# Patient Record
Sex: Female | Born: 1935
Health system: Southern US, Community
[De-identification: ages and names within clinical notes are randomized; demographics above are authoritative.]

## PROBLEM LIST (undated history)

## (undated) DIAGNOSIS — J449 Chronic obstructive pulmonary disease, unspecified: Secondary | ICD-10-CM

## (undated) DIAGNOSIS — I10 Essential (primary) hypertension: Secondary | ICD-10-CM

## (undated) DIAGNOSIS — F32A Depression, unspecified: Secondary | ICD-10-CM

## (undated) DIAGNOSIS — F329 Major depressive disorder, single episode, unspecified: Secondary | ICD-10-CM

## (undated) HISTORY — PX: VAGINAL HYSTERECTOMY: SUR661

---
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
## 1998-12-30 ENCOUNTER — Other Ambulatory Visit: Admission: RE | Admit: 1998-12-30 | Discharge: 1998-12-30 | Payer: Self-pay | Admitting: *Deleted

## 2000-01-08 ENCOUNTER — Encounter: Payer: Self-pay | Admitting: Family Medicine

## 2000-01-08 ENCOUNTER — Ambulatory Visit (HOSPITAL_COMMUNITY): Admission: RE | Admit: 2000-01-08 | Discharge: 2000-01-08 | Payer: Self-pay | Admitting: Family Medicine

## 2002-07-25 ENCOUNTER — Other Ambulatory Visit: Admission: RE | Admit: 2002-07-25 | Discharge: 2002-07-25 | Payer: Self-pay | Admitting: Family Medicine

## 2002-08-07 ENCOUNTER — Ambulatory Visit (HOSPITAL_COMMUNITY): Admission: RE | Admit: 2002-08-07 | Discharge: 2002-08-07 | Payer: Self-pay | Admitting: Family Medicine

## 2002-08-07 ENCOUNTER — Encounter: Payer: Self-pay | Admitting: Family Medicine

## 2004-08-10 ENCOUNTER — Ambulatory Visit (HOSPITAL_COMMUNITY): Admission: RE | Admit: 2004-08-10 | Discharge: 2004-08-10 | Payer: Self-pay | Admitting: Family Medicine

## 2005-01-04 ENCOUNTER — Ambulatory Visit (HOSPITAL_COMMUNITY): Admission: RE | Admit: 2005-01-04 | Discharge: 2005-01-04 | Payer: Self-pay | Admitting: Family Medicine

## 2005-05-30 ENCOUNTER — Other Ambulatory Visit: Admission: RE | Admit: 2005-05-30 | Discharge: 2005-05-30 | Payer: Self-pay | Admitting: Family Medicine

## 2008-03-27 ENCOUNTER — Emergency Department (HOSPITAL_COMMUNITY): Admission: EM | Admit: 2008-03-27 | Discharge: 2008-03-28 | Payer: Self-pay | Admitting: Emergency Medicine

## 2009-10-20 ENCOUNTER — Ambulatory Visit (HOSPITAL_COMMUNITY): Admission: RE | Admit: 2009-10-20 | Discharge: 2009-10-20 | Payer: Self-pay | Admitting: Orthopedic Surgery

## 2014-05-28 ENCOUNTER — Other Ambulatory Visit: Payer: Self-pay

## 2017-01-07 ENCOUNTER — Other Ambulatory Visit: Payer: Self-pay

## 2017-01-07 ENCOUNTER — Emergency Department (HOSPITAL_COMMUNITY)
Admission: EM | Admit: 2017-01-07 | Discharge: 2017-01-07 | Disposition: A | Discharge status: Home / Self Care | Payer: Medicare Other | Attending: Emergency Medicine | Admitting: Emergency Medicine

## 2017-01-07 ENCOUNTER — Emergency Department (HOSPITAL_COMMUNITY): Payer: Medicare Other

## 2017-01-07 ENCOUNTER — Encounter (HOSPITAL_COMMUNITY): Payer: Self-pay

## 2017-01-07 DIAGNOSIS — R079 Chest pain, unspecified: Secondary | ICD-10-CM

## 2017-01-07 DIAGNOSIS — Z87891 Personal history of nicotine dependence: Secondary | ICD-10-CM | POA: Insufficient documentation

## 2017-01-07 DIAGNOSIS — R059 Cough, unspecified: Secondary | ICD-10-CM

## 2017-01-07 DIAGNOSIS — R072 Precordial pain: Secondary | ICD-10-CM | POA: Insufficient documentation

## 2017-01-07 DIAGNOSIS — R0602 Shortness of breath: Secondary | ICD-10-CM | POA: Insufficient documentation

## 2017-01-07 DIAGNOSIS — R05 Cough: Secondary | ICD-10-CM | POA: Diagnosis not present

## 2017-01-07 DIAGNOSIS — Z79899 Other long term (current) drug therapy: Secondary | ICD-10-CM | POA: Diagnosis not present

## 2017-01-07 LAB — BASIC METABOLIC PANEL
ANION GAP: 6 (ref 5–15)
BUN: 15 mg/dL (ref 6–20)
CALCIUM: 10.2 mg/dL (ref 8.9–10.3)
CO2: 31 mmol/L (ref 22–32)
CREATININE: 0.59 mg/dL (ref 0.44–1.00)
Chloride: 104 mmol/L (ref 101–111)
GFR calc Af Amer: >60 mL/min (ref >60)
GLUCOSE: 102 mg/dL — AB (ref 65–99)
Potassium: 3.5 mmol/L (ref 3.5–5.1)
Sodium: 141 mmol/L (ref 135–145)

## 2017-01-07 LAB — CBC
HCT: 38.3 % (ref 36.0–46.0)
HEMOGLOBIN: 11.8 g/dL — AB (ref 12.0–15.0)
MCH: 27.3 pg (ref 26.0–34.0)
MCHC: 30.8 g/dL (ref 30.0–36.0)
MCV: 88.7 fL (ref 78.0–100.0)
Platelets: 293 10*3/uL (ref 150–400)
RBC: 4.32 MIL/uL (ref 3.87–5.11)
RDW: 13.8 % (ref 11.5–15.5)
WBC: 5.6 10*3/uL (ref 4.0–10.5)

## 2017-01-07 LAB — I-STAT TROPONIN, ED: TROPONIN I, POC: 0 ng/mL (ref 0.00–0.08)

## 2017-01-07 LAB — BRAIN NATRIURETIC PEPTIDE: B Natriuretic Peptide: 30.3 pg/mL (ref 0.0–100.0)

## 2017-01-07 MED ORDER — BENZONATATE 100 MG PO CAPS: 100.0000 mg | ORAL_CAPSULE | Freq: Three times a day (TID) | ORAL | 0 refills | Status: DC

## 2017-01-07 NOTE — ED Notes (Signed)
Patient transported to X-ray 

## 2017-01-07 NOTE — ED Triage Notes (Signed)
Pt. Has had  Non-productive cough and nasal congestion for over 1 week.   The last 2 days she has developed chest soreness and sob with exertion.  She denies any n/v/d.  Skin is warm and dry.  Pt. Denies any chills or fever   Lungs are diminished.   Pt. Is alert and oriented X4.  Pt. Is taking OTC medications not helping

## 2017-01-07 NOTE — Discharge Instructions (Signed)
May use tessalon for cough if needed. Follow-up with your primary care doctor. Return here for new or worsening symptoms including worsening pain, shortness of breath, high fever, weakness, etc.

## 2017-01-07 NOTE — ED Notes (Signed)
PA at bedside.

## 2017-01-07 NOTE — ED Provider Notes (Signed)
[facility] DEPT Provider Note   CSN: [id number] Arrival date & time: 01/07/17  1023     History   Chief Complaint Chief Complaint  Patient presents with  . Cough  . Chest Pain  . Shortness of Breath    HPI Chloe Wilcox is a 82 y.o. female.  The history is provided by the patient and medical records.  Cough  Associated symptoms include chest pain and shortness of breath.  Chest Pain   Associated symptoms include cough and shortness of breath.  Shortness of Breath  Associated symptoms include cough and chest pain.    82 year old female with history of hypertension, depression, presenting to the ED for cough and nasal congestion.  Patient reports this began about 1 week ago. States cough is productive first thing in the morning, but remains dry throughout the remainder of the day. She denies any nausea, vomiting, or posttussive emesis. No diarrhea. States over the past 2 days her chest has become "sore". States this is worse in the midsternal region without noted radiation. She denies any palpitations, dizziness, or weakness. Does report some mild shortness of breath with exertion. She's not had any falls or syncopal episodes. She remains ambulatory with her cane which is baseline. She's not had any sick contacts. She did receive a flu vaccination this year. She has been taking Coricidin cold and flu as well as Coricidin cough medication as to not increase her blood pressure.  Patient denies any weakness. States she has been able to eat and drink normally. States otherwise she feels fairly well.  She is a former smoker. No known cardiac disease.  Denies fever, chills, sweats.  VSS.  History reviewed. No pertinent past medical history.  There are no active problems to display for this patient.   History reviewed. No pertinent surgical history.  OB History    No data available       Home Medications    Prior to Admission medications   Medication Sig Start Date End Date  Taking? Authorizing Provider  cetirizine (ZYRTEC) 10 MG tablet Take 10 mg by mouth daily.   Yes Historical Provider, MD  citalopram (CELEXA) 20 MG tablet Take 20 mg by mouth daily.   Yes Historical Provider, MD  diclofenac (VOLTAREN) 75 MG EC tablet Take 75 mg by mouth 2 (two) times daily.   Yes Historical Provider, MD  lisinopril-hydrochlorothiazide (PRINZIDE,ZESTORETIC) 20-25 MG tablet Take 1 tablet by mouth daily.   Yes Historical Provider, MD  Multiple Vitamin (MULTIVITAMIN) tablet Take 1 tablet by mouth daily.   Yes Historical Provider, MD  oxyCODONE-acetaminophen (PERCOCET/ROXICET) 5-325 MG tablet Take 1 tablet by mouth 2 (two) times daily as needed for moderate pain.  01/02/17  Yes Historical Provider, MD  potassium chloride (MICRO-K) 10 MEQ CR capsule Take 10 mEq by mouth daily.   Yes Historical Provider, MD  tiZANidine (ZANAFLEX) 2 MG tablet Take 2 mg by mouth every 6 (six) hours as needed for muscle spasms.   Yes Historical Provider, MD  traMADol (ULTRAM) 50 MG tablet Take 50 mg by mouth daily as needed for moderate pain.   Yes Historical Provider, MD    Family History No family history on file.  Social History Social History  Substance Use Topics  . Smoking status: Former Research scientist (life sciences)  . Smokeless tobacco: Never Used  . Alcohol use No     Allergies   Patient has no known allergies.   Review of Systems Review of Systems  HENT: Positive for congestion.  Respiratory: Positive for cough and shortness of breath.   Cardiovascular: Positive for chest pain.  All other systems reviewed and are negative.    Physical Exam Updated Vital Signs BP 174/73 (BP Location: Right Arm)   Pulse 82   Temp 99.1 F (37.3 C) (Oral)   Resp 19   Ht 5\' 5"  (1.651 m)   Wt 73.9 kg   SpO2 95%   BMI 27.12 kg/m   Physical Exam  Constitutional: She is oriented to person, place, and time. She appears well-developed and well-nourished.  Appears well, interactive during exam, no distress  HENT:    Head: Normocephalic and atraumatic.  Right Ear: Tympanic membrane and ear canal normal.  Left Ear: Tympanic membrane and ear canal normal.  Nose: Mucosal edema present.  Mouth/Throat: Uvula is midline, oropharynx is clear and moist and mucous membranes are normal.  + nasal congestion with PND  Eyes: Conjunctivae and EOM are normal. Pupils are equal, round, and reactive to light.  Right upper eyelid ptosis, baseline  Neck: Normal range of motion.  Cardiovascular: Normal rate, regular rhythm and normal heart sounds.   Pulmonary/Chest: Effort normal and breath sounds normal. She has no wheezes. She has no rhonchi. She has no rales.  Lung sounds overall clear, no distress, speaking in full sentences without difficulty  Abdominal: Soft. Bowel sounds are normal.  Musculoskeletal: Normal range of motion.  Neurological: She is alert and oriented to person, place, and time.  Skin: Skin is warm and dry.  Psychiatric: She has a normal mood and affect.  Nursing note and vitals reviewed.    ED Treatments / Results  Labs (all labs ordered are listed, but only abnormal results are displayed) Labs Reviewed  BASIC METABOLIC PANEL - Abnormal; Notable for the following:       Result Value   Glucose, Bld 102 (*)    All other components within normal limits  CBC - Abnormal; Notable for the following:    Hemoglobin 11.8 (*)    All other components within normal limits  BRAIN NATRIURETIC PEPTIDE  I-STAT TROPOININ, ED    EKG  EKG Interpretation  Date/Time:  Saturday January 07 2017 11:32:16 EST Ventricular Rate:  74 PR Interval:  166 QRS Duration: 82 QT Interval:  374 QTC Calculation: 415 R Axis:   44 Text Interpretation:  Normal sinus rhythm Nonspecific T wave abnormality Abnormal ECG No old tracing to compare Confirmed by [facility]  MD, MARTHA [phone]) on 01/07/2017 11:53:02 AM       Radiology Dg Chest 2 View  Result Date: 01/07/2017 CLINICAL DATA:  Center chest pain/discomfort x3 days.  Dry cough x1 week. Hx of HTN. Ex-smoker, quit x30 years ago. EXAM: CHEST - 2 VIEW COMPARISON:  none FINDINGS: Lungs are clear.  No pneumothorax. Heart size and mediastinal contours are within normal limits. Atheromatous aorta. No effusion.  Focal diaphragmatic eventrations. Visualized bones unremarkable. IMPRESSION: No acute cardiopulmonary disease. Electronically Signed   By: Lucrezia Europe M.D.   On: 01/07/2017 12:25    Procedures Procedures (including critical care time)  Medications Ordered in ED Medications - No data to display   Initial Impression / Assessment and Plan / ED Course  I have reviewed the triage vital signs and the nursing notes.  Pertinent labs & imaging results that were available during my care of the patient were reviewed by me and considered in my medical decision making (see chart for details).  82 y.o. F here with Cough, congestion x 1 weeks with  new, constant chest "soreness" x 2 days.  She is afebrile and nontoxic. She is in no acute distress on exam. She does have some tenderness of the chest wall along the midsternal region. There is no gross deformity. Lungs are clear without wheezes or rhonchi.  EKG without acute ischemic changes.  Lab work reassuring.  CXR clear.  Patient has remained stable here.  Symptoms sound more related to URI.  Given 2 days of ongoing chest "soreness" with negative enzymes, low suspicion for ACS, PE, dissection, or other acute cardiac event.  Will plan to discharge home with symptomatic care.  Close PCP follow-up encouraged.  Discussed plan with patient and daughter, they both acknowledged understanding and agreed with plan of care.  Return precautions given for new or worsening symptoms.  Case discussed with attending physician, Dr. Canary Brim, who evaluated patient and agrees with assessment and plan of care.  Final Clinical Impressions(s) / ED Diagnoses   Final diagnoses:  Cough  Chest pain, unspecified type    New Prescriptions New  Prescriptions   BENZONATATE (TESSALON) 100 MG CAPSULE    Take 1 capsule (100 mg total) by mouth every 8 (eight) hours.     Larene Pickett, PA-C 01/07/17 [place], MD 01/07/17 [phone]

## 2017-07-25 ENCOUNTER — Other Ambulatory Visit: Payer: Self-pay | Admitting: Family Medicine

## 2017-07-25 DIAGNOSIS — R198 Other specified symptoms and signs involving the digestive system and abdomen: Secondary | ICD-10-CM

## 2017-08-02 ENCOUNTER — Ambulatory Visit
Admission: RE | Admit: 2017-08-02 | Discharge: 2017-08-02 | Disposition: A | Discharge status: Home / Self Care | Payer: Medicare Other | Source: Ambulatory Visit | Attending: Family Medicine | Admitting: Family Medicine

## 2017-08-02 DIAGNOSIS — R198 Other specified symptoms and signs involving the digestive system and abdomen: Secondary | ICD-10-CM

## 2017-08-02 MED ORDER — IOPAMIDOL (ISOVUE-300) INJECTION 61%
100.0000 mL | Freq: Once | INTRAVENOUS | Status: AC | PRN
  Administered 2017-08-02: 100 mL via INTRAVENOUS

## 2018-06-06 ENCOUNTER — Other Ambulatory Visit: Payer: Self-pay | Admitting: Family Medicine

## 2018-06-06 ENCOUNTER — Ambulatory Visit
Admission: RE | Admit: 2018-06-06 | Discharge: 2018-06-06 | Disposition: A | Discharge status: Home / Self Care | Payer: Medicare Other | Source: Ambulatory Visit | Attending: Family Medicine | Admitting: Family Medicine

## 2018-06-06 DIAGNOSIS — R0602 Shortness of breath: Secondary | ICD-10-CM

## 2018-08-09 ENCOUNTER — Ambulatory Visit: Payer: Medicare Other | Admitting: Physical Therapy

## 2018-08-09 ENCOUNTER — Encounter: Payer: Self-pay | Admitting: Physical Therapy

## 2018-08-09 ENCOUNTER — Other Ambulatory Visit: Payer: Self-pay

## 2018-08-09 ENCOUNTER — Ambulatory Visit: Payer: Medicare Other | Attending: Family Medicine | Admitting: Speech Pathology

## 2018-08-09 DIAGNOSIS — R2681 Unsteadiness on feet: Secondary | ICD-10-CM | POA: Insufficient documentation

## 2018-08-09 DIAGNOSIS — R41841 Cognitive communication deficit: Secondary | ICD-10-CM | POA: Diagnosis present

## 2018-08-09 DIAGNOSIS — R2689 Other abnormalities of gait and mobility: Secondary | ICD-10-CM

## 2018-08-09 DIAGNOSIS — M6281 Muscle weakness (generalized): Secondary | ICD-10-CM

## 2018-08-09 DIAGNOSIS — R4701 Aphasia: Secondary | ICD-10-CM | POA: Diagnosis present

## 2018-08-09 DIAGNOSIS — R471 Dysarthria and anarthria: Secondary | ICD-10-CM | POA: Diagnosis present

## 2018-08-09 NOTE — Patient Instructions (Signed)
Abdominal Breathing   . Lie down flat on your bed (you can use a pillow if you need to. The goal is for you to be relaxed) . Put something lightweight on your belly (a paperback book, a paper cup, a box of tissues, etc) - this helps you focus on easy abdominal breath support - the best and most relaxed way to breathe . Breathe naturally and pay attention to how your belly rises as you breathe in and your lungs fill with air Breathe out through your mouth and watch your belly move in/down as you let the air go.  Think of your belly as a balloon, when you fill with air (inhale), the balloon gets bigger. As the air goes out (exhale), the balloon deflates.  Spend 10-15 minutes each day watching your breathing while you are in this position.  Later, we will work on using your breathing to make your voice stronger.    Please bring the exercises from your speech therapist at the hospital with you the next time you come.

## 2018-08-09 NOTE — Therapy (Signed)
[facility] [address] [place], [place], [address] Phone: [phone]   Fax:  [phone]  Speech Language Pathology Evaluation  Patient Details  Name: Chloe Wilcox MRN: [id number] Date of Birth: Jan 22, 1935 Referring Provider: [name]   Encounter Date: 08/09/2018  End of Session - 08/09/18 1311    Visit Number  1    Number of Visits  9    Date for SLP Re-Evaluation  09/27/18    SLP Start Time  25    SLP Stop Time   1100    SLP Time Calculation (min)  41 min    Activity Tolerance  Patient tolerated treatment well       No past medical history on file.  No past surgical history on file.  There were no vitals filed for this visit.  Subjective Assessment - 08/09/18 1022    Subjective  "I notice I talk a little different... It's a little sl-ll-slo- sloler, I'm trying to say slower."    Patient is accompained by:  Family member   daughter [name]   Currently in Pain?  No/denies         SLP Evaluation [facility] - 08/09/18 1022      SLP Visit Information   SLP Received On  08/09/18    Referring Provider  Shirline Frees K    Onset Date  07/22/18    Medical Diagnosis  CVA      Subjective   Subjective  alert, cooperative    Patient/Family Stated Goal  for people to understand what i'm trying to say      General Information   HPI  83 y.o. female, hx of right eye ptosis, admitted to hospital in [place], [place] for CVA 07/22/18-07/25/18. MRI results not available. Pt with resultant right sided weakness and reportedly drooling, speech difficulties.     Behavioral/Cognition  alert, cooperative      Balance Screen   Has the patient fallen in the past 6 months  --   See today's PT eval     Prior Functional Status   Cognitive/Linguistic Baseline  Baseline deficits   mild cognitive deficits, unchanged per daughter   Type of [place] With  Daughter    Available Support  Family    Vocation   Retired      Associate Professor   Overall Cognitive Status  History of cognitive impairments - at baseline      Auditory Comprehension   Overall Auditory Comprehension  Appears within functional limits for tasks assessed    Yes/No Questions  Within Functional Limits    Commands  Within Functional Limits    Conversation  Moderately complex    Interfering Components  Hearing      Visual Recognition/Discrimination   Discrimination  Not tested      Reading Comprehension   Reading Status  Within funtional limits   sentences, simple paragraph (9th grade education)     Expression   Primary Mode of Expression  Verbal      Verbal Expression   Overall Verbal Expression  Impaired at baseline   circumlocution with naming tasks, none in conversation     Written Expression   Dominant Hand  Right    Written Expression  Not tested      Oral Motor/Sensory Function   Overall Oral Motor/Sensory Function  Impaired    Labial ROM  Reduced right    Labial Symmetry  Abnormal symmetry right  Labial Strength  Within Functional Limits    Labial Sensation  Within Functional Limits    Labial Coordination  WFL    Lingual ROM  Within Functional Limits    Lingual Symmetry  Within Functional Limits    Lingual Strength  Within Functional Limits    Lingual Coordination  WFL    Facial ROM  Reduced right    Facial Symmetry  Right droop    Facial Strength  Within Functional Limits    Facial Sensation  Within Functional Limits    Velum  Within Functional Limits    Mandible  Within Functional Limits      Motor Speech   Overall Motor Speech  Impaired    Respiration  Impaired    Level of Impairment  Word    Phonation  Hoarse;Low vocal intensity;Other (comment)   gravelly, rough, diplophonia   Resonance  Within functional limits    Articulation  Impaired    Level of Impairment  Conversation    Intelligibility  Intelligible    Motor Planning  Impaired    Level of Impairment  Sentence    Motor Speech Errors   Groping for words;Inconsistent    Effective Techniques  Slow rate    Phonation  Impaired    Volume  Soft    Pitch  Low      Standardized Assessments   Standardized Assessments   Other Assessment    Other Assessment  Sustained /a/ reduced at average 4 seconds duration, with pitch breaks/diplophonia noted.                       SLP Education - 08/09/18 1311    Education Details  abdominal breathing, proposed therapy goals    Person(s) Educated  Patient;Child(ren)    Methods  Explanation;Demonstration;Verbal cues;Handout    Comprehension  Verbalized understanding;Returned demonstration;Verbal cues required;Tactile cues required;Need further instruction         SLP Long Term Goals - 08/09/18 1328      SLP LONG TERM GOAL #1   Title  Pt will demo abdominal breathing at rest 80% accuracy, rare min A.     Baseline  Max A in tandem breathing     Time  4    Period  Weeks    Status  New      SLP LONG TERM GOAL #2   Title  Pt will use abdominal breathing 80% accuracy when responding with sentences 18/20 over 3 sessions.    Time  4    Period  Weeks    Status  New      SLP LONG TERM GOAL #3   Title  Pt will use abdominal breathing in 7 min simple conversation to reduce hoarse vocal quality, 80% accuracy with occasional min A.     Time  4    Period  Weeks      SLP LONG TERM GOAL #4   Title  Pt/caregiver will report fewer requests for pt to repeat herself.     Time  4    Period  Weeks    Status  New       Plan - 08/09/18 1313    Clinical Impression Statement  Patient presents with moderate dysarthria characterized by hoarse, gravelly vocal quality with intermittent pitch breaks, diplophonia and mild slurring. Pt's intelligibility is impacted primarily by her vocal quality and decreased vocal intensity, which was difficult to measure accurately given louder, high-pitched voice breaks. She has mild right facial  weakness, and reports some drooling but feels this has  mostly resolved. Intelligibility was not affected in this quiet environment, however pt reports decreased quality of life, stating others cannot understand her when she speaks. Her breathing pattern is shallow, with chest and clavicular-centered respiration. SLP attempted intensity-based cuing to determine if this would improve pt's vocal quality, however this resulted in strained-harsh vocal quality. Question mild verbal apraxia as pt was noted with articulatory breakdown/groping attempts x3 in utterances of longer complexity (see "S"). Testing for non-verbal oral apraxia revealed normal movements. Pt's daughter present and denies changes in pt's language or cognitive abilities; they are most concerned about her vocal quality and would like to target this in therapy. I suspect mild baseline cognitive deficits; confrontation naming is mildly impaired (circumlocution), though there are no wordfinding difficulties in conversation. I recommend skilled ST to address dysarthria in order to improve pt's vocal quality, ability to communicate at home and in the community.     Speech Therapy Frequency  2x / week    Duration  4 weeks    Treatment/Interventions  Compensatory strategies;Oral motor exercises;SLP instruction and feedback;Patient/family education;Functional tasks;Cueing hierarchy;Compensatory techniques    Potential to Achieve Goals  Fair    Potential Considerations  Severity of impairments;Ability to learn/carryover information;Financial resources   pt wishes to schedule only 4 weeks at this time d/t financial concerns   SLP Home Exercise Plan  abdominal breathing provided. pt to bring exercises given by hospital ST to next session    Consulted and Agree with Plan of Care  Patient;Family member/caregiver       Patient will benefit from skilled therapeutic intervention in order to improve the following deficits and impairments:   Dysarthria and anarthria  Aphasia  Cognitive communication  deficit    Problem List There are no active problems to display for this patient.  Deneise Lever, [place], CCC-SLP Speech-Language Pathologist  Aliene Altes 08/09/2018, 1:29 PM  [facility] [address] [place], [place], [address] Phone: [phone]   Fax:  [phone]  Name: Chloe Wilcox MRN: [id number] Date of Birth: [birth year]

## 2018-08-10 ENCOUNTER — Ambulatory Visit: Payer: Medicare Other | Admitting: *Deleted

## 2018-08-10 ENCOUNTER — Encounter: Payer: Self-pay | Admitting: Physical Therapy

## 2018-08-10 NOTE — Therapy (Signed)
[facility] [address] [place], [place], [address] Phone: [phone]   Fax:  [phone]  Physical Therapy Evaluation  Patient Details  Name: RONEE RANGANATHAN MRN: [id number] Date of Birth: Apr 20, 1978 Referring Provider: Shirline Frees, MD   Encounter Date: 08/09/2018  PT End of Session - 08/10/18 0812    Visit Number  1    Number of Visits  9    Date for PT Re-Evaluation  10/08/18    Authorization Type  [employer] Medicare-will need 10th visit progress note    PT Start Time  0936    PT Stop Time  1017    PT Time Calculation (min)  41 min    Activity Tolerance  Patient tolerated treatment well    Behavior During Therapy  [facility] for tasks assessed/performed       History reviewed. No pertinent past medical history.  History reviewed. No pertinent surgical history.  There were no vitals filed for this visit.   Subjective Assessment - 08/09/18 0941    Subjective  Pt was visiting in [place] with sister, and two weeks ago-realized she wasn't walking right and talking funny.  She went to hospital, and had CVA with R sided weakness.  She feels like she has made improvement.  She has rollator, which she uses primarily for long distance community walking; otherwise, she was using cane.  Daughter feels she's about 65% to normal..    Patient is accompained by:  Family member   Daughter   Pertinent History  PMH on HTN, OA, CVA 07/22/18    Patient Stated Goals  Pt's goals for therapy are to get less stiffness, get stiff back and build strength in R side.  Daughter wants her to continue to be as mobile as possible.    Currently in Pain?  No/denies         [facility] PT Assessment - 08/09/18 0948      Assessment   Medical Diagnosis  CVA    Referring Provider  Shirline Frees, MD    Onset Date/Surgical Date  07/22/18    Hand Dominance  Left      Precautions   Precautions  Fall      Balance Screen   Has the patient fallen in  the past 6 months  No    Has the patient had a decrease in activity level because of a fear of falling?   Yes    Is the patient reluctant to leave their home because of a fear of falling?   Yes      [place]  Private residence    Living Arrangements  Children    Available Help at Discharge  Family    Type of [place] to enter    Entrance Stairs-Number of Steps  [place]  One level    [place] - 4 wheels;Kasandra Knudsen - single point      Prior Function   Level of Independence  Independent    Leisure  Enjoys shopping, baking, adult coloring books      Observation/Other Assessments   Focus on Therapeutic Outcomes (FOTO)   Staff did not capture-NP      Sensation   Light Touch  Appears Intact      Posture/Postural Control   Posture/Postural Control  Postural limitations    Postural Limitations  Rounded Shoulders      ROM / Strength   AROM / PROM / Strength  Strength      Strength   Overall Strength  Deficits    Overall Strength Comments  Grossly tested 4/5 thorughout LLE, 3+/5 through RLE      Transfers   Transfers  Sit to Stand;Stand to Sit    Sit to Stand  6: Modified independent (Device/Increase time);With upper extremity assist;From chair/3-in-1    Stand to Sit  6: Modified independent (Device/Increase time);With upper extremity assist;To chair/3-in-1      Ambulation/Gait   Ambulation/Gait  Yes    Ambulation/Gait Assistance  5: Supervision    Ambulation/Gait Assistance Details  PT lowered pt's rollator walker, as it was significantly too high for her.  Patient tends to leave rollator and walk 2-3 ft to sit in chair.  She does not lock brakes to rollator (unaware of how to properly use rollator)    Ambulation Distance (Feet)  80 Feet    Assistive device  4-wheeled walker    Gait Pattern  Step-through pattern;Trunk flexed;Decreased step length - right;Decreased step  length - left    Ambulation Surface  Level;Indoor    Gait velocity  21.12 sec = 1.55 ft/sec      Standardized Balance Assessment   Standardized Balance Assessment  Timed Up and Go Test      Timed Up and Go Test   Normal TUG (seconds)  35.22    TUG Comments  Scores > 13.5 seconds indicate increased fall risk; >30 seconds indicate difficulty with ADLs in the home.                Objective measurements completed on examination: See above findings.              PT Education - 08/10/18 0811    Education Details  Lowered rollator to more appropriate height for patient; instructed patient/daughter in proper use of rollator for turning to sit, for locking and unlocking brakes    Person(s) Educated  Patient;Child(ren)    Methods  Explanation;Demonstration;Handout    Comprehension  Verbalized understanding;Returned demonstration;Verbal cues required          PT Long Term Goals - 08/10/18 8341      PT LONG TERM GOAL #1   Title  Pt will perform HEP with family's supervision, for improved balance, strength, and functional mobility.  TARGET 09/07/18    Time  5    Period  Weeks    Status  New    Target Date  09/07/18      PT LONG TERM GOAL #2   Title  Pt will improve TUG score to less than or equal to 25 seconds for decreased fall risk.    Time  5    Period  Weeks    Status  New    Target Date  09/07/18      PT LONG TERM GOAL #3   Title  Pt will improve gait velocity to at least 1.8 ft/sec for decreased fall risk, improved gait efficiency.    Time  5    Period  Weeks    Status  New    Target Date  09/07/18      PT LONG TERM GOAL #4   Title  Berg Balance to be assessed, with goal to be written as appropriate.    Time  5    Period  Weeks    Status  New    Target Date  09/07/18      PT LONG TERM GOAL #5   Title  Pt will ambulate at least 500 ft using rollator, modified independnetly, for improved community distance gait.    Time  5    Period  Weeks     Status  New    Target Date  09/07/18      Additional Long Term Goals   Additional Long Term Goals  Yes      PT LONG TERM GOAL #6   Title  Pt will ambulate at least 150 ft using single point cane, modified independently, for improved gait for household distances.    Time  5    Period  Weeks    Status  New    Target Date  09/07/18             Plan - 08/10/18 0813    Clinical Impression Statement  Pt is an 83 year old female who presents to OP PT, with recent history of CVA 07/22/18 when she was visiting family in [place].  Pt presents with R sided weakness, speech issues, decreased balance, slowed mobility, risk of falls per TUG and gait velocity scores.  She is currently using rollator walker (which she had at home, but never used), and she was using cane prior to CVA.  She enjoys shopping and baking.  Pt would benefit from skilled PT to address the above stated deficits to decrease fall risk, to improve mobility and independence to prior level of function.    History and Personal Factors relevant to plan of care:  PMH includes HTN, OA, drooping of eyelid (to require surgery per pt report); recent CVA with muscle weakness, decreased balance, decreased independence with mobility    Clinical Presentation  Evolving    Clinical Presentation due to:  Recent CVA with R sided weakness, at fall risk per TUG and gait velocity scores    Clinical Decision Making  Moderate    Rehab Potential  Good    PT Frequency  2x / week    PT Duration  4 weeks   plus eval visit   PT Treatment/Interventions  ADLs/Self Care Home Management;DME Instruction;Gait training;Functional mobility training;Therapeutic activities;Therapeutic exercise;Balance training;Neuromuscular re-education;Patient/family education    PT Next Visit Plan  Perform Berg and add to LTG (not able to do during eval, due to education on walker safety); initiate HEP for strength, balance    Recommended Other Services  Discussed recommendation for  OT to address RUE weakness and coordination; pt/daughter did not yet decide about OT    Consulted and Agree with Plan of Care  Patient;Family member/caregiver    Family Member Consulted  daughter       Patient will benefit from skilled therapeutic intervention in order to improve the following deficits and impairments:  Abnormal gait, Decreased balance, Decreased knowledge of use of DME, Decreased mobility, Difficulty walking, Decreased strength, Decreased safety awareness  Visit Diagnosis: Other abnormalities of gait and mobility  Muscle weakness (generalized)  Unsteadiness on feet     Problem List There are no active problems to display for this patient.   Mashelle Busick W. 08/10/2018, 8:30 AM Frazier Butt., PT  [facility] [address] [place], [place], [address] Phone: [phone]   Fax:  [phone]  Name: SHAMA MONFILS MRN: [id number] Date of Birth: July 26, 1935

## 2018-08-14 ENCOUNTER — Encounter: Payer: Self-pay | Admitting: Physical Therapy

## 2018-08-14 ENCOUNTER — Encounter: Payer: Self-pay | Admitting: Speech Pathology

## 2018-08-14 ENCOUNTER — Ambulatory Visit: Payer: Medicare Other | Admitting: Speech Pathology

## 2018-08-14 ENCOUNTER — Ambulatory Visit: Payer: Medicare Other | Admitting: Physical Therapy

## 2018-08-14 DIAGNOSIS — R2681 Unsteadiness on feet: Secondary | ICD-10-CM

## 2018-08-14 DIAGNOSIS — R2689 Other abnormalities of gait and mobility: Secondary | ICD-10-CM

## 2018-08-14 DIAGNOSIS — R471 Dysarthria and anarthria: Secondary | ICD-10-CM | POA: Diagnosis not present

## 2018-08-14 DIAGNOSIS — M6281 Muscle weakness (generalized): Secondary | ICD-10-CM

## 2018-08-14 NOTE — Therapy (Signed)
[facility] [address] [place], [place], [address] Phone: [phone]   Fax:  [phone]  Physical Therapy Evaluation  Patient Details  Name: Chloe Wilcox MRN: [id number] Date of Birth: 05/06/1935 Referring Provider: Shirline Frees, MD   Encounter Date: 08/14/2018  PT End of Session - 08/14/18 0845    Visit Number  2    Number of Visits  9    Date for PT Re-Evaluation  10/08/18    Authorization Type  [employer] Medicare-will need 10th visit progress note    PT Start Time  0836    PT Stop Time  0920    PT Time Calculation (min)  44 min    Activity Tolerance  Patient tolerated treatment well    Behavior During Therapy  [facility] for tasks assessed/performed       History reviewed. No pertinent past medical history.  History reviewed. No pertinent surgical history.  There were no vitals filed for this visit.   Subjective Assessment - 08/14/18 0838    Subjective  Went back to [facility] and came back yesterday; has been using her "stick", has been using rollator in community; feels safe with rollator but reports she wants to learn how to use it    Patient is accompained by:  Family member   cousin brought her back   Pertinent History  PMH on HTN, OA, CVA 07/22/18    Patient Stated Goals  Pt's goals for therapy are to get less stiffness, get stiff back and build strength in R side.  Daughter wants her to continue to be as mobile as possible.    Currently in Pain?  No/denies                    Objective measurements completed on examination: See above findings.      [facility] Adult PT Treatment/Exercise - 08/14/18 0846      Ambulation/Gait   Ambulation/Gait  Yes    Ambulation/Gait Assistance  5: Supervision    Ambulation/Gait Assistance Details  PT educating on safety with AD - use of brakes and remaining close to AD. Patient continues to pull up from AD and leave rollator 2-3 ft away from seated surface    Ambulation  Distance (Feet)  --   3 laps around PT gym   Assistive device  4-wheeled walker    Gait Pattern  Step-through pattern;Trunk flexed;Decreased step length - right;Decreased step length - left    Ambulation Surface  Level;Indoor    Gait Comments  poor safety awareness with device even with max cueing and education throughout session      Standardized Balance Assessment   Standardized Balance Assessment  Berg Balance Test      Berg Balance Test   Sit to Stand  Able to stand without using hands and stabilize independently    Standing Unsupported  Able to stand safely 2 minutes    Sitting with Back Unsupported but Feet Supported on Floor or Stool  Able to sit safely and securely 2 minutes    Stand to Sit  Uses backs of legs against chair to control descent    Transfers  Able to transfer safely, minor use of hands    Standing Unsupported with Eyes Closed  Able to stand 10 seconds with supervision    Standing Ubsupported with Feet Together  Able to place feet together independently and stand for 1 minute with supervision    From Standing, Reach Forward with Outstretched  Arm  Can reach forward >5 cm safely (2")    From Standing Position, Pick up Object from [place] to pick up shoe, needs supervision    From Standing Position, Turn to Look Behind Over each Shoulder  Turn sideways only but maintains balance    Turn 360 Degrees  Able to turn 360 degrees safely but slowly    Standing Unsupported, Alternately Place Feet on Step/Stool  Able to stand independently and complete 8 steps >20 seconds    Standing Unsupported, One Foot in [employer] balance while stepping or standing    Standing on One Leg  Tries to lift leg/unable to hold 3 seconds but remains standing independently    Total Score  37          Balance Exercises - 08/14/18 0857      Balance Exercises: Standing   Standing Eyes Opened  Narrow base of support (BOS);Head turns;2 reps;30 secs    Tandem Stance  Eyes open;Upper  extremity support 1;2 reps;20 secs   bilateral   Sit to Stand Time  12 reps from mat table - 1 UE support on thigh        PT Education - 08/14/18 1031    Education Details  Education on proper use of rollator - brakes and general setup - brake line was caught bolt with rollator in a bent position rather than fully extended. Education on how to safety sit on device with use of brakes and/or wall for safety.    Person(s) Educated  Patient    Methods  Explanation;Demonstration    Comprehension  Verbalized understanding;Need further instruction      Exercises (HEP) Sit to Stand - 10 reps - 2 sets - 1x daily - 7x weekly  Tandem Stance with Support - 5 reps - 2 sets - 10-30 hold - 1x daily - 7x weekly  Narrow Stance with Head Rotations and Counter Support - 5 reps - 2 sets - 10-20 hold - 1x daily - 7x weekly  Narrow Stance with Head Nods and Counter Support - 5 reps - 2 sets - 10-20 hold - 1x daily - 7x weekly    PT Long Term Goals - 08/14/18 1610      PT LONG TERM GOAL #1   Title  Pt will perform HEP with family's supervision, for improved balance, strength, and functional mobility.  TARGET 09/07/18    Time  5    Period  Weeks    Status  On-going      PT LONG TERM GOAL #2   Title  Pt will improve TUG score to less than or equal to 25 seconds for decreased fall risk.    Time  5    Period  Weeks    Status  On-going      PT LONG TERM GOAL #3   Title  Pt will improve gait velocity to at least 1.8 ft/sec for decreased fall risk, improved gait efficiency.    Time  5    Period  Weeks    Status  On-going      PT LONG TERM GOAL #4   Title  Patient to improve Berg Balance to >/= 41/56 to demonstrate reduced fall risk    Baseline  08/14/18: 37/56     Time  5    Period  Weeks    Status  New      PT LONG TERM GOAL #5   Title  Pt will ambulate at least  500 ft using rollator, modified independnetly, for improved community distance gait.    Time  5    Period  Weeks    Status  On-going       PT LONG TERM GOAL #6   Title  Pt will ambulate at least 150 ft using single point cane, modified independently, for improved gait for household distances.    Time  5    Period  Weeks    Status  On-going             Plan - 08/14/18 0845    Clinical Impression Statement  Patient presenting with rollator - noted poor safety awareness with device with patient not correctly using brakes and with increased standing/walking distance from device. Much of time spent on educating safety with rollator as well as set-up as brake line was limiting full extended position of device, as well as safety with sitting on device. Limited carryover noted with education - will likely require further education. Assessed Merrilee Jansky today with patient scoring 37/56 noting higher fall risk with difficulty following one-step commands. Initiated HEP today for strengthening and balance with emphasis on safety within the home. Will continue to progress towards goals.     Rehab Potential  Good    PT Frequency  2x / week    PT Duration  4 weeks   plus eval visit   PT Treatment/Interventions  ADLs/Self Care Home Management;DME Instruction;Gait training;Functional mobility training;Therapeutic activities;Therapeutic exercise;Balance training;Neuromuscular re-education;Patient/family education    PT Next Visit Plan  continues strengthening and balance; may require furhter education on safety with rollator; HEP [id number]    Consulted and Agree with Plan of Care  Patient;Family member/caregiver    Family Member Consulted  daughter       Patient will benefit from skilled therapeutic intervention in order to improve the following deficits and impairments:  Abnormal gait, Decreased balance, Decreased knowledge of use of DME, Decreased mobility, Difficulty walking, Decreased strength, Decreased safety awareness  Visit Diagnosis: Other abnormalities of gait and mobility  Muscle weakness (generalized)  Unsteadiness on  feet     Problem List There are no active problems to display for this patient.   Lanney Gins, PT, DPT Supplemental Physical Therapist 08/14/18 11:57 AM Pager: [phone] Office: [place] [facility] [address] [place], [place], [address] Phone: [phone]   Fax:  [phone]  Name: Chloe Wilcox MRN: [id number] Date of Birth: [birth year]

## 2018-08-14 NOTE — Therapy (Signed)
[facility] [address] [place], [place], [address] Phone: [phone]   Fax:  [phone]  Speech Language Pathology Treatment  Patient Details  Name: BRIDGID PRINTZ MRN: [id number] Date of Birth: [birth year] Referring Provider: [name]   Encounter Date: 08/14/2018  End of Session - 08/14/18 1023    Visit Number  2    Number of Visits  9    Date for SLP Re-Evaluation  09/27/18    SLP Start Time  0934    SLP Stop Time   8527    SLP Time Calculation (min)  40 min    Activity Tolerance  Patient tolerated treatment well       History reviewed. No pertinent past medical history.  History reviewed. No pertinent surgical history.  There were no vitals filed for this visit.  Subjective Assessment - 08/14/18 1019    Subjective  "I think my voice is a little bit better"    Currently in Pain?  No/denies            ADULT SLP TREATMENT - 08/14/18 0939      General Information   Behavior/Cognition  Alert;Cooperative;Pleasant mood      Treatment Provided   Treatment provided  Cognitive-Linquistic      Pain Assessment   Pain Assessment  No/denies pain      Cognitive-Linquistic Treatment   Treatment focused on  Dysarthria    Skilled Treatment  Pt with mild hoarse, gravelly vocal quality reporting her voice is "better than it was"; no high pitched vocal breaks were noted during this session as reported during eval. Abdominal breathing (AB) exercises for 30 minutes with noted difficulty following instructions. Some improvement with one hand on abdomen and one on the chest with max verbal cues to utilize AB vs clavicular breathing. Coordinated phonation with respiration with single word utterance with Max A. AB improved with hard inspiration and "sh" on exhale. Mild drooling on R noted during conversation; education provided to increase swallowing frequency.       Assessment / Recommendations / Plan   Plan   Continue with current plan of care      Progression Toward Goals   Progression toward goals  Progressing toward goals       SLP Education - 08/14/18 1022    Education Details  abdominal breathing and increased swallowing to decreased drooling         SLP Long Term Goals - 08/14/18 1041      SLP LONG TERM GOAL #1   Title  Pt will demo abdominal breathing at rest 80% accuracy, rare min A.     Baseline  Max A in tandem breathing     Time  3    Period  Weeks    Status  On-going      SLP LONG TERM GOAL #2   Title  Pt will use abdominal breathing 80% accuracy when responding with sentences 18/20 over 3 sessions.    Time  3    Period  Weeks    Status  On-going      SLP LONG TERM GOAL #3   Title  Pt will use abdominal breathing in 7 min simple conversation to reduce hoarse vocal quality, 80% accuracy with occasional min A.     Time  3    Period  Weeks      SLP LONG TERM GOAL #4   Title  Pt/caregiver will report fewer requests for pt to  repeat herself.     Time  3    Period  Weeks    Status  New       Plan - 08/14/18 1038    Clinical Impression Statement  Overall improvement in vocal quality since initial visit however note continued mild/mod hoarse, gravelly vocal quality. Pt using abdominal breathing (AB) hard inhalation with "sh" on expiration requiring Max A with minimal improvement with one hand on abdomen and one on chest to increase awareness of AB. Continue skilled ST to improve pt's vocal quality and ability to communicate. Pt's daughter present and denies changes in pt's language or cognitive abilities; they are most concerned about her vocal quality and would like to target this in therapy. I suspect baseline cognitive deficits; confrontation naming is mildly impaired (circumlocution), though there are no wordfinding difficulties in conversation. I recommend skilled ST to address dysarthria in order to improve pt's vocal quality, ability to communicate at home and in the  community.     Speech Therapy Frequency  2x / week    Duration  4 weeks    Treatment/Interventions  Compensatory strategies;Oral motor exercises;SLP instruction and feedback;Patient/family education;Functional tasks;Cueing hierarchy;Compensatory techniques    Potential to Achieve Goals  Fair    Potential Considerations  Severity of impairments;Ability to learn/carryover information;Financial resources   pt wishes to schedule only 4 weeks at this time d/t financial concerns   SLP Home Exercise Plan  abdominal breathing provided. pt to bring exercises given by hospital ST to next session    Consulted and Agree with Plan of Care  Patient;Family member/caregiver       Patient will benefit from skilled therapeutic intervention in order to improve the following deficits and impairments:   Dysarthria and anarthria    Problem List There are no active problems to display for this patient.   Amelia H. Roddie Mc, CCC-SLP Speech Language Pathologist   Wende Bushy 08/14/2018, 10:43 AM  [facility] [address] [place], [place], [address] Phone: [phone]   Fax:  [phone]   Name: GWENETH FREDLUND MRN: [id number] Date of Birth: 1935-02-16

## 2018-08-20 ENCOUNTER — Ambulatory Visit: Payer: Medicare Other | Admitting: Speech Pathology

## 2018-08-20 ENCOUNTER — Encounter: Payer: Self-pay | Admitting: Speech Pathology

## 2018-08-20 ENCOUNTER — Ambulatory Visit: Payer: Medicare Other | Admitting: Physical Therapy

## 2018-08-20 ENCOUNTER — Encounter: Payer: Self-pay | Admitting: Physical Therapy

## 2018-08-20 DIAGNOSIS — M6281 Muscle weakness (generalized): Secondary | ICD-10-CM

## 2018-08-20 DIAGNOSIS — R2689 Other abnormalities of gait and mobility: Secondary | ICD-10-CM

## 2018-08-20 DIAGNOSIS — R471 Dysarthria and anarthria: Secondary | ICD-10-CM | POA: Diagnosis not present

## 2018-08-20 DIAGNOSIS — R2681 Unsteadiness on feet: Secondary | ICD-10-CM

## 2018-08-20 NOTE — Therapy (Signed)
[facility] [address] [place], [place], [address] Phone: [phone]   Fax:  [phone]  Physical Therapy Treatment  Patient Details  Name: Chloe Wilcox MRN: [id number] Date of Birth: 1934/12/28 Referring Provider: Shirline Frees, MD   Encounter Date: 08/20/2018  PT End of Session - 08/20/18 1504    Visit Number  3    Number of Visits  9    Date for PT Re-Evaluation  10/08/18    Authorization Type  [employer] Medicare-will need 10th visit progress note    PT Start Time  0949    PT Stop Time  0929    PT Time Calculation (min)  1420 min    Activity Tolerance  Patient tolerated treatment well    Behavior During Therapy  [facility] for tasks assessed/performed       History reviewed. No pertinent past medical history.  History reviewed. No pertinent surgical history.  There were no vitals filed for this visit.  Subjective Assessment - 08/20/18 0852    Subjective  No changes since last visit; no falls.    Patient is accompained by:  Family member   cousin brought her back   Pertinent History  PMH on HTN, OA, CVA 07/22/18    Patient Stated Goals  Pt's goals for therapy are to get less stiffness, get stiff back and build strength in R side.  Daughter wants her to continue to be as mobile as possible.    Currently in Pain?  No/denies                       [facility] Adult PT Treatment/Exercise - 08/20/18 0001      Ambulation/Gait   Ambulation/Gait  Yes    Ambulation/Gait Assistance  5: Supervision    Ambulation Distance (Feet)  80 Feet   120, 60 ft x 2   Assistive device  4-wheeled walker    Gait Pattern  Step-through pattern;Trunk flexed;Decreased step length - right;Decreased step length - left    Ambulation Surface  Level;Indoor    Gait Comments  Cues for locking/unlocking brakes of rollator, for bringing rollator fully around to prepare to sit.  PT adjusts pt's locking mechanisms for brake, as both sides are  loose.      Exercises   Exercises  Knee/Hip      Knee/Hip Exercises: Aerobic   Nustep  Level 2, 4 extremities x 6 minutes for leg strengthening          Balance Exercises - 08/20/18 0901      Balance Exercises: Standing   SLS  Eyes open;Solid surface;Upper extremity support 2;5 reps;10 secs    Retro Gait  Upper extremity support;3 reps   Forward/back walking   Sidestepping  Upper extremity support;2 reps   Along counter, R and L   Marching Limitations  Marching in place x 10 reps.  Progressing to forward/back marching at counter UE support, 2 reps    Other Standing Exercises  Alternating hip kicks side x 10 reps, back x 10 reps, forward x 10 reps        PT Education - 08/20/18 1504    Education Details  Alternating kicks/marching added to HEP-see instructions    Person(s) Educated  Patient    Methods  Explanation;Demonstration;Handout    Comprehension  Verbalized understanding;Returned demonstration;Verbal cues required          PT Long Term Goals - 08/14/18 0924      PT LONG  TERM GOAL #1   Title  Pt will perform HEP with family's supervision, for improved balance, strength, and functional mobility.  TARGET 09/07/18    Time  5    Period  Weeks    Status  On-going      PT LONG TERM GOAL #2   Title  Pt will improve TUG score to less than or equal to 25 seconds for decreased fall risk.    Time  5    Period  Weeks    Status  On-going      PT LONG TERM GOAL #3   Title  Pt will improve gait velocity to at least 1.8 ft/sec for decreased fall risk, improved gait efficiency.    Time  5    Period  Weeks    Status  On-going      PT LONG TERM GOAL #4   Title  Patient to improve Berg Balance to >/= 41/56 to demonstrate reduced fall risk    Baseline  08/14/18: 37/56     Time  5    Period  Weeks    Status  New      PT LONG TERM GOAL #5   Title  Pt will ambulate at least 500 ft using rollator, modified independnetly, for improved community distance gait.    Time  5     Period  Weeks    Status  On-going      PT LONG TERM GOAL #6   Title  Pt will ambulate at least 150 ft using single point cane, modified independently, for improved gait for household distances.    Time  5    Period  Weeks    Status  On-going            Plan - 08/20/18 1505    Clinical Impression Statement  Continued to address rollator safety, with cues for locking/unlocking brakes and making sure to bring walker all the way around with her to safely sit down.  Also addressed balance activities and leg strengthening.  Pt will continue to benefit from skilled PT to further address balance, strength, and gait training.    Rehab Potential  Good    PT Frequency  2x / week    PT Duration  4 weeks   plus eval visit   PT Treatment/Interventions  ADLs/Self Care Home Management;DME Instruction;Gait training;Functional mobility training;Therapeutic activities;Therapeutic exercise;Balance training;Neuromuscular re-education;Patient/family education    PT Next Visit Plan  Review updates to HEP; continue safety education and gait training with rollator; strength and balance training    Consulted and Agree with Plan of Care  Patient       Patient will benefit from skilled therapeutic intervention in order to improve the following deficits and impairments:  Abnormal gait, Decreased balance, Decreased knowledge of use of DME, Decreased mobility, Difficulty walking, Decreased strength, Decreased safety awareness  Visit Diagnosis: Other abnormalities of gait and mobility  Unsteadiness on feet  Muscle weakness (generalized)     Problem List There are no active problems to display for this patient.   Farzana Koci W. 08/20/2018, 3:10 PM  Frazier Butt., PT   [facility] [address] [place], [place], [address] Phone: [phone]   Fax:  [phone]  Name: ALICIA SEIB MRN: [id number] Date of Birth: 1935/05/13

## 2018-08-20 NOTE — Patient Instructions (Signed)
Provided handout for alternating side hip kicks x 10 -forward hip kicks x 10 -back hip kicks x 10 -standing marching x 10 reps

## 2018-08-20 NOTE — Therapy (Signed)
[facility] [address] [place], [place], [address] Phone: [phone]   Fax:  [phone]  Speech Language Pathology Treatment  Patient Details  Name: Chloe Wilcox MRN: [id number] Date of Birth: 03-Mar-1935 Referring Provider: [name]   Encounter Date: 08/20/2018  End of Session - 08/20/18 1001    Visit Number  3    Number of Visits  9    Date for SLP Re-Evaluation  09/27/18    SLP Start Time  0935    SLP Stop Time   1015    SLP Time Calculation (min)  40 min    Activity Tolerance  Patient tolerated treatment well       History reviewed. No pertinent past medical history.  History reviewed. No pertinent surgical history.  There were no vitals filed for this visit.  Subjective Assessment - 08/20/18 0946    Subjective  "I think my voice is better and not really that different from normal"    Currently in Pain?  No/denies            ADULT SLP TREATMENT - 08/20/18 1018      General Information   Behavior/Cognition  Alert;Cooperative;Pleasant mood      Treatment Provided   Treatment provided  Cognitive-Linquistic      Pain Assessment   Pain Assessment  No/denies pain      Cognitive-Linquistic Treatment   Treatment focused on  Dysarthria    Skilled Treatment  Patient and niece (present for session) report continued spontaneous improvement to patient's volume and vocal quality; recommend continue 1-2 more sessions to reinforce strategies. Max A for Abdominal in tandem breathing exercises completed. Mild gravelly vocal quality and decreased loudness recallibrated by loud "HEY". Progressing to one word responses on expiration with intensity based cueing. Requiring mod verbal cueing to decrease vocal tension throughout speaking task. Pt used abdominal breathing when responding with short phrases with 80% accuracy with min A to "yell to a friend across the street".       Assessment / Recommendations  / Plan   Plan  Continue with current plan of care      Progression Toward Goals   Progression toward goals  Progressing toward goals       SLP Education - 08/20/18 1008    Education Details  Vocal home exercises provided         SLP Long Term Goals - 08/20/18 1003      SLP LONG TERM GOAL #1   Title  Pt will demo abdominal breathing at rest 80% accuracy, rare min A.     Baseline  Max A in tandem breathing     Time  3    Period  Weeks    Status  On-going      SLP LONG TERM GOAL #2   Title  Pt will use abdominal breathing 80% accuracy when responding with sentences 18/20 over 3 sessions.    Time  3    Period  Weeks    Status  On-going      SLP LONG TERM GOAL #3   Title  Pt will use abdominal breathing in 7 min simple conversation to reduce hoarse vocal quality, 80% accuracy with occasional min A.     Time  3    Period  Weeks      SLP LONG TERM GOAL #4   Title  Pt/caregiver will report fewer requests for pt to repeat herself.     Time  3    Period  Weeks    Status  New       Plan - 08/20/18 1012    Clinical Impression Statement  Pt presents with continued mild/mod hoarse, gravelly vocal quality and decreased vocal loudness. Max A for abdominal in tandem breathing exercises increase awareness of AB. Continue skilled ST to improve pt's vocal quality and ability to communicate. Pt's daughter present and denies changes in pt's language or cognitive abilities; they are most concerned about her vocal quality and would like to target this in therapy. I suspect baseline cognitive deficits; confrontation naming is mildly impaired (circumlocution), though there are no wordfinding difficulties in conversation. Recommend continue 1-2 more sessions to reinforce strategies and provide education. I recommend skilled ST to address dysarthria in order to improve pt's vocal quality, ability to communicate at home and in the community.     Speech Therapy Frequency  2x / week    Duration  4  weeks    Treatment/Interventions  Compensatory strategies;Oral motor exercises;SLP instruction and feedback;Patient/family education;Functional tasks;Cueing hierarchy;Compensatory techniques    Potential to Achieve Goals  Fair    Potential Considerations  Severity of impairments;Ability to learn/carryover information;Financial resources   pt wishes to schedule only 4 weeks at this time d/t financial concerns   SLP Home Exercise Plan  abdominal breathing provided. pt to bring exercises given by hospital ST to next session    Consulted and Agree with Plan of Care  Patient;Family member/caregiver       Patient will benefit from skilled therapeutic intervention in order to improve the following deficits and impairments:   Dysarthria and anarthria    Problem List There are no active problems to display for this patient.  Amelia H. Roddie Mc, CCC-SLP Speech Language Pathologist   Wende Bushy 08/20/2018, 10:19 AM  [facility] [address] [place], [place], [address] Phone: [phone]   Fax:  [phone]   Name: Chloe Wilcox MRN: [id number] Date of Birth: 11-29-1934

## 2018-08-20 NOTE — Patient Instructions (Signed)
   Loud "Hey!" with loud volume 5x twice a day - pretend like you're shouting at your friend across the street. Your voice should not sound strained. You should be able to feel your belly squeezing in to push out the sound.  Then, read words LOUD, with the same feeling of your belly squeezing in. If your voice starts to sound strained, or you forget how to do it, just go back to "Hey!"

## 2018-08-21 ENCOUNTER — Ambulatory Visit: Payer: Medicare Other | Admitting: Physical Therapy

## 2018-08-21 ENCOUNTER — Ambulatory Visit: Payer: Medicare Other | Admitting: Speech Pathology

## 2018-08-21 ENCOUNTER — Encounter: Payer: Self-pay | Admitting: Physical Therapy

## 2018-08-21 DIAGNOSIS — R41841 Cognitive communication deficit: Secondary | ICD-10-CM

## 2018-08-21 DIAGNOSIS — R471 Dysarthria and anarthria: Secondary | ICD-10-CM | POA: Diagnosis not present

## 2018-08-21 DIAGNOSIS — R2681 Unsteadiness on feet: Secondary | ICD-10-CM

## 2018-08-21 DIAGNOSIS — R2689 Other abnormalities of gait and mobility: Secondary | ICD-10-CM

## 2018-08-21 NOTE — Therapy (Signed)
[facility] [address] [place], [place], [address] Phone: [phone]   Fax:  [phone]  Speech Language Pathology Treatment  Patient Details  Name: Chloe Wilcox MRN: [id number] Date of Birth: January 23, 1935 Referring Provider: [name]   Encounter Date: 08/21/2018  End of Session - 08/21/18 1103    Visit Number  4    Number of Visits  9    Date for SLP Re-Evaluation  09/27/18    SLP Start Time  1016    SLP Stop Time   1058    SLP Time Calculation (min)  42 min    Activity Tolerance  Patient tolerated treatment well       No past medical history on file.  No past surgical history on file.  There were no vitals filed for this visit.  Subjective Assessment - 08/21/18 1058    Subjective  Pt greets SLP with hoarse vocal quality    Patient is accompained by:  Family member   daughter [name]   Currently in Pain?  No/denies            ADULT SLP TREATMENT - 08/21/18 1016      General Information   Behavior/Cognition  Alert;Cooperative;Pleasant mood      Treatment Provided   Treatment provided  Cognitive-Linquistic      Cognitive-Linquistic Treatment   Treatment focused on  Dysarthria    Skilled Treatment  Pt's vocal quality hoarse today upon entering treatment room. Daughter reports improvement in vocal quality however reports she continues to have difficulty understanding pt due to "sloppy" speech and hoarse voice. SLP used loud "Hey!" to recalibrate pt's effort and vocal intensity in structured speech tasks and conversation. Pt immediately stimulable for abdominal recruitment and improved vocal quality in phrase level tasks; this increased effort also improved articulation. Worked with pt and daughter to create personally relevant "everyday" phrases for practice in pt's "strong voice" to promote carryover of louder, clearer speech at home. Progressed to sentence level tasks with pt using AB in 19/20  responses. Occasional min A for carryover in simple conversation.      Assessment / Recommendations / Plan   Plan  Continue with current plan of care      Progression Toward Goals   Progression toward goals  Progressing toward goals           SLP Long Term Goals - 08/21/18 1203      SLP LONG TERM GOAL #1   Title  Pt will use abdominal breathing for loud "Hey!" over 3 sessions.    Baseline  08/21/18    Time  3    Status  Revised      SLP LONG TERM GOAL #2   Title  Pt will use abdominal breathing 80% accuracy when responding with sentences 18/20 over 3 sessions.    Baseline  08/21/18    Time  3    Period  Weeks    Status  On-going      SLP LONG TERM GOAL #3   Title  Pt will use abdominal breathing in 7 min simple conversation to reduce hoarse vocal quality, 80% accuracy with occasional min A.     Time  3    Period  Weeks    Status  On-going      SLP LONG TERM GOAL #4   Title  Pt/caregiver will report fewer requests for pt to repeat herself.     Time  3  Period  Weeks    Status  On-going       Plan - 08/21/18 1203    Clinical Impression Statement  Pt presents with mild dysarthria with hoarse vocal quality. Loud "Hey!" effective to encourage abdominal recruitment. Due to cognitive deficits, reduced cognitive load most effective cuing strategy (cuing for "strong voice"). Good carryover of strong voice in simple conversation with occasional cues required; improvements in vocal quality and articulation. Continue skilled ST to promote carryover outside of ST. I recommend skilled ST to address dysarthria in order to improve pt's vocal quality, ability to communicate at home and in the community.     Speech Therapy Frequency  2x / week    Duration  4 weeks    Treatment/Interventions  Compensatory strategies;Oral motor exercises;SLP instruction and feedback;Patient/family education;Functional tasks;Cueing hierarchy;Compensatory techniques    Potential to Achieve Goals  Fair     Potential Considerations  Severity of impairments;Ability to learn/carryover information;Financial resources       Patient will benefit from skilled therapeutic intervention in order to improve the following deficits and impairments:   Dysarthria and anarthria  Cognitive communication deficit    Problem List There are no active problems to display for this patient.  Deneise Lever, [place], CCC-SLP Speech-Language Pathologist  Aliene Altes 08/21/2018, 12:07 PM  [facility] [address] [place], [place], [address] Phone: [phone]   Fax:  [phone]   Name: Chloe Wilcox MRN: [id number] Date of Birth: 10/27/1935

## 2018-08-21 NOTE — Therapy (Signed)
[facility] [address] [place], [place], [address] Phone: [phone]   Fax:  [phone]  Physical Therapy Treatment  Patient Details  Name: Chloe Wilcox MRN: [id number] Date of Birth: 1935-05-26 Referring Provider: Shirline Frees, MD   Encounter Date: 08/21/2018  PT End of Session - 08/21/18 2055    Visit Number  4    Number of Visits  9    Date for PT Re-Evaluation  10/08/18    Authorization Type  [employer] Medicare-will need 10th visit progress note    PT Start Time  0936    PT Stop Time  1015    PT Time Calculation (min)  39 min    Activity Tolerance  Patient tolerated treatment well    Behavior During Therapy  [facility] for tasks assessed/performed       History reviewed. No pertinent past medical history.  History reviewed. No pertinent surgical history.  There were no vitals filed for this visit.  Subjective Assessment - 08/21/18 0939    Subjective  No changes, just a little soreness from yesterday-can tell I worked hard.  Will be going to the beach with my family.    Patient is accompained by:  Family member   cousin brought her back   Pertinent History  PMH on HTN, OA, CVA 07/22/18    Patient Stated Goals  Pt's goals for therapy are to get less stiffness, get stiff back and build strength in R side.  Daughter wants her to continue to be as mobile as possible.    Currently in Pain?  No/denies                       [facility] Adult PT Treatment/Exercise - 08/21/18 0001      Ambulation/Gait   Ambulation/Gait  Yes    Ambulation/Gait Assistance  5: Supervision    Ambulation/Gait Assistance Details  Adjusted pt's rollator by lowering the lower portion of the walker, as walker continued to seem too high, and pt reports some back pain.  Pt needs direct verbal cues for locking brakes prior to sitting, unlocking brakes prior to initiating gait; cues for hand placement to reach back for safety with sitting.     Ambulation Distance (Feet)  400 Feet   200   Assistive device  Rollator    Gait Pattern  Step-through pattern;Trunk flexed;Decreased step length - right;Decreased step length - left    Ambulation Surface  Level;Indoor      High Level Balance   High Level Balance Comments  Alternating step taps to 6" step, with bilat UE support.  With single UE support, consecutive single limb step taps to 6" step, 10 reps each.          Balance Exercises - 08/21/18 1001      Balance Exercises: Standing   Sidestepping  Upper extremity support;3 reps    Marching Limitations  Marching in place x 10 reps.      Heel Raises Limitations  Heel toe raises x 20 reps    Sit to Stand Time  5 reps throughout session-cues for hand placement    Other Standing Exercises  Review of HEP given yesterday:  Alternating hip kicks side x 10 reps, back x 10 reps, forward x 10 reps.  Pt return demo understanding, with needing cues for upright posture during standing exercises and avoiding excess forward lean onto counter.       Worked on multiple reps of leg  kicks, with cues for more upright posture and decreased forward lean onto counter.  Trialed step taps to side and to back, with pt having difficulty with directions for that modification of exercise.      PT Long Term Goals - 08/14/18 0924      PT LONG TERM GOAL #1   Title  Pt will perform HEP with family's supervision, for improved balance, strength, and functional mobility.  TARGET 09/07/18    Time  5    Period  Weeks    Status  On-going      PT LONG TERM GOAL #2   Title  Pt will improve TUG score to less than or equal to 25 seconds for decreased fall risk.    Time  5    Period  Weeks    Status  On-going      PT LONG TERM GOAL #3   Title  Pt will improve gait velocity to at least 1.8 ft/sec for decreased fall risk, improved gait efficiency.    Time  5    Period  Weeks    Status  On-going      PT LONG TERM GOAL #4   Title  Patient to improve Berg  Balance to >/= 41/56 to demonstrate reduced fall risk    Baseline  08/14/18: 37/56     Time  5    Period  Weeks    Status  New      PT LONG TERM GOAL #5   Title  Pt will ambulate at least 500 ft using rollator, modified independnetly, for improved community distance gait.    Time  5    Period  Weeks    Status  On-going      PT LONG TERM GOAL #6   Title  Pt will ambulate at least 150 ft using single point cane, modified independently, for improved gait for household distances.    Time  5    Period  Weeks    Status  On-going            Plan - 08/21/18 2056    Clinical Impression Statement  With balance exercises today, pt needs cues for upright posture, to decrease reliance on UE support and to decrease trunk lean.  Pt also continues to require cues for proper technique for walker with transfers for optimal walker safety.  Pt will continue to benefit from skilled PT to address balance, strength and gait towards LTGs.    Rehab Potential  Good    PT Frequency  2x / week    PT Duration  4 weeks   plus eval visit   PT Treatment/Interventions  ADLs/Self Care Home Management;DME Instruction;Gait training;Functional mobility training;Therapeutic activities;Therapeutic exercise;Balance training;Neuromuscular re-education;Patient/family education    PT Next Visit Plan  Continue safety education and gait training with rollator; strength and balance training    Consulted and Agree with Plan of Care  Patient;Family member/caregiver    Family Member Consulted  daughter       Patient will benefit from skilled therapeutic intervention in order to improve the following deficits and impairments:  Abnormal gait, Decreased balance, Decreased knowledge of use of DME, Decreased mobility, Difficulty walking, Decreased strength, Decreased safety awareness  Visit Diagnosis: Unsteadiness on feet  Other abnormalities of gait and mobility     Problem List There are no active problems to display  for this patient.   Christo Hain W. 08/21/2018, 8:59 PM  Frazier Butt., PT   [facility]  [place], [place], [address] Phone: [phone]   Fax:  [phone]  Name: Chloe Wilcox MRN: [id number] Date of Birth: [birth year]

## 2018-08-21 NOTE — Patient Instructions (Addendum)
   Every day phrases  1. Come fix my TV!  2. Where is the remote?  3. What's in the frigerator?  4. Do we have any bread?  5. Make me some coffee.  6. What we gonna have for dinner?  7. It's lunchtime.   8. Make sure you put the trash out.  [place]!  10. Janett Billow!  11. [place]!  12. [place]!

## 2018-08-31 ENCOUNTER — Ambulatory Visit: Payer: Medicare Other | Admitting: Physical Therapy

## 2018-08-31 ENCOUNTER — Encounter: Payer: Medicare Other | Admitting: Speech Pathology

## 2018-09-13 ENCOUNTER — Ambulatory Visit: Payer: Medicare Other | Admitting: Physical Therapy

## 2018-09-13 ENCOUNTER — Telehealth: Payer: Self-pay | Admitting: Physical Therapy

## 2018-09-13 ENCOUNTER — Ambulatory Visit: Payer: Medicare Other | Attending: Family Medicine | Admitting: Speech Pathology

## 2018-09-13 DIAGNOSIS — M6281 Muscle weakness (generalized): Secondary | ICD-10-CM | POA: Insufficient documentation

## 2018-09-13 DIAGNOSIS — R471 Dysarthria and anarthria: Secondary | ICD-10-CM | POA: Insufficient documentation

## 2018-09-13 DIAGNOSIS — R2689 Other abnormalities of gait and mobility: Secondary | ICD-10-CM | POA: Insufficient documentation

## 2018-09-13 DIAGNOSIS — R2681 Unsteadiness on feet: Secondary | ICD-10-CM | POA: Insufficient documentation

## 2018-09-13 NOTE — Telephone Encounter (Signed)
Attempted to call both mobile and home numbers (mobile number no answer, home number disconnected) to inquire about pt's missed PT appointment today.    Mady Haagensen, PT 09/13/18 9:12 AM Phone: [phone] Fax: [phone]

## 2018-09-18 ENCOUNTER — Ambulatory Visit: Payer: Medicare Other | Admitting: Speech Pathology

## 2018-09-18 ENCOUNTER — Ambulatory Visit: Payer: Medicare Other

## 2018-09-18 ENCOUNTER — Encounter: Payer: Self-pay | Admitting: Speech Pathology

## 2018-09-18 DIAGNOSIS — R471 Dysarthria and anarthria: Secondary | ICD-10-CM

## 2018-09-18 DIAGNOSIS — R2689 Other abnormalities of gait and mobility: Secondary | ICD-10-CM

## 2018-09-18 DIAGNOSIS — R2681 Unsteadiness on feet: Secondary | ICD-10-CM

## 2018-09-18 DIAGNOSIS — M6281 Muscle weakness (generalized): Secondary | ICD-10-CM | POA: Diagnosis present

## 2018-09-18 NOTE — Patient Instructions (Signed)
  Do the following 3x a day  Hand on belly with thumb on belly button - suck in and say "Sh" then breathe in as you belly goes out - 10 x  Next, say "Hey" with belly going in, then breathe in as your belly goes out - 10x  Add Hey [name] 5x; Hey Chloe 5x with belly in when you talk and breath in when you relax belly  Continue on with your phrases 2x each

## 2018-09-18 NOTE — Therapy (Signed)
[facility] [address] [place], [place], [address] Phone: [phone]   Fax:  [phone]  Physical Therapy Treatment  Patient Details  Name: Chloe Wilcox MRN: [id number] Date of Birth: 23-Feb-1935 Referring Provider (PT): Shirline Frees, MD   Encounter Date: 09/18/2018  PT End of Session - 09/18/18 1024    Visit Number  5    Number of Visits  9    Date for PT Re-Evaluation  10/08/18    Authorization Type  [employer] Medicare-will need 10th visit progress note    PT Start Time  1020    PT Stop Time  1100    PT Time Calculation (min)  40 min    Equipment Utilized During Treatment  Gait belt    Activity Tolerance  Patient tolerated treatment well    Behavior During Therapy  [facility] for tasks assessed/performed       History reviewed. No pertinent past medical history.  History reviewed. No pertinent surgical history.  There were no vitals filed for this visit.  Subjective Assessment - 09/18/18 1023    Subjective  No falls to report, HEP is going well.     Patient is accompained by:  Family member    Pertinent History  PMH on HTN, OA, CVA 07/22/18    Patient Stated Goals  Pt's goals for therapy are to get less stiffness, get stiff back and build strength in R side.  Daughter wants her to continue to be as mobile as possible.    Currently in Pain?  No/denies        [facility] Adult PT Treatment/Exercise - 09/18/18 1024      Ambulation/Gait   Ambulation/Gait  Yes    Ambulation/Gait Assistance  4: Min guard;5: Supervision    Ambulation/Gait Assistance Details  VC's for forward gaze and step length.     Ambulation Distance (Feet)  115 Feet   x2   Assistive device  Straight cane;Other (Comment)   With rubber quad tip   Gait Pattern  Step-through pattern;Trunk flexed;Decreased step length - right;Decreased step length - left    Ambulation Surface  Level;Indoor      High Level Balance   High Level Balance Activities  Side  stepping;Backward walking;Tandem walking;Marching forwards;Marching backwards;Negotiating over obstacles    High Level Balance Comments  At counter on red mat BUE progressing to SUE with no LOB noted, VC's for forward gaze, posture and technique.       Knee/Hip Exercises: Aerobic   Nustep  L1 10 mins 273 steps        PT Long Term Goals - 08/14/18 0924      PT LONG TERM GOAL #1   Title  Pt will perform HEP with family's supervision, for improved balance, strength, and functional mobility.  TARGET 09/07/18    Time  5    Period  Weeks    Status  On-going      PT LONG TERM GOAL #2   Title  Pt will improve TUG score to less than or equal to 25 seconds for decreased fall risk.    Time  5    Period  Weeks    Status  On-going      PT LONG TERM GOAL #3   Title  Pt will improve gait velocity to at least 1.8 ft/sec for decreased fall risk, improved gait efficiency.    Time  5    Period  Weeks    Status  On-going  PT LONG TERM GOAL #4   Title  Patient to improve Berg Balance to >/= 41/56 to demonstrate reduced fall risk    Baseline  08/14/18: 37/56     Time  5    Period  Weeks    Status  New      PT LONG TERM GOAL #5   Title  Pt will ambulate at least 500 ft using rollator, modified independnetly, for improved community distance gait.    Time  5    Period  Weeks    Status  On-going      PT LONG TERM GOAL #6   Title  Pt will ambulate at least 150 ft using single point cane, modified independently, for improved gait for household distances.    Time  5    Period  Weeks    Status  On-going        Plan - 09/18/18 1439    Clinical Impression Statement  Todays skilled session focused on gait training with quad tipped cane, LE strength on Nustep and High level balance on a compliant surface with SUE support. Pt should benefit from continued PT to progress towards unmet goals.     Rehab Potential  Good    PT Frequency  2x / week    PT Duration  4 weeks   plus eval visit   PT  Treatment/Interventions  ADLs/Self Care Home Management;DME Instruction;Gait training;Functional mobility training;Therapeutic activities;Therapeutic exercise;Balance training;Neuromuscular re-education;Patient/family education    PT Next Visit Plan  Continue safety education and gait training with rollator/quad tipped cane; strength and balance training    Consulted and Agree with Plan of Care  Patient;Family member/caregiver    Family Member Consulted  daughter       Patient will benefit from skilled therapeutic intervention in order to improve the following deficits and impairments:  Abnormal gait, Decreased balance, Decreased knowledge of use of DME, Decreased mobility, Difficulty walking, Decreased strength, Decreased safety awareness  Visit Diagnosis: Unsteadiness on feet  Other abnormalities of gait and mobility     Problem List There are no active problems to display for this patient.  Serayah Yazdani, PTA  Remmie Bembenek A Musette Kisamore 09/18/2018, 2:42 PM  [facility] [address] [place], [place], [address] Phone: [phone]   Fax:  [phone]  Name: Chloe Wilcox MRN: [id number] Date of Birth: 1935/11/09

## 2018-09-18 NOTE — Therapy (Signed)
Chloe Wilcox [address] [place], [place], [address] Phone: [phone]   Fax:  [phone]  Speech Language Pathology Treatment  Patient Details  Name: Chloe Wilcox MRN: [id number] Date of Birth: [birth year] Referring Provider (SLP): [name]   Encounter Date: 09/18/2018  End of Session - 09/18/18 1019    Visit Number  5    Number of Visits  9    Date for SLP Re-Evaluation  10/19/18    Authorization Type  extended date for re-eval by 3 weeks, due to 3 week lapse in ST    SLP Start Time  0929    SLP Stop Time   1010    SLP Time Calculation (min)  41 min    Activity Tolerance  Patient tolerated treatment well       History reviewed. No pertinent past medical history.  History reviewed. No pertinent surgical history.  There were no vitals filed for this visit.  Subjective Assessment - 09/18/18 1008    Subjective  "She sounds better"    Patient is accompained by:  Family member   granddaughter, Chloe Wilcox           ADULT SLP TREATMENT - 09/18/18 0952      General Information   Behavior/Cognition  Alert;Cooperative;Pleasant mood      Treatment Provided   Treatment provided  Cognitive-Linquistic      Cognitive-Linquistic Treatment   Treatment focused on  Dysarthria    Skilled Treatment  Pt with hoarse voice quality upon entering therapy room. Pt returns after 3 weeks. Re-trained abdominal breathing with loud "HEY" to reduce hoarse voice and achieve clear phonation. Pt initally required consistent max A to achieve abdominal breathing and reduce pharyngeal tension. Pt utilized abdominal breathing for short personally relevant phrases (2-4 words) with usual mod A. Granddaughter trained in appropriate cueing to assist pt with HEP due to pt's reduced cognition. Granddaughter, Chloe Wilcox, demonstrates and verbalizes understanding of HEP and cues for pt. Pt with improved articulation spontaneously during vocal  intensity tasks. Pt reports drooling has ceased.      Assessment / Recommendations / Plan   Plan  Continue with current plan of care      Progression Toward Goals   Progression toward goals  Progressing toward goals       SLP Education - 09/18/18 1014    Education Details  HEP for vocal intensity; abdominal breathing HEP    Person(s) Educated  Patient;Child(ren)   granddaughter, Chloe Wilcox   Methods  Explanation;Demonstration;Tactile cues;Verbal cues    Comprehension  Verbalized understanding;Returned demonstration;Verbal cues required;Tactile cues required;Need further instruction         SLP Long Term Goals - 09/18/18 1018      SLP LONG TERM GOAL #1   Title  Pt will use abdominal breathing for loud "Hey!" over 3 sessions.    Baseline  08/21/18; 09/18/18    Time  2    Status  Revised      SLP LONG TERM GOAL #2   Title  Pt will use abdominal breathing 80% accuracy when responding with sentences 18/20 over 3 sessions.    Baseline  08/21/18    Time  2    Period  Weeks    Status  On-going      SLP LONG TERM GOAL #3   Title  Pt will use abdominal breathing in 7 min simple conversation to reduce hoarse vocal quality, 80% accuracy with occasional min A.  Time  2    Period  Weeks    Status  On-going      SLP LONG TERM GOAL #4   Title  Pt/caregiver will report fewer requests for pt to repeat herself.     Baseline  09/18/18    Time  2    Period  Weeks    Status  On-going       Plan - 09/18/18 1014    Clinical Impression Statement  Pt returns after 3 weeks -Hoarse vocal quality persists affecting intelligibility.   Pt required re-training for abdominal breathing and vocal intensity.  Due to reduced cognition, granddaughter, Chloe Wilcox trained and instructed to provide assistance with pt completing HEP 3x a day. Articulation improves with vocal intensity tasks. Spontaneous conversation with improved vocal quality as session progressed. Continue skilled ST to maximize carryover of  vocal quality and intelligibility.     Speech Therapy Frequency  2x / week    Duration  4 weeks   total of 9 visits or 4 weeks   Treatment/Interventions  Compensatory strategies;Oral motor exercises;SLP instruction and feedback;Patient/family education;Functional tasks;Cueing hierarchy;Compensatory techniques    Potential Considerations  Severity of impairments;Ability to learn/carryover information;Financial resources    SLP Home Exercise Plan  abdominal breathing provided. pt to bring exercises given by hospital ST to next session    Consulted and Agree with Plan of Care  Patient;Family member/caregiver    Family Member Consulted  granddaughter, Chloe Wilcox       Patient will benefit from skilled therapeutic intervention in order to improve the following deficits and impairments:   Dysarthria and anarthria    Problem List There are no active problems to display for this patient.   Emmanuel Ercole, Annye Rusk  MS, CCC-SLP 09/18/2018, 10:21 AM  [facility] [address] [place], [place], [address] Phone: [phone]   Fax:  [phone]   Name: Chloe Wilcox MRN: [id number] Date of Birth: 01-17-1935

## 2018-09-20 ENCOUNTER — Encounter: Payer: Self-pay | Admitting: Physical Therapy

## 2018-09-20 ENCOUNTER — Encounter: Payer: Self-pay | Admitting: Speech Pathology

## 2018-09-20 ENCOUNTER — Ambulatory Visit: Payer: Medicare Other | Admitting: Speech Pathology

## 2018-09-20 ENCOUNTER — Ambulatory Visit: Payer: Medicare Other | Admitting: Physical Therapy

## 2018-09-20 DIAGNOSIS — R2689 Other abnormalities of gait and mobility: Secondary | ICD-10-CM

## 2018-09-20 DIAGNOSIS — R2681 Unsteadiness on feet: Secondary | ICD-10-CM

## 2018-09-20 DIAGNOSIS — R471 Dysarthria and anarthria: Secondary | ICD-10-CM

## 2018-09-20 DIAGNOSIS — M6281 Muscle weakness (generalized): Secondary | ICD-10-CM

## 2018-09-20 NOTE — Patient Instructions (Signed)
  Start with [place] and suck in, then breathe in with belly out  Then HEY with belly in, then breathe out with belly out   Start with the sheet I gave you last time, then add in your phrases  Practice your phrases twice a day - belly in when you talk - hand on belly to remember  Good morning Miss Eulas Post!  I call you the other night  Good evening!  You had a good night?  How is your daughter?  [place]er!  Good Morning Rev.   My nephew Jene Every is preaching

## 2018-09-20 NOTE — Therapy (Addendum)
[facility] [address] [place], [place], [address] Phone: [phone]   Fax:  [phone]  Patient Details  Name: Chloe Wilcox MRN: [id number] Date of Birth: Mar 22, 1935 Referring Provider:  [name], MD  Encounter Date: 09/20/2018 PT End of Session - 09/20/18 2204    Visit Number  6    Number of Visits  14    Date for PT Re-Evaluation  76/72/09   per recert 47/09/62   Authorization Type  [facility] need 10th visit progress note    PT Start Time  1015    PT Stop Time  1100    PT Time Calculation (min)  45 min    Equipment Utilized During Treatment  Gait belt    Activity Tolerance  Patient tolerated treatment well    Behavior During Therapy  [facility] for tasks assessed/performed       Subjective Assessment - 09/20/18 1021    Subjective  Feel like I'm doing better.  No falls.  Use stick in the house, use walker when I go out.    Patient is accompained by:  Family member    Pertinent History  PMH on HTN, OA, CVA 07/22/18    Patient Stated Goals  Pt's goals for therapy are to get less stiffness, get stiff back and build strength in R side.  Daughter wants her to continue to be as mobile as possible.    Currently in Pain?  Yes    Pain Score  3     Pain Location  Knee    Pain Orientation  Right    Pain Descriptors / Indicators  Aching;Discomfort    Pain Type  Chronic pain    Pain Onset  More than a month ago    Pain Frequency  Constant    Aggravating Factors   first waking up/stiffness    Pain Relieving Factors  cream       [facility] Adult PT Treatment/Exercise - 09/20/18 1023      Ambulation/Gait   Ambulation/Gait  Yes    Ambulation/Gait Assistance  5: Supervision    Ambulation Distance (Feet)  50 Feet   x 4, 200 ft with cane   Assistive device  Straight cane;Other (Comment);Rollator   with rubber quad tip   Gait Pattern  Step-through pattern;Trunk flexed;Decreased step length - right;Decreased step  length - left    Ambulation Surface  Level;Indoor    Gait velocity  23.78 sec with cane (1.38 ft/sec); with rollator 19.9 sec = 1.65 ft/sec      Standardized Balance Assessment   Standardized Balance Assessment  Berg Balance Test;Timed Up and Go Test      Berg Balance Test   Sit to Stand  Able to stand without using hands and stabilize independently    Standing Unsupported  Able to stand safely 2 minutes    Sitting with Back Unsupported but Feet Supported on Floor or Stool  Able to sit safely and securely 2 minutes    Stand to Sit  Controls descent by using hands    Transfers  Able to transfer safely, minor use of hands    Standing Unsupported with Eyes Closed  Able to stand 10 seconds safely    Standing Ubsupported with Feet Together  Able to place feet together independently and stand 1 minute safely    From Standing, Reach Forward with Outstretched Arm  Can reach forward >12 cm safely (5")    From Standing Position, Pick up  Object from Floor  Able to pick up shoe safely and easily    From Standing Position, Turn to Look Behind Over each Shoulder  Looks behind from both sides and weight shifts well    Turn 360 Degrees  Able to turn 360 degrees safely but slowly    Standing Unsupported, Alternately Place Feet on Step/Stool  Able to stand independently and complete 8 steps >20 seconds    Standing Unsupported, One Foot in Front  Able to take small step independently and hold 30 seconds    Standing on One Leg  Unable to try or needs assist to prevent fall    Total Score  45      Timed Up and Go Test   TUG  Normal TUG    Normal TUG (seconds)  30.62   cane; 29.69 with rollator       PT Long Term Goals - 09/20/18 1047      PT LONG TERM GOAL #1   Title  Pt will perform HEP with family's supervision, for improved balance, strength, and functional mobility.  Updated TARGET 10/19/18    Time  4    Period  Weeks    Status  On-going    Target Date  10/19/18      PT LONG TERM GOAL #2    Title  Pt will improve TUG score to less than or equal to 25 seconds for decreased fall risk.    Baseline  29.69 rollator 09/20/18    Time  4    Period  Weeks    Status  On-going    Target Date  10/19/18      PT LONG TERM GOAL #3   Title  Pt will improve gait velocity to at least 1.8 ft/sec for decreased fall risk, improved gait efficiency.    Baseline  1.65 ft/sec rollator    Time  4    Period  Weeks    Status  On-going    Target Date  10/19/18      PT LONG TERM GOAL #4   Title  Patient to improve Berg Balance to >/= 41/56 to demonstrate reduced fall risk    Baseline  45/56 09/20/18    Time  5    Period  Weeks    Status  Achieved      PT LONG TERM GOAL #5   Title  Pt will ambulate at least 500 ft using rollator, modified independnetly, for improved community distance gait.    Time  4    Period  Weeks    Status  On-going    Target Date  10/19/18      PT LONG TERM GOAL #6   Title  Pt will ambulate at least 150 ft using single point cane, modified independently, for improved gait for household distances.    Time  5    Period  Weeks    Status  Partially Met      Other abnormalities of gait and mobility - Plan: PT plan of care cert/re-cert  Unsteadiness on feet - Plan: PT plan of care cert/re-cert  Muscle weakness (generalized) - Plan: PT plan of care cert/re-cert   Plan - 70/62/37 2206    Clinical Impression Statement  Today's session focused on assessing LTGs, as pt has been gone out of town, missed last week's PT appointment (reason unknown), and was seen prior to this week on 08/21/18.  Pt has improve Berg score, gait velocity (with rollator walker) and improved TUG  score.  She has met LTG 4, partially met LTG 6.  Other LTGs have not yet been met and are ongoing.  Pt will benefit from skilled PT to address balance, strength, and safety with gait, as pt remains at fall risk per TUG and gait velocity scores.    Rehab Potential  Good    PT Frequency  2x / week    PT  Duration  4 weeks   per recert 06/99/96   PT Treatment/Interventions  ADLs/Self Care Home Management;DME Instruction;Gait training;Functional mobility training;Therapeutic activities;Therapeutic exercise;Balance training;Neuromuscular re-education;Patient/family education    PT Next Visit Plan  Add to HEP for SLS, tandem stance (with UE support), additional lower extremity strengthening and balance    Consulted and Agree with Plan of Care  Patient    Family Member Consulted           Frazier Butt 09/20/2018, 10:18 PM  Mady Haagensen, PT 09/21/18 1:04 PM Phone: [phone] Fax: [facility] [address] [place], [place], [address] Phone: [phone]   Fax:  [phone]

## 2018-09-20 NOTE — Therapy (Signed)
Chloe Wilcox [address] [place], [place], [address] Phone: [phone]   Fax:  [phone]  Speech Language Pathology Treatment  Patient Details  Name: Chloe Wilcox MRN: [id number] Date of Birth: 1935/04/18 Referring Provider (SLP): [name]   Encounter Date: 09/20/2018  End of Session - 09/20/18 1018    Visit Number  6    Number of Visits  9    Date for SLP Re-Evaluation  10/19/18    SLP Start Time  0932    SLP Stop Time   1016    SLP Time Calculation (min)  44 min    Activity Tolerance  Patient tolerated treatment well       History reviewed. No pertinent past medical history.  History reviewed. No pertinent surgical history.  There were no vitals filed for this visit.  Subjective Assessment - 09/20/18 0937    Subjective  "My granddaughter said you would think I'm not listening to you because I didn 't get to work on it much yesterday"    Currently in Pain?  Yes    Pain Score  3     Pain Location  Knee    Pain Orientation  Right    Pain Descriptors / Indicators  Aching;Discomfort    Pain Type  Chronic pain    Pain Onset  More than a month ago    Pain Frequency  Constant            ADULT SLP TREATMENT - 09/20/18 0939      General Information   Behavior/Cognition  Alert;Cooperative;Pleasant mood      Treatment Provided   Treatment provided  Cognitive-Linquistic      Cognitive-Linquistic Treatment   Treatment focused on  Dysarthria    Skilled Treatment  Pt enters with mildly hoarse voice, with intermittent clear phonation as she is cued to project. Reviewed abdominal breathing in isolation with mod A, when SH added to abdominal contraction, pt achieved AB with rare min. Pt completed simple, personal phrases with occasional min A. Simple conversation 85% with clear phonation with rare min A. Generate 2nd list of personally relevant phrases for additional home practice      Assessment /  Recommendations / [place] with current plan of care      Progression Toward Goals   Progression toward goals  Progressing toward goals       SLP Education - 09/20/18 1012    Education Details  Continue HEP; can use new and prior phrases     Person(s) Educated  Patient;Child(ren)    Methods  Explanation;Demonstration;Tactile cues;Verbal cues    Comprehension  Verbalized understanding;Returned demonstration;Verbal cues required;Need further instruction         SLP Long Term Goals - 09/20/18 1016      SLP LONG TERM GOAL #1   Title  Pt will use abdominal breathing for loud "Hey!" over 3 sessions.    Baseline  08/21/18; 09/18/18; 09/20/18    Time  2    Status  Achieved      SLP LONG TERM GOAL #2   Title  Pt will use abdominal breathing 80% accuracy when responding with sentences 18/20 over 3 sessions.    Baseline  08/21/18; 09/20/18    Time  2    Period  Weeks    Status  On-going      SLP LONG TERM GOAL #3   Title  Pt will use abdominal breathing in  7 min simple conversation to reduce hoarse vocal quality, 80% accuracy with occasional min A.     Baseline  09/20/18    Time  2    Period  Weeks    Status  On-going      SLP LONG TERM GOAL #4   Title  Pt/caregiver will report fewer requests for pt to repeat herself.     Baseline  09/18/18    Time  2    Period  Weeks    Status  On-going       Plan - 09/20/18 1013    Clinical Impression Statement  Pt with improved vocal quality in conversation and in structured phrases with 80% clear phonation with occasional min A for vocal intensity and abdominal breathing. Pt instructed to complete HEP 2-3x a day and instructed her to have granddaughter help her. Continue skilled ST to maximize intelligibility across home and community environments.     Speech Therapy Frequency  2x / week    Duration  4 weeks    Treatment/Interventions  Compensatory strategies;Oral motor exercises;SLP instruction and feedback;Patient/family  education;Functional tasks;Cueing hierarchy;Compensatory techniques    Potential to Achieve Goals  Fair    Potential Considerations  Severity of impairments;Ability to learn/carryover information;Financial resources    Consulted and Agree with Plan of Care  Patient;Family member/caregiver       Patient will benefit from skilled therapeutic intervention in order to improve the following deficits and impairments:   Dysarthria and anarthria    Problem List There are no active problems to display for this patient.   Chloe Wilcox, Annye Rusk MS, CCC-SLP 09/20/2018, 10:18 AM  [facility] [address] [place], [place], [address] Phone: [phone]   Fax:  [phone]   Name: Chloe Wilcox MRN: [id number] Date of Birth: 04-18-1935

## 2018-09-21 NOTE — Addendum Note (Signed)
Addended by: Frazier Butt on: 09/21/2018 01:07 PM   Modules accepted: Orders

## 2018-09-24 ENCOUNTER — Ambulatory Visit: Payer: Medicare Other | Admitting: Speech Pathology

## 2018-09-24 ENCOUNTER — Ambulatory Visit: Payer: Medicare Other | Admitting: Physical Therapy

## 2018-09-27 ENCOUNTER — Ambulatory Visit: Payer: Medicare Other | Admitting: Physical Therapy

## 2018-09-27 ENCOUNTER — Encounter: Payer: Medicare Other | Admitting: Speech Pathology

## 2018-10-01 ENCOUNTER — Ambulatory Visit: Payer: Medicare Other | Admitting: Physical Therapy

## 2018-10-01 ENCOUNTER — Encounter: Payer: Medicare Other | Admitting: Speech Pathology

## 2019-01-23 ENCOUNTER — Other Ambulatory Visit: Payer: Self-pay | Admitting: Family Medicine

## 2019-01-23 DIAGNOSIS — D259 Leiomyoma of uterus, unspecified: Secondary | ICD-10-CM

## 2019-02-08 ENCOUNTER — Ambulatory Visit
Admission: RE | Admit: 2019-02-08 | Discharge: 2019-02-08 | Disposition: A | Discharge status: Home / Self Care | Payer: Medicare Other | Source: Ambulatory Visit | Attending: Family Medicine | Admitting: Family Medicine

## 2019-02-08 DIAGNOSIS — D259 Leiomyoma of uterus, unspecified: Secondary | ICD-10-CM

## 2019-02-18 ENCOUNTER — Encounter: Payer: Self-pay | Admitting: Physical Therapy

## 2019-02-18 NOTE — Therapy (Signed)
[facility] [address] [place], [place], [address] Phone: [phone]   Fax:  [phone]  Patient Details  Name: Chloe Wilcox MRN: [id number] Date of Birth: 07/13/1935 Referring Provider:  No ref. provider found  Encounter Date: 02/18/2019  PHYSICAL THERAPY DISCHARGE SUMMARY  Visits from Start of Care: 6  Current functional level related to goals / functional outcomes: PT Long Term Goals - 09/20/18 1047      PT LONG TERM GOAL #1   Title  Pt will perform HEP with family's supervision, for improved balance, strength, and functional mobility.  Updated TARGET 10/19/18    Time  4    Period  Weeks    Status  On-going    Target Date  10/19/18      PT LONG TERM GOAL #2   Title  Pt will improve TUG score to less than or equal to 25 seconds for decreased fall risk.    Baseline  29.69 rollator 09/20/18    Time  4    Period  Weeks    Status  On-going    Target Date  10/19/18      PT LONG TERM GOAL #3   Title  Pt will improve gait velocity to at least 1.8 ft/sec for decreased fall risk, improved gait efficiency.    Baseline  1.65 ft/sec rollator    Time  4    Period  Weeks    Status  On-going    Target Date  10/19/18      PT LONG TERM GOAL #4   Title  Patient to improve Berg Balance to >/= 41/56 to demonstrate reduced fall risk    Baseline  45/56 09/20/18    Time  5    Period  Weeks    Status  Achieved      PT LONG TERM GOAL #5   Title  Pt will ambulate at least 500 ft using rollator, modified independnetly, for improved community distance gait.    Time  4    Period  Weeks    Status  On-going    Target Date  10/19/18      PT LONG TERM GOAL #6   Title  Pt will ambulate at least 150 ft using single point cane, modified independently, for improved gait for household distances.    Time  5    Period  Weeks    Status  Partially Met      LTGs not able to be fully addressed or assessed, as pt did not return  after 09/20/18.   Remaining deficits: Balance, gait   Education / Equipment: HEP initiated, unable to fully address, due to pt not returning after 09/20/18 visit.  Plan: Patient agrees to discharge.  Patient goals were not met. Patient is being discharged due to not returning since the last visit.  ?????       Bryten Maher W. 02/18/2019, 8:31 AM Mady Haagensen, PT 02/18/19 8:33 AM Phone: [phone] Fax: [facility] [address] [place], [place], [address] Phone: [phone]   Fax:  [phone]

## 2019-06-14 ENCOUNTER — Emergency Department (HOSPITAL_COMMUNITY): Payer: Medicare Other

## 2019-06-14 ENCOUNTER — Inpatient Hospital Stay (HOSPITAL_COMMUNITY)
Admission: EM | Admit: 2019-06-14 | Discharge: 2019-06-16 | DRG: 190 | Disposition: A | Discharge status: Home Health | Payer: Medicare Other | Attending: Internal Medicine | Admitting: Internal Medicine

## 2019-06-14 ENCOUNTER — Encounter (HOSPITAL_COMMUNITY): Payer: Self-pay | Admitting: Emergency Medicine

## 2019-06-14 ENCOUNTER — Other Ambulatory Visit: Payer: Self-pay

## 2019-06-14 DIAGNOSIS — R0602 Shortness of breath: Secondary | ICD-10-CM | POA: Diagnosis not present

## 2019-06-14 DIAGNOSIS — J441 Chronic obstructive pulmonary disease with (acute) exacerbation: Secondary | ICD-10-CM | POA: Diagnosis not present

## 2019-06-14 DIAGNOSIS — I10 Essential (primary) hypertension: Secondary | ICD-10-CM | POA: Diagnosis not present

## 2019-06-14 DIAGNOSIS — Z8673 Personal history of transient ischemic attack (TIA), and cerebral infarction without residual deficits: Secondary | ICD-10-CM

## 2019-06-14 DIAGNOSIS — J9601 Acute respiratory failure with hypoxia: Secondary | ICD-10-CM | POA: Diagnosis not present

## 2019-06-14 DIAGNOSIS — J440 Chronic obstructive pulmonary disease with acute lower respiratory infection: Secondary | ICD-10-CM

## 2019-06-14 DIAGNOSIS — J9690 Respiratory failure, unspecified, unspecified whether with hypoxia or hypercapnia: Secondary | ICD-10-CM | POA: Diagnosis present

## 2019-06-14 DIAGNOSIS — E876 Hypokalemia: Secondary | ICD-10-CM

## 2019-06-14 DIAGNOSIS — J449 Chronic obstructive pulmonary disease, unspecified: Secondary | ICD-10-CM | POA: Diagnosis present

## 2019-06-14 DIAGNOSIS — R0902 Hypoxemia: Secondary | ICD-10-CM

## 2019-06-14 DIAGNOSIS — Z87891 Personal history of nicotine dependence: Secondary | ICD-10-CM

## 2019-06-14 DIAGNOSIS — F329 Major depressive disorder, single episode, unspecified: Secondary | ICD-10-CM | POA: Diagnosis present

## 2019-06-14 DIAGNOSIS — Z1159 Encounter for screening for other viral diseases: Secondary | ICD-10-CM

## 2019-06-14 DIAGNOSIS — J209 Acute bronchitis, unspecified: Secondary | ICD-10-CM

## 2019-06-14 HISTORY — DX: Essential (primary) hypertension: I10

## 2019-06-14 HISTORY — DX: Depression, unspecified: F32.A

## 2019-06-14 LAB — CBC WITH DIFFERENTIAL/PLATELET
Abs Immature Granulocytes: 0.03 10*3/uL (ref 0.00–0.07)
Basophils Absolute: 0.1 10*3/uL (ref 0.0–0.1)
Basophils Relative: 1 %
Eosinophils Absolute: 2.8 10*3/uL — ABNORMAL HIGH (ref 0.0–0.5)
Eosinophils Relative: 34 %
HCT: 39.5 % (ref 36.0–46.0)
Hemoglobin: 12 g/dL (ref 12.0–15.0)
Immature Granulocytes: 0 %
Lymphocytes Relative: 9 %
Lymphs Abs: 0.8 10*3/uL (ref 0.7–4.0)
MCH: 28.3 pg (ref 26.0–34.0)
MCHC: 30.4 g/dL (ref 30.0–36.0)
MCV: 93.2 fL (ref 80.0–100.0)
Monocytes Absolute: 0.5 10*3/uL (ref 0.1–1.0)
Monocytes Relative: 6 %
Neutro Abs: 4 10*3/uL (ref 1.7–7.7)
Neutrophils Relative %: 50 %
Platelets: 298 10*3/uL (ref 150–400)
RBC: 4.24 MIL/uL (ref 3.87–5.11)
RDW: 15.4 % (ref 11.5–15.5)
WBC: 8.2 10*3/uL (ref 4.0–10.5)
nRBC: 0 % (ref 0.0–0.2)

## 2019-06-14 LAB — CREATININE, SERUM
Creatinine, Ser: 0.53 mg/dL (ref 0.44–1.00)
GFR calc Af Amer: >60 mL/min (ref >60)
GFR calc non Af Amer: >60 mL/min (ref >60)

## 2019-06-14 LAB — CBC
HCT: 43.3 % (ref 36.0–46.0)
Hemoglobin: 13.2 g/dL (ref 12.0–15.0)
MCH: 28.4 pg (ref 26.0–34.0)
MCHC: 30.5 g/dL (ref 30.0–36.0)
MCV: 93.1 fL (ref 80.0–100.0)
Platelets: 327 10*3/uL (ref 150–400)
RBC: 4.65 MIL/uL (ref 3.87–5.11)
RDW: 15.4 % (ref 11.5–15.5)
WBC: 6.2 10*3/uL (ref 4.0–10.5)
nRBC: 0 % (ref 0.0–0.2)

## 2019-06-14 LAB — COMPREHENSIVE METABOLIC PANEL
ALT: 19 U/L (ref 0–44)
AST: 23 U/L (ref 15–41)
Albumin: 4.3 g/dL (ref 3.5–5.0)
Alkaline Phosphatase: 51 U/L (ref 38–126)
Anion gap: 12 (ref 5–15)
BUN: 8 mg/dL (ref 8–23)
CO2: 34 mmol/L — ABNORMAL HIGH (ref 22–32)
Calcium: 9.9 mg/dL (ref 8.9–10.3)
Chloride: 92 mmol/L — ABNORMAL LOW (ref 98–111)
Creatinine, Ser: 0.53 mg/dL (ref 0.44–1.00)
GFR calc Af Amer: >60 mL/min (ref >60)
GFR calc non Af Amer: >60 mL/min (ref >60)
Glucose, Bld: 102 mg/dL — ABNORMAL HIGH (ref 70–99)
Potassium: 3.2 mmol/L — ABNORMAL LOW (ref 3.5–5.1)
Sodium: 138 mmol/L (ref 135–145)
Total Bilirubin: 0.9 mg/dL (ref 0.3–1.2)
Total Protein: 6.7 g/dL (ref 6.5–8.1)

## 2019-06-14 LAB — PROCALCITONIN: Procalcitonin: <0.1 ng/mL

## 2019-06-14 LAB — PATHOLOGIST SMEAR REVIEW

## 2019-06-14 LAB — SARS CORONAVIRUS 2 BY RT PCR (HOSPITAL ORDER, PERFORMED IN ~~LOC~~ HOSPITAL LAB): SARS Coronavirus 2: NEGATIVE

## 2019-06-14 MED ORDER — BENZONATATE 100 MG PO CAPS
100.0000 mg | ORAL_CAPSULE | Freq: Three times a day (TID) | ORAL | Status: DC
  Administered 2019-06-14 – 2019-06-16 (×6): 100 mg via ORAL
  Filled 2019-06-14 (×6): qty 1

## 2019-06-14 MED ORDER — OXYCODONE-ACETAMINOPHEN 5-325 MG PO TABS: 1.0000 | ORAL_TABLET | Freq: Two times a day (BID) | ORAL | Status: DC | PRN

## 2019-06-14 MED ORDER — PREDNISONE 20 MG PO TABS
40.0000 mg | ORAL_TABLET | Freq: Every day | ORAL | Status: DC
  Administered 2019-06-15 – 2019-06-16 (×2): 40 mg via ORAL
  Filled 2019-06-14 (×2): qty 2

## 2019-06-14 MED ORDER — POTASSIUM CHLORIDE CRYS ER 20 MEQ PO TBCR
40.0000 meq | EXTENDED_RELEASE_TABLET | Freq: Once | ORAL | Status: AC
  Administered 2019-06-14: 40 meq via ORAL
  Filled 2019-06-14: qty 2

## 2019-06-14 MED ORDER — LISINOPRIL 20 MG PO TABS
20.0000 mg | ORAL_TABLET | Freq: Every day | ORAL | Status: DC
  Administered 2019-06-14 – 2019-06-15 (×2): 20 mg via ORAL
  Filled 2019-06-14 (×2): qty 1

## 2019-06-14 MED ORDER — ALBUTEROL SULFATE HFA 108 (90 BASE) MCG/ACT IN AERS
4.0000 | INHALATION_SPRAY | Freq: Once | RESPIRATORY_TRACT | Status: AC
  Administered 2019-06-14: 4 via RESPIRATORY_TRACT
  Filled 2019-06-14: qty 6.7

## 2019-06-14 MED ORDER — IPRATROPIUM-ALBUTEROL 0.5-2.5 (3) MG/3ML IN SOLN
3.0000 mL | Freq: Once | RESPIRATORY_TRACT | Status: AC
  Administered 2019-06-14: 3 mL via RESPIRATORY_TRACT
  Filled 2019-06-14: qty 3

## 2019-06-14 MED ORDER — ENOXAPARIN SODIUM 40 MG/0.4ML ~~LOC~~ SOLN
40.0000 mg | SUBCUTANEOUS | Status: DC
  Administered 2019-06-14 – 2019-06-15 (×2): 40 mg via SUBCUTANEOUS
  Filled 2019-06-14 (×2): qty 0.4

## 2019-06-14 MED ORDER — CITALOPRAM HYDROBROMIDE 20 MG PO TABS
20.0000 mg | ORAL_TABLET | Freq: Every day | ORAL | Status: DC
  Administered 2019-06-14 – 2019-06-16 (×3): 20 mg via ORAL
  Filled 2019-06-14 (×3): qty 1

## 2019-06-14 MED ORDER — LISINOPRIL-HYDROCHLOROTHIAZIDE 20-25 MG PO TABS: 1.0000 | ORAL_TABLET | Freq: Every day | ORAL | Status: DC

## 2019-06-14 MED ORDER — TIZANIDINE HCL 4 MG PO TABS: 2.0000 mg | ORAL_TABLET | Freq: Four times a day (QID) | ORAL | Status: DC | PRN

## 2019-06-14 MED ORDER — HYDROCHLOROTHIAZIDE 25 MG PO TABS
25.0000 mg | ORAL_TABLET | Freq: Every day | ORAL | Status: DC
  Filled 2019-06-14: qty 1

## 2019-06-14 MED ORDER — IPRATROPIUM-ALBUTEROL 0.5-2.5 (3) MG/3ML IN SOLN: 3.0000 mL | Freq: Four times a day (QID) | RESPIRATORY_TRACT | Status: DC | PRN

## 2019-06-14 MED ORDER — PREDNISONE 20 MG PO TABS
60.0000 mg | ORAL_TABLET | Freq: Once | ORAL | Status: AC
  Administered 2019-06-14: 60 mg via ORAL
  Filled 2019-06-14: qty 3

## 2019-06-14 MED ORDER — LORATADINE 10 MG PO TABS
10.0000 mg | ORAL_TABLET | Freq: Every day | ORAL | Status: DC
  Administered 2019-06-14 – 2019-06-16 (×3): 10 mg via ORAL
  Filled 2019-06-14 (×3): qty 1

## 2019-06-14 NOTE — ED Notes (Signed)
X-ray at bedside

## 2019-06-14 NOTE — ED Notes (Signed)
Patients 02 dropped from 92% to 85% on RA. Patient placed back on 2 litter [place].

## 2019-06-14 NOTE — ED Provider Notes (Signed)
  Face-to-face evaluation   History: She presents for evaluation of shortness of breath.  She complains of cough productive of white sputum.  She does not use oxygen at home.  She is an ex-smoker.  Physical exam: Alert elderly female.  No respiratory distress.  Lungs with decreased air movement bilaterally few scattered wheezes and rhonchi.  Medical screening examination/treatment/procedure(s) were conducted as a shared visit with non-physician practitioner(s) and myself.  I personally evaluated the patient during the encounter    Daleen Bo, MD 06/14/19 1745

## 2019-06-14 NOTE — ED Notes (Signed)
Room air sats-80%. Noted that nasal cannula was on the patient, but no oxygen on. O2 2L/min on patient and sats increased to 95%.

## 2019-06-14 NOTE — Care Management Obs Status (Signed)
[place] NOTIFICATION   Patient Details  Name: Chloe Wilcox MRN: [id number] Date of Birth: November 22, 1935   Medicare Observation Status Notification Given:  Yes    MahabirJuliann Pulse, RN 06/14/2019, 4:06 PM

## 2019-06-14 NOTE — ED Triage Notes (Signed)
Pt been SOB for week. Reports that her daughter been giving her breathing treatments on her grand baby's machine which helped. Reports cough with clear phlegm.

## 2019-06-14 NOTE — H&P (Addendum)
History and Physical    Chloe Wilcox KJZ:[id number] DOB: [birth year] DOA: 06/14/2019  PCP: [name], MD  Patient coming from: Home  I have personally briefly reviewed patient's old medical records in [place]  Chief Complaint: Shortness of breath  HPI: Chloe Wilcox is a 84 y.o. female with medical history significant of hypertension, depression and ex-smoker presented to emergency department with complaint of shortness of breath.  According to patient, she started feeling shortness of breath along with some cough about a week ago.  Cough was dry in the beginning but has been productive of white sputum since last 2 to 3 days.  She denies any fever, chills, sweating, chest pain, shortness of breath, nausea, vomiting, headache, dizziness, any problem with urination or with bowel movements.  She denies any sick contact or any recent travel.  She denies any recent exposure to smoking either.  She denies any prior diagnosis of lung disease or COPD.  ED Course: Upon initial presentation to the ED, she was hemodynamically stable and was saturating 91% on room air but she was walked after that and dropped her oxygen saturation to 85% on room air requiring 2 L of oxygen.  Further work-up was done which included CBC which was unremarkable and CMP which shows hypokalemia.  Chest x-ray was unremarkable.  COVID-19 negative.  According to ED physician report, she was wheezy on examination so she was given Solu-Medrol.  Hospital service was requested to admit the patient for acute hypoxic respiratory failure.  Review of Systems: As per HPI otherwise 10 point review of systems negative.    Past Medical History:  Diagnosis Date  . Depression   . Hypertension     History reviewed. No pertinent surgical history.   reports that she has quit smoking. She has never used smokeless tobacco. She reports that she does not drink alcohol or use drugs.  No Known Allergies  Family History  Family  history unknown: Yes    Prior to Admission medications   Medication Sig Start Date End Date Taking? Authorizing Provider  benzonatate (TESSALON) 100 MG capsule Take 1 capsule (100 mg total) by mouth every 8 (eight) hours. 01/07/17   Larene Pickett, PA-C  cetirizine (ZYRTEC) 10 MG tablet Take 10 mg by mouth daily.    [provider]  citalopram (CELEXA) 20 MG tablet Take 20 mg by mouth daily.    [provider]  diclofenac (VOLTAREN) 75 MG EC tablet Take 75 mg by mouth 2 (two) times daily.    [provider]  lisinopril-hydrochlorothiazide (PRINZIDE,ZESTORETIC) 20-25 MG tablet Take 1 tablet by mouth daily.    [provider]  Multiple Vitamin (MULTIVITAMIN) tablet Take 1 tablet by mouth daily.    [provider]  oxyCODONE-acetaminophen (PERCOCET/ROXICET) 5-325 MG tablet Take 1 tablet by mouth 2 (two) times daily as needed for moderate pain.  01/02/17   [provider]  potassium chloride (MICRO-K) 10 MEQ CR capsule Take 10 mEq by mouth daily.    [provider]  tiZANidine (ZANAFLEX) 2 MG tablet Take 2 mg by mouth every 6 (six) hours as needed for muscle spasms.    [provider]  traMADol (ULTRAM) 50 MG tablet Take 50 mg by mouth daily as needed for moderate pain.    [provider]    Physical Exam: Vitals:   06/14/19 1221 06/14/19 1314 06/14/19 1358 06/14/19 1400  BP:   (!) 174/80   Pulse: 75 71 99  Resp:   17 18  Temp:      TempSrc:      SpO2: 98% 98% 95%     Constitutional: NAD, calm, comfortable Vitals:   06/14/19 1221 06/14/19 1314 06/14/19 1358 06/14/19 1400  BP:   (!) 174/80   Pulse: 75 71 99   Resp:   17 18  Temp:      TempSrc:      SpO2: 98% 98% 95%    Eyes: PERRL, lids and conjunctivae normal, very thin ENMT: Mucous membranes are moist. Posterior pharynx clear of any exudate or lesions.Normal dentition.  Neck: normal, supple, no masses, no thyromegaly Respiratory: Wheezes in the  middle lobe bilateral, right more than the left, no crackles. Normal respiratory effort. No accessory muscle use.  Cardiovascular: Regular rate and rhythm, no murmurs / rubs / gallops. No extremity edema. 2+ pedal pulses. No carotid bruits.  Abdomen: no tenderness, no masses palpated. No hepatosplenomegaly. Bowel sounds positive.  Musculoskeletal: no clubbing / cyanosis. No joint deformity upper and lower extremities. Good ROM, no contractures. Normal muscle tone.  Skin: no rashes, lesions, ulcers. No induration Neurologic: CN 2-12 grossly intact. Sensation intact, DTR normal. Strength 5/5 in all 4.  Psychiatric: Normal judgment and insight. Alert and oriented x 3. Normal mood.    Labs on Admission: I have personally reviewed following labs and imaging studies  CBC: Recent Labs  Lab 06/14/19 0946  WBC 8.2  NEUTROABS 4.0  HGB 12.0  HCT 39.5  MCV 93.2  PLT 643   Basic Metabolic Panel: Recent Labs  Lab 06/14/19 0946  NA 138  K 3.2*  CL 92*  CO2 34*  GLUCOSE 102*  BUN 8  CREATININE 0.53  CALCIUM 9.9   GFR: CrCl cannot be calculated (Unknown ideal weight.). Liver Function Tests: Recent Labs  Lab 06/14/19 0946  AST 23  ALT 19  ALKPHOS 51  BILITOT 0.9  PROT 6.7  ALBUMIN 4.3   No results for input(s): LIPASE, AMYLASE in the last 168 hours. No results for input(s): AMMONIA in the last 168 hours. Coagulation Profile: No results for input(s): INR, PROTIME in the last 168 hours. Cardiac Enzymes: No results for input(s): CKTOTAL, CKMB, CKMBINDEX, TROPONINI in the last 168 hours. BNP (last 3 results) No results for input(s): PROBNP in the last 8760 hours. HbA1C: No results for input(s): HGBA1C in the last 72 hours. CBG: No results for input(s): GLUCAP in the last 168 hours. Lipid Profile: No results for input(s): CHOL, HDL, LDLCALC, TRIG, CHOLHDL, LDLDIRECT in the last 72 hours. Thyroid Function Tests: No results for input(s): TSH, T4TOTAL, FREET4, T3FREE, THYROIDAB  in the last 72 hours. Anemia Panel: No results for input(s): VITAMINB12, FOLATE, FERRITIN, TIBC, IRON, RETICCTPCT in the last 72 hours. Urine analysis: No results found for: COLORURINE, APPEARANCEUR, LABSPEC, PHURINE, GLUCOSEU, HGBUR, BILIRUBINUR, KETONESUR, PROTEINUR, UROBILINOGEN, NITRITE, LEUKOCYTESUR  Radiological Exams on Admission: Dg Chest Port 1 View  Result Date: 06/14/2019 CLINICAL DATA:  Shortness of breath for 2 weeks EXAM: PORTABLE CHEST 1 VIEW COMPARISON:  06/06/2018 FINDINGS: Cardiac shadow is stable. Aortic calcifications are seen. The lungs are well aerated bilaterally. No focal infiltrate or sizable effusion is seen. No bony abnormality is noted. IMPRESSION: No acute abnormality noted. Electronically Signed   By: Inez Catalina M.D.   On: 06/14/2019 10:33    EKG: Independently reviewed.  Sinus rhythm with no acute ST-T wave changes.  Assessment/Plan Active Problems:   Acute respiratory failure with hypoxia (HCC)   COPD exacerbation (HCC)  COPD with acute bronchitis ([place])   Hypokalemia   Essential hypertension     Acute hypoxic respiratory failure/ ?  Undiagnosed COPD exacerbation: She has history of smoking and quit about 40 years ago.  Has never been diagnosed with COPD, I am not sure if she sees any primary care physician.  She denies smoking currently and denies any passive smoking either.  She is not on any inhalers.  Chest x-ray shows elongated lung fields and also has wheezes on exam.  I wonder if she has undiagnosed COPD and surely will benefit from outpatient pulmonary function test.  She received a dose of Solu-Medrol.  I will continue prednisone 40 mg p.o. starting tomorrow.  No indication of antibiotics.  No leukocytosis.  Afebrile.  Will check procalcitonin as well as respiratory viral panel.  COVID-19 negative.  Essential hypertension: Pressure control.  Resume home medications.  Hypokalemia: 3.2.  Will replace orally and recheck in the morning.  DVT  prophylaxis: Lovenox Code Status: Full code Family Communication: None present at bedside.  Patient alert, oriented and competent.  Discussed plan of care with the patient. Disposition Plan: Likely home in next 24 hours. Consults called: None Admission status: Observation   Darliss Cheney MD [facility] Pager [phone]  If 7PM-7AM, please contact night-coverage [email or web address] Password [email or web address]  06/14/2019, 2:23 PM

## 2019-06-14 NOTE — ED Provider Notes (Signed)
[facility] DEPT Provider Note   CSN: [id number] Arrival date & time: 06/14/19  0908    History   Chief Complaint Chief Complaint  Patient presents with  . Shortness of Breath    HPI Chloe Wilcox is a 84 y.o. female with a PMH of HTN, depression, and CVA presenting with constant shortness of breath onset 1 week ago. Daughter is a contributing historian via phone. Patient reports she has had an intermittent productive cough. Patient states symptoms are worse with talking and better with rest. Daughter states she has provided her child's albuterol treatment with relief. Daughter states patient was evaluated by PCP and prescribed amoxicillin. Daughter states patient has only been taking antibiotic for a few days and has an additional 10 days remaining. Daughter states patient does not wear oxygen at home and denies any pulmonary or cardiac problems in the past. Daughter reports occasional congestion and rhinorrhea. Daughter denies fever, chills, chest pain, nausea, vomiting, abdominal pain, or diarrhea. Patient denies tobacco, alcohol, or drug use. Patient lives with daughter.      HPI  History reviewed. No pertinent past medical history.  Patient Active Problem List   Diagnosis Date Noted  . Acute respiratory failure with hypoxia ([place]) 06/14/2019    History reviewed. No pertinent surgical history.   OB History   No obstetric history on file.      Home Medications    Prior to Admission medications   Medication Sig Start Date End Date Taking? Authorizing Provider  benzonatate (TESSALON) 100 MG capsule Take 1 capsule (100 mg total) by mouth every 8 (eight) hours. 01/07/17   Larene Pickett, PA-C  cetirizine (ZYRTEC) 10 MG tablet Take 10 mg by mouth daily.    [provider]  citalopram (CELEXA) 20 MG tablet Take 20 mg by mouth daily.    [provider]  diclofenac (VOLTAREN) 75 MG EC tablet Take 75 mg by mouth 2 (two) times  daily.    [provider]  lisinopril-hydrochlorothiazide (PRINZIDE,ZESTORETIC) 20-25 MG tablet Take 1 tablet by mouth daily.    [provider]  Multiple Vitamin (MULTIVITAMIN) tablet Take 1 tablet by mouth daily.    [provider]  oxyCODONE-acetaminophen (PERCOCET/ROXICET) 5-325 MG tablet Take 1 tablet by mouth 2 (two) times daily as needed for moderate pain.  01/02/17   [provider]  potassium chloride (MICRO-K) 10 MEQ CR capsule Take 10 mEq by mouth daily.    [provider]  tiZANidine (ZANAFLEX) 2 MG tablet Take 2 mg by mouth every 6 (six) hours as needed for muscle spasms.    [provider]  traMADol (ULTRAM) 50 MG tablet Take 50 mg by mouth daily as needed for moderate pain.    [provider]    Family History No family history on file.  Social History Social History   Tobacco Use  . Smoking status: Former Research scientist (life sciences)  . Smokeless tobacco: Never Used  Substance Use Topics  . Alcohol use: No  . Drug use: No     Allergies   Patient has no known allergies.   Review of Systems Review of Systems  Constitutional: Negative for chills, diaphoresis, fatigue, fever and unexpected weight change.  HENT: Positive for congestion and rhinorrhea. Negative for sore throat and trouble swallowing.   Eyes: Negative for visual disturbance.  Respiratory: Positive for cough and shortness of breath. Negative for chest tightness, wheezing and stridor.   Cardiovascular: Negative for chest pain, palpitations and leg swelling.  Gastrointestinal: Negative for abdominal pain, blood in stool, diarrhea, nausea and vomiting.  Endocrine: Negative for cold intolerance and heat intolerance.  Genitourinary: Negative for dysuria.  Musculoskeletal: Negative for gait problem.  Skin: Negative for pallor and rash.  Allergic/Immunologic: Negative for environmental allergies, food allergies and immunocompromised state.  Neurological: Negative for  dizziness, syncope, speech difficulty, weakness, light-headedness and numbness.  Psychiatric/Behavioral: The patient is not nervous/anxious.      Physical Exam Updated Vital Signs BP (!) 156/69   Pulse 71   Temp 98.4 F (36.9 C) (Oral)   Resp 18   SpO2 98%   Physical Exam Vitals signs and nursing note reviewed.  Constitutional:      General: She is not in acute distress.    Appearance: She is well-developed. She is not diaphoretic.  HENT:     Head: Normocephalic and atraumatic.  Neck:     Musculoskeletal: Normal range of motion and neck supple.     Vascular: No JVD.  Cardiovascular:     Rate and Rhythm: Normal rate and regular rhythm.     Pulses: Normal pulses.          Radial pulses are 2+ on the right side and 2+ on the left side.       Dorsalis pedis pulses are 2+ on the right side and 2+ on the left side.     Heart sounds: Normal heart sounds. No murmur. No friction rub. No gallop.   Pulmonary:     Effort: Pulmonary effort is normal. No respiratory distress.     Breath sounds: Examination of the right-upper field reveals wheezing. Examination of the left-upper field reveals wheezing. Examination of the right-middle field reveals wheezing. Examination of the left-middle field reveals wheezing. Wheezing present. No rhonchi or rales.  Chest:     Chest wall: No tenderness.  Abdominal:     Palpations: Abdomen is soft.     Tenderness: There is no abdominal tenderness.  Musculoskeletal: Normal range of motion.     Right lower leg: She exhibits no tenderness. No edema.     Left lower leg: She exhibits no tenderness. No edema.  Skin:    Capillary Refill: Capillary refill takes less than 2 seconds.     Coloration: Skin is not pale.     Findings: No rash.  Neurological:     Mental Status: She is alert.      ED Treatments / Results  Labs (all labs ordered are listed, but only abnormal results are displayed) Labs Reviewed  CBC WITH DIFFERENTIAL/PLATELET - Abnormal;  Notable for the following components:      Result Value   Eosinophils Absolute 2.8 (*)    All other components within normal limits  COMPREHENSIVE METABOLIC PANEL - Abnormal; Notable for the following components:   Potassium 3.2 (*)    Chloride 92 (*)    CO2 34 (*)    Glucose, Bld 102 (*)    All other components within normal limits  SARS CORONAVIRUS 2 (HOSPITAL ORDER, [facility])  PATHOLOGIST SMEAR REVIEW    EKG EKG Interpretation  Date/Time:  Friday June 14 2019 09:37:46 EDT Ventricular Rate:  78 PR Interval:    QRS Duration: 88 QT Interval:  387 QTC Calculation: 441 R Axis:   65 Text Interpretation:  Sinus rhythm slt Confirmed by Daleen Bo [phone]) on 06/14/2019 9:47:44 AM   Radiology Dg Chest Port 1 View  Result Date: 06/14/2019 CLINICAL DATA:  Shortness of breath for 2  weeks EXAM: PORTABLE CHEST 1 VIEW COMPARISON:  06/06/2018 FINDINGS: Cardiac shadow is stable. Aortic calcifications are seen. The lungs are well aerated bilaterally. No focal infiltrate or sizable effusion is seen. No bony abnormality is noted. IMPRESSION: No acute abnormality noted. Electronically Signed   By: Inez Catalina M.D.   On: 06/14/2019 10:33    Procedures Procedures (including critical care time)  Medications Ordered in ED Medications  albuterol (VENTOLIN HFA) 108 (90 Base) MCG/ACT inhaler 4 puff (4 puffs Inhalation Given 06/14/19 1000)  ipratropium-albuterol (DUONEB) 0.5-2.5 (3) MG/3ML nebulizer solution 3 mL (3 mLs Nebulization Given 06/14/19 1142)  predniSONE (DELTASONE) tablet 60 mg (60 mg Oral Given 06/14/19 1202)     Initial Impression / Assessment and Plan / ED Course  I have reviewed the triage vital signs and the nursing notes.  Pertinent labs & imaging results that were available during my care of the patient were reviewed by me and considered in my medical decision making (see chart for details).  Clinical Course as of Jun 13 1401  Fri Jun 14, 2019   1044 No acute abnormality noted on CXR.  DG Chest Port 1 View [AH]  1106 WBCs are within normal limits.   CBC with Differential(!) [AH]  1346 Reassessed patient. Patient's oxygen saturation dropped to 85% on room air. Patient continues to require 2L of oxygen.   [AH]    Clinical Course User Index [AH] Arville Lime, PA-C      Patient presents with shortness of breath. CXR is negative for acute abnormality. WBCs within normal limits. COVID-19 test is negative. EKG without acute changes. Provided albuterol, duoneb, and steroids in the ER. Patient's oxygen saturation dropped to 85% on room air and has persistently required 2L of oxygen. Patient will need admission for hypoxia. Consulted hospitalist for admission. Hospitalist has agreed to admit patient. Updated and discussed plan with daughter. Daughter and patient state they understand and agree with plan.   Findings and plan of care discussed with supervising physician Dr. Eulis Foster who personally evaluated and examined this patient.  Final Clinical Impressions(s) / ED Diagnoses   Final diagnoses:  SOB (shortness of breath)  Hypoxia    ED Discharge Orders    None       Arville Lime, [place] 06/14/19 1403    Daleen Bo, MD 06/14/19 1745

## 2019-06-14 NOTE — ED Notes (Signed)
Patient placed on [place] at 2 litters.

## 2019-06-14 NOTE — ED Notes (Addendum)
Patient ambulated with cane to restroom and down hall. Patient stayed between 89%-92% on RA.  Patient placed back on 2 litters and now at 98%.

## 2019-06-15 ENCOUNTER — Inpatient Hospital Stay (HOSPITAL_COMMUNITY): Payer: Medicare Other

## 2019-06-15 DIAGNOSIS — F329 Major depressive disorder, single episode, unspecified: Secondary | ICD-10-CM | POA: Diagnosis present

## 2019-06-15 DIAGNOSIS — I361 Nonrheumatic tricuspid (valve) insufficiency: Secondary | ICD-10-CM

## 2019-06-15 DIAGNOSIS — Z1159 Encounter for screening for other viral diseases: Secondary | ICD-10-CM | POA: Diagnosis not present

## 2019-06-15 DIAGNOSIS — Z8673 Personal history of transient ischemic attack (TIA), and cerebral infarction without residual deficits: Secondary | ICD-10-CM | POA: Diagnosis not present

## 2019-06-15 DIAGNOSIS — E876 Hypokalemia: Secondary | ICD-10-CM | POA: Diagnosis present

## 2019-06-15 DIAGNOSIS — J9690 Respiratory failure, unspecified, unspecified whether with hypoxia or hypercapnia: Secondary | ICD-10-CM | POA: Diagnosis present

## 2019-06-15 DIAGNOSIS — Z87891 Personal history of nicotine dependence: Secondary | ICD-10-CM | POA: Diagnosis not present

## 2019-06-15 DIAGNOSIS — J9601 Acute respiratory failure with hypoxia: Secondary | ICD-10-CM | POA: Diagnosis present

## 2019-06-15 DIAGNOSIS — J440 Chronic obstructive pulmonary disease with acute lower respiratory infection: Secondary | ICD-10-CM | POA: Diagnosis present

## 2019-06-15 DIAGNOSIS — I10 Essential (primary) hypertension: Secondary | ICD-10-CM | POA: Diagnosis present

## 2019-06-15 DIAGNOSIS — J209 Acute bronchitis, unspecified: Secondary | ICD-10-CM | POA: Diagnosis present

## 2019-06-15 DIAGNOSIS — R0602 Shortness of breath: Secondary | ICD-10-CM | POA: Diagnosis present

## 2019-06-15 DIAGNOSIS — J441 Chronic obstructive pulmonary disease with (acute) exacerbation: Secondary | ICD-10-CM | POA: Diagnosis present

## 2019-06-15 LAB — RESPIRATORY PANEL BY PCR

## 2019-06-15 LAB — COMPREHENSIVE METABOLIC PANEL
ALT: 18 U/L (ref 0–44)
AST: 19 U/L (ref 15–41)
Albumin: 3.9 g/dL (ref 3.5–5.0)
Alkaline Phosphatase: 49 U/L (ref 38–126)
Anion gap: 9 (ref 5–15)
BUN: 10 mg/dL (ref 8–23)
CO2: 34 mmol/L — ABNORMAL HIGH (ref 22–32)
Calcium: 9.7 mg/dL (ref 8.9–10.3)
Chloride: 97 mmol/L — ABNORMAL LOW (ref 98–111)
Creatinine, Ser: 0.58 mg/dL (ref 0.44–1.00)
GFR calc Af Amer: >60 mL/min (ref >60)
GFR calc non Af Amer: >60 mL/min (ref >60)
Glucose, Bld: 96 mg/dL (ref 70–99)
Potassium: 3.9 mmol/L (ref 3.5–5.1)
Sodium: 140 mmol/L (ref 135–145)
Total Bilirubin: 0.9 mg/dL (ref 0.3–1.2)
Total Protein: 6.3 g/dL — ABNORMAL LOW (ref 6.5–8.1)

## 2019-06-15 LAB — BRAIN NATRIURETIC PEPTIDE: B Natriuretic Peptide: 177 pg/mL — ABNORMAL HIGH (ref 0.0–100.0)

## 2019-06-15 LAB — ECHOCARDIOGRAM COMPLETE: Weight: 1926.4 oz

## 2019-06-15 LAB — CBC WITH DIFFERENTIAL/PLATELET
Abs Immature Granulocytes: 0.03 10*3/uL (ref 0.00–0.07)
Basophils Absolute: 0 10*3/uL (ref 0.0–0.1)
Basophils Relative: 1 %
Eosinophils Absolute: 0.2 10*3/uL (ref 0.0–0.5)
Eosinophils Relative: 3 %
HCT: 40.1 % (ref 36.0–46.0)
Hemoglobin: 12.2 g/dL (ref 12.0–15.0)
Immature Granulocytes: 1 %
Lymphocytes Relative: 14 %
Lymphs Abs: 0.8 10*3/uL (ref 0.7–4.0)
MCH: 28.4 pg (ref 26.0–34.0)
MCHC: 30.4 g/dL (ref 30.0–36.0)
MCV: 93.3 fL (ref 80.0–100.0)
Monocytes Absolute: 0.6 10*3/uL (ref 0.1–1.0)
Monocytes Relative: 11 %
Neutro Abs: 3.9 10*3/uL (ref 1.7–7.7)
Neutrophils Relative %: 70 %
Platelets: 292 10*3/uL (ref 150–400)
RBC: 4.3 MIL/uL (ref 3.87–5.11)
RDW: 15.4 % (ref 11.5–15.5)
WBC: 5.6 10*3/uL (ref 4.0–10.5)
nRBC: 0 % (ref 0.0–0.2)

## 2019-06-15 LAB — BLOOD GAS, ARTERIAL
Acid-Base Excess: 10.2 mmol/L — ABNORMAL HIGH (ref 0.0–2.0)
Bicarbonate: 35.2 mmol/L — ABNORMAL HIGH (ref 20.0–28.0)
Drawn by: 27021
O2 Content: 2 L/min
O2 Saturation: 89.7 %
Patient temperature: 98.6
pCO2 arterial: 49.6 mmHg — ABNORMAL HIGH (ref 32.0–48.0)
pH, Arterial: 7.465 — ABNORMAL HIGH (ref 7.350–7.450)
pO2, Arterial: 60.3 mmHg — ABNORMAL LOW (ref 83.0–108.0)

## 2019-06-15 LAB — D-DIMER, QUANTITATIVE: D-Dimer, Quant: 0.5 ug/mL-FEU (ref 0.00–0.50)

## 2019-06-15 LAB — HIV ANTIBODY (ROUTINE TESTING W REFLEX): HIV Screen 4th Generation wRfx: NONREACTIVE

## 2019-06-15 MED ORDER — POTASSIUM CHLORIDE CRYS ER 20 MEQ PO TBCR
40.0000 meq | EXTENDED_RELEASE_TABLET | Freq: Once | ORAL | Status: AC
  Administered 2019-06-15: 40 meq via ORAL
  Filled 2019-06-15: qty 2

## 2019-06-15 MED ORDER — DOXYCYCLINE HYCLATE 100 MG PO TABS
100.0000 mg | ORAL_TABLET | Freq: Two times a day (BID) | ORAL | Status: DC
  Administered 2019-06-15 – 2019-06-16 (×3): 100 mg via ORAL
  Filled 2019-06-15 (×3): qty 1

## 2019-06-15 MED ORDER — LISINOPRIL 20 MG PO TABS
20.0000 mg | ORAL_TABLET | Freq: Every day | ORAL | Status: DC
  Administered 2019-06-16: 20 mg via ORAL
  Filled 2019-06-15: qty 1

## 2019-06-15 MED ORDER — BUDESONIDE 0.25 MG/2ML IN SUSP
0.2500 mg | Freq: Two times a day (BID) | RESPIRATORY_TRACT | Status: DC
  Administered 2019-06-15: 0.25 mg via RESPIRATORY_TRACT
  Filled 2019-06-15 (×2): qty 2

## 2019-06-15 NOTE — Progress Notes (Signed)
Patients O2 dropped to 83% while on RA at rest.  2L O2 applied, increased to 93% Ambulated hallways with 2L O2 [place] applied and sats increased to 91%.  Patient still having some SOB, but states "its not as bad".

## 2019-06-15 NOTE — Progress Notes (Signed)
Patients daughter [name] called and updated

## 2019-06-15 NOTE — Plan of Care (Signed)
  Problem: Acute Rehab PT Goals(only PT should resolve) Goal: Patient Will Transfer Sit To/From Stand Outcome: Progressing Flowsheets (Taken 06/15/2019 1704) Patient will transfer sit to/from stand: Independently Goal: Pt Will Transfer Bed To Chair/Chair To Bed Outcome: Progressing Flowsheets (Taken 06/15/2019 1704) Pt will Transfer Bed to Chair/Chair to Bed: Independently Goal: Pt Will Ambulate Outcome: Progressing Flowsheets (Taken 06/15/2019 1704) Pt will Ambulate:  > 125 feet  with supervision Note: While managing portable O2  Goal: PT Additional Goal #1 Outcome: Progressing Flowsheets (Taken 06/15/2019 1704) Additional Goal #1: patient will demonstrate good management of portable O2 in her hospital room to demonstrate good safety awareness with obstacle negotiation/navigation

## 2019-06-15 NOTE — Progress Notes (Signed)
PROGRESS NOTE  Chloe Wilcox DVV:[id number] DOB: 03/05/1935 DOA: 06/14/2019 PCP: [name], MD   LOS: 0 days   Brief narrative: Chloe Wilcox is a 84 y.o. female with medical history significant of hypertension, depression and ex-smoker presented to emergency department with complaint of shortness of breath.  According to patient, she started feeling shortness of breath along with some cough for a week.  Cough was dry in the beginning but has been productive of white sputum for  2 to 3 days.  Upon initial presentation to the ED, she was hemodynamically stable and was saturating 91% on room air but she was walked after that and dropped her oxygen saturation to 85% on room air requiring 2 L of oxygen.  Further work-up was done which included CBC which was unremarkable and CMP which shows hypokalemia.  Chest x-ray was unremarkable.  COVID-19 was negative.  According to ED physician report, she was wheezy on examination so she was given Solu-Medrol.  Hospital service was requested to admit the patient for acute hypoxic respiratory failure.  Assessment/Plan:  Active Problems:   Acute respiratory failure with hypoxia (HCC)   COPD exacerbation (HCC)   COPD with acute bronchitis (HCC)   Hypokalemia   Essential hypertension  Acute hypoxic respiratory failure, present on admission.   Patient was hypoxic with oxygen saturation of 83% on room air at rest today and improved with 2 L.  Possibility of Undiagnosed COPD exacerbation causing hypoxic respiratory failure. She has history of smoking and quit about 40 years ago.  Has never been diagnosed with COPD, though she has been seen by primary care physician as outpatient.   Patient denies a smoking currently. Chest x-ray denies any infiltrate.  Patient will likely benefit from utpatient pulmonary function test.  She received a dose of Solu-Medrol in the ED and is currently on oral prednisone.  We will add p.o. doxycycline due to patient productive  sputum.  And Pulmicort nebs as well.  Patient is afebrile without leukocytosis.  Procalcitonin was negative.  Respiratory viral panel was negative as well.  COVID-19 negative.  Due to unclear hypoxia despite adequate treatment d-dimer was obtained which was negative.  BNP is mildly elevated.  2D echocardiogram pending.   Essential hypertension:   On lisinopril.  Blood pressure is still accelerated.  Will closely monitor.  Add low-dose amlodipine.  Hypokalemia: 3.2.  On presentation.  Improved with replacement.  VTE Prophylaxis: Lovenox  Code Status: Full code  Family Communication: None  Disposition Plan: Home likely in 1 to 2 days.  Patient has new onset hypoxic respiratory failure possibly from a COPD which is undiagnosed. She will need outpatient pulmonary function test for definite diagnosis.  Patient would need further treatment in the hospital including inhalers and nebulizers, steroids to optimize her breathing status and to see if she could be weaned off the oxygen prior to discharge.  If she cannot be weaned off oxygen by tomorrow, she would qualify for home oxygen on discharge.  At this time, we will change the patient's status to inpatient.   Consultants:  None  Procedures:  None  Antibiotics: Anti-infectives (From admission, onward)   None     Subjective: Today, patient denies chest pain, fever, chills or rigor.  She was noted to be hypoxic on room air by the nursing staff.  Patient denies overt dyspnea but has productive cough with whitish sputum production.  Objective: Vitals:   06/14/19 2021 06/15/19 0507  BP: (!) 141/70 (!) 169/91  Pulse: 79 87  Resp: 16 16  Temp: 98 F (36.7 C) 98.2 F (36.8 C)  SpO2: 97% 95%    Intake/Output Summary (Last 24 hours) at 06/15/2019 1221 Last data filed at 06/15/2019 0930 Gross per 24 hour  Intake 1200 ml  Output -  Net 1200 ml   Filed Weights   06/14/19 1459 06/15/19 0507  Weight: 54.7 kg 54.6 kg   Body mass index  is 20.04 kg/m.   Physical Exam: GENERAL: Patient is alert awake and oriented. Not in obvious distress.  On nasal cannula oxygen, thinly built HENT: No scleral pallor or icterus. Pupils equally reactive to light. Oral mucosa is moist NECK: is supple, no palpable thyroid enlargement. CHEST: . Diminished breath sounds bilaterally.  Occasional rhonchi. CVS: S1 and S2 heard, no murmur. Regular rate and rhythm. No pericardial rub. ABDOMEN: Soft, non-tender, bowel sounds are present. No palpable hepato-splenomegaly. EXTREMITIES: No edema. CNS: Cranial nerves are intact. No focal motor or sensory deficits. SKIN: warm and dry without rashes.  Data Review: I have personally reviewed the following laboratory data and studies,  CBC: Recent Labs  Lab 06/14/19 0946 06/14/19 1535 06/15/19 0638  WBC 8.2 6.2 5.6  NEUTROABS 4.0  --  3.9  HGB 12.0 13.2 12.2  HCT 39.5 43.3 40.1  MCV 93.2 93.1 93.3  PLT 298 327 580   Basic Metabolic Panel: Recent Labs  Lab 06/14/19 0946 06/14/19 1535 06/15/19 0638  NA 138  --  140  K 3.2*  --  3.9  CL 92*  --  97*  CO2 34*  --  34*  GLUCOSE 102*  --  96  BUN 8  --  10  CREATININE 0.53 0.53 0.58  CALCIUM 9.9  --  9.7   Liver Function Tests: Recent Labs  Lab 06/14/19 0946 06/15/19 0638  AST 23 19  ALT 19 18  ALKPHOS 51 49  BILITOT 0.9 0.9  PROT 6.7 6.3*  ALBUMIN 4.3 3.9   No results for input(s): LIPASE, AMYLASE in the last 168 hours. No results for input(s): AMMONIA in the last 168 hours. Cardiac Enzymes: No results for input(s): CKTOTAL, CKMB, CKMBINDEX, TROPONINI in the last 168 hours. BNP (last 3 results) No results for input(s): BNP in the last 8760 hours.  ProBNP (last 3 results) No results for input(s): PROBNP in the last 8760 hours.  CBG: No results for input(s): GLUCAP in the last 168 hours. Recent Results (from the past 240 hour(s))  SARS Coronavirus 2 (CEPHEID- Performed in [place] hospital lab), Hosp Order     Status:  None   Collection Time: 06/14/19 10:19 AM   Specimen: Nasopharyngeal Swab  Result Value Ref Range Status   SARS Coronavirus 2 NEGATIVE NEGATIVE Final    Comment: (NOTE) If result is NEGATIVE SARS-CoV-2 target nucleic acids are NOT DETECTED. The SARS-CoV-2 RNA is generally detectable in upper and lower  respiratory specimens during the acute phase of infection. The lowest  concentration of SARS-CoV-2 viral copies this assay can detect is 250  copies / mL. A negative result does not preclude SARS-CoV-2 infection  and should not be used as the sole basis for treatment or other  patient management decisions.  A negative result may occur with  improper specimen collection / handling, submission of specimen other  than nasopharyngeal swab, presence of viral mutation(s) within the  areas targeted by this assay, and inadequate number of viral copies  (<250 copies / mL). A negative result must be combined with  clinical  observations, patient history, and epidemiological information. If result is POSITIVE SARS-CoV-2 target nucleic acids are DETECTED. The SARS-CoV-2 RNA is generally detectable in upper and lower  respiratory specimens dur ing the acute phase of infection.  Positive  results are indicative of active infection with SARS-CoV-2.  Clinical  correlation with patient history and other diagnostic information is  necessary to determine patient infection status.  Positive results do  not rule out bacterial infection or co-infection with other viruses. If result is PRESUMPTIVE POSTIVE SARS-CoV-2 nucleic acids MAY BE PRESENT.   A presumptive positive result was obtained on the submitted specimen  and confirmed on repeat testing.  While 2019 novel coronavirus  (SARS-CoV-2) nucleic acids may be present in the submitted sample  additional confirmatory testing may be necessary for epidemiological  and / or clinical management purposes  to differentiate between  SARS-CoV-2 and other  Sarbecovirus currently known to infect humans.  If clinically indicated additional testing with an alternate test  methodology [phone]) is advised. The SARS-CoV-2 RNA is generally  detectable in upper and lower respiratory sp ecimens during the acute  phase of infection. The expected result is Negative. Fact Sheet for Patients:  StrictlyIdeas.no Fact Sheet for Healthcare Providers: BankingDealers.co.za This test is not yet approved or cleared by the [place] FDA and has been authorized for detection and/or diagnosis of SARS-CoV-2 by FDA under an Emergency Use Authorization (EUA).  This EUA will remain in effect (meaning this test can be used) for the duration of the COVID-19 declaration under Section 564(b)(1) of the Act, 21 U.S.C. section 360bbb-3(b)(1), unless the authorization is terminated or revoked sooner. Performed at [facility], [place] [address] [place], [place] [address]   Respiratory Panel by PCR     Status: None   Collection Time: 06/14/19  3:04 PM   Specimen: Nasopharyngeal Swab; Respiratory  Result Value Ref Range Status   Adenovirus NOT DETECTED NOT DETECTED Final   Coronavirus 229E NOT DETECTED NOT DETECTED Final    Comment: (NOTE) The Coronavirus on the Respiratory Panel, DOES NOT test for the novel  Coronavirus (2019 nCoV)    Coronavirus HKU1 NOT DETECTED NOT DETECTED Final   Coronavirus NL63 NOT DETECTED NOT DETECTED Final   Coronavirus OC43 NOT DETECTED NOT DETECTED Final   Metapneumovirus NOT DETECTED NOT DETECTED Final   Rhinovirus / Enterovirus NOT DETECTED NOT DETECTED Final   Influenza A NOT DETECTED NOT DETECTED Final   Influenza B NOT DETECTED NOT DETECTED Final   Parainfluenza Virus 1 NOT DETECTED NOT DETECTED Final   Parainfluenza Virus 2 NOT DETECTED NOT DETECTED Final   Parainfluenza Virus 3 NOT DETECTED NOT DETECTED Final   Parainfluenza Virus 4 NOT DETECTED NOT DETECTED  Final   Respiratory Syncytial Virus NOT DETECTED NOT DETECTED Final   Bordetella pertussis NOT DETECTED NOT DETECTED Final   Chlamydophila pneumoniae NOT DETECTED NOT DETECTED Final   Mycoplasma pneumoniae NOT DETECTED NOT DETECTED Final    Comment: Performed at [facility], [place]. [address] [place], [place] [address]     Studies: Dg Chest Port 1 View  Result Date: 06/14/2019 CLINICAL DATA:  Shortness of breath for 2 weeks EXAM: PORTABLE CHEST 1 VIEW COMPARISON:  06/06/2018 FINDINGS: Cardiac shadow is stable. Aortic calcifications are seen. The lungs are well aerated bilaterally. No focal infiltrate or sizable effusion is seen. No bony abnormality is noted. IMPRESSION: No acute abnormality noted. Electronically Signed   By: Inez Catalina M.D.   On: 06/14/2019  10:33    Scheduled Meds: . benzonatate  100 mg Oral Q8H  . citalopram  20 mg Oral Daily  . enoxaparin (LOVENOX) injection  40 mg Subcutaneous Q24H  . [START ON 06/16/2019] lisinopril  20 mg Oral Daily  . loratadine  10 mg Oral Daily  . predniSONE  40 mg Oral Q breakfast    Continuous Infusions:   Flora Lipps, MD  [facility] 06/15/2019

## 2019-06-15 NOTE — Progress Notes (Signed)
  Echocardiogram 2D Echocardiogram has been performed.  Chloe Wilcox M 06/15/2019, 3:16 PM

## 2019-06-15 NOTE — Progress Notes (Signed)
SATURATION QUALIFICATIONS: (This note is used to comply with regulatory documentation for home oxygen)  Patient Saturations on Room Air at Rest = 83%  Patient Saturations on Room Air while Ambulating =   Patient Saturations on 2 Liters of oxygen while Ambulating = 91%  Please briefly explain why patient needs home oxygen: saturations low at 83% while on RA at rest.  Saturations increased to 93% with 2L O2 [place] applied

## 2019-06-15 NOTE — Evaluation (Signed)
Physical Therapy Evaluation Patient Details Name: Chloe Wilcox MRN: [id number] DOB: November 26, 1935 Today's Date: 06/15/2019   History of Present Illness  Patient is 84 y.o. female with medical history significant of hypertension, depression and ex-smoker. She presented to emergency department with complaint of shortness of breath and was admitted for acute hypoxic respiratory failure.    Clinical Impression  Chloe Wilcox is a 84 y.o. female admitted for SOB with above diagnosis. Sh  is currently on 2 L/min O2 via nasal cannula to maintain SpO2 in normal ranges. Patient remained on O2 throughout session and was able to ambulate ~ 100 feet with SPC and min guard assist. She required cues ~ 25% of the time for safe sequencing with cane, no overt LOB observed. Pt with have difficulty with backwards gait and marching indicative of impaired balance in SLS and with higher level tasks. Patient is independent at home with [facility] but has not worn O2 previously. She will benefit from continued skilled PT interventions to progress mobility and provide safety training with line management while mobilizing with portable O2. Acute PT will follow during her stay.     Follow Up Recommendations Home health PT    Equipment Recommendations  None recommended by PT    Recommendations for Other Services       Precautions / Restrictions Precautions Precautions: Other (comment);Fall Precaution Comments: pt on 2 L/min O2 via nasal cannula Restrictions Weight Bearing Restrictions: No      Mobility  Bed Mobility Overal bed mobility: Needs Assistance Bed Mobility: Supine to Sit     Supine to sit: Modified independent (Device/Increase time);HOB elevated     General bed mobility comments: pt with extra time for transfer and HOB elevated  Transfers Overall transfer level: Needs assistance   Transfers: Sit to/from Stand Sit to Stand: Supervision      General transfer comment: sit <>stand from EOB, BSC, and  bedside recliner all require supervision and cues for safety and to pause upon standing rather than rush to step forward  Ambulation/Gait Ambulation/Gait assistance: Supervision;Min guard Gait Distance (Feet): 100 Feet Assistive device: Straight cane Gait Pattern/deviations: Step-through pattern;Decreased stride length;Decreased weight shift to right;Trunk flexed Gait velocity: slow   General Gait Details: pt with SPC in Lt UE and demonstrated good sequencing throughout ~ 75% of gait keeping cane with Rt LE.      Balance Overall balance assessment: Needs assistance Sitting-balance support: No upper extremity supported;Feet supported Sitting balance-Leahy Scale: Good     Standing balance support: Single extremity supported;During functional activity Standing balance-Leahy Scale: Fair Standing balance comment: pt maintained static balance without UE support. challenged pt with marching in place and she requires min guard assist for safety             High level balance activites: Backward walking High Level Balance Comments: pt performed backward walking with slower cadence and increased unsteadiness using SPC             Pertinent Vitals/Pain Pain Assessment: No/denies pain    Home Living Family/patient expects to be discharged to:: Private residence Living Arrangements: Children;Other (Comment)(patient's daughter and granddaughter) Available Help at Discharge: Family;Available 24 hours/day Type of Home: House Home Access: Stairs to enter;Ramped entrance   Entrance Stairs-Number of Steps: 3 Home Layout: One level Home Equipment: Walker - 2 wheels;Cane - single point;Walker - 4 wheels      Prior Function Level of Independence: Independent with assistive device(s)         Comments: pt  uses SPC at baseline to mobilize in her home and community, she used to go to store to shop for herself but her daughter has been doing that for her since [place] began     [place] Hand: Right    Extremity/Trunk Assessment   Upper Extremity Assessment Upper Extremity Assessment: Overall WFL for tasks assessed    Lower Extremity Assessment Lower Extremity Assessment: Overall WFL for tasks assessed    Cervical / Trunk Assessment Cervical / Trunk Assessment: Kyphotic  Communication   Communication: No difficulties  Cognition Arousal/Alertness: Awake/alert Behavior During Therapy: WFL for tasks assessed/performed Overall Cognitive Status: Within Functional Limits for tasks assessed                 Assessment/Plan    PT Assessment Patient needs continued PT services  PT Problem List Decreased strength;Decreased activity tolerance;Decreased balance;Decreased mobility       PT Treatment Interventions DME instruction;Gait training;Functional mobility training;Stair training;Patient/family education;Therapeutic exercise;Therapeutic activities;Balance training    PT Goals (Current goals can be found in the Care Plan section)  Acute Rehab PT Goals Patient Stated Goal: to return home PT Goal Formulation: With patient Time For Goal Achievement: 06/21/19 Potential to Achieve Goals: Good    Frequency Min 3X/week    AM-PAC PT "6 Clicks" Mobility  Outcome Measure Help needed turning from your back to your side while in a flat bed without using bedrails?: A Little Help needed moving from lying on your back to sitting on the side of a flat bed without using bedrails?: A Little Help needed moving to and from a bed to a chair (including a wheelchair)?: A Little Help needed standing up from a chair using your arms (e.g., wheelchair or bedside chair)?: A Little Help needed to walk in hospital room?: A Little Help needed climbing 3-5 steps with a railing? : A Little 6 Click Score: 18    End of Session Equipment Utilized During Treatment: Gait belt Activity Tolerance: Patient tolerated treatment well Patient left: in chair;with call  bell/phone within reach;with chair alarm set Nurse Communication: Mobility status PT Visit Diagnosis: Unsteadiness on feet (R26.81);Other abnormalities of gait and mobility (R26.89);Difficulty in walking, not elsewhere classified (R26.2)    Time: 8416-6063 PT Time Calculation (min) (ACUTE ONLY): 27 min   Charges:   PT Evaluation $PT Eval Low Complexity: 1 Low PT Treatments $Gait Training: 8-22 mins       Kipp Brood, PT, DPT, [facility] Physical Therapist with [facility]  06/15/2019 5:03 PM

## 2019-06-16 LAB — BASIC METABOLIC PANEL
Anion gap: 7 (ref 5–15)
BUN: 10 mg/dL (ref 8–23)
CO2: 33 mmol/L — ABNORMAL HIGH (ref 22–32)
Calcium: 9.5 mg/dL (ref 8.9–10.3)
Chloride: 100 mmol/L (ref 98–111)
Creatinine, Ser: 0.56 mg/dL (ref 0.44–1.00)
GFR calc Af Amer: >60 mL/min (ref >60)
GFR calc non Af Amer: >60 mL/min (ref >60)
Glucose, Bld: 90 mg/dL (ref 70–99)
Potassium: 3.8 mmol/L (ref 3.5–5.1)
Sodium: 140 mmol/L (ref 135–145)

## 2019-06-16 LAB — MAGNESIUM: Magnesium: 2 mg/dL (ref 1.7–2.4)

## 2019-06-16 LAB — CBC
HCT: 40.7 % (ref 36.0–46.0)
Hemoglobin: 11.9 g/dL — ABNORMAL LOW (ref 12.0–15.0)
MCH: 27.5 pg (ref 26.0–34.0)
MCHC: 29.2 g/dL — ABNORMAL LOW (ref 30.0–36.0)
MCV: 94 fL (ref 80.0–100.0)
Platelets: 328 10*3/uL (ref 150–400)
RBC: 4.33 MIL/uL (ref 3.87–5.11)
RDW: 15 % (ref 11.5–15.5)
WBC: 7.6 10*3/uL (ref 4.0–10.5)
nRBC: 0 % (ref 0.0–0.2)

## 2019-06-16 MED ORDER — ADULT MULTIVITAMIN W/MINERALS CH
1.0000 | ORAL_TABLET | ORAL | Status: DC
  Administered 2019-06-16: 1 via ORAL
  Filled 2019-06-16: qty 1

## 2019-06-16 MED ORDER — FLUTICASONE PROPIONATE HFA 110 MCG/ACT IN AERO: 2.0000 | INHALATION_SPRAY | Freq: Two times a day (BID) | RESPIRATORY_TRACT | 2 refills | Status: DC

## 2019-06-16 MED ORDER — ENSURE ENLIVE PO LIQD
237.0000 mL | ORAL | Status: DC
  Administered 2019-06-16: 237 mL via ORAL

## 2019-06-16 MED ORDER — SPIRIVA HANDIHALER 18 MCG IN CAPS: 18.0000 ug | ORAL_CAPSULE | Freq: Every day | RESPIRATORY_TRACT | 2 refills | Status: DC

## 2019-06-16 MED ORDER — ALBUTEROL SULFATE HFA 108 (90 BASE) MCG/ACT IN AERS: 2.0000 | INHALATION_SPRAY | Freq: Four times a day (QID) | RESPIRATORY_TRACT | 2 refills | Status: DC | PRN

## 2019-06-16 MED ORDER — PREDNISONE 20 MG PO TABS: 40.0000 mg | ORAL_TABLET | Freq: Every day | ORAL | 0 refills | Status: AC

## 2019-06-16 MED ORDER — DOXYCYCLINE HYCLATE 100 MG PO TABS: 100.0000 mg | ORAL_TABLET | Freq: Two times a day (BID) | ORAL | 0 refills | Status: AC

## 2019-06-16 NOTE — Progress Notes (Signed)
Patients discharge information discussed with patient, including all medications, oxygen to wear at home, and follow up plan. Also discussed with pts daughter and grand daughter. All personal belongings gathered and sent home with patient. IV removed, and patient had no further questions.

## 2019-06-16 NOTE — Progress Notes (Signed)
Initial Nutrition Assessment  RD working remotely.   DOCUMENTATION CODES:   Not applicable  INTERVENTION:  - will order Ensure Enlive once/day, each supplement provides 350 kcal and 20 grams of protein. - will order daily multivitamin with minerals. - continue to encourage PO intakes.    NUTRITION DIAGNOSIS:   Increased nutrient needs related to acute illness as evidenced by estimated needs.  GOAL:   Patient will meet greater than or equal to 90% of their needs  MONITOR:   PO intake, Supplement acceptance, Labs, Weight trends  REASON FOR ASSESSMENT:   Consult Assessment of nutrition requirement/status  ASSESSMENT:   84 y.o. female with a medical history of HTN, depression, and CVA. She presented to the ED with ongoing shortness of breath and intermittent productive cough x1 week. She reported that symptoms are worse with talking and better when she is resting. Daughter provided patient with albuterol treatment which provided some relief. Patient was recently seen by PCP and prescribed amoxicillin, which she took for the few days PTA and has 10 days remaining on regimen. She does not wear oxygen at home.  Patient lives with her daughter, who typically prepares meals. Patient reports good appetite at baseline which has been slightly decreased for the past few days to 1 week d/t shortness of breath and coughing. Per RN flow sheet, she consumed 50% of breakfast on 7/17 and 7/18, 25% of breakfast this AM. Patient denies any chewing or swallowing issues/difficulties.   She does not feel that her weight has changed in any way recently. Per chart review, current weight is 130 lb and the last recorded weight PTA was on 01/07/17 when she weighed 163 lb. This indicates 33 lb weight loss (20% body weight) in the past ~2.5 years; not significant for time frame.   Discharge order for d/c to home entered late this AM. Discharge summary has not yet been entered.  Medications reviewed; 40 mEq  K-dur/day x5 days starting 7/18, 40 mg deltasone/day.  Labs reviewed.     NUTRITION - FOCUSED PHYSICAL EXAM:  unable to complete at this time.   Diet Order:   Diet Order            Diet - low sodium heart healthy        Diet Heart Room service appropriate? Yes; Fluid consistency: Thin  Diet effective now              EDUCATION NEEDS:   No education needs have been identified at this time  Skin:  Skin Assessment: Reviewed RN Assessment  Last BM:  7/19  Height:   Ht Readings from Last 1 Encounters:  01/07/17 5\' 5"  (1.651 m)    Weight:   Wt Readings from Last 1 Encounters:  06/16/19 59 kg    Ideal Body Weight:  56.8 kg  BMI:  Body mass index is 21.64 kg/m.  Estimated Nutritional Needs:   Kcal:  1500-1700 kcal  Protein:  65-75 grams  Fluid:  >/= 1.8 L/day     Jarome Matin, MS, RD, LDN, [facility] Inpatient Clinical Dietitian Pager # [phone] After hours/weekend pager # [phone]

## 2019-06-16 NOTE — Evaluation (Signed)
Occupational Therapy Evaluation Patient Details Name: Chloe Wilcox MRN: [id number] DOB: Aug 21, 1935 Today's Date: 06/16/2019    History of Present Illness Patient is 84 y.o. female with medical history significant of hypertension, depression and ex-smoker. She presented to emergency department with complaint of shortness of breath and was admitted for acute hypoxic respiratory failure.   Clinical Impression   [name] HAS DECREASED STRENGTH AND IS REQUIRING S TO MIN GUARD ASSIST. PT. DOES HAVE 24 HOUR CARE AND ASSIST AS NEEDED. PT. WOULD BENEFIT FROM HHOT TO RETURN TO BASELINE MOD I. ACUTE OT TO FOLLOW. [name] IS MOTIVATED TO WORK WITH OT.     Follow Up Recommendations  Home health OT    Equipment Recommendations  None recommended by OT    Recommendations for Other Services       Precautions / Restrictions Precautions Precautions: Fall Restrictions Weight Bearing Restrictions: No      Mobility Bed Mobility                  Transfers Overall transfer level: Needs assistance Equipment used: 1 person hand held assist Transfers: Sit to/from Stand;Stand Pivot Transfers Sit to Stand: Supervision Stand pivot transfers: Min guard       General transfer comment: [name] IS MIN GUARD ASSIST WITH TRANSFERS AND AMB.     Balance                                           ADL either performed or assessed with clinical judgement   ADL Overall ADL's : Needs assistance/impaired Eating/Feeding: Independent   Grooming: Wash/dry hands;Wash/dry face;Supervision/safety;Standing   Upper Body Bathing: Supervision/ safety;Set up;Sitting   Lower Body Bathing: Min guard;Sit to/from stand   Upper Body Dressing : Supervision/safety;Set up;Sitting   Lower Body Dressing: Min guard;Sit to/from stand   Toilet Transfer: Min guard;Ambulation;Comfort height toilet;Grab bars   Toileting- Clothing Manipulation and Hygiene: Min guard;Sit to/from stand   Tub/ Shower Transfer:  Min guard;Ambulation   Functional mobility during ADLs: Min guard General ADL Comments: [name] IS ABLE TO PERRFORM ADLS AT S/SETUP FOR UE ADLS AND CGA WITH LE ADLS.      Vision Baseline Vision/History: Wears glasses Wears Glasses: At all times Patient Visual Report: No change from baseline;Other (comment) Additional Comments: [name] HAS A LAZY EYELID AND IT IS CLOSED AT ALL TIMES. HER VISION IS INTACT IN R EYE IF SHE MANUALLY RAISES EYELID     Perception     Praxis      Pertinent Vitals/Pain Pain Assessment: No/denies pain     Hand Dominance Right   Extremity/Trunk Assessment Upper Extremity Assessment Upper Extremity Assessment: Overall WFL for tasks assessed       Cervical / Trunk Assessment Cervical / Trunk Assessment: Kyphotic   Communication Communication Communication: No difficulties   Cognition Arousal/Alertness: Awake/alert Behavior During Therapy: WFL for tasks assessed/performed Overall Cognitive Status: Within Functional Limits for tasks assessed                                     General Comments       Exercises     Shoulder Instructions      Home Living Family/patient expects to be discharged to:: Private residence Living Arrangements: Children;Other (Comment)(patient's daughter and granddaughter) Available Help at Discharge: Family;Available 24 hours/day Type of Home:  House Home Access: Stairs to enter;Ramped entrance Entrance Stairs-Number of Steps: 3   Home Layout: One level     Bathroom Shower/Tub: Occupational psychologist: Handicapped height Bathroom Accessibility: Yes How Accessible: Accessible via walker Home Equipment: [place] - 2 wheels;Cane - single point;Walker - 4 wheels;Shower seat;Grab bars - tub/shower;Hand held shower head          Prior Functioning/Environment Level of Independence: Independent with assistive device(s)        Comments: pt uses SPC at baseline to mobilize in her home and community,  she used to go to store to shop for herself but her daughter has been doing that for her since [place] began        OT Problem List: Decreased activity tolerance      OT Treatment/Interventions: Self-care/ADL training;DME and/or AE instruction;Therapeutic activities;Patient/family education    OT Goals(Current goals can be found in the care plan section) Acute Rehab OT Goals Patient Stated Goal: TO GO HOME OT Goal Formulation: With patient Time For Goal Achievement: 06/30/19 Potential to Achieve Goals: Good  OT Frequency: Min 2X/week   Barriers to D/C:            Co-evaluation              AM-PAC OT "6 Clicks" Daily Activity     Outcome Measure Help from another person eating meals?: None Help from another person taking care of personal grooming?: A Little Help from another person toileting, which includes using toliet, bedpan, or urinal?: A Little Help from another person bathing (including washing, rinsing, drying)?: A Little Help from another person to put on and taking off regular upper body clothing?: A Little Help from another person to put on and taking off regular lower body clothing?: A Little 6 Click Score: 19   End of Session Nurse Communication: Other (comment)(NURSE OK THERAPY)  Activity Tolerance: Patient tolerated treatment well Patient left: in chair;with call bell/phone within reach  OT Visit Diagnosis: Unsteadiness on feet (R26.81);Muscle weakness (generalized) (M62.81)                Time: 1751-0258 OT Time Calculation (min): 38 min Charges:     6 CLICKS  Ulas Zuercher 06/16/2019, 8:38 AM

## 2019-06-16 NOTE — Evaluation (Signed)
Occupational Therapy Evaluation Patient Details Name: Chloe Wilcox MRN: [id number] DOB: [birth year] Today's Date: 06/16/2019    History of Present Illness Patient is 84 y.o. female with medical history significant of hypertension, depression and ex-smoker. She presented to emergency department with complaint of shortness of breath and was admitted for acute hypoxic respiratory failure.   Clinical Impression   PATIENT WAS COOPERATIVE WITH THERAPY AND IS MOTIVATED TO RETURN TO BASELINE. PATIENT IS S TO MIN GUARD ASSIST CURRENTLY AND WAS MOD I AT HOME. PATIENT WOULD BENEFIT FROM CONTINUED OT SERVICES.     Follow Up Recommendations  Home health OT    Equipment Recommendations  None recommended by OT    Recommendations for Other Services       Precautions / Restrictions Precautions Precautions: Fall Restrictions Weight Bearing Restrictions: No      Mobility Bed Mobility                  Transfers Overall transfer level: Needs assistance Equipment used: 1 person hand held assist Transfers: Sit to/from Stand;Stand Pivot Transfers Sit to Stand: Supervision Stand pivot transfers: Min guard       General transfer comment: [name] IS MIN GUARD ASSIST WITH TRANSFERS AND AMB.     Balance                                           ADL either performed or assessed with clinical judgement   ADL Overall ADL's : Needs assistance/impaired Eating/Feeding: Independent   Grooming: Wash/dry hands;Wash/dry face;Supervision/safety;Standing   Upper Body Bathing: Supervision/ safety;Set up;Sitting   Lower Body Bathing: Min guard;Sit to/from stand   Upper Body Dressing : Supervision/safety;Set up;Sitting   Lower Body Dressing: Min guard;Sit to/from stand   Toilet Transfer: Min guard;Ambulation;Comfort height toilet;Grab bars   Toileting- Clothing Manipulation and Hygiene: Min guard;Sit to/from stand   Tub/ Shower Transfer: Min guard;Ambulation   Functional  mobility during ADLs: Min guard General ADL Comments: [name] IS ABLE TO PERRFORM ADLS AT S/SETUP FOR UE ADLS AND CGA WITH LE ADLS.      Vision Baseline Vision/History: Wears glasses Wears Glasses: At all times Patient Visual Report: No change from baseline;Other (comment) Additional Comments: [name] HAS A LAZY EYELID AND IT IS CLOSED AT ALL TIMES. HER VISION IS INTACT IN R EYE IF SHE MANUALLY RAISES EYELID     Perception     Praxis      Pertinent Vitals/Pain Pain Assessment: No/denies pain     Hand Dominance Right   Extremity/Trunk Assessment Upper Extremity Assessment Upper Extremity Assessment: Overall WFL for tasks assessed       Cervical / Trunk Assessment Cervical / Trunk Assessment: Kyphotic   Communication Communication Communication: No difficulties   Cognition Arousal/Alertness: Awake/alert Behavior During Therapy: WFL for tasks assessed/performed Overall Cognitive Status: Within Functional Limits for tasks assessed                                     General Comments       Exercises     Shoulder Instructions      Home Living Family/patient expects to be discharged to:: Private residence Living Arrangements: Children;Other (Comment)(patient's daughter and granddaughter) Available Help at Discharge: Family;Available 24 hours/day Type of Home: House Home Access: Stairs to enter;Ramped entrance Entrance Stairs-Number of Steps:  3   Home Layout: One level     Bathroom Shower/Tub: Occupational psychologist: Handicapped height Bathroom Accessibility: Yes How Accessible: Accessible via walker Home Equipment: [place] - 2 wheels;Cane - single point;Walker - 4 wheels;Shower seat;Grab bars - tub/shower;Hand held shower head          Prior Functioning/Environment Level of Independence: Independent with assistive device(s)        Comments: pt uses SPC at baseline to mobilize in her home and community, she used to go to store to shop for  herself but her daughter has been doing that for her since [place] began        OT Problem List: Decreased activity tolerance      OT Treatment/Interventions: Self-care/ADL training;DME and/or AE instruction;Therapeutic activities;Patient/family education    OT Goals(Current goals can be found in the care plan section) Acute Rehab OT Goals Patient Stated Goal: TO GO HOME OT Goal Formulation: With patient Time For Goal Achievement: 06/30/19 Potential to Achieve Goals: Good  OT Frequency: Min 2X/week   Barriers to D/C:            Co-evaluation              AM-PAC OT "6 Clicks" Daily Activity     Outcome Measure Help from another person eating meals?: None Help from another person taking care of personal grooming?: A Little Help from another person toileting, which includes using toliet, bedpan, or urinal?: A Little Help from another person bathing (including washing, rinsing, drying)?: A Little Help from another person to put on and taking off regular upper body clothing?: A Little Help from another person to put on and taking off regular lower body clothing?: A Little 6 Click Score: 19   End of Session Nurse Communication: Other (comment)(NURSE OK THERAPY)  Activity Tolerance: Patient tolerated treatment well Patient left: in chair;with call bell/phone within reach  OT Visit Diagnosis: Unsteadiness on feet (R26.81);Muscle weakness (generalized) (M62.81)                Time: 6063-0160 OT Time Calculation (min): 38 min Charges:  OT Evaluation $OT Eval Low Complexity: 1 Low OT Treatments $Self Care/Home Management : 1-09 mins  6 CLICKS  Bridgett Hattabaugh 06/16/2019, 11:45 AM

## 2019-06-16 NOTE — Progress Notes (Signed)
SATURATION QUALIFICATIONS: (This note is used to comply with regulatory documentation for home oxygen)  Patient Saturations on Room Air at Rest = 96%  Patient Saturations on Room Air while Ambulating = 87%  Patient Saturations on 2 Liters of oxygen while Ambulating = 96%  Please briefly explain why patient needs home oxygen: Pt saturations drop to 87% on room air while ambulating.

## 2019-06-16 NOTE — TOC Initial Note (Signed)
Transition of Care [facility]) - Initial/Assessment Note    Patient Details  Name: Chloe Wilcox MRN: [id number] Date of Birth: Jul 28, 1935  Transition of Care (TOC) CM/SW Contact:    Joaquin Courts, RN Phone Number: 06/16/2019, 11:38 AM  Clinical Narrative:    CM spoke with patient who prefers CM coordinate d/c planning with daughter [place]. CM spoke with [name] and patient was set up with [facility] OT and RN. [employer] arranged to deliver oxygen for d/c home.                 Expected Discharge Plan: [place] Barriers to Discharge: No Barriers Identified   Patient Goals and CMS Choice Patient states their goals for this hospitalization and ongoing recovery are:: to go home CMS Medicare.gov Compare Post Acute Care list provided to:: Patient Choice offered to / list presented to : Patient  Expected Discharge Plan and Services Expected Discharge Plan: [place]   Discharge Planning Services: CM Consult Post Acute Care Choice: [place] arrangements for the past 2 months: Single Family Home Expected Discharge Date: 06/16/19               DME Arranged: Oxygen DME Agency: [employer] Date DME Agency Contacted: 06/16/19 Time DME Agency Contacted: [id number] Representative spoke with at DME Agency: on call answering services rep HH Arranged: RN, PT, OT [place] Agency: [facility] (formerly [employer]) Date [place]: 06/16/19 Time [place]: 1137 Representative spoke with at [place]: Alwyn Ren  Prior Living Arrangements/Services Living arrangements for the past 2 months: [place] Lives with:: Adult Children Patient language and need for interpreter reviewed:: Yes Do you feel safe going back to the place where you live?: Yes      Need for Family Participation in Patient Care: Yes (Comment) Care giver support system in place?: Yes (comment)   Criminal Activity/Legal Involvement Pertinent to  Current Situation/Hospitalization: No - Comment as needed  Activities of Daily Living Home Assistive Devices/Equipment: Cane (specify quad or straight), Hearing aid(straight) ADL Screening (condition at time of admission) Patient's cognitive ability adequate to safely complete daily activities?: Yes Is the patient deaf or have difficulty hearing?: Yes Does the patient have difficulty seeing, even when wearing glasses/contacts?: Yes Does the patient have difficulty concentrating, remembering, or making decisions?: Yes Patient able to express need for assistance with ADLs?: Yes Does the patient have difficulty dressing or bathing?: Yes Independently performs ADLs?: No Communication: Independent Dressing (OT): Needs assistance Is this a change from baseline?: Change from baseline, expected to last <3days Grooming: Needs assistance Is this a change from baseline?: Change from baseline, expected to last <3 days Feeding: Independent Bathing: Needs assistance Is this a change from baseline?: Change from baseline, expected to last <3 days Toileting: Needs assistance Is this a change from baseline?: Change from baseline, expected to last <3 days In/Out Bed: Needs assistance Is this a change from baseline?: Change from baseline, expected to last <3 days Walks in Home: Independent with device (comment) Does the patient have difficulty walking or climbing stairs?: Yes Weakness of Legs: Right Weakness of Arms/Hands: None  Permission Sought/Granted   Permission granted to share information with : Yes, Verbal Permission Granted  Share Information with NAME: [place] granted to share info w Relationship: Daughter     Emotional Assessment Appearance:: Appears stated age Attitude/Demeanor/Rapport: Engaged Affect (typically observed): Accepting Orientation: : Oriented  to Self, Oriented to Place, Oriented to  Time, Oriented to Situation   Psych Involvement: No  (comment)  Admission diagnosis:  SOB (shortness of breath) [R06.02] Hypoxia [R09.02] Patient Active Problem List   Diagnosis Date Noted  . Respiratory failure ([place]) 06/15/2019  . Acute respiratory failure with hypoxia ([place]) 06/14/2019  . COPD exacerbation ([place]) 06/14/2019  . COPD with acute bronchitis ([place]) 06/14/2019  . Hypokalemia 06/14/2019  . Essential hypertension 06/14/2019   PCP:  [name], MD Pharmacy:   [id number] - [place], [address] [place] [address] Phone: [phone] Fax: [phone]     Social Determinants of Health (SDOH) Interventions    Readmission Risk Interventions No flowsheet data found.

## 2019-06-16 NOTE — Plan of Care (Signed)
PATIENT TO BE SEEN BY ACUTE OT TO MAXIMIZE I AND SAFETY.

## 2019-06-16 NOTE — Discharge Summary (Signed)
Physician Discharge Summary  Chloe Wilcox JYN:[id number] DOB: Nov 01, 1935 DOA: 06/14/2019  PCP: [name], MD  Admit date: 06/14/2019 Discharge date: 06/16/2019  Admitted From: Home  Discharge disposition: Home with home health   Recommendations for Outpatient Follow-Up:   Follow up with your primary care provider in one week.    Discharge Diagnosis:   Active Problems:   Acute respiratory failure with hypoxia (HCC)   COPD exacerbation (HCC)   COPD with acute bronchitis (HCC)   Hypokalemia   Essential hypertension   Respiratory failure ([place])  Discharge Condition: Improved.  Diet recommendation: Low sodium, heart healthy.   Wound care: None.  Code status: Full.   History of Present Illness:   Chloe Wilcox a 84 y.o.femalewith medical history significant ofhypertension, depression and ex-smoker presented to emergency department with complaint of shortness of breath. According to patient, she started feeling shortness of breath along with some cough for a week. Cough was dry in the beginning but has been productive of white sputum for  2 to 3 days. Upon initial presentation to the ED, she was hemodynamically stable and was saturating 91% on room air but she was walked after that and dropped her oxygen saturation to 85% on room air requiring 2 L of oxygen. Further work-up was done which included CBC which was unremarkable and CMP which shows hypokalemia.Chest x-ray was unremarkable. COVID-19 was negative. According to ED physician report, she was wheezy on examination so she was given Solu-Medrol. Hospitalist service was requested to admit the patient for acute hypoxic respiratory failure.   Hospital Course:  Following conditions were addressed during hospitalization,  Acute hypoxic respiratory failure, present on admission.  Patient has remained hypoxic and has qualified for home oxygen.  Patient does have a undiagnosed COPD with exacerbation this  time.  She will be given albuterol and Spiriva and steroid inhaler on discharge including prednisone taper and empiric antibiotic on discharge.  She was advised to follow-up with her primary care physician in get a referral for pulmonary to assess pulmonary function test.  Patient does have history of smoking in the past.  Chest x-ray was negative. Procalcitonin was negative.  Respiratory viral panel was negative as well.COVID-19 negative.  d-dimer was obtained which was negative.  BNP is mildly elevated.  2D echocardiogram showed preserved LV function.  Essential hypertension: On lisinopril.  Will need to follow-up with primary care physician as outpatient for better blood pressure control at this point.  Hypokalemia on presentation.  Improved after replacement.   Disposition.  At this time, patient is stable for disposition home with home health OT PT and RN.  Patient was advised to follow-up with her primary care physician as outpatient.  I have spoken with the patient's daughter on the phone and updated her about the clinical condition of the patient and the plan for disposition.    Medical Consultants:    None.   Subjective:   Today, patient feels better with breathing.  Still requiring oxygen and was hypoxic on ambulation, improved with nasal cannula 2 L.  Denies any chest pain, fever, chills or rigor.  Discharge Exam:   Vitals:   06/16/19 1005 06/16/19 1006  BP:    Pulse:    Resp:    Temp:    SpO2: (!) 87% 96%   Vitals:   06/16/19 0415 06/16/19 0950 06/16/19 1005 06/16/19 1006  BP:  (!) 162/72    Pulse:  67    Resp:  Temp:      TempSrc:      SpO2:  99% (!) 87% 96%  Weight: 59 kg       General exam: Appears calm and comfortable ,Not in distress, on nasal cannula. HEENT:PERRL,Oral mucosa moist Respiratory system: Diminished breath sounds bilaterally.   Cardiovascular system: S1 & S2 heard, RRR.  Gastrointestinal system: Abdomen is nondistended, soft and  nontender. No organomegaly or masses felt. Normal bowel sounds heard. Central nervous system: Alert and oriented. No focal neurological deficits. Extremities: No edema, no clubbing ,no cyanosis, distal peripheral pulses palpable. Skin: No rashes, lesions or ulcers,no icterus ,no pallor MSK: Normal muscle bulk,tone ,power    Procedures:    None  The results of significant diagnostics from this hospitalization (including imaging, microbiology, ancillary and laboratory) are listed below for reference.     Diagnostic Studies:   Dg Chest Port 1 View  Result Date: 06/14/2019 CLINICAL DATA:  Shortness of breath for 2 weeks EXAM: PORTABLE CHEST 1 VIEW COMPARISON:  06/06/2018 FINDINGS: Cardiac shadow is stable. Aortic calcifications are seen. The lungs are well aerated bilaterally. No focal infiltrate or sizable effusion is seen. No bony abnormality is noted. IMPRESSION: No acute abnormality noted. Electronically Signed   By: Inez Catalina M.D.   On: 06/14/2019 10:33     Labs:   Basic Metabolic Panel: Recent Labs  Lab 06/14/19 0946 06/14/19 1535 06/15/19 0638 06/16/19 0540  NA 138  --  140 140  K 3.2*  --  3.9 3.8  CL 92*  --  97* 100  CO2 34*  --  34* 33*  GLUCOSE 102*  --  96 90  BUN 8  --  10 10  CREATININE 0.53 0.53 0.58 0.56  CALCIUM 9.9  --  9.7 9.5  MG  --   --   --  2.0   GFR CrCl cannot be calculated (Unknown ideal weight.). Liver Function Tests: Recent Labs  Lab 06/14/19 0946 06/15/19 0638  AST 23 19  ALT 19 18  ALKPHOS 51 49  BILITOT 0.9 0.9  PROT 6.7 6.3*  ALBUMIN 4.3 3.9   No results for input(s): LIPASE, AMYLASE in the last 168 hours. No results for input(s): AMMONIA in the last 168 hours. Coagulation profile No results for input(s): INR, PROTIME in the last 168 hours.  CBC: Recent Labs  Lab 06/14/19 0946 06/14/19 1535 06/15/19 0638 06/16/19 0540  WBC 8.2 6.2 5.6 7.6  NEUTROABS 4.0  --  3.9  --   HGB 12.0 13.2 12.2 11.9*  HCT 39.5 43.3 40.1  40.7  MCV 93.2 93.1 93.3 94.0  PLT 298 327 292 328   Cardiac Enzymes: No results for input(s): CKTOTAL, CKMB, CKMBINDEX, TROPONINI in the last 168 hours. BNP: Invalid input(s): POCBNP CBG: No results for input(s): GLUCAP in the last 168 hours. D-Dimer Recent Labs    06/15/19 1221  DDIMER 0.50   Hgb A1c No results for input(s): HGBA1C in the last 72 hours. Lipid Profile No results for input(s): CHOL, HDL, LDLCALC, TRIG, CHOLHDL, LDLDIRECT in the last 72 hours. Thyroid function studies No results for input(s): TSH, T4TOTAL, T3FREE, THYROIDAB in the last 72 hours.  Invalid input(s): FREET3 Anemia work up No results for input(s): VITAMINB12, FOLATE, FERRITIN, TIBC, IRON, RETICCTPCT in the last 72 hours. Microbiology Recent Results (from the past 240 hour(s))  SARS Coronavirus 2 (CEPHEID- Performed in [place] hospital lab), Hosp Order     Status: None   Collection Time: 06/14/19 10:19 AM  Specimen: Nasopharyngeal Swab  Result Value Ref Range Status   SARS Coronavirus 2 NEGATIVE NEGATIVE Final    Comment: (NOTE) If result is NEGATIVE SARS-CoV-2 target nucleic acids are NOT DETECTED. The SARS-CoV-2 RNA is generally detectable in upper and lower  respiratory specimens during the acute phase of infection. The lowest  concentration of SARS-CoV-2 viral copies this assay can detect is 250  copies / mL. A negative result does not preclude SARS-CoV-2 infection  and should not be used as the sole basis for treatment or other  patient management decisions.  A negative result may occur with  improper specimen collection / handling, submission of specimen other  than nasopharyngeal swab, presence of viral mutation(s) within the  areas targeted by this assay, and inadequate number of viral copies  (<250 copies / mL). A negative result must be combined with clinical  observations, patient history, and epidemiological information. If result is POSITIVE SARS-CoV-2 target nucleic acids  are DETECTED. The SARS-CoV-2 RNA is generally detectable in upper and lower  respiratory specimens dur ing the acute phase of infection.  Positive  results are indicative of active infection with SARS-CoV-2.  Clinical  correlation with patient history and other diagnostic information is  necessary to determine patient infection status.  Positive results do  not rule out bacterial infection or co-infection with other viruses. If result is PRESUMPTIVE POSTIVE SARS-CoV-2 nucleic acids MAY BE PRESENT.   A presumptive positive result was obtained on the submitted specimen  and confirmed on repeat testing.  While 2019 novel coronavirus  (SARS-CoV-2) nucleic acids may be present in the submitted sample  additional confirmatory testing may be necessary for epidemiological  and / or clinical management purposes  to differentiate between  SARS-CoV-2 and other Sarbecovirus currently known to infect humans.  If clinically indicated additional testing with an alternate test  methodology [phone]) is advised. The SARS-CoV-2 RNA is generally  detectable in upper and lower respiratory sp ecimens during the acute  phase of infection. The expected result is Negative. Fact Sheet for Patients:  StrictlyIdeas.no Fact Sheet for Healthcare Providers: BankingDealers.co.za This test is not yet approved or cleared by the [place] FDA and has been authorized for detection and/or diagnosis of SARS-CoV-2 by FDA under an Emergency Use Authorization (EUA).  This EUA will remain in effect (meaning this test can be used) for the duration of the COVID-19 declaration under Section 564(b)(1) of the Act, 21 U.S.C. section 360bbb-3(b)(1), unless the authorization is terminated or revoked sooner. Performed at [facility], [place] [address], [place], [place] [address]   Respiratory Panel by PCR     Status: None   Collection Time: 06/14/19  3:04 PM    Specimen: Nasopharyngeal Swab; Respiratory  Result Value Ref Range Status   Adenovirus NOT DETECTED NOT DETECTED Final   Coronavirus 229E NOT DETECTED NOT DETECTED Final    Comment: (NOTE) The Coronavirus on the Respiratory Panel, DOES NOT test for the novel  Coronavirus (2019 nCoV)    Coronavirus HKU1 NOT DETECTED NOT DETECTED Final   Coronavirus NL63 NOT DETECTED NOT DETECTED Final   Coronavirus OC43 NOT DETECTED NOT DETECTED Final   Metapneumovirus NOT DETECTED NOT DETECTED Final   Rhinovirus / Enterovirus NOT DETECTED NOT DETECTED Final   Influenza A NOT DETECTED NOT DETECTED Final   Influenza B NOT DETECTED NOT DETECTED Final   Parainfluenza Virus 1 NOT DETECTED NOT DETECTED Final   Parainfluenza Virus 2 NOT DETECTED NOT DETECTED Final   Parainfluenza Virus  3 NOT DETECTED NOT DETECTED Final   Parainfluenza Virus 4 NOT DETECTED NOT DETECTED Final   Respiratory Syncytial Virus NOT DETECTED NOT DETECTED Final   Bordetella pertussis NOT DETECTED NOT DETECTED Final   Chlamydophila pneumoniae NOT DETECTED NOT DETECTED Final   Mycoplasma pneumoniae NOT DETECTED NOT DETECTED Final    Comment: Performed at [facility], [place] [address], [place], [place] [address]     Discharge Instructions:   Discharge Instructions    Diet - low sodium heart healthy   Complete by: As directed    Discharge instructions   Complete by: As directed    Please and take inhalers as prescribed.  Continue course of antibiotic and steroids.  Continue oxygen as prescribed at 2 L/min.  Please follow-up with your primary care physician within 1 week and discuss about getting a pulmonary follow-up for getting a pulmonary function test and fine-tuning of medications.  Seek medical attention for worsening symptoms.   Increase activity slowly   Complete by: As directed      Allergies as of 06/16/2019   No Known Allergies     Medication List    STOP taking these medications   amoxicillin 500 MG tablet  Commonly known as: AMOXIL     TAKE these medications   albuterol 108 (90 Base) MCG/ACT inhaler Commonly known as: VENTOLIN HFA Inhale 2 puffs into the lungs every 6 (six) hours as needed for wheezing or shortness of breath.   aspirin 325 MG tablet Take 325 mg by mouth daily.   atorvastatin 40 MG tablet Commonly known as: LIPITOR Take 40 mg by mouth at bedtime.   benzonatate 100 MG capsule Commonly known as: TESSALON Take 1 capsule (100 mg total) by mouth every 8 (eight) hours. What changed:   when to take this  reasons to take this   citalopram 20 MG tablet Commonly known as: CELEXA Take 20 mg by mouth daily.   diclofenac 75 MG EC tablet Commonly known as: VOLTAREN Take 75 mg by mouth 2 (two) times daily.   doxycycline 100 MG tablet Commonly known as: VIBRA-TABS Take 1 tablet (100 mg total) by mouth 2 (two) times daily for 3 days.   fluticasone 110 MCG/ACT inhaler Commonly known as: FLOVENT HFA Inhale 2 puffs into the lungs 2 (two) times daily.   hydrochlorothiazide 25 MG tablet Commonly known as: HYDRODIURIL Take 25 mg by mouth every other day.   INTEGRA PO Take 1 capsule by mouth See admin instructions. Take 1 capsule by mouth on an empty stomach, an hour before first meal of the day   multivitamin tablet Take 1 tablet by mouth daily.   olmesartan 20 MG tablet Commonly known as: BENICAR Take 20 mg by mouth daily.   oxyCODONE-acetaminophen 5-325 MG tablet Commonly known as: PERCOCET/ROXICET Take 1 tablet by mouth 2 (two) times daily as needed for moderate pain.   predniSONE 20 MG tablet Commonly known as: DELTASONE Take 2 tablets (40 mg total) by mouth daily with breakfast for 3 days. Start taking on: June 17, 2019   Spiriva HandiHaler 18 MCG inhalation capsule Generic drug: tiotropium Place 1 capsule (18 mcg total) into inhaler and inhale daily.   traMADol 50 MG tablet Commonly known as: ULTRAM Take 50 mg by mouth daily as needed for moderate  pain.            Durable Medical Equipment  (From admission, onward)         Start  Ordered   06/16/19 1025  DME Oxygen  Once    Comments: COPD with hypoxic respiratory failure.  Question Answer Comment  Length of Need Lifetime   Mode or (Route) Nasal cannula   Liters per Minute 2   Frequency Continuous (stationary and portable oxygen unit needed)   Oxygen conserving device Yes   Oxygen delivery system Gas      06/16/19 1026            Time coordinating discharge: 39 minutes  Signed:  Kavon Valenza  [facility] 06/16/2019, 10:26 AM

## 2019-06-16 NOTE — Progress Notes (Signed)

## 2019-07-25 ENCOUNTER — Telehealth: Payer: Self-pay | Admitting: Pulmonary Disease

## 2019-07-25 NOTE — Telephone Encounter (Signed)
Call made to patient, confirmed consult appt for 08/07/2019. Voiced understanding. Nothing further needed at this time.

## 2019-08-07 ENCOUNTER — Encounter: Payer: Self-pay | Admitting: Pulmonary Disease

## 2019-08-07 ENCOUNTER — Telehealth: Payer: Self-pay

## 2019-08-07 ENCOUNTER — Other Ambulatory Visit: Payer: Self-pay

## 2019-08-07 ENCOUNTER — Ambulatory Visit (INDEPENDENT_AMBULATORY_CARE_PROVIDER_SITE_OTHER): Payer: Medicare Other | Admitting: Pulmonary Disease

## 2019-08-07 VITALS — BP 122/68 | HR 72 | Temp 98.1°F | Wt 116.0 lb

## 2019-08-07 DIAGNOSIS — Z9981 Dependence on supplemental oxygen: Secondary | ICD-10-CM

## 2019-08-07 DIAGNOSIS — R1900 Intra-abdominal and pelvic swelling, mass and lump, unspecified site: Secondary | ICD-10-CM

## 2019-08-07 DIAGNOSIS — R0602 Shortness of breath: Secondary | ICD-10-CM

## 2019-08-07 DIAGNOSIS — Z87891 Personal history of nicotine dependence: Secondary | ICD-10-CM

## 2019-08-07 DIAGNOSIS — R0902 Hypoxemia: Secondary | ICD-10-CM

## 2019-08-07 DIAGNOSIS — R0600 Dyspnea, unspecified: Secondary | ICD-10-CM

## 2019-08-07 DIAGNOSIS — R0609 Other forms of dyspnea: Secondary | ICD-10-CM

## 2019-08-07 MED ORDER — ANORO ELLIPTA 62.5-25 MCG/INH IN AEPB: 1.0000 | INHALATION_SPRAY | Freq: Every day | RESPIRATORY_TRACT | 0 refills | Status: DC

## 2019-08-07 NOTE — Progress Notes (Signed)
Synopsis: Referred in September 2020 for hypoxemia by [name], MD  Subjective:   PATIENT ID: Chloe Wilcox GENDER: female DOB: 1935/09/01, MRN: [id number]  Chief Complaint  Patient presents with  . Consult    Consult SOB. She was recently seen in ED for SOB and was sent home on oxygen. She reports she was SOB for about 2 months prior to ED visit. She reports the SOB has significantly improved.     This is a 84 year old female past medical history of depression and hypertension.  Recently hospitalized in July 2020.  She is a former smoker.  She had increased shortness of breath and cough with sputum production.  Diagnosed with possible COPD exacerbation.  She was found to be hypoxemic 85% on room air requiring 2 L nasal cannula.  She was treated with IV steroids and bronchodilators.  Patient slowly improved was discharged on June 16, 2019 home with home health.  Patient was still requiring oxygen and recommended outpatient pulmonary follow-up.  COVID testing on admission negative.  Patient had chest x-ray on admission with no infiltrate.  Echocardiogram with preserved ejection fraction, grade 1 diastolic dysfunction, moderate pulmonary hypertension RVSP 55.  OV 08/07/2019: Patient doing well today.  Has been using her oxygen at home as directed.  Wants to know if she can come off of this.  Recently diagnosed with enlarging 9.5 cm pelvic mass.  Seen by urogynecology at [facility].  They are making plans for surgical removal of this.  Looking for pulmonary risk stratification peri-op eval.  She is a former smoker.  Does have daily cough sputum production.  She is doing much better since her hospitalization.  Denies fevers chills night sweats weight loss.  Denies hemoptysis.  She has been using Spiriva HandiHaler plus Flovent.  Occasionally needing to use her albuterol.  She gets winded with short distances.  Has dyspnea on exertion.   Past Medical History:  Diagnosis Date  . Depression    . Hypertension      Family History  Family history unknown: Yes     Past Surgical History:  Procedure Laterality Date  . VAGINAL HYSTERECTOMY      Social History   Socioeconomic History  . Marital status: Married    Spouse name: Not on file  . Number of children: Not on file  . Years of education: Not on file  . Highest education level: Not on file  Occupational History  . Not on file  Social Needs  . Financial resource strain: Not on file  . Food insecurity    Worry: Not on file    Inability: Not on file  . Transportation needs    Medical: Not on file    Non-medical: Not on file  Tobacco Use  . Smoking status: Former Research scientist (life sciences)  . Smokeless tobacco: Never Used  . Tobacco comment: quit smoking 30 years ago  Substance and Sexual Activity  . Alcohol use: No  . Drug use: No  . Sexual activity: Not on file  Lifestyle  . Physical activity    Days per week: Not on file    Minutes per session: Not on file  . Stress: Not on file  Relationships  . Social Herbalist on phone: Not on file    Gets together: Not on file    Attends religious service: Not on file    Active member of club or organization: Not on file    Attends meetings of clubs  or organizations: Not on file    Relationship status: Not on file  . Intimate partner violence    Fear of current or ex partner: Not on file    Emotionally abused: Not on file    Physically abused: Not on file    Forced sexual activity: Not on file  Other Topics Concern  . Not on file  Social History Narrative  . Not on file     No Known Allergies   Outpatient Medications Prior to Visit  Medication Sig Dispense Refill  . albuterol (VENTOLIN HFA) 108 (90 Base) MCG/ACT inhaler Inhale 2 puffs into the lungs every 6 (six) hours as needed for wheezing or shortness of breath. 18 g 2  . amLODipine (NORVASC) 10 MG tablet TAKE 1 TABLET BY MOUTH EVERY DAY    . aspirin 325 MG tablet Take 325 mg by mouth daily.    Marland Kitchen  atorvastatin (LIPITOR) 40 MG tablet Take 40 mg by mouth at bedtime.    . benzonatate (TESSALON) 100 MG capsule Take 1 capsule (100 mg total) by mouth every 8 (eight) hours. (Patient taking differently: Take 100 mg by mouth 3 (three) times daily as needed for cough. ) 21 capsule 0  . citalopram (CELEXA) 20 MG tablet Take 20 mg by mouth daily.    . diclofenac (VOLTAREN) 75 MG EC tablet Take 75 mg by mouth 2 (two) times daily.    . Fe Fum-FePoly-Vit C-Vit B3 (INTEGRA PO) Take 1 capsule by mouth See admin instructions. Take 1 capsule by mouth on an empty stomach, an hour before first meal of the day    . fluticasone (FLOVENT HFA) 110 MCG/ACT inhaler Inhale 2 puffs into the lungs 2 (two) times daily. 1 Inhaler 2  . hydrochlorothiazide (HYDRODIURIL) 25 MG tablet Take 25 mg by mouth every other day.    . Multiple Vitamin (MULTIVITAMIN) tablet Take 1 tablet by mouth daily.    Marland Kitchen olmesartan (BENICAR) 20 MG tablet Take 20 mg by mouth daily.    Marland Kitchen oxyCODONE-acetaminophen (PERCOCET/ROXICET) 5-325 MG tablet Take 1 tablet by mouth 2 (two) times daily as needed for moderate pain.   0  . tiotropium (SPIRIVA HANDIHALER) 18 MCG inhalation capsule Place 1 capsule (18 mcg total) into inhaler and inhale daily. 30 capsule 2  . traMADol (ULTRAM) 50 MG tablet Take 50 mg by mouth daily as needed for moderate pain.     No facility-administered medications prior to visit.     Review of Systems  Constitutional: Positive for malaise/fatigue. Negative for chills, fever and weight loss.  HENT: Negative for hearing loss, sore throat and tinnitus.   Eyes: Negative for blurred vision and double vision.  Respiratory: Positive for cough and shortness of breath. Negative for hemoptysis, sputum production, wheezing and stridor.   Cardiovascular: Negative for chest pain, palpitations, orthopnea, leg swelling and PND.  Gastrointestinal: Negative for abdominal pain, constipation, diarrhea, heartburn, nausea and vomiting.   Genitourinary: Negative for dysuria, hematuria and urgency.  Musculoskeletal: Negative for joint pain and myalgias.  Skin: Negative for itching and rash.  Neurological: Negative for dizziness, tingling, weakness and headaches.  Endo/Heme/Allergies: Negative for environmental allergies. Does not bruise/bleed easily.  Psychiatric/Behavioral: Negative for depression. The patient is not nervous/anxious and does not have insomnia.   All other systems reviewed and are negative.    Objective:  Physical Exam Vitals signs reviewed.  Constitutional:      General: She is not in acute distress.    Appearance: She is well-developed.  HENT:     Head: Normocephalic and atraumatic.     Mouth/Throat:     Pharynx: No oropharyngeal exudate.  Eyes:     Conjunctiva/sclera: Conjunctivae normal.     Pupils: Pupils are equal, round, and reactive to light.  Neck:     Vascular: No JVD.     Trachea: No tracheal deviation.     Comments: Loss of supraclavicular fat Cardiovascular:     Rate and Rhythm: Normal rate and regular rhythm.     Heart sounds: S1 normal and S2 normal.     Comments: Distant heart tones Pulmonary:     Effort: No tachypnea or accessory muscle usage.     Breath sounds: No stridor. Decreased breath sounds (throughout all lung fields) present. No wheezing, rhonchi or rales.  Abdominal:     General: Bowel sounds are normal. There is distension.     Palpations: Abdomen is soft. There is mass.     Tenderness: There is no abdominal tenderness.     Comments: Firm palpation within the lower abdomen.  Musculoskeletal:        General: Deformity (muscle wasting ) present.  Skin:    General: Skin is warm and dry.     Capillary Refill: Capillary refill takes less than 2 seconds.     Findings: No rash.  Neurological:     Mental Status: She is alert and oriented to person, place, and time.  Psychiatric:        Behavior: Behavior normal.      Vitals:   08/07/19 1610 08/07/19 1611  BP:   122/68  Pulse:  72  Temp:  98.1 F (36.7 C)  TempSrc:  Temporal  SpO2:  100%  Weight: 116 lb (52.6 kg) 116 lb (52.6 kg)   100% on 2 L nasal cannula BMI Readings from Last 3 Encounters:  08/07/19 21.92 kg/m  06/16/19 24.58 kg/m  01/07/17 27.12 kg/m   Wt Readings from Last 3 Encounters:  08/07/19 116 lb (52.6 kg)  06/16/19 130 lb 1.1 oz (59 kg)  01/07/17 163 lb (73.9 kg)     CBC    Component Value Date/Time   WBC 7.6 06/16/2019 0540   RBC 4.33 06/16/2019 0540   HGB 11.9 (L) 06/16/2019 0540   HCT 40.7 06/16/2019 0540   PLT 328 06/16/2019 0540   MCV 94.0 06/16/2019 0540   MCH 27.5 06/16/2019 0540   MCHC 29.2 (L) 06/16/2019 0540   RDW 15.0 06/16/2019 0540   LYMPHSABS 0.8 06/15/2019 0638   MONOABS 0.6 06/15/2019 0638   EOSABS 0.2 06/15/2019 0638   BASOSABS 0.0 06/15/2019 0638    Chest Imaging: Chest x-ray 06/14/2019: No infiltrate. The patient's images have been independently reviewed by me.    Pulmonary Functions Testing Results: No flowsheet data found.  FeNO: None   Pathology: None   Echocardiogram:  July 2020: Preserved ejection fraction 55 to 123456, grade 1 diastolic dysfunction, moderate pulmonary hypertension RVSP 55.  Heart Catheterization: None     Assessment & Plan:     ICD-10-CM   1. Hypoxemia  R09.02 Pulmonary Function Test  2. Former smoker  Z87.891 Pulmonary Function Test  3. Oxygen dependent  Z99.81   4. SOB (shortness of breath)  R06.02   5. DOE (dyspnea on exertion)  R06.09   6. Pelvic mass  R19.00     Discussion:  84 year old female.  Former smoker quit back in the late 1980s.  Diagnosed with possible COPD.  No formal PFTs.  Recent hospitalization  for what appears to be diastolic heart failure and COPD exacerbation.  Doing well since her hospitalization. 9.5 cm pelvic mass in need of perioperative stratification for surgical removal.  Plan: We will walk her today to see if she needs continued use of oxygen. We will have a ONO off  oxygen. We will have her stop the use of Spiriva HandiHaler and Flovent Start Anoro Ellipta.  This is once daily. Can continue use of albuterol as needed. She will need full PFTs to determine severity of likely underlying obstructive disease. We will get her PFTs scheduled as soon as possible we will have her follow-up with nurse practitioner in clinic to review these to discuss preop risk stratification for pelvic surgery.  We will also be able to see if her new inhaler regimen is working.  Greater than 50% of this patient's 45-minute office visit was spent face-to-face discussing above recommendations and treatment plan.  We also reviewed her past medical records and recent hospitalization.  Additionally notes from [facility] for planned surgery.   Current Outpatient Medications:  .  albuterol (VENTOLIN HFA) 108 (90 Base) MCG/ACT inhaler, Inhale 2 puffs into the lungs every 6 (six) hours as needed for wheezing or shortness of breath., Disp: 18 g, Rfl: 2 .  amLODipine (NORVASC) 10 MG tablet, TAKE 1 TABLET BY MOUTH EVERY DAY, Disp: , Rfl:  .  aspirin 325 MG tablet, Take 325 mg by mouth daily., Disp: , Rfl:  .  atorvastatin (LIPITOR) 40 MG tablet, Take 40 mg by mouth at bedtime., Disp: , Rfl:  .  benzonatate (TESSALON) 100 MG capsule, Take 1 capsule (100 mg total) by mouth every 8 (eight) hours. (Patient taking differently: Take 100 mg by mouth 3 (three) times daily as needed for cough. ), Disp: 21 capsule, Rfl: 0 .  citalopram (CELEXA) 20 MG tablet, Take 20 mg by mouth daily., Disp: , Rfl:  .  diclofenac (VOLTAREN) 75 MG EC tablet, Take 75 mg by mouth 2 (two) times daily., Disp: , Rfl:  .  Fe Fum-FePoly-Vit C-Vit B3 (INTEGRA PO), Take 1 capsule by mouth See admin instructions. Take 1 capsule by mouth on an empty stomach, an hour before first meal of the day, Disp: , Rfl:  .  fluticasone (FLOVENT HFA) 110 MCG/ACT inhaler, Inhale 2 puffs into the lungs 2 (two) times daily., Disp: 1 Inhaler, Rfl:  2 .  hydrochlorothiazide (HYDRODIURIL) 25 MG tablet, Take 25 mg by mouth every other day., Disp: , Rfl:  .  Multiple Vitamin (MULTIVITAMIN) tablet, Take 1 tablet by mouth daily., Disp: , Rfl:  .  olmesartan (BENICAR) 20 MG tablet, Take 20 mg by mouth daily., Disp: , Rfl:  .  oxyCODONE-acetaminophen (PERCOCET/ROXICET) 5-325 MG tablet, Take 1 tablet by mouth 2 (two) times daily as needed for moderate pain. , Disp: , Rfl: 0 .  tiotropium (SPIRIVA HANDIHALER) 18 MCG inhalation capsule, Place 1 capsule (18 mcg total) into inhaler and inhale daily., Disp: 30 capsule, Rfl: 2 .  traMADol (ULTRAM) 50 MG tablet, Take 50 mg by mouth daily as needed for moderate pain., Disp: , Rfl:    Garner Nash, DO  Pulmonary Critical Care 08/07/2019 4:25 PM

## 2019-08-07 NOTE — Patient Instructions (Addendum)
Thank you for visiting Dr. Valeta Harms at [facility]. Today we recommend the following: Orders Placed This Encounter  Procedures  . Pulmonary Function Test   Start Anoro inhaler once daily  Stop Spiriva and Flovent for the time being  Continue albuterol as needed   Return in about 2 weeks (around 08/21/2019) for with APP. Follow up appt with Bertrum Sol or Sarah to review PFTs and use of new inhaler regimen (Anoro). PFTS need to be scheduled prior to this appt.     Please do your part to reduce the spread of COVID-19.

## 2019-08-07 NOTE — Telephone Encounter (Signed)
Dr. Valeta Harms would like PFT'S IN 2 weeks for this patient with OV. Thanks.

## 2019-08-09 ENCOUNTER — Telehealth: Payer: Self-pay | Admitting: Pulmonary Disease

## 2019-08-09 NOTE — Telephone Encounter (Signed)
Called and spoke with Patient's Daughter, [name].  [name] stated Patient told her that she needed to come pick up machine for sleep study today. [name] stated Patient said it wasn't ready yesterday.  Per Dr Juline Patch AVS and orders placed.  Patient is ordered ONO.  Explained [facility] does not complete ONO test here or have machine needed. Explained Patient's DME company will contact them to pick up ONO device and show them how to use it.  Understanding stated.  Nothing further at this time.

## 2019-08-09 NOTE — Telephone Encounter (Signed)
pft scheduled on 08/22/2019-pr

## 2019-08-14 ENCOUNTER — Other Ambulatory Visit: Payer: Self-pay | Admitting: Pulmonary Disease

## 2019-08-14 ENCOUNTER — Telehealth: Payer: Self-pay | Admitting: Pulmonary Disease

## 2019-08-14 NOTE — Telephone Encounter (Signed)
Called and spoke w/ pt daughter, Chloe Wilcox, with BI's response below. Chloe Wilcox verbalized understanding and states she found out pt's Anoro prescription would be $40 copay for two week supply, which amounts to $80 monthly. I let her know we could still provide pt with Anoro samples; however, we would need to get to the bottom of how pt can better afford this inhaler. Chloe Wilcox expressed understanding.   While over the phone, I have also scheduled pt's pre-procedural COVID test for 08/19/2019 at 12:00 PM EDT. Pt's PFT and follow up OV is scheduled for 08/22/2019.   Sample closet is now locked as it is after 5:30 PM EDT. I am going to hold this message in Triage to ensure pt will be able to receive Anoro samples as I will not be in the office tomorrow.

## 2019-08-14 NOTE — Telephone Encounter (Signed)
Called and spoke with pt daughter, [name] (emergency contact, not on DPR). [name] states pt is about to run out of her Anoro sample and will need more. I inquired if pt will need a prescription and [name] stated she has to call the pharmacy to see if she will be able to afford the inhaler. [name] further notes the last time she spoke with Dr. Valeta Harms he suggested she call the pharmacy to determine coverage and then give our office a call back for samples.   I let her know I would get a message to Dr. Valeta Harms for the sample request and suggested she call pt's pharmacy for this information. [name] expressed understanding.   BI, please advise if you would be ok with providing pt with more Anoro samples. Thank you.

## 2019-08-14 NOTE — Telephone Encounter (Signed)
PCCM:  Yes, I am happy to supply samples if there is an issue with cost.  Please send rx to pharmacy if needed as well.   Garner Nash, DO [place] [facility] 08/14/2019 5:16 PM

## 2019-08-15 NOTE — Telephone Encounter (Signed)
FYI there are no Anoro samples at this time.

## 2019-08-19 ENCOUNTER — Other Ambulatory Visit (HOSPITAL_COMMUNITY)
Admission: RE | Admit: 2019-08-19 | Discharge: 2019-08-19 | Disposition: A | Discharge status: Home / Self Care | Payer: Medicare Other | Source: Ambulatory Visit | Attending: Pulmonary Disease | Admitting: Pulmonary Disease

## 2019-08-19 DIAGNOSIS — Z01812 Encounter for preprocedural laboratory examination: Secondary | ICD-10-CM | POA: Insufficient documentation

## 2019-08-19 DIAGNOSIS — Z20828 Contact with and (suspected) exposure to other viral communicable diseases: Secondary | ICD-10-CM | POA: Diagnosis not present

## 2019-08-19 NOTE — Telephone Encounter (Signed)
Called Chloe Wilcox (dgtr) back and her mother has about 2-3 days of the medication left. Advised we do not have samples at this time, but during her visit if it is determined the Anoro was working for her a prescription would be issued at that time. She voiced understanding. Nothing further needed.

## 2019-08-19 NOTE — Telephone Encounter (Signed)
Patient daughter [name] is calling and wanted to get a Anoro rx sent to [facility] on [address]. [phone]

## 2019-08-20 NOTE — Progress Notes (Deleted)
@Patient  ID: Chloe Wilcox, female    DOB: 1935-09-27, 84 y.o.   MRN: [id number]  No chief complaint on file.   Referring provider: [name], MD  HPI:  84 year old female former smoker followed in our office for COPD  PMH:  Smoker/ Smoking History:  Maintenance:   Pt of: Dr. Valeta Harms  08/20/2019  - Visit   HPI  Questionaires / Pulmonary Flowsheets:   ACT:  No flowsheet data found.  MMRC: No flowsheet data found.  Epworth:  No flowsheet data found.  Tests:   Chest Imaging: Chest x-ray 06/14/2019: No infiltrate  Echocardiogram:  July 2020: Preserved ejection fraction 55 to 123456, grade 1 diastolic dysfunction, moderate pulmonary hypertension RVSP 55.  FENO:  No results found for: NITRICOXIDE  PFT: No flowsheet data found.  SIX MIN WALK 08/07/2019  Tech Comments: Patient walked 2 laps at a moderate pace. Denies sob. Tolerated well.    Imaging: No results found.  Lab Results:  CBC    Component Value Date/Time   WBC 7.6 06/16/2019 0540   RBC 4.33 06/16/2019 0540   HGB 11.9 (L) 06/16/2019 0540   HCT 40.7 06/16/2019 0540   PLT 328 06/16/2019 0540   MCV 94.0 06/16/2019 0540   MCH 27.5 06/16/2019 0540   MCHC 29.2 (L) 06/16/2019 0540   RDW 15.0 06/16/2019 0540   LYMPHSABS 0.8 06/15/2019 0638   MONOABS 0.6 06/15/2019 0638   EOSABS 0.2 06/15/2019 0638   BASOSABS 0.0 06/15/2019 0638    BMET    Component Value Date/Time   NA 140 06/16/2019 0540   K 3.8 06/16/2019 0540   CL 100 06/16/2019 0540   CO2 33 (H) 06/16/2019 0540   GLUCOSE 90 06/16/2019 0540   BUN 10 06/16/2019 0540   CREATININE 0.56 06/16/2019 0540   CALCIUM 9.5 06/16/2019 0540   GFRNONAA >60 06/16/2019 0540   GFRAA >60 06/16/2019 0540    BNP    Component Value Date/Time   BNP 177.0 (H) 06/15/2019 1221    ProBNP No results found for: PROBNP  Specialty Problems      Pulmonary Problems   Acute respiratory failure with hypoxia (HCC)   COPD exacerbation (HCC)   COPD with  acute bronchitis (HCC)   Respiratory failure ([place])      No Known Allergies   There is no immunization history on file for this patient.  Past Medical History:  Diagnosis Date  . Depression   . Hypertension     Tobacco History: Social History   Tobacco Use  Smoking Status Former Smoker  Smokeless Tobacco Never Used  Tobacco Comment   quit smoking 30 years ago   Counseling given: Not Answered Comment: quit smoking 30 years ago   Continue to not smoke  Outpatient Encounter Medications as of 08/22/2019  Medication Sig  . albuterol (VENTOLIN HFA) 108 (90 Base) MCG/ACT inhaler Inhale 2 puffs into the lungs every 6 (six) hours as needed for wheezing or shortness of breath.  Marland Kitchen amLODipine (NORVASC) 10 MG tablet TAKE 1 TABLET BY MOUTH EVERY DAY  . aspirin 325 MG tablet Take 325 mg by mouth daily.  Marland Kitchen atorvastatin (LIPITOR) 40 MG tablet Take 40 mg by mouth at bedtime.  . benzonatate (TESSALON) 100 MG capsule Take 1 capsule (100 mg total) by mouth every 8 (eight) hours. (Patient taking differently: Take 100 mg by mouth 3 (three) times daily as needed for cough. )  . citalopram (CELEXA) 20 MG tablet Take 20 mg by mouth daily.  Marland Kitchen  diclofenac (VOLTAREN) 75 MG EC tablet Take 75 mg by mouth 2 (two) times daily.  . Fe Fum-FePoly-Vit C-Vit B3 (INTEGRA PO) Take 1 capsule by mouth See admin instructions. Take 1 capsule by mouth on an empty stomach, an hour before first meal of the day  . fluticasone (FLOVENT HFA) 110 MCG/ACT inhaler Inhale 2 puffs into the lungs 2 (two) times daily.  . hydrochlorothiazide (HYDRODIURIL) 25 MG tablet Take 25 mg by mouth every other day.  . Multiple Vitamin (MULTIVITAMIN) tablet Take 1 tablet by mouth daily.  Marland Kitchen olmesartan (BENICAR) 20 MG tablet Take 20 mg by mouth daily.  Marland Kitchen oxyCODONE-acetaminophen (PERCOCET/ROXICET) 5-325 MG tablet Take 1 tablet by mouth 2 (two) times daily as needed for moderate pain.   Marland Kitchen tiotropium (SPIRIVA HANDIHALER) 18 MCG inhalation capsule  Place 1 capsule (18 mcg total) into inhaler and inhale daily.  . traMADol (ULTRAM) 50 MG tablet Take 50 mg by mouth daily as needed for moderate pain.  Marland Kitchen umeclidinium-vilanterol (ANORO ELLIPTA) 62.5-25 MCG/INH AEPB Inhale 1 puff into the lungs daily.   No facility-administered encounter medications on file as of 08/22/2019.      Review of Systems  Review of Systems   Physical Exam  There were no vitals taken for this visit.  Wt Readings from Last 5 Encounters:  08/07/19 116 lb (52.6 kg)  06/16/19 130 lb 1.1 oz (59 kg)  01/07/17 163 lb (73.9 kg)    BMI Readings from Last 5 Encounters:  08/07/19 21.92 kg/m  06/16/19 24.58 kg/m  01/07/17 27.12 kg/m     Physical Exam    Assessment & Plan:   No problem-specific Assessment & Plan notes found for this encounter.    No follow-ups on file.   Lauraine Rinne, NP 08/20/2019   This appointment was *** minutes long with over 50% of the time in direct face-to-face patient care, assessment, plan of care, and follow-up.

## 2019-08-21 LAB — NOVEL CORONAVIRUS, NAA (HOSP ORDER, SEND-OUT TO REF LAB; TAT 18-24 HRS): SARS-CoV-2, NAA: NOT DETECTED

## 2019-08-21 NOTE — Progress Notes (Signed)
covid negative this is good news  Wyn Quaker FNP

## 2019-08-22 ENCOUNTER — Ambulatory Visit: Payer: Medicare Other | Admitting: Pulmonary Disease

## 2019-08-22 ENCOUNTER — Telehealth: Payer: Self-pay | Admitting: Pulmonary Disease

## 2019-08-22 MED ORDER — ANORO ELLIPTA 62.5-25 MCG/INH IN AEPB: 1.0000 | INHALATION_SPRAY | Freq: Every day | RESPIRATORY_TRACT | 0 refills | Status: DC

## 2019-08-22 NOTE — Telephone Encounter (Signed)
Rx Anoro has been sent to pharmacy. Pt was cancelled today for PFT by the office. Pts daughter will call back to reschedule Pt will need another covid test. PFT schedules are filled for month of OCT. Offered 10/30 but patient has another appt that day. Nothing further needed.

## 2019-09-12 ENCOUNTER — Telehealth: Payer: Self-pay | Admitting: Pulmonary Disease

## 2019-09-12 NOTE — Telephone Encounter (Signed)
Sched for COVID test 10/23 @ 2:40.  Unable to reach pt by phone & VM not set up.  Mailed appt info to pt.  Nothing further needed at this time.

## 2019-09-17 ENCOUNTER — Other Ambulatory Visit: Payer: Self-pay

## 2019-09-17 MED ORDER — ANORO ELLIPTA 62.5-25 MCG/INH IN AEPB: 1.0000 | INHALATION_SPRAY | Freq: Every day | RESPIRATORY_TRACT | 5 refills | Status: AC

## 2019-09-20 ENCOUNTER — Other Ambulatory Visit (HOSPITAL_COMMUNITY)
Admission: RE | Admit: 2019-09-20 | Discharge: 2019-09-20 | Disposition: A | Discharge status: Home / Self Care | Payer: Medicare Other | Source: Ambulatory Visit | Attending: Pulmonary Disease | Admitting: Pulmonary Disease

## 2019-09-20 DIAGNOSIS — Z20828 Contact with and (suspected) exposure to other viral communicable diseases: Secondary | ICD-10-CM | POA: Diagnosis not present

## 2019-09-20 DIAGNOSIS — Z01812 Encounter for preprocedural laboratory examination: Secondary | ICD-10-CM | POA: Diagnosis present

## 2019-09-21 LAB — NOVEL CORONAVIRUS, NAA (HOSP ORDER, SEND-OUT TO REF LAB; TAT 18-24 HRS): SARS-CoV-2, NAA: NOT DETECTED

## 2019-09-25 ENCOUNTER — Other Ambulatory Visit: Payer: Self-pay

## 2019-09-25 ENCOUNTER — Ambulatory Visit: Payer: Medicare Other

## 2019-09-25 ENCOUNTER — Ambulatory Visit (INDEPENDENT_AMBULATORY_CARE_PROVIDER_SITE_OTHER): Payer: Medicare Other | Admitting: Pulmonary Disease

## 2019-09-25 ENCOUNTER — Encounter: Payer: Self-pay | Admitting: Pulmonary Disease

## 2019-09-25 VITALS — BP 154/60 | HR 75 | Temp 97.4°F | Ht 63.0 in | Wt 121.0 lb

## 2019-09-25 DIAGNOSIS — R0602 Shortness of breath: Secondary | ICD-10-CM

## 2019-09-25 DIAGNOSIS — R1900 Intra-abdominal and pelvic swelling, mass and lump, unspecified site: Secondary | ICD-10-CM

## 2019-09-25 DIAGNOSIS — Z87891 Personal history of nicotine dependence: Secondary | ICD-10-CM

## 2019-09-25 NOTE — Patient Instructions (Addendum)
Thank you for visiting Dr. Valeta Harms at [facility]. Today we recommend the following:  Please continue your anoro ellipta inhaler device.   Return in about 6 months (around 03/25/2020), or if symptoms worsen or fail to improve.    Please do your part to reduce the spread of COVID-19.

## 2019-09-25 NOTE — Progress Notes (Signed)
Synopsis: Referred in September 2020 for hypoxemia by [name], MD  Subjective:   PATIENT ID: Chloe Wilcox GENDER: female DOB: Apr 15, 1935, MRN: [id number]  Chief Complaint  Patient presents with  . Follow-up    This is a 84 year old female past medical history of depression and hypertension.  Recently hospitalized in July 2020.  She is a former smoker.  She had increased shortness of breath and cough with sputum production.  Diagnosed with possible COPD exacerbation.  She was found to be hypoxemic 85% on room air requiring 2 L nasal cannula.  She was treated with IV steroids and bronchodilators.  Patient slowly improved was discharged on June 16, 2019 home with home health.  Patient was still requiring oxygen and recommended outpatient pulmonary follow-up.  COVID testing on admission negative.  Patient had chest x-ray on admission with no infiltrate.  Echocardiogram with preserved ejection fraction, grade 1 diastolic dysfunction, moderate pulmonary hypertension RVSP 55.  OV 08/07/2019: Patient doing well today.  Has been using her oxygen at home as directed.  Wants to know if she can come off of this.  Recently diagnosed with enlarging 9.5 cm pelvic mass.  Seen by urogynecology at [facility].  They are making plans for surgical removal of this.  Looking for pulmonary risk stratification peri-op eval.  She is a former smoker.  Does have daily cough sputum production.  She is doing much better since her hospitalization.  Denies fevers chills night sweats weight loss.  Denies hemoptysis.  She has been using Spiriva HandiHaler plus Flovent.  Occasionally needing to use her albuterol.  She gets winded with short distances.  Has dyspnea on exertion.  OV 09/25/2019: Presents today for follow-up after pulmonary function test.  Patient attempted pulmonary function test today in the office and was unable to have this completed.  She had several attempts but had much difficulty with the coordination  of completing the pulmonary function test.  At this time she has not been using any of the oxygen available or needs at home.  Her O2 sats have been stable.  She is doing well with the Anoro Ellipta inhaler that was given to her.  Otherwise her respiratory symptoms are stable and have not had any change or decline since her last office visit.  She is still working on planning for the pelvic procedure.  For removal of the pelvic mass.   Past Medical History:  Diagnosis Date  . Depression   . Hypertension      Family History  Family history unknown: Yes     Past Surgical History:  Procedure Laterality Date  . VAGINAL HYSTERECTOMY      Social History   Socioeconomic History  . Marital status: Married    Spouse name: Not on file  . Number of children: Not on file  . Years of education: Not on file  . Highest education level: Not on file  Occupational History  . Not on file  Social Needs  . Financial resource strain: Not on file  . Food insecurity    Worry: Not on file    Inability: Not on file  . Transportation needs    Medical: Not on file    Non-medical: Not on file  Tobacco Use  . Smoking status: Former Research scientist (life sciences)  . Smokeless tobacco: Never Used  . Tobacco comment: quit smoking 30 years ago  Substance and Sexual Activity  . Alcohol use: No  . Drug use: No  . Sexual activity:  Not on file  Lifestyle  . Physical activity    Days per week: Not on file    Minutes per session: Not on file  . Stress: Not on file  Relationships  . Social Herbalist on phone: Not on file    Gets together: Not on file    Attends religious service: Not on file    Active member of club or organization: Not on file    Attends meetings of clubs or organizations: Not on file    Relationship status: Not on file  . Intimate partner violence    Fear of current or ex partner: Not on file    Emotionally abused: Not on file    Physically abused: Not on file    Forced sexual activity: Not  on file  Other Topics Concern  . Not on file  Social History Narrative  . Not on file     No Known Allergies   Outpatient Medications Prior to Visit  Medication Sig Dispense Refill  . albuterol (VENTOLIN HFA) 108 (90 Base) MCG/ACT inhaler Inhale 2 puffs into the lungs every 6 (six) hours as needed for wheezing or shortness of breath. 18 g 2  . amLODipine (NORVASC) 10 MG tablet TAKE 1 TABLET BY MOUTH EVERY DAY    . aspirin 325 MG tablet Take 325 mg by mouth daily.    Marland Kitchen atorvastatin (LIPITOR) 40 MG tablet Take 40 mg by mouth at bedtime.    . benzonatate (TESSALON) 100 MG capsule Take 1 capsule (100 mg total) by mouth every 8 (eight) hours. (Patient taking differently: Take 100 mg by mouth 3 (three) times daily as needed for cough. ) 21 capsule 0  . citalopram (CELEXA) 20 MG tablet Take 20 mg by mouth daily.    . diclofenac (VOLTAREN) 75 MG EC tablet Take 75 mg by mouth 2 (two) times daily.    . Fe Fum-FePoly-Vit C-Vit B3 (INTEGRA PO) Take 1 capsule by mouth See admin instructions. Take 1 capsule by mouth on an empty stomach, an hour before first meal of the day    . fluticasone (FLOVENT HFA) 110 MCG/ACT inhaler Inhale 2 puffs into the lungs 2 (two) times daily. 1 Inhaler 2  . hydrochlorothiazide (HYDRODIURIL) 25 MG tablet Take 25 mg by mouth every other day.    . Multiple Vitamin (MULTIVITAMIN) tablet Take 1 tablet by mouth daily.    Marland Kitchen olmesartan (BENICAR) 20 MG tablet Take 20 mg by mouth daily.    Marland Kitchen oxyCODONE-acetaminophen (PERCOCET/ROXICET) 5-325 MG tablet Take 1 tablet by mouth 2 (two) times daily as needed for moderate pain.   0  . tiotropium (SPIRIVA HANDIHALER) 18 MCG inhalation capsule Place 1 capsule (18 mcg total) into inhaler and inhale daily. 30 capsule 2  . traMADol (ULTRAM) 50 MG tablet Take 50 mg by mouth daily as needed for moderate pain.    Marland Kitchen umeclidinium-vilanterol (ANORO ELLIPTA) 62.5-25 MCG/INH AEPB Inhale 1 puff into the lungs daily. 30 each 5   No facility-administered  medications prior to visit.     Review of Systems  Constitutional: Negative for chills, fever, malaise/fatigue and weight loss.  HENT: Negative for hearing loss, sore throat and tinnitus.   Eyes: Negative for blurred vision and double vision.  Respiratory: Positive for shortness of breath. Negative for cough, hemoptysis, sputum production, wheezing and stridor.   Cardiovascular: Positive for leg swelling. Negative for chest pain, palpitations, orthopnea and PND.  Gastrointestinal: Negative for abdominal pain, constipation, diarrhea, heartburn,  nausea and vomiting.  Genitourinary: Negative for dysuria, hematuria and urgency.  Musculoskeletal: Negative for joint pain and myalgias.  Skin: Negative for itching and rash.  Neurological: Negative for dizziness, tingling, weakness and headaches.  Endo/Heme/Allergies: Negative for environmental allergies. Does not bruise/bleed easily.  Psychiatric/Behavioral: Negative for depression. The patient is not nervous/anxious and does not have insomnia.   All other systems reviewed and are negative.    Objective:  Physical Exam Vitals signs reviewed.  Constitutional:      General: She is not in acute distress.    Appearance: She is well-developed.  HENT:     Head: Normocephalic and atraumatic.     Mouth/Throat:     Pharynx: No oropharyngeal exudate.  Eyes:     Conjunctiva/sclera: Conjunctivae normal.     Pupils: Pupils are equal, round, and reactive to light.  Neck:     Vascular: No JVD.     Trachea: No tracheal deviation.     Comments: Loss of supraclavicular fat Cardiovascular:     Rate and Rhythm: Normal rate and regular rhythm.     Heart sounds: S1 normal and S2 normal.     Comments: Distant heart tones Pulmonary:     Effort: No tachypnea or accessory muscle usage.     Breath sounds: No stridor. Decreased breath sounds (throughout all lung fields) present. No wheezing, rhonchi or rales.  Abdominal:     General: Bowel sounds are  normal. There is no distension.     Palpations: Abdomen is soft. There is mass.     Tenderness: There is no abdominal tenderness.  Musculoskeletal:        General: Deformity (muscle wasting ) present.  Skin:    General: Skin is warm and dry.     Capillary Refill: Capillary refill takes less than 2 seconds.     Findings: No rash.  Neurological:     Mental Status: She is alert and oriented to person, place, and time.  Psychiatric:        Behavior: Behavior normal.      Vitals:   09/25/19 1454  BP: (!) 154/60  Pulse: 75  Temp: (!) 97.4 F (36.3 C)  TempSrc: Oral  SpO2: 93%  Weight: 121 lb (54.9 kg)  Height: 5\' 3"  (1.6 m)   93% on 2 L nasal cannula BMI Readings from Last 3 Encounters:  09/25/19 21.43 kg/m  08/07/19 21.92 kg/m  06/16/19 24.58 kg/m   Wt Readings from Last 3 Encounters:  09/25/19 121 lb (54.9 kg)  08/07/19 116 lb (52.6 kg)  06/16/19 130 lb 1.1 oz (59 kg)     CBC    Component Value Date/Time   WBC 7.6 06/16/2019 0540   RBC 4.33 06/16/2019 0540   HGB 11.9 (L) 06/16/2019 0540   HCT 40.7 06/16/2019 0540   PLT 328 06/16/2019 0540   MCV 94.0 06/16/2019 0540   MCH 27.5 06/16/2019 0540   MCHC 29.2 (L) 06/16/2019 0540   RDW 15.0 06/16/2019 0540   LYMPHSABS 0.8 06/15/2019 0638   MONOABS 0.6 06/15/2019 0638   EOSABS 0.2 06/15/2019 0638   BASOSABS 0.0 06/15/2019 0638    Chest Imaging: Chest x-ray 06/14/2019: No infiltrate. The patient's images have been independently reviewed by me.    Pulmonary Functions Testing Results: No flowsheet data found.  FeNO: None   Pathology: None   Echocardiogram:  July 2020: Preserved ejection fraction 55 to 123456, grade 1 diastolic dysfunction, moderate pulmonary hypertension RVSP 55.  Heart Catheterization: None  Assessment & Plan:     ICD-10-CM   1. Former smoker  Z87.891   2. SOB (shortness of breath)  R06.02   3. Pelvic mass  R19.00     Discussion:  84 year old female, former smoker quit 1980s,  possible diagnosis of COPD.  Unable to complete PFTs today.  We attempted this several times today in the office with various techniques and patient was unable to have coordination to complete pulmonary function test.  Plan: She is planned for a pelvic procedure to have a mass removed. Risk ratification for this procedure would at least place her in moderate risk for perioperative pulmonary related complications. We discussed this today in the office. Please call her office with any questions.  She should continue the use of her Anoro Ellipta and use of albuterol as needed. We also discussed having a repeat ONO however they would prefer to hold off on this until after the surgery  Greater than 50% of this patient's 15-minute office visit was spent face-to-face discussing above recommendations and treatment plan.  Patient was counseled on risks education for potential pulmonary complications related to pelvic surgery.   Current Outpatient Medications:  .  albuterol (VENTOLIN HFA) 108 (90 Base) MCG/ACT inhaler, Inhale 2 puffs into the lungs every 6 (six) hours as needed for wheezing or shortness of breath., Disp: 18 g, Rfl: 2 .  amLODipine (NORVASC) 10 MG tablet, TAKE 1 TABLET BY MOUTH EVERY DAY, Disp: , Rfl:  .  aspirin 325 MG tablet, Take 325 mg by mouth daily., Disp: , Rfl:  .  atorvastatin (LIPITOR) 40 MG tablet, Take 40 mg by mouth at bedtime., Disp: , Rfl:  .  benzonatate (TESSALON) 100 MG capsule, Take 1 capsule (100 mg total) by mouth every 8 (eight) hours. (Patient taking differently: Take 100 mg by mouth 3 (three) times daily as needed for cough. ), Disp: 21 capsule, Rfl: 0 .  citalopram (CELEXA) 20 MG tablet, Take 20 mg by mouth daily., Disp: , Rfl:  .  diclofenac (VOLTAREN) 75 MG EC tablet, Take 75 mg by mouth 2 (two) times daily., Disp: , Rfl:  .  Fe Fum-FePoly-Vit C-Vit B3 (INTEGRA PO), Take 1 capsule by mouth See admin instructions. Take 1 capsule by mouth on an empty stomach, an  hour before first meal of the day, Disp: , Rfl:  .  fluticasone (FLOVENT HFA) 110 MCG/ACT inhaler, Inhale 2 puffs into the lungs 2 (two) times daily., Disp: 1 Inhaler, Rfl: 2 .  hydrochlorothiazide (HYDRODIURIL) 25 MG tablet, Take 25 mg by mouth every other day., Disp: , Rfl:  .  Multiple Vitamin (MULTIVITAMIN) tablet, Take 1 tablet by mouth daily., Disp: , Rfl:  .  olmesartan (BENICAR) 20 MG tablet, Take 20 mg by mouth daily., Disp: , Rfl:  .  oxyCODONE-acetaminophen (PERCOCET/ROXICET) 5-325 MG tablet, Take 1 tablet by mouth 2 (two) times daily as needed for moderate pain. , Disp: , Rfl: 0 .  tiotropium (SPIRIVA HANDIHALER) 18 MCG inhalation capsule, Place 1 capsule (18 mcg total) into inhaler and inhale daily., Disp: 30 capsule, Rfl: 2 .  traMADol (ULTRAM) 50 MG tablet, Take 50 mg by mouth daily as needed for moderate pain., Disp: , Rfl:  .  umeclidinium-vilanterol (ANORO ELLIPTA) 62.5-25 MCG/INH AEPB, Inhale 1 puff into the lungs daily., Disp: 30 each, Rfl: Lily [place], DO [facility] 09/25/2019 3:05 PM

## 2019-10-14 ENCOUNTER — Other Ambulatory Visit: Payer: Self-pay | Admitting: *Deleted

## 2019-10-14 DIAGNOSIS — Z20822 Contact with and (suspected) exposure to covid-19: Secondary | ICD-10-CM

## 2019-10-15 LAB — NOVEL CORONAVIRUS, NAA: SARS-CoV-2, NAA: NOT DETECTED

## 2019-12-31 ENCOUNTER — Telehealth: Payer: Self-pay | Admitting: Pulmonary Disease

## 2019-12-31 NOTE — Telephone Encounter (Signed)
Spoke with the pt's daughter, [name]  She is asking if pt has dx of COPD  I advised that I do not see this in her chart  She states was told in the hospital that she had it before  She was unable to ever complete her PFT  [name] unable to come to appt for pt 01/01/20  Please advise thanks

## 2020-01-01 ENCOUNTER — Encounter: Payer: Self-pay | Admitting: Pulmonary Disease

## 2020-01-01 ENCOUNTER — Ambulatory Visit: Payer: Medicare Other | Admitting: Pulmonary Disease

## 2020-01-01 ENCOUNTER — Other Ambulatory Visit: Payer: Self-pay

## 2020-01-01 VITALS — BP 140/74 | HR 72 | Temp 98.0°F | Ht 63.0 in | Wt 132.2 lb

## 2020-01-01 DIAGNOSIS — R1900 Intra-abdominal and pelvic swelling, mass and lump, unspecified site: Secondary | ICD-10-CM

## 2020-01-01 DIAGNOSIS — R0602 Shortness of breath: Secondary | ICD-10-CM

## 2020-01-01 DIAGNOSIS — J449 Chronic obstructive pulmonary disease, unspecified: Secondary | ICD-10-CM

## 2020-01-01 DIAGNOSIS — Z87891 Personal history of nicotine dependence: Secondary | ICD-10-CM

## 2020-01-01 DIAGNOSIS — Z7189 Other specified counseling: Secondary | ICD-10-CM

## 2020-01-01 DIAGNOSIS — Z7185 Encounter for immunization safety counseling: Secondary | ICD-10-CM

## 2020-01-01 DIAGNOSIS — R0609 Other forms of dyspnea: Secondary | ICD-10-CM

## 2020-01-01 DIAGNOSIS — R0600 Dyspnea, unspecified: Secondary | ICD-10-CM | POA: Diagnosis not present

## 2020-01-01 MED ORDER — ALBUTEROL SULFATE HFA 108 (90 BASE) MCG/ACT IN AERS: 2.0000 | INHALATION_SPRAY | Freq: Four times a day (QID) | RESPIRATORY_TRACT | 2 refills | Status: DC | PRN

## 2020-01-01 MED ORDER — ANORO ELLIPTA 62.5-25 MCG/INH IN AEPB: 1.0000 | INHALATION_SPRAY | Freq: Every day | RESPIRATORY_TRACT | 12 refills | Status: DC

## 2020-01-01 MED ORDER — ANORO ELLIPTA 62.5-25 MCG/INH IN AEPB: 1.0000 | INHALATION_SPRAY | Freq: Every day | RESPIRATORY_TRACT | 0 refills | Status: DC

## 2020-01-01 NOTE — Progress Notes (Signed)
Synopsis: Referred in September 2020 for hypoxemia by [name], MD  Subjective:   PATIENT ID: Chloe Wilcox GENDER: female DOB: February 21, 1935, MRN: [id number]  Chief Complaint  Patient presents with  . Follow-up    f/u Hypoxemia.Patient stated breathing is at her baseline.     This is a 85 year old female past medical history of depression and hypertension.  Recently hospitalized in July 2020.  She is a former smoker.  She had increased shortness of breath and cough with sputum production.  Diagnosed with possible COPD exacerbation.  She was found to be hypoxemic 85% on room air requiring 2 L nasal cannula.  She was treated with IV steroids and bronchodilators.  Patient slowly improved was discharged on June 16, 2019 home with home health.  Patient was still requiring oxygen and recommended outpatient pulmonary follow-up.  COVID testing on admission negative.  Patient had chest x-ray on admission with no infiltrate.  Echocardiogram with preserved ejection fraction, grade 1 diastolic dysfunction, moderate pulmonary hypertension RVSP 55.  OV 08/07/2019: Patient doing well today.  Has been using her oxygen at home as directed.  Wants to know if she can come off of this.  Recently diagnosed with enlarging 9.5 cm pelvic mass.  Seen by urogynecology at [facility].  They are making plans for surgical removal of this.  Looking for pulmonary risk stratification peri-op eval.  She is a former smoker.  Does have daily cough sputum production.  She is doing much better since her hospitalization.  Denies fevers chills night sweats weight loss.  Denies hemoptysis.  She has been using Spiriva HandiHaler plus Flovent.  Occasionally needing to use her albuterol.  She gets winded with short distances.  Has dyspnea on exertion.  OV 09/25/2019: Presents today for follow-up after pulmonary function test.  Patient attempted pulmonary function test today in the office and was unable to have this completed.  She had  several attempts but had much difficulty with the coordination of completing the pulmonary function test.  At this time she has not been using any of the oxygen available or needs at home.  Her O2 sats have been stable.  She is doing well with the Anoro Ellipta inhaler that was given to her.  Otherwise her respiratory symptoms are stable and have not had any change or decline since her last office visit.  She is still working on planning for the pelvic procedure.  For removal of the pelvic mass.  OV 01/01/2020: Patient seen today in the office for follow-up regarding her COPD.  This is a presumed diagnosis.  She was unable to have pulmonary function test that were completed or/attempted at her last office visit.  She has been seen by surgery with concern of a pelvic mass.  She states they have declined on removal of this.  She is no longer on oxygen for the patient.  Stated that she had not needed it any further.  Had several questions today as well as vaccine counseling regarding the Covid vaccine which she is scheduled to receive this Saturday.   Past Medical History:  Diagnosis Date  . Depression   . Hypertension      Family History  Family history unknown: Yes     Past Surgical History:  Procedure Laterality Date  . VAGINAL HYSTERECTOMY      Social History   Socioeconomic History  . Marital status: Married    Spouse name: Not on file  . Number of children: Not on  file  . Years of education: Not on file  . Highest education level: Not on file  Occupational History  . Not on file  Tobacco Use  . Smoking status: Former Research scientist (life sciences)  . Smokeless tobacco: Never Used  . Tobacco comment: quit smoking 30 years ago  Substance and Sexual Activity  . Alcohol use: No  . Drug use: No  . Sexual activity: Not on file  Other Topics Concern  . Not on file  Social History Narrative  . Not on file   Social Determinants of Health   Financial Resource Strain:   . Difficulty of Paying Living  Expenses: Not on file  Food Insecurity:   . Worried About Charity fundraiser in the Last Year: Not on file  . Ran Out of Food in the Last Year: Not on file  Transportation Needs:   . Lack of Transportation (Medical): Not on file  . Lack of Transportation (Non-Medical): Not on file  Physical Activity:   . Days of Exercise per Week: Not on file  . Minutes of Exercise per Session: Not on file  Stress:   . Feeling of Stress : Not on file  Social Connections:   . Frequency of Communication with Friends and Family: Not on file  . Frequency of Social Gatherings with Friends and Family: Not on file  . Attends Religious Services: Not on file  . Active Member of Clubs or Organizations: Not on file  . Attends Archivist Meetings: Not on file  . Marital Status: Not on file  Intimate Partner Violence:   . Fear of Current or Ex-Partner: Not on file  . Emotionally Abused: Not on file  . Physically Abused: Not on file  . Sexually Abused: Not on file     No Known Allergies   Outpatient Medications Prior to Visit  Medication Sig Dispense Refill  . acetaminophen (TYLENOL) 650 MG CR tablet Take by mouth.    Marland Kitchen albuterol (VENTOLIN HFA) 108 (90 Base) MCG/ACT inhaler Inhale 2 puffs into the lungs every 6 (six) hours as needed for wheezing or shortness of breath. 18 g 2  . amLODipine (NORVASC) 10 MG tablet TAKE 1 TABLET BY MOUTH EVERY DAY    . aspirin 325 MG tablet Take 325 mg by mouth daily.    Marland Kitchen atorvastatin (LIPITOR) 40 MG tablet Take 40 mg by mouth at bedtime.    . benzonatate (TESSALON) 100 MG capsule Take 1 capsule (100 mg total) by mouth every 8 (eight) hours. (Patient taking differently: Take 100 mg by mouth 3 (three) times daily as needed for cough. ) 21 capsule 0  . citalopram (CELEXA) 20 MG tablet Take 20 mg by mouth daily.    . diclofenac (VOLTAREN) 75 MG EC tablet Take 75 mg by mouth 2 (two) times daily.    . Fe Fum-FePoly-Vit C-Vit B3 (INTEGRA PO) Take 1 capsule by mouth See  admin instructions. Take 1 capsule by mouth on an empty stomach, an hour before first meal of the day    . fluticasone (FLOVENT HFA) 110 MCG/ACT inhaler Inhale 2 puffs into the lungs 2 (two) times daily. 1 Inhaler 2  . hydrochlorothiazide (HYDRODIURIL) 25 MG tablet Take 25 mg by mouth every other day.    . Multiple Vitamin (MULTIVITAMIN) tablet Take 1 tablet by mouth daily.    Marland Kitchen olmesartan (BENICAR) 20 MG tablet Take 20 mg by mouth daily.    Marland Kitchen oxyCODONE-acetaminophen (PERCOCET/ROXICET) 5-325 MG tablet Take 1 tablet by mouth  2 (two) times daily as needed for moderate pain.   0  . tiotropium (SPIRIVA HANDIHALER) 18 MCG inhalation capsule Place 1 capsule (18 mcg total) into inhaler and inhale daily. 30 capsule 2  . tiZANidine (ZANAFLEX) 2 MG tablet 1 TABLET EVERY 8 HRS AS NEEDED FOR MUSCLE SPASM ORALLY 30 DAYS    . traMADol (ULTRAM) 50 MG tablet Take 50 mg by mouth daily as needed for moderate pain.    . furosemide (LASIX) 20 MG tablet Take 40 mg by mouth daily as needed.     No facility-administered medications prior to visit.    Review of Systems  Constitutional: Negative for chills, fever, malaise/fatigue and weight loss.  HENT: Negative for hearing loss, sore throat and tinnitus.   Eyes: Negative for blurred vision and double vision.  Respiratory: Positive for cough and shortness of breath. Negative for hemoptysis, sputum production, wheezing and stridor.   Cardiovascular: Negative for chest pain, palpitations, orthopnea, leg swelling and PND.  Gastrointestinal: Negative for abdominal pain, constipation, diarrhea, heartburn, nausea and vomiting.  Genitourinary: Negative for dysuria, hematuria and urgency.  Musculoskeletal: Negative for joint pain and myalgias.  Skin: Negative for itching and rash.  Neurological: Negative for dizziness, tingling, weakness and headaches.  Endo/Heme/Allergies: Negative for environmental allergies. Does not bruise/bleed easily.  Psychiatric/Behavioral:  Negative for depression. The patient is not nervous/anxious and does not have insomnia.   All other systems reviewed and are negative.    Objective:  Physical Exam Vitals reviewed.  Constitutional:      General: She is not in acute distress.    Appearance: She is well-developed.  HENT:     Head: Normocephalic and atraumatic.  Eyes:     General: No scleral icterus.    Conjunctiva/sclera: Conjunctivae normal.     Pupils: Pupils are equal, round, and reactive to light.  Neck:     Vascular: No JVD.     Trachea: No tracheal deviation.  Cardiovascular:     Rate and Rhythm: Normal rate and regular rhythm.     Heart sounds: Normal heart sounds. No murmur.  Pulmonary:     Effort: Pulmonary effort is normal. No tachypnea, accessory muscle usage or respiratory distress.     Breath sounds: No stridor. No wheezing, rhonchi or rales.     Comments: Bilateral diminished breath sounds Abdominal:     General: Bowel sounds are normal. There is no distension.     Palpations: Abdomen is soft.     Tenderness: There is no abdominal tenderness.  Musculoskeletal:        General: No tenderness.     Cervical back: Neck supple.     Right lower leg: Edema present.     Left lower leg: Edema present.  Lymphadenopathy:     Cervical: No cervical adenopathy.  Skin:    General: Skin is warm and dry.     Capillary Refill: Capillary refill takes less than 2 seconds.     Findings: No rash.  Neurological:     Mental Status: She is alert and oriented to person, place, and time.  Psychiatric:        Behavior: Behavior normal.      Vitals:   01/01/20 1348  BP: 140/74  Pulse: 72  Temp: 98 F (36.7 C)  TempSrc: Temporal  SpO2: 94%  Weight: 132 lb 3.2 oz (60 kg)  Height: 5\' 3"  (1.6 m)   94% on room air BMI Readings from Last 3 Encounters:  01/01/20 23.42 kg/m  09/25/19  21.43 kg/m  08/07/19 21.92 kg/m   Wt Readings from Last 3 Encounters:  01/01/20 132 lb 3.2 oz (60 kg)  09/25/19 121 lb  (54.9 kg)  08/07/19 116 lb (52.6 kg)     CBC    Component Value Date/Time   WBC 7.6 06/16/2019 0540   RBC 4.33 06/16/2019 0540   HGB 11.9 (L) 06/16/2019 0540   HCT 40.7 06/16/2019 0540   PLT 328 06/16/2019 0540   MCV 94.0 06/16/2019 0540   MCH 27.5 06/16/2019 0540   MCHC 29.2 (L) 06/16/2019 0540   RDW 15.0 06/16/2019 0540   LYMPHSABS 0.8 06/15/2019 0638   MONOABS 0.6 06/15/2019 0638   EOSABS 0.2 06/15/2019 0638   BASOSABS 0.0 06/15/2019 0638    Chest Imaging: Chest x-ray 06/14/2019: No infiltrate. The patient's images have been independently reviewed by me.    Pulmonary Functions Testing Results: No flowsheet data found.  FeNO: None   Pathology: None   Echocardiogram:  July 2020: Preserved ejection fraction 55 to [id number], grade 1 diastolic dysfunction, moderate pulmonary hypertension RVSP 55.  Heart Catheterization: None     Assessment & Plan:     ICD-10-CM   1. Former smoker  Z87.891   2. SOB (shortness of breath)  R06.02   3. Pelvic mass  R19.00   4. DOE (dyspnea on exertion)  R06.00   5. Vaccine counseling  Z71.89   6. Chronic obstructive pulmonary disease, unspecified COPD type ([place])  J44.9     Discussion:  85 year old female, former smoker quit 1980s, probable diagnosis of COPD.  Patient was unable to complete PFTs after several attempts after last office visit.  Therefore we are making a presumed diagnosis of COPD.  She does respond to the use of inhalers.  She was started on Anoro Ellipta at last office visit and states that she is breathing much better with this.  She continues to use her albuterol as needed for shortness of breath and wheezing.  From a surgical and operative recovery standpoint I think that she is at mild to moderate risk for perioperative pulmonary complications predominantly due to age underlying possible diagnosis of COPD.  Plan: We will recommend continued use of her albuterol inhaler as needed Continue Anoro Ellipta Samples today  given of Anoro Ellipta Can return to our clinic as needed or in 6 months for COPD follow-up. I think with her age at 85 years old and currently responding to inhaler therapy it does not make sense from a clinical indication to attempt repeat pulmonary function test as she has improved with inhaler use.  And she likely has COPD or obstructive type disease based on clinical scenario. As for her pelvic mass would recommend follow-up with primary care provider and a decision whether or not she should obtain a second opinion about removal of this. She also has bilateral lower extremity edema that have encouraged follow-up with primary care.  She states that she saw Dr. Kenton Kingfisher 2 weeks ago and they increased her Lasix but the bilateral edema has minimally improved.  All questions were answered today.  Follow-up in clinic in 6 months or as needed.  Greater than 50% of this patient's 32-minute office was been face-to-face discussing above recommendations treatment plan  Garner Nash, DO [facility] 01/01/2020 1:58 PM

## 2020-01-01 NOTE — Addendum Note (Signed)
Addended by: June Leap on: 01/01/2020 02:57 PM   Modules accepted: Orders

## 2020-01-01 NOTE — Telephone Encounter (Signed)
PCCM:  Yes its possible but no PFTs for review. Therefore, can not say for any certainty.  I am seeing her in the office today and will reiterate once again Thanks  Garner Nash, [facility] 01/01/2020 7:07 AM

## 2020-01-01 NOTE — Telephone Encounter (Signed)
Spoke with [place] and notified of response per Dr Valeta Harms

## 2020-01-01 NOTE — Patient Instructions (Addendum)
Thank you for visiting Dr. Valeta Harms at [facility]. Today we recommend the following:  Anoro elipta inhaler samples  New prescription and refills today  Continue albuterol as needed.   Return in about 6 months (around 06/30/2020).    Please do your part to reduce the spread of COVID-19.

## 2020-01-01 NOTE — Addendum Note (Signed)
Addended by: June Leap on: 01/01/2020 05:30 PM   Modules accepted: Orders

## 2020-05-25 ENCOUNTER — Telehealth: Payer: Self-pay | Admitting: Pulmonary Disease

## 2020-05-25 MED ORDER — ANORO ELLIPTA 62.5-25 MCG/INH IN AEPB: 1.0000 | INHALATION_SPRAY | Freq: Every day | RESPIRATORY_TRACT | 0 refills | Status: DC

## 2020-05-25 NOTE — Telephone Encounter (Signed)
Spoke with Daughter, Sheppard Plumber, designated party.  She states that the inhaler jammed and she missed a few doses of her inhaler and it is not time to refill it.  She is requesting samples to get her through.  Samples placed at the front for pick up.  Nothing further needed.

## 2020-07-09 ENCOUNTER — Ambulatory Visit: Payer: Medicare Other | Admitting: Adult Health

## 2020-07-09 ENCOUNTER — Other Ambulatory Visit: Payer: Self-pay

## 2020-07-09 ENCOUNTER — Ambulatory Visit (INDEPENDENT_AMBULATORY_CARE_PROVIDER_SITE_OTHER): Payer: Medicare Other

## 2020-07-09 ENCOUNTER — Encounter: Payer: Self-pay | Admitting: Adult Health

## 2020-07-09 VITALS — BP 140/70 | HR 60 | Wt 139.0 lb

## 2020-07-09 DIAGNOSIS — R0602 Shortness of breath: Secondary | ICD-10-CM | POA: Diagnosis not present

## 2020-07-09 DIAGNOSIS — J9611 Chronic respiratory failure with hypoxia: Secondary | ICD-10-CM | POA: Diagnosis not present

## 2020-07-09 DIAGNOSIS — J449 Chronic obstructive pulmonary disease, unspecified: Secondary | ICD-10-CM

## 2020-07-09 DIAGNOSIS — J441 Chronic obstructive pulmonary disease with (acute) exacerbation: Secondary | ICD-10-CM

## 2020-07-09 MED ORDER — PREDNISONE 20 MG PO TABS: 20.0000 mg | ORAL_TABLET | Freq: Every day | ORAL | 0 refills | Status: AC

## 2020-07-09 NOTE — Progress Notes (Signed)
@Patient  ID: Chloe Wilcox, female    DOB: 1935/05/03, 85 y.o.   MRN: [id number]  No chief complaint on file.   Referring provider: [name], MD  HPI:   TEST/EVENTS :   No Known Allergies   There is no immunization history on file for this patient.  Past Medical History:  Diagnosis Date  . Depression   . Hypertension     Tobacco History: Social History   Tobacco Use  Smoking Status Former Smoker  Smokeless Tobacco Never Used  Tobacco Comment   quit smoking 30 years ago   Counseling given: Not Answered Comment: quit smoking 30 years ago   Outpatient Medications Prior to Visit  Medication Sig Dispense Refill  . acetaminophen (TYLENOL) 650 MG CR tablet Take by mouth.    Marland Kitchen albuterol (VENTOLIN HFA) 108 (90 Base) MCG/ACT inhaler Inhale 2 puffs into the lungs every 6 (six) hours as needed for wheezing or shortness of breath. 18 g 2  . amLODipine (NORVASC) 10 MG tablet TAKE 1 TABLET BY MOUTH EVERY DAY    . aspirin 325 MG tablet Take 325 mg by mouth daily.    Marland Kitchen atorvastatin (LIPITOR) 40 MG tablet Take 40 mg by mouth at bedtime.    . benzonatate (TESSALON) 100 MG capsule Take 1 capsule (100 mg total) by mouth every 8 (eight) hours. (Patient taking differently: Take 100 mg by mouth 3 (three) times daily as needed for cough. ) 21 capsule 0  . citalopram (CELEXA) 20 MG tablet Take 20 mg by mouth daily.    . diclofenac (VOLTAREN) 75 MG EC tablet Take 75 mg by mouth 2 (two) times daily.    . Fe Fum-FePoly-Vit C-Vit B3 (INTEGRA PO) Take 1 capsule by mouth See admin instructions. Take 1 capsule by mouth on an empty stomach, an hour before first meal of the day    . furosemide (LASIX) 20 MG tablet Take 40 mg by mouth daily as needed.    . hydrochlorothiazide (HYDRODIURIL) 25 MG tablet Take 25 mg by mouth every other day.    . Multiple Vitamin (MULTIVITAMIN) tablet Take 1 tablet by mouth daily.    Marland Kitchen olmesartan (BENICAR) 20 MG tablet Take 20 mg by mouth daily.    Marland Kitchen  oxyCODONE-acetaminophen (PERCOCET/ROXICET) 5-325 MG tablet Take 1 tablet by mouth 2 (two) times daily as needed for moderate pain.   0  . tiZANidine (ZANAFLEX) 2 MG tablet 1 TABLET EVERY 8 HRS AS NEEDED FOR MUSCLE SPASM ORALLY 30 DAYS    . traMADol (ULTRAM) 50 MG tablet Take 50 mg by mouth daily as needed for moderate pain.    Marland Kitchen umeclidinium-vilanterol (ANORO ELLIPTA) 62.5-25 MCG/INH AEPB Inhale 1 puff into the lungs daily. 1 each 12  . umeclidinium-vilanterol (ANORO ELLIPTA) 62.5-25 MCG/INH AEPB Inhale 1 puff into the lungs daily. 2 each 0  . umeclidinium-vilanterol (ANORO ELLIPTA) 62.5-25 MCG/INH AEPB Inhale 1 puff into the lungs daily. 2 each 0  . fluticasone (FLOVENT HFA) 110 MCG/ACT inhaler Inhale 2 puffs into the lungs 2 (two) times daily. 1 Inhaler 2  . tiotropium (SPIRIVA HANDIHALER) 18 MCG inhalation capsule Place 1 capsule (18 mcg total) into inhaler and inhale daily. 30 capsule 2   No facility-administered medications prior to visit.     Review of Systems:   Constitutional:   No  weight loss, night sweats,  Fevers, chills, fatigue, or  lassitude.  HEENT:   No headaches,  Difficulty swallowing,  Tooth/dental problems, or  Sore throat,  No sneezing, itching, ear ache, nasal congestion, post nasal drip,   CV:  No chest pain,  Orthopnea, PND, swelling in lower extremities, anasarca, dizziness, palpitations, syncope.   GI  No heartburn, indigestion, abdominal pain, nausea, vomiting, diarrhea, change in bowel habits, loss of appetite, bloody stools.   Resp: No shortness of breath with exertion or at rest.  No excess mucus, no productive cough,  No non-productive cough,  No coughing up of blood.  No change in color of mucus.  No wheezing.  No chest wall deformity  Skin: no rash or lesions.  GU: no dysuria, change in color of urine, no urgency or frequency.  No flank pain, no hematuria   MS:  No joint pain or swelling.  No decreased range of motion.  No back  pain.    Physical Exam  BP 140/70 (BP Location: Left Arm, Cuff Size: Normal)   Pulse 60   Wt 139 lb (63 kg)   SpO2 92%   BMI 24.62 kg/m   GEN: A/Ox3; pleasant , NAD, well nourished    HEENT:  [place]/AT,  EACs-clear, TMs-wnl, NOSE-clear, THROAT-clear, no lesions, no postnasal drip or exudate noted.   NECK:  Supple w/ fair ROM; no JVD; normal carotid impulses w/o bruits; no thyromegaly or nodules palpated; no lymphadenopathy.    RESP  Clear  P & A; w/o, wheezes/ rales/ or rhonchi. no accessory muscle use, no dullness to percussion  CARD:  RRR, no m/r/g, no peripheral edema, pulses intact, no cyanosis or clubbing.  GI:   Soft & nt; nml bowel sounds; no organomegaly or masses detected.   Musco: Warm bil, no deformities or joint swelling noted.   Neuro: alert, no focal deficits noted.    Skin: Warm, no lesions or rashes    Lab Results:  CBC    Component Value Date/Time   WBC 7.6 06/16/2019 0540   RBC 4.33 06/16/2019 0540   HGB 11.9 (L) 06/16/2019 0540   HCT 40.7 06/16/2019 0540   PLT 328 06/16/2019 0540   MCV 94.0 06/16/2019 0540   MCH 27.5 06/16/2019 0540   MCHC 29.2 (L) 06/16/2019 0540   RDW 15.0 06/16/2019 0540   LYMPHSABS 0.8 06/15/2019 0638   MONOABS 0.6 06/15/2019 0638   EOSABS 0.2 06/15/2019 0638   BASOSABS 0.0 06/15/2019 0638    BMET    Component Value Date/Time   NA 140 06/16/2019 0540   K 3.8 06/16/2019 0540   CL 100 06/16/2019 0540   CO2 33 (H) 06/16/2019 0540   GLUCOSE 90 06/16/2019 0540   BUN 10 06/16/2019 0540   CREATININE 0.56 06/16/2019 0540   CALCIUM 9.5 06/16/2019 0540   GFRNONAA >60 06/16/2019 0540   GFRAA >60 06/16/2019 0540    BNP    Component Value Date/Time   BNP 177.0 (H) 06/15/2019 1221    ProBNP No results found for: PROBNP  Imaging: No results found.    No flowsheet data found.  No results found for: NITRICOXIDE      Assessment & Plan:   No problem-specific Assessment & Plan notes found for this  encounter.     Rexene Edison, NP 07/09/2020

## 2020-07-09 NOTE — Assessment & Plan Note (Signed)
Hx of O2 dependence  Check ONO .

## 2020-07-09 NOTE — Assessment & Plan Note (Signed)
Flare   Plan  Patient Instructions  Continue on Anoro 1 puff daily Chest x-ray today Prednisone 20 mg daily for 5 days take with food Albuterol inhaler as needed Set up for overnight oximetry test Follow up with Dr. Valeta Harms in 3 months and As needed   Please contact office for sooner follow up if symptoms do not improve or worsen or seek emergency care

## 2020-07-09 NOTE — Progress Notes (Signed)
@Patient  ID: Chloe Wilcox, female    DOB: November 10, 1935, 85 y.o.   MRN: [id number]  Chief Complaint  Patient presents with  . Follow-up    COPD     Referring provider: [name], MD  HPI:  @Patient  ID: Chloe Wilcox, female    DOB: [birth year], 85 y.o.   MRN: [id number]  Chief Complaint  Patient presents with  . Follow-up    COPD     Referring provider: [name], MD  HPI: 85 year old female former smoker seen for COPD (unable to complete PFTs)  TEST/EVENTS :   07/09/2020 Follow up : COPD  Patient presents for a 59-month follow-up.  Patient is accompanied by her granddaughter.  Patient says overall breathing was doing okay until about 1 to 2 weeks ago.  Started noticing that breathing was not doing as good with the hot temperatures.  Has had a little bit more dry cough and wheezing.  No discolored mucus fever chest pain orthopnea.  Patient previously been on oxygen but is not on oxygen any longer.  She remains on Anoro daily.  Uses albuterol on occasion.  No Known Allergies   There is no immunization history on file for this patient.  Past Medical History:  Diagnosis Date  . Depression   . Hypertension     Tobacco History: Social History   Tobacco Use  Smoking Status Former Smoker  Smokeless Tobacco Never Used  Tobacco Comment   quit smoking 30 years ago   Counseling given: Not Answered Comment: quit smoking 30 years ago   Outpatient Medications Prior to Visit  Medication Sig Dispense Refill  . acetaminophen (TYLENOL) 650 MG CR tablet Take by mouth.    Marland Kitchen albuterol (VENTOLIN HFA) 108 (90 Base) MCG/ACT inhaler Inhale 2 puffs into the lungs every 6 (six) hours as needed for wheezing or shortness of breath. 18 g 2  . amLODipine (NORVASC) 10 MG tablet TAKE 1 TABLET BY MOUTH EVERY DAY    . aspirin 325 MG tablet Take 325 mg by mouth daily.    Marland Kitchen atorvastatin (LIPITOR) 40 MG tablet Take 40 mg by mouth at bedtime.    . benzonatate (TESSALON) 100 MG  capsule Take 1 capsule (100 mg total) by mouth every 8 (eight) hours. (Patient taking differently: Take 100 mg by mouth 3 (three) times daily as needed for cough. ) 21 capsule 0  . citalopram (CELEXA) 20 MG tablet Take 20 mg by mouth daily.    . diclofenac (VOLTAREN) 75 MG EC tablet Take 75 mg by mouth 2 (two) times daily.    . Fe Fum-FePoly-Vit C-Vit B3 (INTEGRA PO) Take 1 capsule by mouth See admin instructions. Take 1 capsule by mouth on an empty stomach, an hour before first meal of the day    . furosemide (LASIX) 20 MG tablet Take 40 mg by mouth daily as needed.    . hydrochlorothiazide (HYDRODIURIL) 25 MG tablet Take 25 mg by mouth every other day.    . Multiple Vitamin (MULTIVITAMIN) tablet Take 1 tablet by mouth daily.    Marland Kitchen olmesartan (BENICAR) 20 MG tablet Take 20 mg by mouth daily.    Marland Kitchen oxyCODONE-acetaminophen (PERCOCET/ROXICET) 5-325 MG tablet Take 1 tablet by mouth 2 (two) times daily as needed for moderate pain.   0  . tiZANidine (ZANAFLEX) 2 MG tablet 1 TABLET EVERY 8 HRS AS NEEDED FOR MUSCLE SPASM ORALLY 30 DAYS    . traMADol (ULTRAM) 50 MG tablet Take 50 mg by  mouth daily as needed for moderate pain.    Marland Kitchen umeclidinium-vilanterol (ANORO ELLIPTA) 62.5-25 MCG/INH AEPB Inhale 1 puff into the lungs daily. 1 each 12  . umeclidinium-vilanterol (ANORO ELLIPTA) 62.5-25 MCG/INH AEPB Inhale 1 puff into the lungs daily. 2 each 0  . umeclidinium-vilanterol (ANORO ELLIPTA) 62.5-25 MCG/INH AEPB Inhale 1 puff into the lungs daily. 2 each 0  . fluticasone (FLOVENT HFA) 110 MCG/ACT inhaler Inhale 2 puffs into the lungs 2 (two) times daily. 1 Inhaler 2  . tiotropium (SPIRIVA HANDIHALER) 18 MCG inhalation capsule Place 1 capsule (18 mcg total) into inhaler and inhale daily. 30 capsule 2   No facility-administered medications prior to visit.     Review of Systems:   Constitutional:   No  weight loss, night sweats,  Fevers, chills,  +fatigue, or  lassitude.  HEENT:   No headaches,  Difficulty  swallowing,  Tooth/dental problems, or  Sore throat,                No sneezing, itching, ear ache, nasal congestion, post nasal drip,   CV:  No chest pain,  Orthopnea, PND, swelling in lower extremities, anasarca, dizziness, palpitations, syncope.   GI  No heartburn, indigestion, abdominal pain, nausea, vomiting, diarrhea, change in bowel habits, loss of appetite, bloody stools.   Resp:  .  No chest wall deformity  Skin: no rash or lesions.  GU: no dysuria, change in color of urine, no urgency or frequency.  No flank pain, no hematuria   MS:  No joint pain or swelling.  No decreased range of motion.  No back pain.    Physical Exam  BP 140/70 (BP Location: Left Arm, Cuff Size: Normal)   Pulse 60   Wt 139 lb (63 kg)   SpO2 92%   BMI 24.62 kg/m   GEN: A/Ox3; pleasant , NAD,     HEENT:  [place]/AT,    NOSE-clear, THROAT-clear, no lesions, no postnasal drip or exudate noted.   NECK:  Supple w/ fair ROM; no JVD; normal carotid impulses w/o bruits; no thyromegaly or nodules palpated; no lymphadenopathy.    RESP  Clear  P & A; w/o, wheezes/ rales/ or rhonchi. no accessory muscle use, no dullness to percussion  CARD:  RRR, no m/r/g, no peripheral edema, pulses intact, no cyanosis or clubbing.  GI:   Soft & nt; nml bowel sounds; no organomegaly or masses detected.   Musco: Warm bil, no deformities or joint swelling noted.   Neuro: alert, no focal deficits noted.    Skin: Warm, no lesions or rashes    Lab Results:      ProBNP No results found for: PROBNP  Imaging: No results found.    No flowsheet data found.  No results found for: NITRICOXIDE      Assessment & Plan:   COPD exacerbation ([place]) Flare   Plan  Patient Instructions  Continue on Anoro 1 puff daily Chest x-ray today Prednisone 20 mg daily for 5 days take with food Albuterol inhaler as needed Set up for overnight oximetry test Follow up with Dr. Valeta Harms in 3 months and As needed   Please  contact office for sooner follow up if symptoms do not improve or worsen or seek emergency care       Respiratory failure ([place]) Hx of O2 dependence  Check ONO .      Rexene Edison, NP 07/09/2020   TEST/EVENTS :   No Known Allergies   There is no immunization history on  file for this patient.  Past Medical History:  Diagnosis Date  . Depression   . Hypertension     Tobacco History: Social History   Tobacco Use  Smoking Status Former Smoker  Smokeless Tobacco Never Used  Tobacco Comment   quit smoking 30 years ago   Counseling given: Not Answered Comment: quit smoking 30 years ago   Outpatient Medications Prior to Visit  Medication Sig Dispense Refill  . acetaminophen (TYLENOL) 650 MG CR tablet Take by mouth.    Marland Kitchen albuterol (VENTOLIN HFA) 108 (90 Base) MCG/ACT inhaler Inhale 2 puffs into the lungs every 6 (six) hours as needed for wheezing or shortness of breath. 18 g 2  . amLODipine (NORVASC) 10 MG tablet TAKE 1 TABLET BY MOUTH EVERY DAY    . aspirin 325 MG tablet Take 325 mg by mouth daily.    Marland Kitchen atorvastatin (LIPITOR) 40 MG tablet Take 40 mg by mouth at bedtime.    . benzonatate (TESSALON) 100 MG capsule Take 1 capsule (100 mg total) by mouth every 8 (eight) hours. (Patient taking differently: Take 100 mg by mouth 3 (three) times daily as needed for cough. ) 21 capsule 0  . citalopram (CELEXA) 20 MG tablet Take 20 mg by mouth daily.    . diclofenac (VOLTAREN) 75 MG EC tablet Take 75 mg by mouth 2 (two) times daily.    . Fe Fum-FePoly-Vit C-Vit B3 (INTEGRA PO) Take 1 capsule by mouth See admin instructions. Take 1 capsule by mouth on an empty stomach, an hour before first meal of the day    . furosemide (LASIX) 20 MG tablet Take 40 mg by mouth daily as needed.    . hydrochlorothiazide (HYDRODIURIL) 25 MG tablet Take 25 mg by mouth every other day.    . Multiple Vitamin (MULTIVITAMIN) tablet Take 1 tablet by mouth daily.    Marland Kitchen olmesartan (BENICAR) 20 MG tablet Take  20 mg by mouth daily.    Marland Kitchen oxyCODONE-acetaminophen (PERCOCET/ROXICET) 5-325 MG tablet Take 1 tablet by mouth 2 (two) times daily as needed for moderate pain.   0  . tiZANidine (ZANAFLEX) 2 MG tablet 1 TABLET EVERY 8 HRS AS NEEDED FOR MUSCLE SPASM ORALLY 30 DAYS    . traMADol (ULTRAM) 50 MG tablet Take 50 mg by mouth daily as needed for moderate pain.    Marland Kitchen umeclidinium-vilanterol (ANORO ELLIPTA) 62.5-25 MCG/INH AEPB Inhale 1 puff into the lungs daily. 1 each 12  . umeclidinium-vilanterol (ANORO ELLIPTA) 62.5-25 MCG/INH AEPB Inhale 1 puff into the lungs daily. 2 each 0  . umeclidinium-vilanterol (ANORO ELLIPTA) 62.5-25 MCG/INH AEPB Inhale 1 puff into the lungs daily. 2 each 0  . fluticasone (FLOVENT HFA) 110 MCG/ACT inhaler Inhale 2 puffs into the lungs 2 (two) times daily. 1 Inhaler 2  . tiotropium (SPIRIVA HANDIHALER) 18 MCG inhalation capsule Place 1 capsule (18 mcg total) into inhaler and inhale daily. 30 capsule 2   No facility-administered medications prior to visit.     Review of Systems:   Constitutional:   No  weight loss, night sweats,  Fevers, chills, fatigue, or  lassitude.  HEENT:   No headaches,  Difficulty swallowing,  Tooth/dental problems, or  Sore throat,                No sneezing, itching, ear ache, nasal congestion, post nasal drip,   CV:  No chest pain,  Orthopnea, PND, swelling in lower extremities, anasarca, dizziness, palpitations, syncope.   GI  No heartburn, indigestion, abdominal  pain, nausea, vomiting, diarrhea, change in bowel habits, loss of appetite, bloody stools.   Resp: No shortness of breath with exertion or at rest.  No excess mucus, no productive cough,  No non-productive cough,  No coughing up of blood.  No change in color of mucus.  No wheezing.  No chest wall deformity  Skin: no rash or lesions.  GU: no dysuria, change in color of urine, no urgency or frequency.  No flank pain, no hematuria   MS:  No joint pain or swelling.  No decreased range of  motion.  No back pain.    Physical Exam  BP 140/70 (BP Location: Left Arm, Cuff Size: Normal)   Pulse 60   Wt 139 lb (63 kg)   SpO2 92%   BMI 24.62 kg/m   GEN: A/Ox3; pleasant , NAD, well nourished    HEENT:  [place]/AT,  EACs-clear, TMs-wnl, NOSE-clear, THROAT-clear, no lesions, no postnasal drip or exudate noted.   NECK:  Supple w/ fair ROM; no JVD; normal carotid impulses w/o bruits; no thyromegaly or nodules palpated; no lymphadenopathy.    RESP  Clear  P & A; w/o, wheezes/ rales/ or rhonchi. no accessory muscle use, no dullness to percussion  CARD:  RRR, no m/r/g, no peripheral edema, pulses intact, no cyanosis or clubbing.  GI:   Soft & nt; nml bowel sounds; no organomegaly or masses detected.   Musco: Warm bil, no deformities or joint swelling noted.   Neuro: alert, no focal deficits noted.    Skin: Warm, no lesions or rashes    Lab Results:   BMET  ProBNP No results found for: PROBNP  Imaging: No results found.    No flowsheet data found.  No results found for: NITRICOXIDE      Assessment & Plan:   COPD exacerbation ([place]) Flare   Plan  Patient Instructions  Continue on Anoro 1 puff daily Chest x-ray today Prednisone 20 mg daily for 5 days take with food Albuterol inhaler as needed Set up for overnight oximetry test Follow up with Dr. Valeta Harms in 3 months and As needed   Please contact office for sooner follow up if symptoms do not improve or worsen or seek emergency care       Respiratory failure ([place]) Hx of O2 dependence  Check ONO .      Rexene Edison, NP 07/09/2020

## 2020-07-09 NOTE — Patient Instructions (Addendum)
Continue on Anoro 1 puff daily Chest x-ray today Prednisone 20 mg daily for 5 days take with food Albuterol inhaler as needed Set up for overnight oximetry test Follow up with Dr. Valeta Harms in 3 months and As needed   Please contact office for sooner follow up if symptoms do not improve or worsen or seek emergency care

## 2020-07-20 ENCOUNTER — Telehealth: Payer: Self-pay | Admitting: Adult Health

## 2020-07-20 NOTE — Telephone Encounter (Signed)
Called and spoke with daughter [name] and she states that her mother is still having the shortness of breath and they are wondering if they can get an extended does of Prednisone. Patient was given RX on 07/09/20 at [place]. Denies cough, fever   Tammy please advise

## 2020-07-21 NOTE — Telephone Encounter (Signed)
Please see if she can come in , I think there are some openings on the schedule later this week May need labs  Please contact office for sooner follow up if symptoms do not improve or worsen or seek emergency care

## 2020-07-21 NOTE — Telephone Encounter (Signed)
Left detailed message on patient's machine to call and make appointment for later this week .   Routing to front desk pool incase patient calls back

## 2020-07-23 ENCOUNTER — Ambulatory Visit: Payer: Medicare Other | Admitting: Adult Health

## 2020-07-23 ENCOUNTER — Other Ambulatory Visit: Payer: Self-pay

## 2020-07-23 ENCOUNTER — Encounter: Payer: Self-pay | Admitting: Adult Health

## 2020-07-23 ENCOUNTER — Telehealth: Payer: Self-pay | Admitting: Adult Health

## 2020-07-23 ENCOUNTER — Emergency Department (HOSPITAL_COMMUNITY): Payer: Medicare Other

## 2020-07-23 ENCOUNTER — Inpatient Hospital Stay (HOSPITAL_COMMUNITY)
Admission: EM | Admit: 2020-07-23 | Discharge: 2020-07-27 | DRG: 190 | Disposition: A | Discharge status: Home / Self Care | Payer: Medicare Other | Source: Ambulatory Visit | Attending: Internal Medicine | Admitting: Internal Medicine

## 2020-07-23 ENCOUNTER — Encounter (HOSPITAL_COMMUNITY): Payer: Self-pay | Admitting: Student

## 2020-07-23 DIAGNOSIS — J449 Chronic obstructive pulmonary disease, unspecified: Secondary | ICD-10-CM | POA: Diagnosis present

## 2020-07-23 DIAGNOSIS — Z823 Family history of stroke: Secondary | ICD-10-CM

## 2020-07-23 DIAGNOSIS — Z79899 Other long term (current) drug therapy: Secondary | ICD-10-CM

## 2020-07-23 DIAGNOSIS — J441 Chronic obstructive pulmonary disease with (acute) exacerbation: Secondary | ICD-10-CM | POA: Diagnosis not present

## 2020-07-23 DIAGNOSIS — J9601 Acute respiratory failure with hypoxia: Secondary | ICD-10-CM

## 2020-07-23 DIAGNOSIS — R0902 Hypoxemia: Secondary | ICD-10-CM | POA: Diagnosis not present

## 2020-07-23 DIAGNOSIS — Z8249 Family history of ischemic heart disease and other diseases of the circulatory system: Secondary | ICD-10-CM

## 2020-07-23 DIAGNOSIS — Z87891 Personal history of nicotine dependence: Secondary | ICD-10-CM

## 2020-07-23 DIAGNOSIS — J440 Chronic obstructive pulmonary disease with acute lower respiratory infection: Secondary | ICD-10-CM

## 2020-07-23 DIAGNOSIS — J9621 Acute and chronic respiratory failure with hypoxia: Secondary | ICD-10-CM | POA: Diagnosis present

## 2020-07-23 DIAGNOSIS — J209 Acute bronchitis, unspecified: Secondary | ICD-10-CM

## 2020-07-23 DIAGNOSIS — E876 Hypokalemia: Secondary | ICD-10-CM

## 2020-07-23 DIAGNOSIS — Z90710 Acquired absence of both cervix and uterus: Secondary | ICD-10-CM

## 2020-07-23 DIAGNOSIS — Z20822 Contact with and (suspected) exposure to covid-19: Secondary | ICD-10-CM | POA: Diagnosis present

## 2020-07-23 DIAGNOSIS — F329 Major depressive disorder, single episode, unspecified: Secondary | ICD-10-CM | POA: Diagnosis present

## 2020-07-23 DIAGNOSIS — J9611 Chronic respiratory failure with hypoxia: Secondary | ICD-10-CM

## 2020-07-23 DIAGNOSIS — Z7982 Long term (current) use of aspirin: Secondary | ICD-10-CM

## 2020-07-23 DIAGNOSIS — I10 Essential (primary) hypertension: Secondary | ICD-10-CM | POA: Diagnosis present

## 2020-07-23 LAB — SARS CORONAVIRUS 2 BY RT PCR (HOSPITAL ORDER, PERFORMED IN ~~LOC~~ HOSPITAL LAB): SARS Coronavirus 2: NEGATIVE

## 2020-07-23 LAB — CBC WITH DIFFERENTIAL/PLATELET
Abs Immature Granulocytes: 0.02 10*3/uL (ref 0.00–0.07)
Basophils Absolute: 0.1 10*3/uL (ref 0.0–0.1)
Basophils Relative: 1 %
Eosinophils Absolute: 0.8 10*3/uL — ABNORMAL HIGH (ref 0.0–0.5)
Eosinophils Relative: 11 %
HCT: 40.9 % (ref 36.0–46.0)
Hemoglobin: 11.8 g/dL — ABNORMAL LOW (ref 12.0–15.0)
Immature Granulocytes: 0 %
Lymphocytes Relative: 10 %
Lymphs Abs: 0.7 10*3/uL (ref 0.7–4.0)
MCH: 26 pg (ref 26.0–34.0)
MCHC: 28.9 g/dL — ABNORMAL LOW (ref 30.0–36.0)
MCV: 90.3 fL (ref 80.0–100.0)
Monocytes Absolute: 0.7 10*3/uL (ref 0.1–1.0)
Monocytes Relative: 8 %
Neutro Abs: 5.4 10*3/uL (ref 1.7–7.7)
Neutrophils Relative %: 70 %
Platelets: 341 10*3/uL (ref 150–400)
RBC: 4.53 MIL/uL (ref 3.87–5.11)
RDW: 14.7 % (ref 11.5–15.5)
WBC: 7.7 10*3/uL (ref 4.0–10.5)
nRBC: 0 % (ref 0.0–0.2)

## 2020-07-23 LAB — I-STAT VENOUS BLOOD GAS, ED
Acid-Base Excess: 4 mmol/L — ABNORMAL HIGH (ref 0.0–2.0)
Bicarbonate: 31.8 mmol/L — ABNORMAL HIGH (ref 20.0–28.0)
Calcium, Ion: 1.14 mmol/L — ABNORMAL LOW (ref 1.15–1.40)
HCT: 40 % (ref 36.0–46.0)
Hemoglobin: 13.6 g/dL (ref 12.0–15.0)
O2 Saturation: 79 %
Potassium: 3.7 mmol/L (ref 3.5–5.1)
Sodium: 142 mmol/L (ref 135–145)
TCO2: 34 mmol/L — ABNORMAL HIGH (ref 22–32)
pCO2, Ven: 58.3 mmHg (ref 44.0–60.0)
pH, Ven: 7.345 (ref 7.250–7.430)
pO2, Ven: 47 mmHg — ABNORMAL HIGH (ref 32.0–45.0)

## 2020-07-23 LAB — COMPREHENSIVE METABOLIC PANEL
ALT: 13 U/L (ref 0–44)
AST: 21 U/L (ref 15–41)
Albumin: 4.3 g/dL (ref 3.5–5.0)
Alkaline Phosphatase: 84 U/L (ref 38–126)
Anion gap: 9 (ref 5–15)
BUN: 13 mg/dL (ref 8–23)
CO2: 28 mmol/L (ref 22–32)
Calcium: 9.2 mg/dL (ref 8.9–10.3)
Chloride: 104 mmol/L (ref 98–111)
Creatinine, Ser: 0.67 mg/dL (ref 0.44–1.00)
GFR calc Af Amer: >60 mL/min (ref >60)
GFR calc non Af Amer: >60 mL/min (ref >60)
Glucose, Bld: 107 mg/dL — ABNORMAL HIGH (ref 70–99)
Potassium: 4.1 mmol/L (ref 3.5–5.1)
Sodium: 141 mmol/L (ref 135–145)
Total Bilirubin: 0.6 mg/dL (ref 0.3–1.2)
Total Protein: 6.9 g/dL (ref 6.5–8.1)

## 2020-07-23 LAB — BRAIN NATRIURETIC PEPTIDE: B Natriuretic Peptide: 78.8 pg/mL (ref 0.0–100.0)

## 2020-07-23 LAB — TROPONIN I (HIGH SENSITIVITY): Troponin I (High Sensitivity): 9 ng/L (ref <18)

## 2020-07-23 MED ORDER — AZITHROMYCIN 250 MG PO TABS
500.0000 mg | ORAL_TABLET | Freq: Every day | ORAL | Status: DC
  Administered 2020-07-24 – 2020-07-27 (×4): 500 mg via ORAL
  Filled 2020-07-23 (×4): qty 2

## 2020-07-23 MED ORDER — AEROCHAMBER PLUS FLO-VU LARGE MISC
Status: AC
  Administered 2020-07-23: 1
  Filled 2020-07-23: qty 1

## 2020-07-23 MED ORDER — METHYLPREDNISOLONE SODIUM SUCC 125 MG IJ SOLR
125.0000 mg | Freq: Once | INTRAMUSCULAR | Status: AC
  Administered 2020-07-23: 125 mg via INTRAVENOUS
  Filled 2020-07-23: qty 2

## 2020-07-23 MED ORDER — IPRATROPIUM-ALBUTEROL 0.5-2.5 (3) MG/3ML IN SOLN
3.0000 mL | RESPIRATORY_TRACT | Status: AC
  Administered 2020-07-23 (×3): 3 mL via RESPIRATORY_TRACT
  Filled 2020-07-23 (×3): qty 3

## 2020-07-23 MED ORDER — IPRATROPIUM BROMIDE HFA 17 MCG/ACT IN AERS
2.0000 | INHALATION_SPRAY | Freq: Once | RESPIRATORY_TRACT | Status: AC
  Administered 2020-07-23: 2 via RESPIRATORY_TRACT
  Filled 2020-07-23: qty 12.9

## 2020-07-23 MED ORDER — SODIUM CHLORIDE 0.9 % IV SOLN
1.0000 g | Freq: Once | INTRAVENOUS | Status: AC
  Administered 2020-07-23: 1 g via INTRAVENOUS
  Filled 2020-07-23: qty 10

## 2020-07-23 MED ORDER — SODIUM CHLORIDE 0.9 % IV SOLN
500.0000 mg | Freq: Once | INTRAVENOUS | Status: AC
  Administered 2020-07-23: 500 mg via INTRAVENOUS
  Filled 2020-07-23: qty 500

## 2020-07-23 MED ORDER — IPRATROPIUM-ALBUTEROL 0.5-2.5 (3) MG/3ML IN SOLN
3.0000 mL | Freq: Four times a day (QID) | RESPIRATORY_TRACT | Status: DC
  Administered 2020-07-23 – 2020-07-25 (×9): 3 mL via RESPIRATORY_TRACT
  Filled 2020-07-23 (×8): qty 3

## 2020-07-23 MED ORDER — UMECLIDINIUM-VILANTEROL 62.5-25 MCG/INH IN AEPB
1.0000 | INHALATION_SPRAY | Freq: Every day | RESPIRATORY_TRACT | Status: DC
  Administered 2020-07-25 – 2020-07-27 (×3): 1 via RESPIRATORY_TRACT
  Filled 2020-07-23 (×2): qty 14

## 2020-07-23 MED ORDER — ALBUTEROL SULFATE HFA 108 (90 BASE) MCG/ACT IN AERS
8.0000 | INHALATION_SPRAY | Freq: Once | RESPIRATORY_TRACT | Status: AC
  Administered 2020-07-23: 8 via RESPIRATORY_TRACT
  Filled 2020-07-23: qty 6.7

## 2020-07-23 MED ORDER — AEROCHAMBER PLUS FLO-VU LARGE MISC: 1.0000 | Freq: Once | Status: AC

## 2020-07-23 MED ORDER — METHYLPREDNISOLONE SODIUM SUCC 125 MG IJ SOLR
60.0000 mg | Freq: Three times a day (TID) | INTRAMUSCULAR | Status: DC
  Administered 2020-07-24 – 2020-07-25 (×6): 60 mg via INTRAVENOUS
  Filled 2020-07-23 (×6): qty 2

## 2020-07-23 NOTE — Assessment & Plan Note (Signed)
Secondary to COPD flare  Needs chest xray  O2 to keep sats >90%.

## 2020-07-23 NOTE — Progress Notes (Signed)
@Patient  ID: Chloe Wilcox, female    DOB: July 20, 1935, 85 y.o.   MRN: [id number]  Chief Complaint  Patient presents with   Follow-up    COPD     Referring provider: [name], MD  HPI: 85 yo female former smoker followed for presumed COPD (unable to complete PFTs)   TEST/EVENTS :   07/23/2020 Acute OV : COPD  Patient presents for an acute office visit complains of 1 week of cough, wheezing and shortness of breath . Seen in office 2 weeks ago with COPD flare, treated with prednisone 20mg  daily for 5 days . Felt better until this week , now with increased wheezing and cough . Severe dyspnea last 3 days , dyspnea with minimal activity . Chest xray last visit wit vascular congestion no acute process. Remains on ANORO daily .  On arrival to office today very short of breath . O2 sats 78% with diffuse wheezing on exam. Does not have oxygen at home.  Has noticed swelling in legs. Covid vaccine utd. No fever or loss of taste or smell.  Took albuterol neb with not much help.  Eating well with no n/v/d.     No Known Allergies   There is no immunization history on file for this patient.  Past Medical History:  Diagnosis Date   Depression    Hypertension     Tobacco History: Social History   Tobacco Use  Smoking Status Former Smoker  Smokeless Tobacco Never Used  Tobacco Comment   quit smoking 30 years ago   Counseling given: Not Answered Comment: quit smoking 30 years ago   Outpatient Medications Prior to Visit  Medication Sig Dispense Refill   acetaminophen (TYLENOL) 650 MG CR tablet Take by mouth.     albuterol (VENTOLIN HFA) 108 (90 Base) MCG/ACT inhaler Inhale 2 puffs into the lungs every 6 (six) hours as needed for wheezing or shortness of breath. 18 g 2   amLODipine (NORVASC) 10 MG tablet TAKE 1 TABLET BY MOUTH EVERY DAY     aspirin 325 MG tablet Take 325 mg by mouth daily.     atorvastatin (LIPITOR) 40 MG tablet Take 40 mg by mouth at bedtime.      benzonatate (TESSALON) 100 MG capsule Take 1 capsule (100 mg total) by mouth every 8 (eight) hours. (Patient taking differently: Take 100 mg by mouth 3 (three) times daily as needed for cough. ) 21 capsule 0   citalopram (CELEXA) 20 MG tablet Take 20 mg by mouth daily.     diclofenac (VOLTAREN) 75 MG EC tablet Take 75 mg by mouth 2 (two) times daily.     Fe Fum-FePoly-Vit C-Vit B3 (INTEGRA PO) Take 1 capsule by mouth See admin instructions. Take 1 capsule by mouth on an empty stomach, an hour before first meal of the day     furosemide (LASIX) 20 MG tablet Take 40 mg by mouth daily as needed.     hydrochlorothiazide (HYDRODIURIL) 25 MG tablet Take 25 mg by mouth every other day.     Multiple Vitamin (MULTIVITAMIN) tablet Take 1 tablet by mouth daily.     olmesartan (BENICAR) 20 MG tablet Take 20 mg by mouth daily.     oxyCODONE-acetaminophen (PERCOCET/ROXICET) 5-325 MG tablet Take 1 tablet by mouth 2 (two) times daily as needed for moderate pain.   0   tiZANidine (ZANAFLEX) 2 MG tablet 1 TABLET EVERY 8 HRS AS NEEDED FOR MUSCLE SPASM ORALLY 30 DAYS  traMADol (ULTRAM) 50 MG tablet Take 50 mg by mouth daily as needed for moderate pain.     umeclidinium-vilanterol (ANORO ELLIPTA) 62.5-25 MCG/INH AEPB Inhale 1 puff into the lungs daily. 1 each 12   fluticasone (FLOVENT HFA) 110 MCG/ACT inhaler Inhale 2 puffs into the lungs 2 (two) times daily. 1 Inhaler 2   No facility-administered medications prior to visit.     Review of Systems:   Constitutional:   No  weight loss, night sweats,  Fevers, chills,  +fatigue, or  lassitude.  HEENT:   No headaches,  Difficulty swallowing,  Tooth/dental problems, or  Sore throat,                No sneezing, itching, ear ache, nasal congestion, post nasal drip,   CV:  No chest pain,  Orthopnea, PND, ++swelling in lower extremities, anasarca, dizziness, palpitations, syncope.   GI  No heartburn, indigestion, abdominal pain, nausea, vomiting,  diarrhea, change in bowel habits, loss of appetite, bloody stools.   Resp:    No chest wall deformity  Skin: no rash or lesions.  GU: no dysuria, change in color of urine, no urgency or frequency.  No flank pain, no hematuria   MS:  No joint pain or swelling.  No decreased range of motion.  No back pain.    Physical Exam  BP 140/66 (BP Location: Left Arm, Cuff Size: Normal)    Pulse 86    Temp (!) 96.7 F (35.9 C) (Temporal)    Ht 5\' 3"  (1.6 m)    Wt 136 lb 3.2 oz (61.8 kg)    SpO2 (!) 78% Comment: 78% when arrived to room, placed on 2L, up to 97%   BMI 24.13 kg/m   GEN: A/Ox3; pleasant , NAD, elderly , cane,    HEENT:  [place]/AT,   , NOSE-clear, THROAT-clear, no lesions, no postnasal drip or exudate noted.   NECK:  Supple w/ fair ROM; no JVD; normal carotid impulses w/o bruits; no thyromegaly or nodules palpated; no lymphadenopathy.    RESP  Diffuse wheezing  no accessory muscle use, no dullness to percussion  CARD:  RRR, no m/r/g, 1+ peripheral edema, pulses intact, no cyanosis or clubbing.  GI:   Soft & nt; nml bowel sounds; no organomegaly or masses detected.   Musco: Warm bil, no deformities or joint swelling noted.   Neuro: alert, no focal deficits noted.    Skin: Warm, no lesions or rashes    Lab Results:    ProBNP No results found for: PROBNP  Imaging: DG Chest 2 View  Result Date: 07/10/2020 CLINICAL DATA:  Short of breath EXAM: CHEST - 2 VIEW COMPARISON:  06/14/2019 FINDINGS: Frontal and lateral views of the chest demonstrate a stable cardiac silhouette. There is increased vascular congestion since prior study, without overt edema. No effusion or pneumothorax. No acute bony abnormalities. IMPRESSION: 1. Vascular congestion without overt edema. Electronically Signed   By: Randa Ngo M.D.   On: 07/10/2020 09:32      No flowsheet data found.  No results found for: NITRICOXIDE      Assessment & Plan:   COPD exacerbation ([place]) Acute exacerbation  with associated hypoxemia -will need further evaluation with emergency room care and probable hospital admission   Plan  Patient Instructions  EMS to transport to ER .       Acute respiratory failure with hypoxia ([facility]) Secondary to COPD flare  Needs chest xray  O2 to keep sats >90%.  Rexene Edison, NP 07/23/2020

## 2020-07-23 NOTE — ED Provider Notes (Signed)
[facility] EMERGENCY DEPARTMENT Provider Note   CSN: [id number] Arrival date & time: 07/23/20  1336     History Chief Complaint  Patient presents with  . Shortness of Breath    Chloe Wilcox is a 85 y.o. female with a history of hypertension, COPD, and depression who presents to the emergency department from her pulmonologist office for evaluation of dyspnea over the past 2 weeks.  Patient states she has been feeling poorly for 2 weeks with shortness of breath, wheezing, and a minimal dry cough.  She was seen by her pulmonologist and given 20 mg of prednisone daily for 5 days which seemed to help some, however then her symptoms seem to return.  Her dyspnea over the past 3 days has been much worse especially with activity and with the supine position.  She reports intermittent lower extremity swelling for a while now, legs are currently swollen.  She was seen today at her pulmonologist office, per their note reviewed for additional history she desaturated to 78% on room air with ambulation and was sent to the emergency department for further evaluation and management.  She does not wear oxygen at home.  She denies fever, chills, chest pain, vomiting, abdominal pain, or syncope.  She denies hemoptysis, unilateral leg pain, recent travel, recent surgery, recent hospitalization, hormone use, or prior VTE/cancer.  HPI     Past Medical History:  Diagnosis Date  . Depression   . Hypertension     Patient Active Problem List   Diagnosis Date Noted  . Respiratory failure ([place]) 06/15/2019  . Acute respiratory failure with hypoxia ([place]) 06/14/2019  . COPD exacerbation ([place]) 06/14/2019  . COPD with acute bronchitis ([place]) 06/14/2019  . Hypokalemia 06/14/2019  . Essential hypertension 06/14/2019    Past Surgical History:  Procedure Laterality Date  . VAGINAL HYSTERECTOMY       OB History   No obstetric history on file.     Family History  Family history unknown:  Yes    Social History   Tobacco Use  . Smoking status: Former Research scientist (life sciences)  . Smokeless tobacco: Never Used  . Tobacco comment: quit smoking 30 years ago  Substance Use Topics  . Alcohol use: No  . Drug use: No    Home Medications Prior to Admission medications   Medication Sig Start Date End Date Taking? Authorizing Provider  acetaminophen (TYLENOL) 650 MG CR tablet Take by mouth.    [provider]  albuterol (VENTOLIN HFA) 108 (90 Base) MCG/ACT inhaler Inhale 2 puffs into the lungs every 6 (six) hours as needed for wheezing or shortness of breath. 01/01/20   Icard, Leory Plowman L, DO  amLODipine (NORVASC) 10 MG tablet TAKE 1 TABLET BY MOUTH EVERY DAY 05/21/19   [provider]  aspirin 325 MG tablet Take 325 mg by mouth daily.    [provider]  atorvastatin (LIPITOR) 40 MG tablet Take 40 mg by mouth at bedtime.    [provider]  benzonatate (TESSALON) 100 MG capsule Take 1 capsule (100 mg total) by mouth every 8 (eight) hours. Patient taking differently: Take 100 mg by mouth 3 (three) times daily as needed for cough.  01/07/17   Larene Pickett, PA-C  citalopram (CELEXA) 20 MG tablet Take 20 mg by mouth daily.    [provider]  diclofenac (VOLTAREN) 75 MG EC tablet Take 75 mg by mouth 2 (two) times daily.    [provider]  Fe Fum-FePoly-Vit C-Vit B3 (  INTEGRA PO) Take 1 capsule by mouth See admin instructions. Take 1 capsule by mouth on an empty stomach, an hour before first meal of the day    [provider]  fluticasone (FLOVENT HFA) 110 MCG/ACT inhaler Inhale 2 puffs into the lungs 2 (two) times daily. 06/16/19 06/15/20  Pokhrel, Corrie Mckusick, MD  furosemide (LASIX) 20 MG tablet Take 40 mg by mouth daily as needed. 11/25/19   [provider]  hydrochlorothiazide (HYDRODIURIL) 25 MG tablet Take 25 mg by mouth every other day.    [provider]  Multiple Vitamin (MULTIVITAMIN) tablet Take 1 tablet by mouth daily.     [provider]  olmesartan (BENICAR) 20 MG tablet Take 20 mg by mouth daily.    [provider]  oxyCODONE-acetaminophen (PERCOCET/ROXICET) 5-325 MG tablet Take 1 tablet by mouth 2 (two) times daily as needed for moderate pain.  01/02/17   [provider]  tiZANidine (ZANAFLEX) 2 MG tablet 1 TABLET EVERY 8 HRS AS NEEDED FOR MUSCLE SPASM ORALLY 30 DAYS 12/02/19   [provider]  traMADol (ULTRAM) 50 MG tablet Take 50 mg by mouth daily as needed for moderate pain.    [provider]  umeclidinium-vilanterol (ANORO ELLIPTA) 62.5-25 MCG/INH AEPB Inhale 1 puff into the lungs daily. 01/01/20   Garner Nash, DO    Allergies    Patient has no known allergies.  Review of Systems   Review of Systems  Constitutional: Negative for chills and fever.  Respiratory: Positive for cough, shortness of breath and wheezing.   Cardiovascular: Positive for leg swelling. Negative for chest pain.  Gastrointestinal: Negative for abdominal pain, blood in stool, constipation, nausea and vomiting.  Genitourinary: Negative for dysuria.  Neurological: Negative for syncope.  All other systems reviewed and are negative.   Physical Exam Updated Vital Signs BP (!) 197/83 (BP Location: Right Arm)   Pulse 94   Temp 98.7 F (37.1 C) (Oral)   Resp 16   SpO2 93%   Physical Exam Vitals and nursing note reviewed.  Constitutional:      General: She is not in acute distress.    Appearance: She is well-developed. She is not toxic-appearing.  HENT:     Head: Normocephalic and atraumatic.  Eyes:     General:        Right eye: No discharge.        Left eye: No discharge.     Conjunctiva/sclera: Conjunctivae normal.  Cardiovascular:     Rate and Rhythm: Normal rate and regular rhythm.  Pulmonary:     Effort: No respiratory distress.     Breath sounds: Wheezing (Expiratory throughout) present. No rhonchi or rales.     Comments: SPO2 90% on room air, placed on 1 L with  improvement to 95%. Abdominal:     General: There is no distension.     Palpations: Abdomen is soft.     Tenderness: There is no abdominal tenderness.  Musculoskeletal:     Cervical back: Neck supple.     Comments: 1-2+ symmetric pitting edema to the bilateral lower legs without overlying erythema/warmth or calf tenderness.  Skin:    General: Skin is warm and dry.     Findings: No rash.  Neurological:     Mental Status: She is alert.     Comments: Clear speech.   Psychiatric:        Behavior: Behavior normal.    ED Results / Procedures / Treatments   Labs (all labs  ordered are listed, but only abnormal results are displayed) Labs Reviewed - No data to display  EKG None  Radiology  CLINICAL DATA: Shortness of breath  EXAM: PORTABLE CHEST 1 VIEW  COMPARISON: July 09, 2020.  FINDINGS: There is no edema or airspace opacity. Heart is upper normal in size with pulmonary vascularity normal. No adenopathy. There is aortic atherosclerosis. No pneumothorax. There is superior migration of the right humeral head.  IMPRESSION: No edema or airspace opacity. Heart upper normal in size.  Superior migration of the right humeral head likely indicates chronic rotator cuff tear on the right.  Aortic Atherosclerosis (ICD10-I70.0).   Electronically Signed By: Lowella Grip III M.D. On: 07/23/2020 14:55  Procedures Procedures (including critical care time)  Medications Ordered in ED Medications - No data to display  ED Course  I have reviewed the triage vital signs and the nursing notes.  Pertinent labs & imaging results that were available during my care of the patient were reviewed by me and considered in my medical decision making (see chart for details).  Chloe Wilcox was evaluated in Emergency Department on 07/23/2020 for the symptoms described in the history of present illness. He/she was evaluated in the context of the global COVID-19 pandemic, which  necessitated consideration that the patient might be at risk for infection with the SARS-CoV-2 virus that causes COVID-19. Institutional protocols and algorithms that pertain to the evaluation of patients at risk for COVID-19 are in a state of rapid change based on information released by regulatory bodies including the CDC and federal and state organizations. These policies and algorithms were followed during the patient's care in the ED.    MDM Rules/Calculators/A&P                         Patient presents to the ED from her pulmonology office for evaluation of dyspnea with hypoxia with ambulation in clinic.  Patient is nontoxic, her blood pressure is elevated, 604 systolic upon my assessment, she also appears to have a new oxygen requirement, only needing 1 L at rest.  She is expiratory wheezing throughout and 1-2+ pitting edema to the bilateral lower legs which is symmetric.  DDX: COPD exacerbation, new onset CHF, pneumonia, COVID-19, VTE, pneumothorax, atypical ACS, anemia.  Solumedrol & albuterol/atrovent puffs w/ spacer ordered, per hospital protocol no neb until covid results at this time.   Additional history obtained:  Additional history obtained from chart review and nursing note reviewed.. Previous records obtained and reviewed. Last echo 05/2019- EF 60-65%.  EKG: no STEMI, poor baseline  Lab Tests:  I Ordered, reviewed, and interpreted labs, which included:  CBC: Anemia similar to prior.  CMP, BNP, troponin, & COVID testing pending.   Imaging Studies ordered:  I ordered imaging studies which included CXR, I independently visualized and interpreted imaging which does not show overt pulmonary edema or pneumonia.   15:10: Patient care signed out to Dr. Lin Landsman @ change of shift pending remaining work up & disposition.   Findings and plan of care discussed with supervising physician Dr. Tyrone Nine who has evaluated the patient & is in agreement.   Portions of this note were generated  with [employer]. Dictation errors may occur despite best attempts at proofreading.  Final Clinical Impression(s) / ED Diagnoses Final diagnoses:  COPD exacerbation [facility])    Rx / DC Orders ED Discharge Orders    None       Amaryllis Dyke, PA-C 07/23/20  [place], [place] 07/27/20 1531

## 2020-07-23 NOTE — ED Notes (Signed)
Help get patient undress on the monitor did ekg shown to Dr Tyrone Nine patient is resting with call bell in reach placed a external cath

## 2020-07-23 NOTE — H&P (Signed)
History and Physical  Chloe Wilcox POE:[id number] DOB: 1935-07-16 DOA: 07/23/2020  PCP: [name], MD Patient coming from: Home.   I have personally briefly reviewed patient's old medical records in [place]   Chief Complaint: Low oxygen.   HPI: HORTENCE CHARTER is a 85 y.o. female COPD, who presented to her pulmonologist office complaining of 1 week of cough, wheezing and shortness of breath the day of admission.  2 weeks prior to admission patient was treated for a COPD flareup with prednisone for 5 days.  She improved, until this last week when she started having worsening cough and wheezing.  She reports shortness of breath.  She was noted to be hypoxic oxygen saturation 78% on room air.  Patient was transferred to the emergency department for further evaluation of acute exacerbation of COPD with hypoxemia. She report SOB worse over last week. Denies chest pain, nausea, vomiting. She report cough, moderate, non productive.  In the ED patient received IV Solu-Medrol, albuterol Atrovent.  She continued to be hypoxic requiring 2 L of oxygen.  Evaluation in the ED: Blood gas venous PO2 47, PCO2 58, pH 7.3, sodium 141, potassium 4.1, glucose 107, BUN 13, creatinine 0.6, liver function test normal, BNP 78, troponin 9, hemoglobin 11.8.  Chest x-ray; no edema or fluid wave opacity.  COVID-19 negative  Review of Systems: All systems reviewed and apart from history of presenting illness, are negative.  Past Medical History:  Diagnosis Date  . Depression   . Hypertension    Past Surgical History:  Procedure Laterality Date  . VAGINAL HYSTERECTOMY     Social History:  reports that she has quit smoking. She has never used smokeless tobacco. She reports that she does not drink alcohol and does not use drugs.   No Known Allergies  Family History  Family history unknown: Yes  Mother ; stroke Father; MI  Prior to Admission medications   Medication Sig Start Date End  Date Taking? Authorizing Provider  acetaminophen (TYLENOL) 650 MG CR tablet Take by mouth.    [provider]  albuterol (VENTOLIN HFA) 108 (90 Base) MCG/ACT inhaler Inhale 2 puffs into the lungs every 6 (six) hours as needed for wheezing or shortness of breath. 01/01/20   Icard, Leory Plowman L, DO  amLODipine (NORVASC) 10 MG tablet TAKE 1 TABLET BY MOUTH EVERY DAY 05/21/19   [provider]  aspirin 325 MG tablet Take 325 mg by mouth daily.    [provider]  atorvastatin (LIPITOR) 40 MG tablet Take 40 mg by mouth at bedtime.    [provider]  benzonatate (TESSALON) 100 MG capsule Take 1 capsule (100 mg total) by mouth every 8 (eight) hours. Patient taking differently: Take 100 mg by mouth 3 (three) times daily as needed for cough.  01/07/17   Larene Pickett, PA-C  citalopram (CELEXA) 20 MG tablet Take 20 mg by mouth daily.    [provider]  diclofenac (VOLTAREN) 75 MG EC tablet Take 75 mg by mouth 2 (two) times daily.    [provider]  Fe Fum-FePoly-Vit C-Vit B3 (INTEGRA PO) Take 1 capsule by mouth See admin instructions. Take 1 capsule by mouth on an empty stomach, an hour before first meal of the day    [provider]  fluticasone (FLOVENT HFA) 110 MCG/ACT inhaler Inhale 2 puffs into the lungs 2 (two) times daily. 06/16/19 06/15/20  Pokhrel, Corrie Mckusick, MD  furosemide (LASIX) 20 MG tablet Take  40 mg by mouth daily as needed. 11/25/19   [provider]  hydrochlorothiazide (HYDRODIURIL) 25 MG tablet Take 25 mg by mouth every other day.    [provider]  Multiple Vitamin (MULTIVITAMIN) tablet Take 1 tablet by mouth daily.    [provider]  olmesartan (BENICAR) 20 MG tablet Take 20 mg by mouth daily.    [provider]  oxyCODONE-acetaminophen (PERCOCET/ROXICET) 5-325 MG tablet Take 1 tablet by mouth 2 (two) times daily as needed for moderate pain.  01/02/17   [provider]  tiZANidine  (ZANAFLEX) 2 MG tablet 1 TABLET EVERY 8 HRS AS NEEDED FOR MUSCLE SPASM ORALLY 30 DAYS 12/02/19   [provider]  traMADol (ULTRAM) 50 MG tablet Take 50 mg by mouth daily as needed for moderate pain.    [provider]  umeclidinium-vilanterol (ANORO ELLIPTA) 62.5-25 MCG/INH AEPB Inhale 1 puff into the lungs daily. 01/01/20   Garner Nash, DO   Physical Exam: Vitals:   07/23/20 1415 07/23/20 1530 07/23/20 1600 07/23/20 1700  BP: (!) 147/65 (!) 148/69 (!) 155/70 140/68  Pulse: 82 73 76 78  Resp: 20 13 17 19   Temp:      TempSrc:      SpO2: 97% 94% 91% 99%  Weight:      Height:         General exam: Moderately built and nourished patient, lying comfortably supine on the gurney in no obvious distress.  Head, eyes and ENT: Nontraumatic and normocephalic. Pupils equally reacting to light and accommodation. Oral mucosa moist.  Neck: Supple. No JVD, carotid bruit or thyromegaly.  Lymphatics: No lymphadenopathy.  Respiratory system:  No increased work of breathing. Bilateral ronchus, wheezing.   Cardiovascular system: S1 and S2 heard, RRR. No JVD, murmurs, gallops, clicks or pedal edema.  Gastrointestinal system: Abdomen is nondistended, soft and nontender. Normal bowel sounds heard. No organomegaly or masses appreciated.  Central nervous system: Alert and oriented. No focal neurological deficits.  Extremities: Symmetric 5 x 5 power. Peripheral pulses symmetrically felt.   Skin: No rashes or acute findings.  Musculoskeletal system: Negative exam.  Psychiatry: Pleasant and cooperative.   Labs on Admission:  Basic Metabolic Panel: Recent Labs  Lab 07/23/20 1415 07/23/20 1706  NA 141 142  K 4.1 3.7  CL 104  --   CO2 28  --   GLUCOSE 107*  --   BUN 13  --   CREATININE 0.67  --   CALCIUM 9.2  --    Liver Function Tests: Recent Labs  Lab 07/23/20 1415  AST 21  ALT 13  ALKPHOS 84  BILITOT 0.6  PROT 6.9  ALBUMIN 4.3   No results for input(s):  LIPASE, AMYLASE in the last 168 hours. No results for input(s): AMMONIA in the last 168 hours. CBC: Recent Labs  Lab 07/23/20 1415 07/23/20 1706  WBC 7.7  --   NEUTROABS 5.4  --   HGB 11.8* 13.6  HCT 40.9 40.0  MCV 90.3  --   PLT 341  --    Cardiac Enzymes: No results for input(s): CKTOTAL, CKMB, CKMBINDEX, TROPONINI in the last 168 hours.  BNP (last 3 results) No results for input(s): PROBNP in the last 8760 hours. CBG: No results for input(s): GLUCAP in the last 168 hours.  Radiological Exams on Admission: DG Chest Portable 1 View  Result Date: 07/23/2020 CLINICAL DATA:  Shortness of breath EXAM: PORTABLE CHEST 1 VIEW COMPARISON:  July 09, 2020. FINDINGS: There  is no edema or airspace opacity. Heart is upper normal in size with pulmonary vascularity normal. No adenopathy. There is aortic atherosclerosis. No pneumothorax. There is superior migration of the right humeral head. IMPRESSION: No edema or airspace opacity.  Heart upper normal in size. Superior migration of the right humeral head likely indicates chronic rotator cuff tear on the right. Aortic Atherosclerosis (ICD10-I70.0). Electronically Signed   By: Lowella Grip III M.D.   On: 07/23/2020 14:55    EKG: Independently reviewed. Sinus rhythm.   Assessment/Plan Active Problems:   Acute respiratory failure with hypoxia (HCC)   COPD exacerbation (HCC)   Essential hypertension   Hypoxemia  1-Acute hypoxic respiratory failure: Secondary to COPD exacerbation Admit to med-surgery.  Continue with IV solumedrol 60 mg every 8 hours.  Schedule nebulizer albuterol/ipatropium.  Continue with anoro.  Continue with azithromycin.  She will reqiuire oxygen on ambulation evaluation prior to discharge.   2-HTN; continue with olmersartan, lasix.   3-Awaiting med rec reviewed for rest of medications.       DVT Prophylaxis: Lovenox Code Status: Partial Code; ok to CPR, medication, NIVentilation, defibrillation. Patient  does not wish to be intubated.  Family Communication: Daughter over phone Disposition Plan: admit under observation for COPD exacerbation and new oxygen requirement.   Time spent: 75 minutes  Elmarie Shiley MD [facility]   07/23/2020, 5:58 PM

## 2020-07-23 NOTE — ED Triage Notes (Signed)
Pt from pulmonology office via ems; being seen for increasing sob x 2 weeks; has covid vaccine; prednisone given x 2 weeks ago, finished prescription; desat to 33s on RA at office while ambulating; sent to ed for further evaluation; 99% 2L at rest; hx copd; expiratory wheezing, decreased on left, no known sick contacts; increased swelling in LE; no hx CHF; does take lasix; a and o x 4 on arrival; right sided deficits from previous stroke  168/82 HR 80 RR 16-18 99% 2L 12 lead unremarkable

## 2020-07-23 NOTE — ED Provider Notes (Signed)
  [facility] EMERGENCY DEPARTMENT Provider Note  Care of patient assumed from [facility] at 12:02 AM.  See their note for further details of patient presentation and ED course.  Briefly, 85 y.o. female with PMH below presents from [facility] clinic with new ambulatory hypoxia, SOB, cough concerning for COPD exacerbation with AHRF.  Past Medical History:  Diagnosis Date  . Depression   . Hypertension    Vitals:   07/23/20 2130 07/23/20 2215  BP: (!) 119/51 (!) 126/55  Pulse: 81 75  Resp: 16 14  Temp:    SpO2: 96% 96%    Plan at time of Handoff:  Will follow up COVID, ambulate for sats. Anticipate admission.   MDM/ED Course: Clinical Course as of Jul 24 1  Thu Jul 23, 2020  1604 SARS Coronavirus 2: NEGATIVE [JR]    Clinical Course User Index [JR] Renold Genta, MD    Patient reassessed immediately following handoff, she is saturating in the mid 90s on room air, some expiratory wheezing appreciable in some degree of volume overload on exam.  She speaks in full sentences without becoming dyspneic.  We will add on VBG, antibiotics for COPD exacerbation, will follow up BNP and ambulatory sat.  BNP returning unremarkable at 72, this is somewhat reassuring though on exam she does appear to be somewhat volume overloaded.   Patient ambulated with desaturating to the 70s, placed back in the bed and on 1 L of oxygen.  Internal medicine consulted for admission who agreed admit the patient to their service.  Impression: On re-evaluation patients VS were stable  I discussed all relevant finding and clinical impression to the patient. The patient expressed understanding of their condition as well as the disposition proposed and expressed agreement.  The above statements and care plan were discussed with my attending physician Dr Kathrynn Humble.  Clinical Impression: 1. COPD exacerbation ([place])     Admit  Labs, studies and imaging reviewed by myself and  considered in medical decision making if ordered. Imaging interpreted by radiology. Pt was discussed with my attending, Dr. Kathrynn Humble  Electronically signed by:  Roderic Palau [place]8/27/202112:02 AM      Renold Genta, MD 07/24/20 Dyann Kief    Varney Biles, MD 07/25/20 [id number]

## 2020-07-23 NOTE — ED Notes (Signed)
Pt stats dropped to 77% on RA. Patient did not state that she was short of breath. Patient stated that "she feels so much better than this morning". Patient did not fall or have any injuries during ambulation. Patient was placed back into bed, call bed within reach, bed rails up x2.

## 2020-07-23 NOTE — Assessment & Plan Note (Addendum)
Acute exacerbation with associated hypoxemia -will need further evaluation with emergency room care and probable hospital admission  Report to EMS EMT . Patient stable for transport.   Plan  Patient Instructions  EMS to transport to ER .

## 2020-07-23 NOTE — Telephone Encounter (Signed)
911 called for emergency transport to the hospital for sats of 78%, COPD.

## 2020-07-23 NOTE — Patient Instructions (Addendum)
EMS to transport to ER .

## 2020-07-24 ENCOUNTER — Ambulatory Visit: Payer: Medicare Other | Admitting: Adult Health

## 2020-07-24 ENCOUNTER — Encounter (HOSPITAL_COMMUNITY): Payer: Self-pay | Admitting: Internal Medicine

## 2020-07-24 DIAGNOSIS — J441 Chronic obstructive pulmonary disease with (acute) exacerbation: Principal | ICD-10-CM

## 2020-07-24 DIAGNOSIS — Z8249 Family history of ischemic heart disease and other diseases of the circulatory system: Secondary | ICD-10-CM | POA: Diagnosis not present

## 2020-07-24 DIAGNOSIS — Z79899 Other long term (current) drug therapy: Secondary | ICD-10-CM | POA: Diagnosis not present

## 2020-07-24 DIAGNOSIS — I10 Essential (primary) hypertension: Secondary | ICD-10-CM | POA: Diagnosis present

## 2020-07-24 DIAGNOSIS — J189 Pneumonia, unspecified organism: Secondary | ICD-10-CM | POA: Diagnosis not present

## 2020-07-24 DIAGNOSIS — Z20822 Contact with and (suspected) exposure to covid-19: Secondary | ICD-10-CM | POA: Diagnosis present

## 2020-07-24 DIAGNOSIS — J9601 Acute respiratory failure with hypoxia: Secondary | ICD-10-CM | POA: Diagnosis not present

## 2020-07-24 DIAGNOSIS — Z7982 Long term (current) use of aspirin: Secondary | ICD-10-CM | POA: Diagnosis not present

## 2020-07-24 DIAGNOSIS — Z87891 Personal history of nicotine dependence: Secondary | ICD-10-CM | POA: Diagnosis not present

## 2020-07-24 DIAGNOSIS — R0902 Hypoxemia: Secondary | ICD-10-CM | POA: Diagnosis present

## 2020-07-24 DIAGNOSIS — J9621 Acute and chronic respiratory failure with hypoxia: Secondary | ICD-10-CM | POA: Diagnosis present

## 2020-07-24 DIAGNOSIS — Z823 Family history of stroke: Secondary | ICD-10-CM | POA: Diagnosis not present

## 2020-07-24 DIAGNOSIS — Z90710 Acquired absence of both cervix and uterus: Secondary | ICD-10-CM | POA: Diagnosis not present

## 2020-07-24 DIAGNOSIS — F329 Major depressive disorder, single episode, unspecified: Secondary | ICD-10-CM | POA: Diagnosis present

## 2020-07-24 LAB — PROCALCITONIN: Procalcitonin: <0.1 ng/mL

## 2020-07-24 LAB — CBC
HCT: 38.6 % (ref 36.0–46.0)
Hemoglobin: 11.2 g/dL — ABNORMAL LOW (ref 12.0–15.0)
MCH: 26.1 pg (ref 26.0–34.0)
MCHC: 29 g/dL — ABNORMAL LOW (ref 30.0–36.0)
MCV: 90 fL (ref 80.0–100.0)
Platelets: 311 10*3/uL (ref 150–400)
RBC: 4.29 MIL/uL (ref 3.87–5.11)
RDW: 14.7 % (ref 11.5–15.5)
WBC: 7.9 10*3/uL (ref 4.0–10.5)
nRBC: 0 % (ref 0.0–0.2)

## 2020-07-24 LAB — BASIC METABOLIC PANEL
Anion gap: 11 (ref 5–15)
BUN: 13 mg/dL (ref 8–23)
CO2: 28 mmol/L (ref 22–32)
Calcium: 9.5 mg/dL (ref 8.9–10.3)
Chloride: 101 mmol/L (ref 98–111)
Creatinine, Ser: 0.67 mg/dL (ref 0.44–1.00)
GFR calc Af Amer: >60 mL/min (ref >60)
GFR calc non Af Amer: >60 mL/min (ref >60)
Glucose, Bld: 221 mg/dL — ABNORMAL HIGH (ref 70–99)
Potassium: 3.9 mmol/L (ref 3.5–5.1)
Sodium: 140 mmol/L (ref 135–145)

## 2020-07-24 MED ORDER — ASPIRIN 325 MG PO TABS
325.0000 mg | ORAL_TABLET | Freq: Every day | ORAL | Status: DC
  Administered 2020-07-24 – 2020-07-27 (×4): 325 mg via ORAL
  Filled 2020-07-24 (×4): qty 1

## 2020-07-24 MED ORDER — GABAPENTIN 100 MG PO CAPS
100.0000 mg | ORAL_CAPSULE | Freq: Two times a day (BID) | ORAL | Status: DC
  Administered 2020-07-24 – 2020-07-27 (×7): 100 mg via ORAL
  Filled 2020-07-24 (×7): qty 1

## 2020-07-24 MED ORDER — ATORVASTATIN CALCIUM 40 MG PO TABS
40.0000 mg | ORAL_TABLET | Freq: Every day | ORAL | Status: DC
  Administered 2020-07-24 – 2020-07-26 (×3): 40 mg via ORAL
  Filled 2020-07-24 (×3): qty 1

## 2020-07-24 MED ORDER — FLUTICASONE PROPIONATE HFA 110 MCG/ACT IN AERO: 2.0000 | INHALATION_SPRAY | Freq: Two times a day (BID) | RESPIRATORY_TRACT | Status: DC

## 2020-07-24 MED ORDER — ADULT MULTIVITAMIN W/MINERALS CH
1.0000 | ORAL_TABLET | Freq: Every day | ORAL | Status: DC
  Administered 2020-07-24 – 2020-07-27 (×4): 1 via ORAL
  Filled 2020-07-24 (×4): qty 1

## 2020-07-24 MED ORDER — ONDANSETRON HCL 4 MG/2ML IJ SOLN: 4.0000 mg | Freq: Four times a day (QID) | INTRAMUSCULAR | Status: DC | PRN

## 2020-07-24 MED ORDER — CITALOPRAM HYDROBROMIDE 20 MG PO TABS
20.0000 mg | ORAL_TABLET | Freq: Every day | ORAL | Status: DC
  Administered 2020-07-24 – 2020-07-27 (×4): 20 mg via ORAL
  Filled 2020-07-24 (×3): qty 1
  Filled 2020-07-24: qty 2

## 2020-07-24 MED ORDER — AMLODIPINE BESYLATE 10 MG PO TABS: 10.0000 mg | ORAL_TABLET | Freq: Every day | ORAL | Status: DC | PRN

## 2020-07-24 MED ORDER — TIZANIDINE HCL 2 MG PO TABS: 2.0000 mg | ORAL_TABLET | Freq: Three times a day (TID) | ORAL | Status: DC | PRN

## 2020-07-24 MED ORDER — SODIUM CHLORIDE 0.9% FLUSH: 3.0000 mL | INTRAVENOUS | Status: DC | PRN

## 2020-07-24 MED ORDER — DICLOFENAC SODIUM 25 MG PO TBEC
50.0000 mg | DELAYED_RELEASE_TABLET | Freq: Two times a day (BID) | ORAL | Status: DC
  Administered 2020-07-24 – 2020-07-27 (×6): 50 mg via ORAL
  Filled 2020-07-24 (×3): qty 2
  Filled 2020-07-24: qty 1
  Filled 2020-07-24 (×4): qty 2

## 2020-07-24 MED ORDER — ACETAMINOPHEN 650 MG RE SUPP: 650.0000 mg | Freq: Four times a day (QID) | RECTAL | Status: DC | PRN

## 2020-07-24 MED ORDER — IRBESARTAN 75 MG PO TABS
75.0000 mg | ORAL_TABLET | Freq: Every day | ORAL | Status: DC
  Administered 2020-07-25 – 2020-07-27 (×3): 75 mg via ORAL
  Filled 2020-07-24 (×4): qty 1

## 2020-07-24 MED ORDER — TRAMADOL HCL 50 MG PO TABS: 50.0000 mg | ORAL_TABLET | Freq: Every day | ORAL | Status: DC | PRN

## 2020-07-24 MED ORDER — SODIUM CHLORIDE 0.9% FLUSH
3.0000 mL | Freq: Two times a day (BID) | INTRAVENOUS | Status: DC
  Administered 2020-07-24 – 2020-07-27 (×7): 3 mL via INTRAVENOUS

## 2020-07-24 MED ORDER — ACETAMINOPHEN 325 MG PO TABS: 650.0000 mg | ORAL_TABLET | Freq: Four times a day (QID) | ORAL | Status: DC | PRN

## 2020-07-24 MED ORDER — GUAIFENESIN ER 600 MG PO TB12
600.0000 mg | ORAL_TABLET | Freq: Two times a day (BID) | ORAL | Status: DC
  Administered 2020-07-24 – 2020-07-27 (×7): 600 mg via ORAL
  Filled 2020-07-24 (×7): qty 1

## 2020-07-24 MED ORDER — ONDANSETRON HCL 4 MG PO TABS: 4.0000 mg | ORAL_TABLET | Freq: Four times a day (QID) | ORAL | Status: DC | PRN

## 2020-07-24 MED ORDER — SODIUM CHLORIDE 0.9 % IV SOLN: 250.0000 mL | INTRAVENOUS | Status: DC | PRN

## 2020-07-24 MED ORDER — FUROSEMIDE 40 MG PO TABS
40.0000 mg | ORAL_TABLET | Freq: Every day | ORAL | Status: DC
  Administered 2020-07-24 – 2020-07-27 (×4): 40 mg via ORAL
  Filled 2020-07-24: qty 2
  Filled 2020-07-24 (×3): qty 1

## 2020-07-24 MED ORDER — ENOXAPARIN SODIUM 40 MG/0.4ML ~~LOC~~ SOLN
40.0000 mg | SUBCUTANEOUS | Status: DC
  Administered 2020-07-24 – 2020-07-27 (×4): 40 mg via SUBCUTANEOUS
  Filled 2020-07-24 (×4): qty 0.4

## 2020-07-24 NOTE — Progress Notes (Signed)
Spoke with patient's daughter, Sheppard Plumber, listed on DPR regarding CXR results per Rexene Edison NP.  She verified understanding.  She stated that her mom is in the hospital with a flare up and infection.  She is currently on an antibiotic and prednisone.  She will call for a post hospital f/u visit after she is d/c'd from the hospital.  Nothing further needed.

## 2020-07-24 NOTE — Progress Notes (Signed)
PROGRESS NOTE  Chloe Wilcox KYH:[id number] DOB: 1935-01-26 DOA: 07/23/2020 PCP: [name], MD  Brief History   The patient is a 85 yr old woman who uses no oxygen at home. She had been having cough, wheezing and shortness breath which had been treated with prednisone taper and nebulizer treatments. She seemed to be getting better until 07/22/2020 when she began to have worsening shortness of breath and cough. She was seen in her pulmonologist's office. She was found to be hypoxic on room air there with saturations of 78% on room air.    In the ED she was found to have an acute exacerbation of COPD with hypoxemia. Repiratory viral panel was negative. She was given solumedrol and nebulizer treatments as well as azothrymycin and rocephin.   [facility] were consulted to admit for further evaluation and treatment.  Consultants  . None  Procedures  . None  Antibiotics   Anti-infectives (From admission, onward)   Start     Dose/Rate Route Frequency Ordered Stop   07/24/20 1000  azithromycin (ZITHROMAX) tablet 500 mg        500 mg Oral Daily 07/23/20 1831     07/23/20 1515  cefTRIAXone (ROCEPHIN) 1 g in sodium chloride 0.9 % 100 mL IVPB        1 g 200 mL/hr over 30 Minutes Intravenous  Once 07/23/20 1512 07/23/20 1630   07/23/20 1515  azithromycin (ZITHROMAX) 500 mg in sodium chloride 0.9 % 250 mL IVPB        500 mg 250 mL/hr over 60 Minutes Intravenous  Once 07/23/20 1512 07/23/20 1730    .  Subjective  The patient is resting quietly. No new complaints.   Objective   Vitals:  Vitals:   07/24/20 1145 07/24/20 1324  BP: 134/67   Pulse: 79   Resp: 16   Temp:  99 F (37.2 C)  SpO2: 94%   Exam:  Constitutional:  . The patient is awake, alert, and oriented x 3. Mild distress due to dyspnea with conversation. Respiratory:  . Positive for accessory muscle use and tachypnea with conversation. . Diminished breath sounds throughout. . No wheezes, rales, or  rhonchi . No tactile fremitus Cardiovascular:  . Regular rate and rhythm . No murmurs, ectopy, or gallups. . No lateral PMI. No thrills. Abdomen:  . Abdomen is soft, non-tender, non-distended . No hernias, masses, or organomegaly . Normoactive bowel sounds.  Musculoskeletal:  . No cyanosis, clubbing, or edema Skin:  . No rashes, lesions, ulcers . palpation of skin: no induration or nodules Neurologic:  . CN 2-12 intact . Sensation all 4 extremities intact Psychiatric:  . Mental status o Mood, affect appropriate o Orientation to person, place, time  . judgment and insight appear intact   I have personally reviewed the following:   Today's Data  . Vitals, BMP, CBC  Micro Data  . Respiratory Viral Panel: Negative  Imaging  . CXR; No acute cardiopulmonary pathology - Chronic right rotator cuff tear  Scheduled Meds: . aspirin  325 mg Oral Daily  . atorvastatin  40 mg Oral QHS  . azithromycin  500 mg Oral Daily  . citalopram  20 mg Oral Daily  . diclofenac  50 mg Oral BID  . enoxaparin (LOVENOX) injection  40 mg Subcutaneous Q24H  . furosemide  40 mg Oral Daily  . gabapentin  100 mg Oral BID  . guaiFENesin  600 mg Oral BID  . ipratropium-albuterol  3 mL Nebulization Q6H  .  irbesartan  75 mg Oral Daily  . methylPREDNISolone (SOLU-MEDROL) injection  60 mg Intravenous Q8H  . multivitamin with minerals  1 tablet Oral Daily  . sodium chloride flush  3 mL Intravenous Q12H  . umeclidinium-vilanterol  1 puff Inhalation Daily   Continuous Infusions: . sodium chloride      Active Problems:   Acute respiratory failure with hypoxia (HCC)   COPD exacerbation (HCC)   Essential hypertension   Hypoxemia   LOS: 0 days   A & P  Acute hypoxic respiratory failure: The patient is on no oxygen at home. She is requiring 2L by nasal cannula here and still has some conversational dyspnea. Continue nebulizer treatments and steroids taper.  COPD exacerbation: The patient is  receiving nebulizer treatments and steroids. Taper steroids as possible  Hypertension: Well controlled on norvasc and avapro as at home. Monitor.  Advanced age: Noted.  Pneumonia: Doubtful. No fevers, no WBC count. Will check procalcitonin and stop antibiotics if negative.  I have seen and examined this patient myself. I have spent 32 minutes in her evaluation and care.  DVT Prophylaxis: Lovenox CODE STATUS: partial code - she wants all but intubation/ventilation Family Communication: None available Disposition:  Status is: Inpatient  Remains inpatient appropriate because:Hemodynamically unstable and Inpatient level of care appropriate due to severity of illness  Dispo: The patient is from: Home              Anticipated d/c is to: Home              Anticipated d/c date is: 1 day              Patient currently is not medically stable to d/c.       [name], DO [facility] Direct contact: see [email or web address]  7PM-7AM contact night coverage as above 07/24/2020, 2:18 PM  LOS: 0 days

## 2020-07-24 NOTE — ED Notes (Signed)
Breakfast Ordered 

## 2020-07-25 DIAGNOSIS — R0902 Hypoxemia: Secondary | ICD-10-CM

## 2020-07-25 LAB — BASIC METABOLIC PANEL
Anion gap: 7 (ref 5–15)
BUN: 21 mg/dL (ref 8–23)
CO2: 29 mmol/L (ref 22–32)
Calcium: 9.3 mg/dL (ref 8.9–10.3)
Chloride: 101 mmol/L (ref 98–111)
Creatinine, Ser: 0.83 mg/dL (ref 0.44–1.00)
GFR calc Af Amer: >60 mL/min (ref >60)
GFR calc non Af Amer: >60 mL/min (ref >60)
Glucose, Bld: 144 mg/dL — ABNORMAL HIGH (ref 70–99)
Potassium: 4.1 mmol/L (ref 3.5–5.1)
Sodium: 137 mmol/L (ref 135–145)

## 2020-07-25 LAB — CBC WITH DIFFERENTIAL/PLATELET
Abs Immature Granulocytes: 0.07 10*3/uL (ref 0.00–0.07)
Basophils Absolute: 0 10*3/uL (ref 0.0–0.1)
Basophils Relative: 0 %
Eosinophils Absolute: 0 10*3/uL (ref 0.0–0.5)
Eosinophils Relative: 0 %
HCT: 34.1 % — ABNORMAL LOW (ref 36.0–46.0)
Hemoglobin: 10.1 g/dL — ABNORMAL LOW (ref 12.0–15.0)
Immature Granulocytes: 1 %
Lymphocytes Relative: 3 %
Lymphs Abs: 0.4 10*3/uL — ABNORMAL LOW (ref 0.7–4.0)
MCH: 26.1 pg (ref 26.0–34.0)
MCHC: 29.6 g/dL — ABNORMAL LOW (ref 30.0–36.0)
MCV: 88.1 fL (ref 80.0–100.0)
Monocytes Absolute: 0.4 10*3/uL (ref 0.1–1.0)
Monocytes Relative: 4 %
Neutro Abs: 11.6 10*3/uL — ABNORMAL HIGH (ref 1.7–7.7)
Neutrophils Relative %: 92 %
Platelets: 318 10*3/uL (ref 150–400)
RBC: 3.87 MIL/uL (ref 3.87–5.11)
RDW: 14.9 % (ref 11.5–15.5)
WBC: 12.5 10*3/uL — ABNORMAL HIGH (ref 4.0–10.5)
nRBC: 0 % (ref 0.0–0.2)

## 2020-07-25 LAB — PROCALCITONIN: Procalcitonin: <0.1 ng/mL

## 2020-07-25 MED ORDER — IPRATROPIUM-ALBUTEROL 0.5-2.5 (3) MG/3ML IN SOLN
3.0000 mL | Freq: Two times a day (BID) | RESPIRATORY_TRACT | Status: DC
  Administered 2020-07-26 – 2020-07-27 (×3): 3 mL via RESPIRATORY_TRACT
  Filled 2020-07-25 (×3): qty 3

## 2020-07-25 MED ORDER — PREDNISONE 50 MG PO TABS
60.0000 mg | ORAL_TABLET | Freq: Every day | ORAL | Status: DC
  Administered 2020-07-26 – 2020-07-27 (×2): 60 mg via ORAL
  Filled 2020-07-25 (×2): qty 1

## 2020-07-25 NOTE — Progress Notes (Signed)
PROGRESS NOTE  Chloe Wilcox ZCH:[id number] DOB: 04-22-1935 DOA: 07/23/2020 PCP: [name], MD  Brief History   The patient is a 85 yr old woman who uses no oxygen at home. She had been having cough, wheezing and shortness breath which had been treated with prednisone taper and nebulizer treatments. She seemed to be getting better until 07/22/2020 when she began to have worsening shortness of breath and cough. She was seen in her pulmonologist's office. She was found to be hypoxic on room air there with saturations of 78% on room air.    In the ED she was found to have an acute exacerbation of COPD with hypoxemia. Repiratory viral panel was negative. She was given solumedrol and nebulizer treatments as well as azothrymycin and rocephin.   [facility] were consulted to admit for further evaluation and treatment.  Consultants  . None  Procedures  . None  Antibiotics   Anti-infectives (From admission, onward)   Start     Dose/Rate Route Frequency Ordered Stop   07/24/20 1000  azithromycin (ZITHROMAX) tablet 500 mg        500 mg Oral Daily 07/23/20 1831     07/23/20 1515  cefTRIAXone (ROCEPHIN) 1 g in sodium chloride 0.9 % 100 mL IVPB        1 g 200 mL/hr over 30 Minutes Intravenous  Once 07/23/20 1512 07/23/20 1630   07/23/20 1515  azithromycin (ZITHROMAX) 500 mg in sodium chloride 0.9 % 250 mL IVPB        500 mg 250 mL/hr over 60 Minutes Intravenous  Once 07/23/20 1512 07/23/20 1730     Subjective  The patient is resting quietly. She states that she is feeling better, although she is still requiring 2 L O2.  Objective   Vitals:  Vitals:   07/25/20 0411 07/25/20 1531  BP: 140/67 139/62  Pulse: 75 66  Resp: 17 20  Temp: 99 F (37.2 C) 98.3 F (36.8 C)  SpO2: 98% 100%  Exam:  Constitutional:  The patient is awake, alert, and oriented x 3. No acute distress. Respiratory:  . Positive for accessory muscle use and tachypnea with conversation. . Diminished  breath sounds throughout. . No wheezes, rales, or rhonchi . No tactile fremitus Cardiovascular:  . Regular rate and rhythm . No murmurs, ectopy, or gallups. . No lateral PMI. No thrills. Abdomen:  . Abdomen is soft, non-tender, non-distended . No hernias, masses, or organomegaly . Normoactive bowel sounds.  Musculoskeletal:  . No cyanosis, clubbing, or edema Skin:  . No rashes, lesions, ulcers . palpation of skin: no induration or nodules Neurologic:  . CN 2-12 intact . Sensation all 4 extremities intact Psychiatric:  . Mental status o Mood, affect appropriate o Orientation to person, place, time  . judgment and insight appear intact  I have personally reviewed the following:   Today's Data  . Vitals, BMP, CBC  Micro Data  . Respiratory Viral Panel: Negative  Imaging  . CXR; No acute cardiopulmonary pathology - Chronic right rotator cuff tear  Scheduled Meds: . aspirin  325 mg Oral Daily  . atorvastatin  40 mg Oral QHS  . azithromycin  500 mg Oral Daily  . citalopram  20 mg Oral Daily  . diclofenac  50 mg Oral BID  . enoxaparin (LOVENOX) injection  40 mg Subcutaneous Q24H  . furosemide  40 mg Oral Daily  . gabapentin  100 mg Oral BID  . guaiFENesin  600 mg Oral BID  .  ipratropium-albuterol  3 mL Nebulization Q6H  . irbesartan  75 mg Oral Daily  . methylPREDNISolone (SOLU-MEDROL) injection  60 mg Intravenous Q8H  . multivitamin with minerals  1 tablet Oral Daily  . sodium chloride flush  3 mL Intravenous Q12H  . umeclidinium-vilanterol  1 puff Inhalation Daily   Continuous Infusions: . sodium chloride      Active Problems:   Acute respiratory failure with hypoxia (HCC)   COPD exacerbation (HCC)   Essential hypertension   Hypoxemia   LOS: 1 day   A & P  Acute hypoxic respiratory failure: The patient is on no oxygen at home. She is requiring 2L by nasal cannula here and still has some conversational dyspnea. Continue nebulizer treatments and steroids  taper.  COPD exacerbation: The patient is receiving nebulizer treatments and steroids. Taper steroids as possible  Hypertension: Well controlled on norvasc and avapro as at home. Monitor.  Advanced age: Noted.  Pneumonia: Doubtful. No fevers, no WBC count. Will check procalcitonin and stop antibiotics if negative.  I have seen and examined this patient myself. I have spent 30 minutes in her evaluation and care.  DVT Prophylaxis: Lovenox CODE STATUS: partial code - she wants all but intubation/ventilation Family Communication: None available Disposition:  Status is: Inpatient  Remains inpatient appropriate because:Hemodynamically unstable and Inpatient level of care appropriate due to severity of illness  Dispo: The patient is from: Home              Anticipated d/c is to: Home              Anticipated d/c date is: 1 day              Patient currently is not medically stable to d/c.  Maxi Rodas, DO [facility] Direct contact: see [email or web address]  7PM-7AM contact night coverage as above 07/25/2020, 4:14 PM  LOS: 0 days

## 2020-07-26 LAB — PROCALCITONIN: Procalcitonin: <0.1 ng/mL

## 2020-07-26 NOTE — Progress Notes (Signed)
PROGRESS NOTE  Chloe Wilcox IRW:[id number] DOB: October 04, 1935 DOA: 07/23/2020 PCP: [name], MD  Brief History   The patient is a 85 yr old woman who uses no oxygen at home. She had been having cough, wheezing and shortness breath which had been treated with prednisone taper and nebulizer treatments. She seemed to be getting better until 07/22/2020 when she began to have worsening shortness of breath and cough. She was seen in her pulmonologist's office. She was found to be hypoxic on room air there with saturations of 78% on room air.    In the ED she was found to have an acute exacerbation of COPD with hypoxemia. Repiratory viral panel was negative. She was given solumedrol and nebulizer treatments as well as azothrymycin and rocephin.   [facility] were consulted to admit for further evaluation and treatment.  The patient was admitted to a medical bed. She has been treated with nebulizer treatments, steroids, and antibiotics. Her oxygen requirements have come down to 1 L by nasal cannula. However, when she walks on room air, her sats drop to 86%.   Consultants  . None  Procedures  . None  Antibiotics   Anti-infectives (From admission, onward)   Start     Dose/Rate Route Frequency Ordered Stop   07/24/20 1000  azithromycin (ZITHROMAX) tablet 500 mg        500 mg Oral Daily 07/23/20 1831     07/23/20 1515  cefTRIAXone (ROCEPHIN) 1 g in sodium chloride 0.9 % 100 mL IVPB        1 g 200 mL/hr over 30 Minutes Intravenous  Once 07/23/20 1512 07/23/20 1630   07/23/20 1515  azithromycin (ZITHROMAX) 500 mg in sodium chloride 0.9 % 250 mL IVPB        500 mg 250 mL/hr over 60 Minutes Intravenous  Once 07/23/20 1512 07/23/20 1730     Subjective  The patient is resting quietly. No new complaints.  Objective   Vitals:  Vitals:   07/26/20 0504 07/26/20 1502  BP: 124/70 (!) 153/73  Pulse: 76 75  Resp: 16 (!) 24  Temp: 98.4 F (36.9 C) 98.7 F (37.1 C)  SpO2: 96% 91%    Exam:  Constitutional:  The patient is awake, alert, and oriented x 3. No acute distress. Respiratory:  . Positive for accessory muscle use and tachypnea with conversation. . Diminished breath sounds throughout. . No wheezes, rales, or rhonchi . No tactile fremitus Cardiovascular:  . Regular rate and rhythm . No murmurs, ectopy, or gallups. . No lateral PMI. No thrills. Abdomen:  . Abdomen is soft, non-tender, non-distended . No hernias, masses, or organomegaly . Normoactive bowel sounds.  Musculoskeletal:  . No cyanosis, clubbing, or edema Skin:  . No rashes, lesions, ulcers . palpation of skin: no induration or nodules Neurologic:  . CN 2-12 intact . Sensation all 4 extremities intact Psychiatric:  . Mental status o Mood, affect appropriate o Orientation to person, place, time  . judgment and insight appear intact  I have personally reviewed the following:   Today's Data  . Vitals, BMP, CBC  Micro Data  . Respiratory Viral Panel: Negative  Imaging  . CXR; No acute cardiopulmonary pathology - Chronic right rotator cuff tear  Scheduled Meds: . aspirin  325 mg Oral Daily  . atorvastatin  40 mg Oral QHS  . azithromycin  500 mg Oral Daily  . citalopram  20 mg Oral Daily  . diclofenac  50 mg Oral BID  .  enoxaparin (LOVENOX) injection  40 mg Subcutaneous Q24H  . furosemide  40 mg Oral Daily  . gabapentin  100 mg Oral BID  . guaiFENesin  600 mg Oral BID  . ipratropium-albuterol  3 mL Nebulization BID  . irbesartan  75 mg Oral Daily  . multivitamin with minerals  1 tablet Oral Daily  . predniSONE  60 mg Oral Q breakfast  . sodium chloride flush  3 mL Intravenous Q12H  . umeclidinium-vilanterol  1 puff Inhalation Daily   Continuous Infusions: . sodium chloride      Active Problems:   Acute respiratory failure with hypoxia (HCC)   COPD exacerbation (HCC)   Essential hypertension   Hypoxemia   LOS: 2 days   A & P  Acute hypoxic respiratory failure: The  patient is on no oxygen at home. Her oxygen saturations drop to 86 percent on room air with ambulation. Continue steroids and nebulizer treatments.  COPD exacerbation: The patient is receiving nebulizer treatments and steroids. Taper steroids as possible  Hypertension: Well controlled on norvasc and avapro as at home. Monitor.  Advanced age: Noted.  Pneumonia: Doubtful. No fevers, no WBC count. Will check procalcitonin and stop antibiotics if negative.  I have seen and examined this patient myself. I have spent 30 minutes in her evaluation and care.  DVT Prophylaxis: Lovenox CODE STATUS: partial code - she wants all but intubation/ventilation Family Communication: None available Disposition:  Status is: Inpatient  Remains inpatient appropriate because:Hemodynamically unstable and Inpatient level of care appropriate due to severity of illness  Dispo: The patient is from: Home              Anticipated d/c is to: Home              Anticipated d/c date is: 1 day              Patient currently is not medically stable to d/c.  Gurjit Loconte, DO [facility] Direct contact: see [email or web address]  7PM-7AM contact night coverage as above 07/26/2020, 6:50 PM  LOS: 0 days

## 2020-07-27 MED ORDER — IPRATROPIUM-ALBUTEROL 0.5-2.5 (3) MG/3ML IN SOLN: 3.0000 mL | Freq: Two times a day (BID) | RESPIRATORY_TRACT | 0 refills | Status: DC

## 2020-07-27 MED ORDER — GUAIFENESIN ER 600 MG PO TB12: 600.0000 mg | ORAL_TABLET | Freq: Two times a day (BID) | ORAL | 0 refills | Status: AC

## 2020-07-27 MED ORDER — PREDNISONE 20 MG PO TABS: ORAL_TABLET | ORAL | 0 refills | Status: AC

## 2020-07-27 MED ORDER — GABAPENTIN 100 MG PO CAPS: 100.0000 mg | ORAL_CAPSULE | Freq: Two times a day (BID) | ORAL | 0 refills | Status: DC

## 2020-07-27 MED ORDER — AZITHROMYCIN 250 MG PO TABS: 250.0000 mg | ORAL_TABLET | Freq: Every day | ORAL | 0 refills | Status: AC

## 2020-07-27 NOTE — Progress Notes (Signed)
Pt sats 88% on room air at rest. sats up to 90% on 2 liters at rest. Ambulatory sats required 4liters of oxygen to maintain 90% and higher

## 2020-07-27 NOTE — TOC Initial Note (Addendum)
Transition of Care [facility]) - Initial/Assessment Note    Patient Details  Name: Chloe Wilcox MRN: [id number] Date of Birth: Mar 31, 1935  Transition of Care [facility] CM/SW Contact:    Marilu Favre, RN Phone Number: 07/27/2020, 12:14 PM  Clinical Narrative:                 Spoke to patient and grand daughter Janett Billow at bedside. Patient from home lives with daughter and Janett Billow. Patient does not have home oxygen. Discussed if home oxygen is ordered a portable tank will be delivered to hospital room prior to discharge. Both voiced understanding awaiting MD order.   Ordered NEB machine and home oxygen with Zach with [employer]. Patient and Janett Billow aware.  Expected Discharge Plan: Home/Self Care Barriers to Discharge: Continued Medical Work up   Patient Goals and CMS Choice Patient states their goals for this hospitalization and ongoing recovery are:: to return to home CMS Medicare.gov Compare Post Acute Care list provided to:: Patient Choice offered to / list presented to : Patient, Adult Children  Expected Discharge Plan and Services Expected Discharge Plan: Home/Self Care   Discharge Planning Services: CM Consult Post Acute Care Choice: Durable Medical Equipment Living arrangements for the past 2 months: Single Family Home                 DME Arranged: Oxygen DME Agency: [employer]       HH Arranged: NA          Prior Living Arrangements/Services Living arrangements for the past 2 months: Single Family Home Lives with:: Adult Children Patient language and need for interpreter reviewed:: Yes Do you feel safe going back to the place where you live?: Yes      Need for Family Participation in Patient Care: Yes (Comment) Care giver support system in place?: Yes (comment)   Criminal Activity/Legal Involvement Pertinent to Current Situation/Hospitalization: No - Comment as needed  Activities of Daily Living Home Assistive Devices/Equipment: Cane (specify quad or straight),  Walker (specify type) ADL Screening (condition at time of admission) Patient's cognitive ability adequate to safely complete daily activities?: Yes Is the patient deaf or have difficulty hearing?: Yes Does the patient have difficulty seeing, even when wearing glasses/contacts?: Yes Does the patient have difficulty concentrating, remembering, or making decisions?: No Patient able to express need for assistance with ADLs?: Yes Does the patient have difficulty dressing or bathing?: No Independently performs ADLs?: Yes (appropriate for developmental age) Does the patient have difficulty walking or climbing stairs?: No Weakness of Legs: None Weakness of Arms/Hands: None  Permission Sought/Granted   Permission granted to share information with : Yes, Verbal Permission Granted  Share Information with NAME: Janett Billow at bedside grand daughter           Emotional Assessment Appearance:: Appears stated age Attitude/Demeanor/Rapport: Engaged Affect (typically observed): Accepting Orientation: : Oriented to Self, Oriented to Place, Oriented to  Time, Oriented to Situation Alcohol / Substance Use: Not Applicable Psych Involvement: No (comment)  Admission diagnosis:  Hypoxemia [R09.02] COPD exacerbation (HCC) [J44.1] Acute respiratory failure with hypoxia ([place]) [J96.01] Patient Active Problem List   Diagnosis Date Noted  . Hypoxemia 07/23/2020  . Respiratory failure ([place]) 06/15/2019  . Acute respiratory failure with hypoxia ([place]) 06/14/2019  . COPD exacerbation ([place]) 06/14/2019  . COPD with acute bronchitis ([place]) 06/14/2019  . Hypokalemia 06/14/2019  . Essential hypertension 06/14/2019   PCP:  [name], MD Pharmacy:   [id number] - [place], [address] [address] Phone: [phone] Fax: [phone]  [facility] [place] [place] [address] Phone: [phone] Fax: [phone]     Social Determinants of Health (SDOH) Interventions    Readmission Risk Interventions No flowsheet data found.

## 2020-07-27 NOTE — Discharge Summary (Signed)
Physician Discharge Summary  Chloe Wilcox BEE:[id number] DOB: [birth year] DOA: 07/23/2020  PCP: [name], MD  Admit date: 07/23/2020 Discharge date: 07/27/2020  Recommendations for Outpatient Follow-up:  1. Discharge to home on 2L O2 by [place]. 2. Follow up with PCP in 7-10 days. She should have chemistry drawn at that visit.  Discharge Diagnoses: Principal diagnosis is #1 1. Acute on chronic hypoxic respiratory failure 2. COPD exacerbation 3. Hypertension 4. Depression  Discharge Condition: Fair  Disposition: Home with O2.  Diet recommendation: Heart healthy  Filed Weights   07/23/20 1352  Weight: 61.8 kg    History of present illness:  Chloe Wilcox is a 85 y.o. female COPD, who presented to her pulmonologist office complaining of 1 week of cough, wheezing and shortness of breath the day of admission.  2 weeks prior to admission patient was treated for a COPD flareup with prednisone for 5 days.  She improved, until this last week when she started having worsening cough and wheezing.  She reports shortness of breath.  She was noted to be hypoxic oxygen saturation 78% on room air.  Patient was transferred to the emergency department for further evaluation of acute exacerbation of COPD with hypoxemia. She report SOB worse over last week. Denies chest pain, nausea, vomiting. She report cough, moderate, non productive.  In the ED patient received IV Solu-Medrol, albuterol Atrovent.  She continued to be hypoxic requiring 2 L of oxygen.  Evaluation in the ED: Blood gas venous PO2 47, PCO2 58, pH 7.3, sodium 141, potassium 4.1, glucose 107, BUN 13, creatinine 0.6, liver function test normal, BNP 78, troponin 9, hemoglobin 11.8.  Chest x-ray; no edema or fluid wave opacity.  COVID-19 negative  Hospital Course:  The patient is a 85 yr old woman who uses no oxygen at home. She had been having cough, wheezing and shortness breath which had been treated with prednisone taper and  nebulizer treatments. She seemed to be getting better until 07/22/2020 when she began to have worsening shortness of breath and cough. She was seen in her pulmonologist's office. She was found to be hypoxic on room air there with saturations of 78% on room air.    In the ED she was found to have an acute exacerbation of COPD with hypoxemia. Repiratory viral panel was negative. She was given solumedrol and nebulizer treatments as well as azothrymycin and rocephin.   [facility] were consulted to admit for further evaluation and treatment.  The patient was admitted to a medical bed. She has been treated with nebulizer treatments, steroids, and antibiotics. Her oxygen requirements have come down to 1 L by nasal cannula. However, when she walks on room air, her sats drop to 86%.   The patient will be discharged to home on 2L o2 by nasal cannula.  Today's assessment: S:The patient is sitting up in bed eating lunch. No new complaints. O: Vitals:  Vitals:   07/27/20 0917 07/27/20 1527  BP:  (!) 137/55  Pulse:  73  Resp:  18  Temp:  98.4 F (36.9 C)  SpO2: 96% 97%   Exam:  Constitutional:  The patient is awake, alert, and oriented x 3. No acute distress. Respiratory:   No increased work of breathing.  No wheezes, rales, or rhonchi  No tactile fremitus Cardiovascular:   Regular rate and rhythm  No murmurs, ectopy, or gallups.  No lateral PMI. No thrills. Abdomen:   Abdomen is soft, non-tender, non-distended  No hernias, masses, or  organomegaly  Normoactive bowel sounds.  Musculoskeletal:   No cyanosis, clubbing, or edema Skin:   No rashes, lesions, ulcers  palpation of skin: no induration or nodules Neurologic:   CN 2-12 intact  Sensation all 4 extremities intact Psychiatric:   Mental status ? Mood, affect appropriate ? Orientation to person, place, time   judgment and insight appear intact  Discharge Instructions  Discharge Instructions     Activity as tolerated - No restrictions   Complete by: As directed    Call MD for:  persistant nausea and vomiting   Complete by: As directed    Call MD for:  severe uncontrolled pain   Complete by: As directed    Diet - low sodium heart healthy   Complete by: As directed    Discharge instructions   Complete by: As directed    Discharge to home on 2L O2 by [place]. Follow up with PCP in 7-10 days. She should have chemistry drawn at that visit.   For home use only DME Nebulizer machine   Complete by: As directed    Patient needs a nebulizer to treat with the following condition: COPD (chronic obstructive pulmonary disease) ([place])   Length of Need: Lifetime   Increase activity slowly   Complete by: As directed      Allergies as of 07/27/2020   No Known Allergies     Medication List    STOP taking these medications   benzonatate 100 MG capsule Commonly known as: TESSALON   ELDERBERRY PO   fluticasone 110 MCG/ACT inhaler Commonly known as: FLOVENT HFA   hydrochlorothiazide 25 MG tablet Commonly known as: HYDRODIURIL   INTEGRA PO   oxyCODONE-acetaminophen 5-325 MG tablet Commonly known as: PERCOCET/ROXICET     TAKE these medications   acetaminophen 650 MG CR tablet Commonly known as: TYLENOL Take 650 mg by mouth every 8 (eight) hours as needed for pain (or headaches).   ProAir HFA 108 (90 Base) MCG/ACT inhaler Generic drug: albuterol Inhale 2 puffs into the lungs every 6 (six) hours as needed for wheezing or shortness of breath.   albuterol 108 (90 Base) MCG/ACT inhaler Commonly known as: VENTOLIN HFA Inhale 2 puffs into the lungs every 6 (six) hours as needed for wheezing or shortness of breath.   amLODipine 10 MG tablet Commonly known as: NORVASC Take 10 mg by mouth daily as needed (IF SYSTOLIC NUMBER IS AT LEAST 100 OR GREATER).   Anoro Ellipta 62.5-25 MCG/INH Aepb Generic drug: umeclidinium-vilanterol Inhale 1 puff into the lungs daily.   aspirin 325 MG  tablet Take 325 mg by mouth daily.   atorvastatin 40 MG tablet Commonly known as: LIPITOR Take 40 mg by mouth at bedtime.   azithromycin 250 MG tablet Commonly known as: ZITHROMAX Take 1 tablet (250 mg total) by mouth daily for 1 day. Start taking on: July 28, 2020   citalopram 20 MG tablet Commonly known as: CELEXA Take 20 mg by mouth daily.   diclofenac 50 MG EC tablet Commonly known as: VOLTAREN Take 50 mg by mouth 2 (two) times daily. What changed: Another medication with the same name was removed. Continue taking this medication, and follow the directions you see here.   furosemide 20 MG tablet Commonly known as: LASIX Take 20-40 mg by mouth See admin instructions. Take 40 mg by mouth in the morning and 20 mg at bedtime   gabapentin 100 MG capsule Commonly known as: NEURONTIN Take 1 capsule (100 mg total) by mouth 2 (  two) times daily. What changed: when to take this   guaiFENesin 600 MG 12 hr tablet Commonly known as: MUCINEX Take 1 tablet (600 mg total) by mouth 2 (two) times daily for 10 days.   ipratropium-albuterol 0.5-2.5 (3) MG/3ML Soln Commonly known as: DUONEB Take 3 mLs by nebulization 2 (two) times daily.   multivitamin tablet Take 1 tablet by mouth daily.   olmesartan 20 MG tablet Commonly known as: BENICAR Take 10 mg by mouth at bedtime.   predniSONE 20 MG tablet Commonly known as: DELTASONE Take 3 tablets (60 mg total) by mouth daily with breakfast for 3 days, THEN 2.5 tablets (50 mg total) daily with breakfast for 3 days, THEN 2 tablets (40 mg total) daily with breakfast for 3 days, THEN 1.5 tablets (30 mg total) daily with breakfast for 3 days, THEN 1 tablet (20 mg total) daily with breakfast for 3 days, THEN 0.5 tablets (10 mg total) daily with breakfast for 3 days. Start taking on: July 28, 2020   tiZANidine 2 MG tablet Commonly known as: ZANAFLEX Take 2 mg by mouth every 8 (eight) hours as needed for muscle spasms.   traMADol 50 MG  tablet Commonly known as: ULTRAM Take 50 mg by mouth daily as needed for moderate pain.            Durable Medical Equipment  (From admission, onward)         Start     Ordered   07/27/20 1559  For home use only DME oxygen  Once       Question Answer Comment  Length of Need Lifetime   Mode or (Route) Nasal cannula   Liters per Minute 2   Frequency Continuous (stationary and portable oxygen unit needed)   Oxygen delivery system Gas      07/27/20 1558   07/27/20 0000  For home use only DME Nebulizer machine       Question Answer Comment  Patient needs a nebulizer to treat with the following condition COPD (chronic obstructive pulmonary disease) ([place])   Length of Need Lifetime      07/27/20 1250         No Known Allergies  The results of significant diagnostics from this hospitalization (including imaging, microbiology, ancillary and laboratory) are listed below for reference.    Significant Diagnostic Studies: DG Chest 2 View  Result Date: 07/10/2020 CLINICAL DATA:  Short of breath EXAM: CHEST - 2 VIEW COMPARISON:  06/14/2019 FINDINGS: Frontal and lateral views of the chest demonstrate a stable cardiac silhouette. There is increased vascular congestion since prior study, without overt edema. No effusion or pneumothorax. No acute bony abnormalities. IMPRESSION: 1. Vascular congestion without overt edema. Electronically Signed   By: Randa Ngo M.D.   On: 07/10/2020 09:32   DG Chest Portable 1 View  Result Date: 07/23/2020 CLINICAL DATA:  Shortness of breath EXAM: PORTABLE CHEST 1 VIEW COMPARISON:  July 09, 2020. FINDINGS: There is no edema or airspace opacity. Heart is upper normal in size with pulmonary vascularity normal. No adenopathy. There is aortic atherosclerosis. No pneumothorax. There is superior migration of the right humeral head. IMPRESSION: No edema or airspace opacity.  Heart upper normal in size. Superior migration of the right humeral head likely  indicates chronic rotator cuff tear on the right. Aortic Atherosclerosis (ICD10-I70.0). Electronically Signed   By: Lowella Grip III M.D.   On: 07/23/2020 14:55    Microbiology: Recent Results (from the past 240 hour(s))  SARS Coronavirus  2 by RT PCR (hospital order, performed in [facility] hospital lab) Nasopharyngeal Nasopharyngeal Swab     Status: None   Collection Time: 07/23/20  1:59 PM   Specimen: Nasopharyngeal Swab  Result Value Ref Range Status   SARS Coronavirus 2 NEGATIVE NEGATIVE Final    Comment: (NOTE) SARS-CoV-2 target nucleic acids are NOT DETECTED.  The SARS-CoV-2 RNA is generally detectable in upper and lower respiratory specimens during the acute phase of infection. The lowest concentration of SARS-CoV-2 viral copies this assay can detect is 250 copies / mL. A negative result does not preclude SARS-CoV-2 infection and should not be used as the sole basis for treatment or other patient management decisions.  A negative result may occur with improper specimen collection / handling, submission of specimen other than nasopharyngeal swab, presence of viral mutation(s) within the areas targeted by this assay, and inadequate number of viral copies (<250 copies / mL). A negative result must be combined with clinical observations, patient history, and epidemiological information.  Fact Sheet for Patients:   StrictlyIdeas.no  Fact Sheet for Healthcare Providers: BankingDealers.co.za  This test is not yet approved or  cleared by the [place] FDA and has been authorized for detection and/or diagnosis of SARS-CoV-2 by FDA under an Emergency Use Authorization (EUA).  This EUA will remain in effect (meaning this test can be used) for the duration of the COVID-19 declaration under Section 564(b)(1) of the Act, 21 U.S.C. section 360bbb-3(b)(1), unless the authorization is terminated or revoked sooner.  Performed at  [facility], [place] [address], [place], [place] [address]      Labs: Basic Metabolic Panel: Recent Labs  Lab 07/23/20 1415 07/23/20 1706 07/24/20 0930 07/25/20 0309  NA 141 142 140 137  K 4.1 3.7 3.9 4.1  CL 104  --  101 101  CO2 28  --  28 29  GLUCOSE 107*  --  221* 144*  BUN 13  --  13 21  CREATININE 0.67  --  0.67 0.83  CALCIUM 9.2  --  9.5 9.3   Liver Function Tests: Recent Labs  Lab 07/23/20 1415  AST 21  ALT 13  ALKPHOS 84  BILITOT 0.6  PROT 6.9  ALBUMIN 4.3   No results for input(s): LIPASE, AMYLASE in the last 168 hours. No results for input(s): AMMONIA in the last 168 hours. CBC: Recent Labs  Lab 07/23/20 1415 07/23/20 1706 07/24/20 0930 07/25/20 0309  WBC 7.7  --  7.9 12.5*  NEUTROABS 5.4  --   --  11.6*  HGB 11.8* 13.6 11.2* 10.1*  HCT 40.9 40.0 38.6 34.1*  MCV 90.3  --  90.0 88.1  PLT 341  --  311 318   Cardiac Enzymes: No results for input(s): CKTOTAL, CKMB, CKMBINDEX, TROPONINI in the last 168 hours. BNP: BNP (last 3 results) Recent Labs    07/23/20 1415  BNP 78.8    ProBNP (last 3 results) No results for input(s): PROBNP in the last 8760 hours.  CBG: No results for input(s): GLUCAP in the last 168 hours.  Active Problems:   Acute respiratory failure with hypoxia (HCC)   COPD exacerbation (HCC)   Essential hypertension   Hypoxemia   Time coordinating discharge: 38 minutes.  Signed:        Loyce Flaming, DO [facility]  07/27/2020, 6:36 PM

## 2020-07-28 NOTE — Telephone Encounter (Signed)
Pt seen 8/26 -pr

## 2020-08-03 ENCOUNTER — Emergency Department (HOSPITAL_BASED_OUTPATIENT_CLINIC_OR_DEPARTMENT_OTHER)
Admission: EM | Admit: 2020-08-03 | Discharge: 2020-08-04 | Disposition: A | Discharge status: Home / Self Care | Payer: Medicare Other | Attending: Emergency Medicine | Admitting: Emergency Medicine

## 2020-08-03 ENCOUNTER — Encounter (HOSPITAL_BASED_OUTPATIENT_CLINIC_OR_DEPARTMENT_OTHER): Payer: Self-pay

## 2020-08-03 ENCOUNTER — Emergency Department (HOSPITAL_BASED_OUTPATIENT_CLINIC_OR_DEPARTMENT_OTHER): Payer: Medicare Other

## 2020-08-03 ENCOUNTER — Other Ambulatory Visit: Payer: Self-pay

## 2020-08-03 DIAGNOSIS — Z041 Encounter for examination and observation following transport accident: Secondary | ICD-10-CM | POA: Diagnosis not present

## 2020-08-03 DIAGNOSIS — Y9389 Activity, other specified: Secondary | ICD-10-CM | POA: Insufficient documentation

## 2020-08-03 DIAGNOSIS — Y92410 Unspecified street and highway as the place of occurrence of the external cause: Secondary | ICD-10-CM | POA: Diagnosis not present

## 2020-08-03 DIAGNOSIS — I10 Essential (primary) hypertension: Secondary | ICD-10-CM | POA: Diagnosis not present

## 2020-08-03 DIAGNOSIS — M545 Low back pain: Secondary | ICD-10-CM | POA: Insufficient documentation

## 2020-08-03 DIAGNOSIS — J441 Chronic obstructive pulmonary disease with (acute) exacerbation: Secondary | ICD-10-CM | POA: Insufficient documentation

## 2020-08-03 DIAGNOSIS — Y999 Unspecified external cause status: Secondary | ICD-10-CM | POA: Diagnosis not present

## 2020-08-03 DIAGNOSIS — Z87891 Personal history of nicotine dependence: Secondary | ICD-10-CM | POA: Insufficient documentation

## 2020-08-03 HISTORY — DX: Chronic obstructive pulmonary disease, unspecified: J44.9

## 2020-08-03 MED ORDER — DICLOFENAC SODIUM 1 % EX GEL: 2.0000 g | Freq: Four times a day (QID) | CUTANEOUS | 0 refills | Status: DC | PRN
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
## 2020-08-31 ENCOUNTER — Telehealth: Payer: Self-pay | Admitting: Pulmonary Disease

## 2020-08-31 ENCOUNTER — Other Ambulatory Visit: Payer: Self-pay | Admitting: Adult Health

## 2020-08-31 DIAGNOSIS — U071 COVID-19: Secondary | ICD-10-CM

## 2020-08-31 NOTE — Progress Notes (Signed)
I connected by phone with Chloe Wilcox on 08/31/2020 at 5:02 PM to discuss the potential use of a new treatment for mild to moderate COVID-19 viral infection in non-hospitalized patients.  This patient is a 85 y.o. female that meets the FDA criteria for Emergency Use Authorization of COVID monoclonal antibody casirivimab/imdevimab or bamlanivimab/eteseviamb.  Has a (+) direct SARS-CoV-2 viral test result  Has mild or moderate COVID-19   Is NOT hospitalized due to COVID-19  Is within 10 days of symptom onset  Has at least one of the high risk factor(s) for progression to severe COVID-19 and/or hospitalization as defined in EUA.  Specific high risk criteria : Older age (>/= 83 yo)   I have spoken and communicated the following to the patient or parent/caregiver regarding COVID monoclonal antibody treatment:  1. FDA has authorized the emergency use for the treatment of mild to moderate COVID-19 in adults and pediatric patients with positive results of direct SARS-CoV-2 viral testing who are 30 years of age and older weighing at least 40 kg, and who are at high risk for progressing to severe COVID-19 and/or hospitalization.  2. The significant known and potential risks and benefits of COVID monoclonal antibody, and the extent to which such potential risks and benefits are unknown.  3. Information on available alternative treatments and the risks and benefits of those alternatives, including clinical trials.  4. Patients treated with COVID monoclonal antibody should continue to self-isolate and use infection control measures (e.g., wear mask, isolate, social distance, avoid sharing personal items, clean and disinfect "high touch" surfaces, and frequent handwashing) according to CDC guidelines.   5. The patient or parent/caregiver has the option to accept or refuse COVID monoclonal antibody treatment.  After reviewing this information with the patient, the patient has agreed to receive one of  the available covid 19 monoclonal antibodies and will be provided an appropriate fact sheet prior to infusion.  Set up for 10/5 at 1530.   Rexene Edison, NP 08/31/2020 5:02 PM

## 2020-08-31 NOTE — Telephone Encounter (Signed)
Spoke with the pt's daughter  She states that the pt was dx with Covid 5 days ago  She is not having any symptoms at this time  She did have both covid vaccines  Daughter is asking if she should get the infusion done  I am not sure if she qualifies- Tammy, can you please advise?

## 2020-08-31 NOTE — Telephone Encounter (Signed)
Discussed with family/paitent wants to get MAB . Sx of cough /dyspnea.  Sx onset 9/30  High risk -COPD and O2 .   See orders only for MAB

## 2020-09-01 ENCOUNTER — Ambulatory Visit (HOSPITAL_COMMUNITY)
Admission: RE | Admit: 2020-09-01 | Discharge: 2020-09-01 | Disposition: A | Discharge status: Home / Self Care | Payer: Medicare Other | Source: Ambulatory Visit | Attending: Pulmonary Disease | Admitting: Pulmonary Disease

## 2020-09-01 DIAGNOSIS — Z23 Encounter for immunization: Secondary | ICD-10-CM | POA: Diagnosis not present

## 2020-09-01 DIAGNOSIS — U071 COVID-19: Secondary | ICD-10-CM | POA: Diagnosis present

## 2020-09-01 MED ORDER — ALBUTEROL SULFATE HFA 108 (90 BASE) MCG/ACT IN AERS: 2.0000 | INHALATION_SPRAY | Freq: Once | RESPIRATORY_TRACT | Status: DC | PRN

## 2020-09-01 MED ORDER — FAMOTIDINE IN NACL 20-0.9 MG/50ML-% IV SOLN: 20.0000 mg | Freq: Once | INTRAVENOUS | Status: DC | PRN

## 2020-09-01 MED ORDER — METHYLPREDNISOLONE SODIUM SUCC 125 MG IJ SOLR: 125.0000 mg | Freq: Once | INTRAMUSCULAR | Status: DC | PRN

## 2020-09-01 MED ORDER — SODIUM CHLORIDE 0.9 % IV SOLN
1200.0000 mg | Freq: Once | INTRAVENOUS | Status: AC
  Administered 2020-09-01: 1200 mg via INTRAVENOUS

## 2020-09-01 MED ORDER — DIPHENHYDRAMINE HCL 50 MG/ML IJ SOLN: 50.0000 mg | Freq: Once | INTRAMUSCULAR | Status: DC | PRN

## 2020-09-01 MED ORDER — EPINEPHRINE 0.3 MG/0.3ML IJ SOAJ: 0.3000 mg | Freq: Once | INTRAMUSCULAR | Status: DC | PRN

## 2020-09-01 MED ORDER — SODIUM CHLORIDE 0.9 % IV SOLN: INTRAVENOUS | Status: DC | PRN

## 2020-09-01 NOTE — Progress Notes (Signed)
  Diagnosis: COVID-19  Physician:dr patrick wright  Procedure: Covid Infusion Clinic Med: casirivimab\imdevimab infusion - Provided patient with casirivimab\imdevimab fact sheet for patients, parents and caregivers prior to infusion.  Complications: No immediate complications noted.  Discharge: Discharged home   Heide Scales 09/01/2020

## 2020-09-01 NOTE — Discharge Instructions (Signed)

## 2020-09-03 ENCOUNTER — Emergency Department (HOSPITAL_COMMUNITY): Payer: Medicare Other

## 2020-09-03 ENCOUNTER — Telehealth: Payer: Self-pay | Admitting: Pulmonary Disease

## 2020-09-03 ENCOUNTER — Encounter (HOSPITAL_COMMUNITY): Payer: Self-pay

## 2020-09-03 ENCOUNTER — Emergency Department (HOSPITAL_COMMUNITY)
Admission: EM | Admit: 2020-09-03 | Discharge: 2020-09-03 | Disposition: A | Discharge status: Home / Self Care | Payer: Medicare Other | Attending: Emergency Medicine | Admitting: Emergency Medicine

## 2020-09-03 ENCOUNTER — Other Ambulatory Visit: Payer: Self-pay

## 2020-09-03 DIAGNOSIS — J441 Chronic obstructive pulmonary disease with (acute) exacerbation: Secondary | ICD-10-CM | POA: Insufficient documentation

## 2020-09-03 DIAGNOSIS — Z7982 Long term (current) use of aspirin: Secondary | ICD-10-CM | POA: Insufficient documentation

## 2020-09-03 DIAGNOSIS — Z79899 Other long term (current) drug therapy: Secondary | ICD-10-CM | POA: Insufficient documentation

## 2020-09-03 DIAGNOSIS — Z87891 Personal history of nicotine dependence: Secondary | ICD-10-CM | POA: Diagnosis not present

## 2020-09-03 DIAGNOSIS — U071 COVID-19: Secondary | ICD-10-CM | POA: Diagnosis not present

## 2020-09-03 DIAGNOSIS — I10 Essential (primary) hypertension: Secondary | ICD-10-CM | POA: Diagnosis not present

## 2020-09-03 DIAGNOSIS — R0602 Shortness of breath: Secondary | ICD-10-CM | POA: Diagnosis present

## 2020-09-03 LAB — COMPREHENSIVE METABOLIC PANEL
ALT: 15 U/L (ref 0–44)
AST: 20 U/L (ref 15–41)
Albumin: 4.2 g/dL (ref 3.5–5.0)
Alkaline Phosphatase: 77 U/L (ref 38–126)
Anion gap: 8 (ref 5–15)
BUN: 8 mg/dL (ref 8–23)
CO2: 35 mmol/L — ABNORMAL HIGH (ref 22–32)
Calcium: 9.1 mg/dL (ref 8.9–10.3)
Chloride: 99 mmol/L (ref 98–111)
Creatinine, Ser: 0.49 mg/dL (ref 0.44–1.00)
GFR calc non Af Amer: >60 mL/min (ref >60)
Glucose, Bld: 103 mg/dL — ABNORMAL HIGH (ref 70–99)
Potassium: 3.4 mmol/L — ABNORMAL LOW (ref 3.5–5.1)
Sodium: 142 mmol/L (ref 135–145)
Total Bilirubin: 0.8 mg/dL (ref 0.3–1.2)
Total Protein: 6.9 g/dL (ref 6.5–8.1)

## 2020-09-03 LAB — CBC WITH DIFFERENTIAL/PLATELET
Abs Immature Granulocytes: 0.03 10*3/uL (ref 0.00–0.07)
Basophils Absolute: 0.1 10*3/uL (ref 0.0–0.1)
Basophils Relative: 1 %
Eosinophils Absolute: 0.7 10*3/uL — ABNORMAL HIGH (ref 0.0–0.5)
Eosinophils Relative: 10 %
HCT: 37.4 % (ref 36.0–46.0)
Hemoglobin: 11.3 g/dL — ABNORMAL LOW (ref 12.0–15.0)
Immature Granulocytes: 0 %
Lymphocytes Relative: 11 %
Lymphs Abs: 0.8 10*3/uL (ref 0.7–4.0)
MCH: 27.2 pg (ref 26.0–34.0)
MCHC: 30.2 g/dL (ref 30.0–36.0)
MCV: 89.9 fL (ref 80.0–100.0)
Monocytes Absolute: 0.4 10*3/uL (ref 0.1–1.0)
Monocytes Relative: 6 %
Neutro Abs: 5 10*3/uL (ref 1.7–7.7)
Neutrophils Relative %: 72 %
Platelets: 368 10*3/uL (ref 150–400)
RBC: 4.16 MIL/uL (ref 3.87–5.11)
RDW: 16.2 % — ABNORMAL HIGH (ref 11.5–15.5)
WBC: 7 10*3/uL (ref 4.0–10.5)
nRBC: 0 % (ref 0.0–0.2)

## 2020-09-03 LAB — RESPIRATORY PANEL BY RT PCR (FLU A&B, COVID)
Influenza A by PCR: NEGATIVE
Influenza B by PCR: NEGATIVE
SARS Coronavirus 2 by RT PCR: POSITIVE — AB

## 2020-09-03 LAB — TRIGLYCERIDES: Triglycerides: 31 mg/dL (ref <150)

## 2020-09-03 LAB — FIBRINOGEN: Fibrinogen: 451 mg/dL (ref 210–475)

## 2020-09-03 LAB — LACTIC ACID, PLASMA: Lactic Acid, Venous: 1.3 mmol/L (ref 0.5–1.9)

## 2020-09-03 LAB — C-REACTIVE PROTEIN: CRP: 0.5 mg/dL (ref <1.0)

## 2020-09-03 LAB — LACTATE DEHYDROGENASE: LDH: 189 U/L (ref 98–192)

## 2020-09-03 LAB — D-DIMER, QUANTITATIVE: D-Dimer, Quant: 1.72 ug/mL-FEU — ABNORMAL HIGH (ref 0.00–0.50)

## 2020-09-03 LAB — FERRITIN: Ferritin: 41 ng/mL (ref 11–307)

## 2020-09-03 LAB — PROCALCITONIN: Procalcitonin: <0.1 ng/mL

## 2020-09-03 MED ORDER — SODIUM CHLORIDE (PF) 0.9 % IJ SOLN
INTRAMUSCULAR | Status: AC
  Filled 2020-09-03: qty 50

## 2020-09-03 MED ORDER — METHYLPREDNISOLONE SODIUM SUCC 125 MG IJ SOLR
125.0000 mg | Freq: Once | INTRAMUSCULAR | Status: AC
  Administered 2020-09-03: 125 mg via INTRAVENOUS
  Filled 2020-09-03: qty 2

## 2020-09-03 MED ORDER — IOHEXOL 350 MG/ML SOLN
100.0000 mL | Freq: Once | INTRAVENOUS | Status: AC | PRN
  Administered 2020-09-03: 69 mL via INTRAVENOUS

## 2020-09-03 MED ORDER — PREDNISONE 50 MG PO TABS: ORAL_TABLET | ORAL | 0 refills | Status: DC

## 2020-09-03 MED ORDER — SODIUM CHLORIDE 0.9 % IV SOLN
1000.0000 mL | INTRAVENOUS | Status: DC
  Administered 2020-09-03: 1000 mL via INTRAVENOUS

## 2020-09-03 NOTE — ED Triage Notes (Signed)
Pt arrived via EMS. Covid + 9/30 with infusion for covid on 10/5. Family insisted on patient coming to ED due to pulmonologist saying she has copd and needs to see MD.

## 2020-09-03 NOTE — Telephone Encounter (Signed)
Spoke with pt's daughter and granddaughter. Pt had the monoclonal antibodies infusion on Tuesday for COVID. Last night the pt woke her granddaughter up at 0400 complaining of being "hot". Pt's granddaughter states that the pt became very anxious and stated that she could not breath. Pt was given a nebulizer treatment and this helped with her breathing. After the treatment, pt calmed down and everything got better. Pt's family is just concerned and is wondering if this could have been from the infusion.  Tammy - please advise. Thanks.

## 2020-09-03 NOTE — Telephone Encounter (Signed)
Called patient, EMS at house. Breathing getting worse despite Neb tx.  Rec: ER evaluation  Please contact office for sooner follow up if symptoms do not improve or worsen or seek emergency care

## 2020-09-03 NOTE — ED Provider Notes (Signed)
[facility] DEPT Provider Note   CSN: [id number] Arrival date & time: 09/03/20  1135     History Chief Complaint  Patient presents with  . Shortness of Breath    Chloe Wilcox is a 85 y.o. female.  85 year old female with history of COPD on home oxygen at 2 L presents short of breath since this morning.  Recent diagnosis of Covid and received MAB treatment 2 days ago.  This morning she became more dyspneic.  Received albuterol treatment which did help slightly.  Denies any vomiting or diarrhea.  Denies any chest pain or chest pressure.  No leg pain or swelling.  Presents via EMS        Past Medical History:  Diagnosis Date  . COPD (chronic obstructive pulmonary disease) ([place])   . Depression   . Hypertension     Patient Active Problem List   Diagnosis Date Noted  . Hypoxemia 07/23/2020  . Respiratory failure ([place]) 06/15/2019  . Acute respiratory failure with hypoxia ([place]) 06/14/2019  . COPD exacerbation ([place]) 06/14/2019  . COPD with acute bronchitis ([place]) 06/14/2019  . Hypokalemia 06/14/2019  . Essential hypertension 06/14/2019    Past Surgical History:  Procedure Laterality Date  . VAGINAL HYSTERECTOMY       OB History   No obstetric history on file.     Family History  Family history unknown: Yes    Social History   Tobacco Use  . Smoking status: Former Research scientist (life sciences)  . Smokeless tobacco: Never Used  . Tobacco comment: quit smoking 30 years ago  Substance Use Topics  . Alcohol use: No  . Drug use: No    Home Medications Prior to Admission medications   Medication Sig Start Date End Date Taking? Authorizing Provider  acetaminophen (TYLENOL) 650 MG CR tablet Take 650 mg by mouth every 8 (eight) hours as needed for pain (or headaches).     [provider]  albuterol (PROAIR HFA) 108 (90 Base) MCG/ACT inhaler Inhale 2 puffs into the lungs every 6 (six) hours as needed for wheezing or shortness of breath.    [provider]  albuterol (VENTOLIN HFA) 108 (90 Base) MCG/ACT inhaler Inhale 2 puffs into the lungs every 6 (six) hours as needed for wheezing or shortness of breath. Patient not taking: Reported on 07/23/2020 01/01/20   June Leap L, DO  amLODipine (NORVASC) 10 MG tablet Take 10 mg by mouth daily as needed (IF SYSTOLIC NUMBER IS AT LEAST 100 OR GREATER).  05/21/19   [provider]  aspirin 325 MG tablet Take 325 mg by mouth daily.    [provider]  atorvastatin (LIPITOR) 40 MG tablet Take 40 mg by mouth at bedtime.    [provider]  citalopram (CELEXA) 20 MG tablet Take 20 mg by mouth daily.    [provider]  diclofenac (VOLTAREN) 50 MG EC tablet Take 50 mg by mouth 2 (two) times daily.    [provider]  diclofenac Sodium (VOLTAREN) 1 % GEL Apply 2 g topically 4 (four) times daily as needed. 08/03/20   Long, Wonda Olds, MD  furosemide (LASIX) 20 MG tablet Take 20-40 mg by mouth See admin instructions. Take 40 mg by mouth in the morning and 20 mg at bedtime 11/25/19   [provider]  gabapentin (NEURONTIN) 100 MG capsule Take 1 capsule (100 mg total) by mouth 2 (two) times daily. 07/27/20   Swayze, Ava, DO  ipratropium-albuterol (DUONEB) 0.5-2.5 (3) MG/3ML  SOLN Take 3 mLs by nebulization 2 (two) times daily. 07/27/20   Swayze, Ava, DO  Multiple Vitamin (MULTIVITAMIN) tablet Take 1 tablet by mouth daily.    [provider]  olmesartan (BENICAR) 20 MG tablet Take 10 mg by mouth at bedtime.     [provider]  tiZANidine (ZANAFLEX) 2 MG tablet Take 2 mg by mouth every 8 (eight) hours as needed for muscle spasms.  12/02/19   [provider]  traMADol (ULTRAM) 50 MG tablet Take 50 mg by mouth daily as needed for moderate pain.    [provider]  umeclidinium-vilanterol (ANORO ELLIPTA) 62.5-25 MCG/INH AEPB Inhale 1 puff into the lungs daily. 01/01/20   Garner Nash, DO    Allergies    Patient has no known  allergies.  Review of Systems   Review of Systems  All other systems reviewed and are negative.   Physical Exam Updated Vital Signs SpO2 99%   Physical Exam Vitals and nursing note reviewed.  Constitutional:      General: She is not in acute distress.    Appearance: Normal appearance. She is well-developed. She is not toxic-appearing.  HENT:     Head: Normocephalic and atraumatic.  Eyes:     General: Lids are normal.     Conjunctiva/sclera: Conjunctivae normal.     Pupils: Pupils are equal, round, and reactive to light.  Neck:     Thyroid: No thyroid mass.     Trachea: No tracheal deviation.  Cardiovascular:     Rate and Rhythm: Normal rate and regular rhythm.     Heart sounds: Normal heart sounds. No murmur heard.  No gallop.   Pulmonary:     Effort: Pulmonary effort is normal. No respiratory distress.     Breath sounds: No stridor. Decreased breath sounds present. No wheezing, rhonchi or rales.  Abdominal:     General: Bowel sounds are normal. There is no distension.     Palpations: Abdomen is soft.     Tenderness: There is no abdominal tenderness. There is no rebound.  Musculoskeletal:        General: No tenderness. Normal range of motion.     Cervical back: Normal range of motion and neck supple.  Skin:    General: Skin is warm and dry.     Findings: No abrasion or rash.  Neurological:     Mental Status: She is alert and oriented to person, place, and time.     GCS: GCS eye subscore is 4. GCS verbal subscore is 5. GCS motor subscore is 6.     Cranial Nerves: No cranial nerve deficit.     Sensory: No sensory deficit.  Psychiatric:        Speech: Speech normal.        Behavior: Behavior normal.     ED Results / Procedures / Treatments   Labs (all labs ordered are listed, but only abnormal results are displayed) Labs Reviewed  RESPIRATORY PANEL BY RT PCR (FLU A&B, COVID)  CULTURE, BLOOD (ROUTINE X 2)  CULTURE, BLOOD (ROUTINE X 2)  LACTIC ACID, PLASMA    LACTIC ACID, PLASMA  CBC WITH DIFFERENTIAL/PLATELET  COMPREHENSIVE METABOLIC PANEL  D-DIMER, QUANTITATIVE (NOT AT [facility])  PROCALCITONIN  LACTATE DEHYDROGENASE  FERRITIN  TRIGLYCERIDES  FIBRINOGEN  C-REACTIVE PROTEIN    EKG None  Radiology No results found.  Procedures Procedures (including critical care time)  Medications Ordered in ED Medications  0.9 %  sodium chloride infusion (has no administration  in time range)    ED Course  I have reviewed the triage vital signs and the nursing notes.  Pertinent labs & imaging results that were available during my care of the patient were reviewed by me and considered in my medical decision making (see chart for details).    MDM Rules/Calculators/A&P                         Patient chronically on home oxygen.  Suspect COPD exacerbation today.  She is resting comfortably at this time on her normal regimen.  Will give Solu-Medrol here.  Patient has home nebulizer and will place on prednisone burst.  Patient comfortable with going home as she has home oxygen as well as home nebulizer treatments. Patient with negative CT chest for PE. Final Clinical Impression(s) / ED Diagnoses Final diagnoses:  None    Rx / DC Orders ED Discharge Orders    None       Lacretia Leigh, MD 09/03/20 1450

## 2020-09-03 NOTE — ED Notes (Signed)
Daughter called and asked what pharmacy medication called in to.

## 2020-09-04 NOTE — Telephone Encounter (Signed)
Agree with ED evaluation Garner Nash, DO [facility] 09/04/2020 4:45 AM

## 2020-09-07 NOTE — Telephone Encounter (Signed)
Error

## 2020-09-08 LAB — CULTURE, BLOOD (ROUTINE X 2)
Culture: NO GROWTH
Culture: NO GROWTH
Special Requests: ADEQUATE

## 2020-09-14 ENCOUNTER — Other Ambulatory Visit: Payer: Self-pay

## 2020-09-14 ENCOUNTER — Encounter: Payer: Self-pay | Admitting: Pulmonary Disease

## 2020-09-14 ENCOUNTER — Ambulatory Visit (INDEPENDENT_AMBULATORY_CARE_PROVIDER_SITE_OTHER): Payer: Medicare Other | Admitting: Pulmonary Disease

## 2020-09-14 VITALS — BP 138/62 | HR 90 | Temp 97.0°F | Ht 63.78 in | Wt 136.2 lb

## 2020-09-14 DIAGNOSIS — R918 Other nonspecific abnormal finding of lung field: Secondary | ICD-10-CM

## 2020-09-14 DIAGNOSIS — J440 Chronic obstructive pulmonary disease with acute lower respiratory infection: Secondary | ICD-10-CM

## 2020-09-14 DIAGNOSIS — Z8616 Personal history of COVID-19: Secondary | ICD-10-CM | POA: Diagnosis not present

## 2020-09-14 DIAGNOSIS — J209 Acute bronchitis, unspecified: Secondary | ICD-10-CM

## 2020-09-14 DIAGNOSIS — J9611 Chronic respiratory failure with hypoxia: Secondary | ICD-10-CM

## 2020-09-14 MED ORDER — BREZTRI AEROSPHERE 160-9-4.8 MCG/ACT IN AERO: 2.0000 | INHALATION_SPRAY | Freq: Two times a day (BID) | RESPIRATORY_TRACT | 0 refills | Status: DC
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
## 2020-09-14 NOTE — Assessment & Plan Note (Signed)
Plan: Continue oxygen therapy at this time 6-week follow-up with Dr. Valeta Harms

## 2020-09-14 NOTE — Assessment & Plan Note (Signed)
08/27/2020-SARS-CoV-2 2-positive Patient vaccinated against COVID-19 Patient received monoclonal antibody infusion on 09/01/2020  Plan: 6-week follow-up with our office We will continue to monitor clinically May need to consider attempting to repeat pulmonary function testing in the future

## 2020-09-14 NOTE — Assessment & Plan Note (Signed)
33-pack-year smoking history Patient has been unable to complete pulmonary function testing Patient with known emphysema on CT imaging Patient currently maintained on Anoro Ellipta, patient reports adherence with unsure of efficacy of medication  Plan: Trial of Breztri  Stop Anoro Ellipta Follow-up with our office in 4 to 6 weeks with Dr. Valeta Harms May need to consider attempting pulmonary function testing again

## 2020-09-14 NOTE — Assessment & Plan Note (Signed)
4 mm pulmonary nodule seen on most recent CT imaging  Plan: We will repeat CT of chest in October/2022

## 2020-09-14 NOTE — Patient Instructions (Signed)
You were seen today by Lauraine Rinne, NP  for:   1. COPD with acute bronchitis (HCC)  Trial of Breztri >>> 2 puffs in the morning right when you wake up, rinse out your mouth after use, 12 hours later 2 puffs, rinse after use >>> Take this daily, no matter what >>> This is not a rescue inhaler  >>> Use spacer provided today  Notify our office in 2 weeks how you are doing on this inhaler.  If you like it then we can send in a long-term prescription  Please stop Anoro Ellipta at this time   Only use your albuterol as a rescue medication to be used if you can't catch your breath by resting or doing a relaxed purse lip breathing pattern.  - The less you use it, the better it will work when you need it. - Ok to use up to 2 puffs  every 4 hours if you must but call for immediate appointment if use goes up over your usual need - Don't leave home without it !!  (think of it like the spare tire for your car)   Note your daily symptoms > remember "red flags" for COPD:   >>>Increase in cough >>>increase in sputum production >>>increase in shortness of breath or activity  intolerance.   If you notice these symptoms, please call the office to be seen.     2. Chronic respiratory failure with hypoxia (HCC)  Continue oxygen therapy as prescribed  >>>maintain oxygen saturations greater than 88 percent  >>>if unable to maintain oxygen saturations please contact the office  >>>do not smoke with oxygen  >>>can use nasal saline gel or nasal saline rinses to moisturize nose if oxygen causes dryness  3. History of COVID-19  At next office visit we can consider repeating a chest x-ray.  4.  Right upper lobe pulmonary nodule  We will repeat a CT of your chest in October/2022   We recommend today:   Meds ordered this encounter  Medications  . Budeson-Glycopyrrol-Formoterol (BREZTRI AEROSPHERE) 160-9-4.8 MCG/ACT AERO    Sig: Inhale 2 puffs into the lungs 2 (two) times daily.    Dispense:   10.7 g    Refill:  0    Order Specific Question:   Lot Number?    Answer:   [id number]    Order Specific Question:   Expiration Date?    Answer:   01/26/2022    Order Specific Question:   Manufacturer?    Answer:   [employer] [[id number]]    Order Specific Question:   Quantity    Answer:   4    Follow Up:    Return in about 6 weeks (around 10/26/2020), or if symptoms worsen or fail to improve, for Follow up with Dr. Valeta Harms.   Notification of test results are managed in the following manner: If there are  any recommendations or changes to the  plan of care discussed in office today,  we will contact you and let you know what they are. If you do not hear from [place], then your results are normal and you can view them through your  MyChart account , or a letter will be sent to you. Thank you again for trusting [place] with your care  - Thank you, [facility]    It is flu season:   >>> Best ways to protect herself from the flu: Receive the yearly flu vaccine, practice good hand hygiene washing with soap and also using hand  sanitizer when available, eat a nutritious meals, get adequate rest, hydrate appropriately       Please contact the office if your symptoms worsen or you have concerns that you are not improving.   Thank you for choosing [facility] for your healthcare, and for allowing [place] to partner with you on your healthcare journey. I am thankful to be able to provide care to you today.   Wyn Quaker FNP-C

## 2020-09-16 ENCOUNTER — Telehealth: Payer: Self-pay | Admitting: Pulmonary Disease

## 2020-09-16 NOTE — Telephone Encounter (Signed)
Called and spoke to patient's daughter, [name](DPR). [name] is requesting Rx for mucinex 600mg . I have made [name] aware that this medication can be purchased OTC.  [name] voiced her understanding. Nothing further needed.

## 2020-09-18 NOTE — Progress Notes (Signed)
Thanks for seeing her.  Garner Nash, DO [facility] 09/18/2020 2:40 PM

## 2020-10-06 ENCOUNTER — Telehealth: Payer: Self-pay | Admitting: Pulmonary Disease

## 2020-10-06 MED ORDER — BREZTRI AEROSPHERE 160-9-4.8 MCG/ACT IN AERO: 2.0000 | INHALATION_SPRAY | Freq: Two times a day (BID) | RESPIRATORY_TRACT | 5 refills | Status: DC

## 2020-10-06 NOTE — Telephone Encounter (Signed)
Spoke with daughter [name] (dpr on file), states pt is doing well on Breztri samples provided and would like rx sent to pharmacy.  This has been sent as requested.  Nothing further needed at this time- will close encounter.

## 2020-10-15 ENCOUNTER — Other Ambulatory Visit: Payer: Self-pay

## 2020-10-15 ENCOUNTER — Encounter: Payer: Self-pay | Admitting: Pulmonary Disease

## 2020-10-15 ENCOUNTER — Ambulatory Visit: Payer: Medicare Other | Admitting: Pulmonary Disease

## 2020-10-15 VITALS — BP 122/62 | HR 71 | Temp 97.7°F | Wt 139.1 lb

## 2020-10-15 DIAGNOSIS — Z9981 Dependence on supplemental oxygen: Secondary | ICD-10-CM | POA: Diagnosis not present

## 2020-10-15 DIAGNOSIS — R911 Solitary pulmonary nodule: Secondary | ICD-10-CM

## 2020-10-15 DIAGNOSIS — J9611 Chronic respiratory failure with hypoxia: Secondary | ICD-10-CM | POA: Diagnosis not present

## 2020-10-15 DIAGNOSIS — J441 Chronic obstructive pulmonary disease with (acute) exacerbation: Secondary | ICD-10-CM

## 2020-10-15 DIAGNOSIS — Z87891 Personal history of nicotine dependence: Secondary | ICD-10-CM

## 2020-10-15 DIAGNOSIS — Z7185 Encounter for immunization safety counseling: Secondary | ICD-10-CM

## 2020-10-15 DIAGNOSIS — Z8616 Personal history of COVID-19: Secondary | ICD-10-CM | POA: Diagnosis not present

## 2020-10-15 MED ORDER — BREZTRI AEROSPHERE 160-9-4.8 MCG/ACT IN AERO
2.0000 | INHALATION_SPRAY | Freq: Two times a day (BID) | RESPIRATORY_TRACT | 5 refills | Status: DC
Start: 2020-10-15 — End: 2021-02-10

## 2020-10-15 MED ORDER — ALBUTEROL SULFATE HFA 108 (90 BASE) MCG/ACT IN AERS: 2.0000 | INHALATION_SPRAY | Freq: Four times a day (QID) | RESPIRATORY_TRACT | 11 refills | Status: DC | PRN

## 2020-10-15 NOTE — Progress Notes (Signed)
Synopsis: Referred in September 2020 for hypoxemia by [name], MD  Subjective:   PATIENT ID: Chloe Wilcox GENDER: female DOB: 06-23-1935, MRN: [id number]  Chief Complaint  Patient presents with  . Follow-up    This is a 85 year old female past medical history of depression and hypertension.  Recently hospitalized in July 2020.  She is a former smoker.  She had increased shortness of breath and cough with sputum production.  Diagnosed with possible COPD exacerbation.  She was found to be hypoxemic 85% on room air requiring 2 L nasal cannula.  She was treated with IV steroids and bronchodilators.  Patient slowly improved was discharged on June 16, 2019 home with home health.  Patient was still requiring oxygen and recommended outpatient pulmonary follow-up.  COVID testing on admission negative.  Patient had chest x-ray on admission with no infiltrate.  Echocardiogram with preserved ejection fraction, grade 1 diastolic dysfunction, moderate pulmonary hypertension RVSP 55.  OV 08/07/2019: Patient doing well today.  Has been using her oxygen at home as directed.  Wants to know if she can come off of this.  Recently diagnosed with enlarging 9.5 cm pelvic mass.  Seen by urogynecology at [facility].  They are making plans for surgical removal of this.  Looking for pulmonary risk stratification peri-op eval.  She is a former smoker.  Does have daily cough sputum production.  She is doing much better since her hospitalization.  Denies fevers chills night sweats weight loss.  Denies hemoptysis.  She has been using Spiriva HandiHaler plus Flovent.  Occasionally needing to use her albuterol.  She gets winded with short distances.  Has dyspnea on exertion.  OV 09/25/2019: Presents today for follow-up after pulmonary function test.  Patient attempted pulmonary function test today in the office and was unable to have this completed.  She had several attempts but had much difficulty with the coordination  of completing the pulmonary function test.  At this time she has not been using any of the oxygen available or needs at home.  Her O2 sats have been stable.  She is doing well with the Anoro Ellipta inhaler that was given to her.  Otherwise her respiratory symptoms are stable and have not had any change or decline since her last office visit.  She is still working on planning for the pelvic procedure.  For removal of the pelvic mass.  OV 01/01/2020: Patient seen today in the office for follow-up regarding her COPD.  This is a presumed diagnosis.  She was unable to have pulmonary function test that were completed or/attempted at her last office visit.  She has been seen by surgery with concern of a pelvic mass.  She states they have declined on removal of this.  She is no longer on oxygen for the patient.  Stated that she had not needed it any further.  Had several questions today as well as vaccine counseling regarding the Covid vaccine which she is scheduled to receive this Saturday.  OV 10/15/2020: Patient was seen in the office in August 2021 for his COPD exacerbation.  Also had monoclonal antibody infusion on 09/01/2020 after or Covid positive family exposure.  Patient was admitted to the emergency department for CAP exacerbation evaluation in September 03, 2020.  With follow-up in the office on September 14, 2020 and saw Wyn Quaker, NP.  She has a history of COVID-19.  Chronic respiratory failure on oxygen therapy.   Past Medical History:  Diagnosis Date  .  COPD (chronic obstructive pulmonary disease) ([place])   . Depression   . Hypertension      Family History  Family history unknown: Yes     Past Surgical History:  Procedure Laterality Date  . VAGINAL HYSTERECTOMY      Social History   Socioeconomic History  . Marital status: Married    Spouse name: Not on file  . Number of children: Not on file  . Years of education: Not on file  . Highest education level: Not on file  Occupational  History  . Not on file  Tobacco Use  . Smoking status: Former Smoker    Packs/day: 1.00    Years: 33.00    Pack years: 33.00    Start date: 11/28/1950    Quit date: 11/29/1983    Years since quitting: 36.9  . Smokeless tobacco: Never Used  Vaping Use  . Vaping Use: Never used  Substance and Sexual Activity  . Alcohol use: No  . Drug use: No  . Sexual activity: Not on file  Other Topics Concern  . Not on file  Social History Narrative  . Not on file   Social Determinants of Health   Financial Resource Strain:   . Difficulty of Paying Living Expenses: Not on file  Food Insecurity:   . Worried About Charity fundraiser in the Last Year: Not on file  . Ran Out of Food in the Last Year: Not on file  Transportation Needs:   . Lack of Transportation (Medical): Not on file  . Lack of Transportation (Non-Medical): Not on file  Physical Activity:   . Days of Exercise per Week: Not on file  . Minutes of Exercise per Session: Not on file  Stress:   . Feeling of Stress : Not on file  Social Connections:   . Frequency of Communication with Friends and Family: Not on file  . Frequency of Social Gatherings with Friends and Family: Not on file  . Attends Religious Services: Not on file  . Active Member of Clubs or Organizations: Not on file  . Attends Archivist Meetings: Not on file  . Marital Status: Not on file  Intimate Partner Violence:   . Fear of Current or Ex-Partner: Not on file  . Emotionally Abused: Not on file  . Physically Abused: Not on file  . Sexually Abused: Not on file     No Known Allergies   Outpatient Medications Prior to Visit  Medication Sig Dispense Refill  . acetaminophen (TYLENOL) 650 MG CR tablet Take 650 mg by mouth every 8 (eight) hours as needed for pain (or headaches).     Marland Kitchen albuterol (PROAIR HFA) 108 (90 Base) MCG/ACT inhaler Inhale 2 puffs into the lungs every 6 (six) hours as needed for wheezing or shortness of breath.    Marland Kitchen albuterol  (VENTOLIN HFA) 108 (90 Base) MCG/ACT inhaler Inhale 2 puffs into the lungs every 6 (six) hours as needed for wheezing or shortness of breath. 18 g 2  . amLODipine (NORVASC) 10 MG tablet Take 10 mg by mouth daily as needed (IF SYSTOLIC NUMBER IS AT LEAST 100 OR GREATER).     Marland Kitchen aspirin 325 MG tablet Take 325 mg by mouth daily.    Marland Kitchen atorvastatin (LIPITOR) 40 MG tablet Take 40 mg by mouth at bedtime.    . Budeson-Glycopyrrol-Formoterol (BREZTRI AEROSPHERE) 160-9-4.8 MCG/ACT AERO Inhale 2 puffs into the lungs in the morning and at bedtime. 10.7 g 5  . citalopram (  CELEXA) 20 MG tablet Take 20 mg by mouth daily.    . diclofenac (VOLTAREN) 50 MG EC tablet Take 50 mg by mouth 2 (two) times daily.    . diclofenac Sodium (VOLTAREN) 1 % GEL Apply 2 g topically 4 (four) times daily as needed. 50 g 0  . furosemide (LASIX) 20 MG tablet Take 20-40 mg by mouth See admin instructions. Take 40 mg by mouth in the morning and 20 mg at bedtime    . ipratropium-albuterol (DUONEB) 0.5-2.5 (3) MG/3ML SOLN Take 3 mLs by nebulization 2 (two) times daily. 360 mL 0  . Multiple Vitamin (MULTIVITAMIN) tablet Take 1 tablet by mouth daily.    Marland Kitchen olmesartan (BENICAR) 20 MG tablet Take 10 mg by mouth at bedtime.     . predniSONE (DELTASONE) 50 MG tablet 1 p.o. daily x5 5 tablet 0  . tiZANidine (ZANAFLEX) 2 MG tablet Take 2 mg by mouth every 8 (eight) hours as needed for muscle spasms.     . traMADol (ULTRAM) 50 MG tablet Take 50 mg by mouth daily as needed for moderate pain.    Marland Kitchen gabapentin (NEURONTIN) 100 MG capsule Take 1 capsule (100 mg total) by mouth 2 (two) times daily. (Patient not taking: Reported on 10/15/2020) 60 capsule 0  . umeclidinium-vilanterol (ANORO ELLIPTA) 62.5-25 MCG/INH AEPB Inhale 1 puff into the lungs daily. (Patient not taking: Reported on 10/15/2020) 1 each 12   No facility-administered medications prior to visit.    Review of Systems  Constitutional: Negative for chills, fever, malaise/fatigue and weight  loss.  HENT: Negative for hearing loss, sore throat and tinnitus.   Eyes: Negative for blurred vision and double vision.  Respiratory: Positive for cough and shortness of breath. Negative for hemoptysis, sputum production, wheezing and stridor.   Cardiovascular: Negative for chest pain, palpitations, orthopnea, leg swelling and PND.  Gastrointestinal: Negative for abdominal pain, constipation, diarrhea, heartburn, nausea and vomiting.  Genitourinary: Negative for dysuria, hematuria and urgency.  Musculoskeletal: Negative for joint pain and myalgias.  Skin: Negative for itching and rash.  Neurological: Negative for dizziness, tingling, weakness and headaches.  Endo/Heme/Allergies: Negative for environmental allergies. Does not bruise/bleed easily.  Psychiatric/Behavioral: Negative for depression. The patient is not nervous/anxious and does not have insomnia.   All other systems reviewed and are negative.    Objective:  Physical Exam Vitals reviewed.  Constitutional:      General: She is not in acute distress.    Appearance: She is well-developed.  HENT:     Head: Normocephalic and atraumatic.     Mouth/Throat:     Pharynx: No oropharyngeal exudate.  Eyes:     Conjunctiva/sclera: Conjunctivae normal.     Pupils: Pupils are equal, round, and reactive to light.  Neck:     Vascular: No JVD.     Trachea: No tracheal deviation.     Comments: Loss of supraclavicular fat Cardiovascular:     Rate and Rhythm: Normal rate and regular rhythm.     Heart sounds: S1 normal and S2 normal.     Comments: Distant heart tones Pulmonary:     Effort: No tachypnea or accessory muscle usage.     Breath sounds: No stridor. Decreased breath sounds (throughout all lung fields) present. No wheezing, rhonchi or rales.  Abdominal:     General: Bowel sounds are normal. There is no distension.     Palpations: Abdomen is soft.     Tenderness: There is no abdominal tenderness.  Musculoskeletal:  General: Deformity (muscle wasting ) present.  Skin:    General: Skin is warm and dry.     Capillary Refill: Capillary refill takes less than 2 seconds.     Findings: No rash.  Neurological:     Mental Status: She is alert and oriented to person, place, and time.  Psychiatric:        Behavior: Behavior normal.      Vitals:   10/15/20 1034  BP: 122/62  Pulse: 71  Temp: 97.7 F (36.5 C)  TempSrc: Tympanic  SpO2: 97%  Weight: 139 lb 2 oz (63.1 kg)   97% on room air BMI Readings from Last 3 Encounters:  10/15/20 24.05 kg/m  09/14/20 23.54 kg/m  09/03/20 25.77 kg/m   Wt Readings from Last 3 Encounters:  10/15/20 139 lb 2 oz (63.1 kg)  09/14/20 136 lb 3.2 oz (61.8 kg)  09/03/20 145 lb 8.1 oz (66 kg)     CBC    Component Value Date/Time   WBC 7.0 09/03/2020 1215   RBC 4.16 09/03/2020 1215   HGB 11.3 (L) 09/03/2020 1215   HCT 37.4 09/03/2020 1215   PLT 368 09/03/2020 1215   MCV 89.9 09/03/2020 1215   MCH 27.2 09/03/2020 1215   MCHC 30.2 09/03/2020 1215   RDW 16.2 (H) 09/03/2020 1215   LYMPHSABS 0.8 09/03/2020 1215   MONOABS 0.4 09/03/2020 1215   EOSABS 0.7 (H) 09/03/2020 1215   BASOSABS 0.1 09/03/2020 1215    Chest Imaging: Chest x-ray 06/14/2019: No infiltrate. The patient's images have been independently reviewed by me.    CT scan of the chest October 2021: New right upper lobe 4 mm pulmonary nodule.  Evidence of emphysema. The patient's images have been independently reviewed by me.    Pulmonary Functions Testing Results: No flowsheet data found.  FeNO: None   Pathology: None   Echocardiogram:  July 2020: Preserved ejection fraction 55 to 65%, grade 1 diastolic dysfunction, moderate pulmonary hypertension RVSP 55.  Heart Catheterization: None     Assessment & Plan:     ICD-10-CM   1. Chronic respiratory failure with hypoxia (HCC)  J96.11   2. History of COVID-19  Z86.16   3. COPD exacerbation ([place])  J44.1   4. Oxygen dependent  Z99.81     5. Former smoker  Z87.891   6. Right upper lobe pulmonary nodule  R91.1 CT Super D Chest Wo Contrast   62mm CT Chest October 2021  7. Vaccine counseling  Z71.85     Discussion:  85 year old female, former smoker quit in the 1980s, probable COPD unable to complete PFTs on prior office attempt.  I do think that repeating PFTs at this point does not make a lot of sense.  She likely has COPD and responsive to inhaler regimen.  Plan: Continue oxygen for hypoxemia, DME supply orders continue. History of COVID-19, she did receive her vaccines and unfortunately developed Covid and developed a COPD exacerbation because of it. She is recovered nicely from this continued observation. But we did discuss vaccine counseling today and I do think she needs her booster. Since she received a monoclonal antibody for COVID-19 which she will need to wait 90 days to receive her booster. She does have a new right upper lobe pulmonary nodule that is 4 mm in size with her smoking history she needs follow-up with [place].  A noncontrast CT of the chest was ordered for 41-month follow-up.  I spent 40 minutes dedicated to the care of this  patient on the date of this encounter to include pre-visit review of records, face-to-face time with the patient discussing conditions above, post visit ordering of testing, clinical documentation with the electronic health record, making appropriate referrals as documented, and communicating necessary findings to members of the patients care team.    Garner Nash, DO [facility] 10/15/2020 10:45 AM

## 2020-10-15 NOTE — Patient Instructions (Addendum)
Thank you for visiting Dr. Brienne Liguori at [facility]. Today we recommend the following: No orders of the defined types were placed in this encounter.  No orders of the defined types were placed in this encounter.  No follow-ups on file.    Please do your part to reduce the spread of COVID-19.    

## 2020-10-19 ENCOUNTER — Telehealth: Payer: Self-pay | Admitting: Pulmonary Disease

## 2020-10-19 NOTE — Telephone Encounter (Signed)
No samples of trelegy are available now  Spoke with Janett Billow and made her aware  Nothing further needed

## 2020-11-03 NOTE — Telephone Encounter (Signed)
Nothing noted in message. Will close encounter.  

## 2020-11-06 ENCOUNTER — Telehealth: Payer: Self-pay | Admitting: Pulmonary Disease

## 2020-11-06 MED ORDER — BREZTRI AEROSPHERE 160-9-4.8 MCG/ACT IN AERO: 2.0000 | INHALATION_SPRAY | Freq: Two times a day (BID) | RESPIRATORY_TRACT | 0 refills | Status: DC

## 2020-11-06 NOTE — Telephone Encounter (Signed)
Spoke with patient's daughter [name]. She was asking for samples of Breztri. I advised her that I would be able to put 2 samples up front for her. She verbalized understanding. Samples have been placed up front for pickup.

## 2020-11-28 DIAGNOSIS — J9690 Respiratory failure, unspecified, unspecified whether with hypoxia or hypercapnia: Secondary | ICD-10-CM | POA: Diagnosis not present

## 2020-11-28 DIAGNOSIS — J441 Chronic obstructive pulmonary disease with (acute) exacerbation: Secondary | ICD-10-CM | POA: Diagnosis not present

## 2020-12-29 DIAGNOSIS — J9690 Respiratory failure, unspecified, unspecified whether with hypoxia or hypercapnia: Secondary | ICD-10-CM | POA: Diagnosis not present

## 2020-12-29 DIAGNOSIS — J441 Chronic obstructive pulmonary disease with (acute) exacerbation: Secondary | ICD-10-CM | POA: Diagnosis not present

## 2021-01-26 DIAGNOSIS — J441 Chronic obstructive pulmonary disease with (acute) exacerbation: Secondary | ICD-10-CM | POA: Diagnosis not present

## 2021-01-26 DIAGNOSIS — J9690 Respiratory failure, unspecified, unspecified whether with hypoxia or hypercapnia: Secondary | ICD-10-CM | POA: Diagnosis not present

## 2021-02-10 ENCOUNTER — Telehealth: Payer: Self-pay | Admitting: Pulmonary Disease

## 2021-02-10 MED ORDER — BREZTRI AEROSPHERE 160-9-4.8 MCG/ACT IN AERO: 2.0000 | INHALATION_SPRAY | Freq: Two times a day (BID) | RESPIRATORY_TRACT | 5 refills | Status: DC

## 2021-02-10 MED ORDER — BREZTRI AEROSPHERE 160-9-4.8 MCG/ACT IN AERO: 2.0000 | INHALATION_SPRAY | Freq: Two times a day (BID) | RESPIRATORY_TRACT | 0 refills | Status: DC

## 2021-02-10 NOTE — Telephone Encounter (Signed)
Called and spoke with daughter Chloe Wilcox (per DPR) about breztri samples. Asked if they have had prescription sent in to pharmacy and she stated yes and it was over $100. Asked if they had filled out patient assistance paperwork and she stated no. Advised her that we would leave two samples of Breztri up front with patient assistance paperwork. Expressed understanding to fill out paperwork and return. She stated someone would stop in tomorrow (02/11/2021) to pick up. Will route to Leigh to follow up.

## 2021-02-17 IMAGING — CT CT ANGIO CHEST
2 of 6 series · 18 of 36 positions shown · IV contrast (omnipaque)
Comparison: Chest x-ray from same day.

CLINICAL DATA: Shortness of breath. COVID positive. Monoclonal
antibody infusion on [REDACTED].

EXAM:
CT ANGIOGRAPHY CHEST WITH CONTRAST
TECHNIQUE: Multidetector CT imaging of the chest was performed using the
standard protocol during bolus administration of intravenous
contrast. Multiplanar CT image reconstructions and MIPs were
obtained to evaluate the vascular anatomy.
CONTRAST:  69mL OMNIPAQUE IOHEXOL 350 MG/ML SOLN

[Series 5: thins · axial · 0.60mm/px · z∈[-243,+2]mm · 17 of 277 slices shown]
[im 16/277  lung]
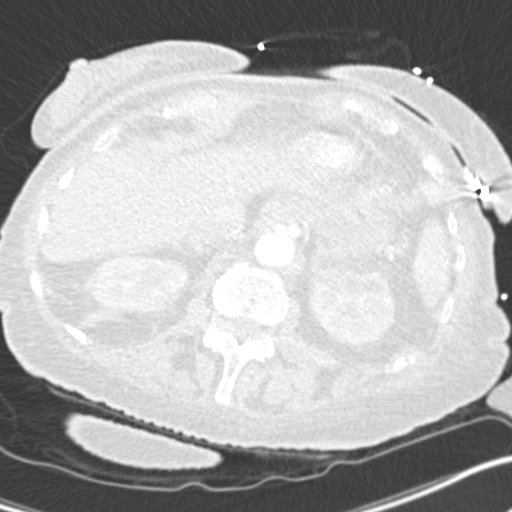
[im 31/277  mediastinal]
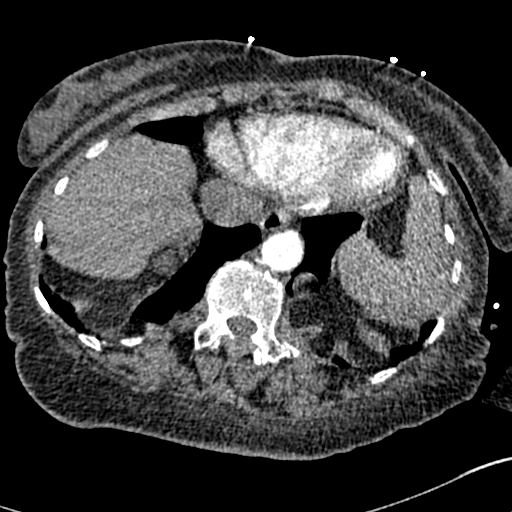
[im 47/277  lung]
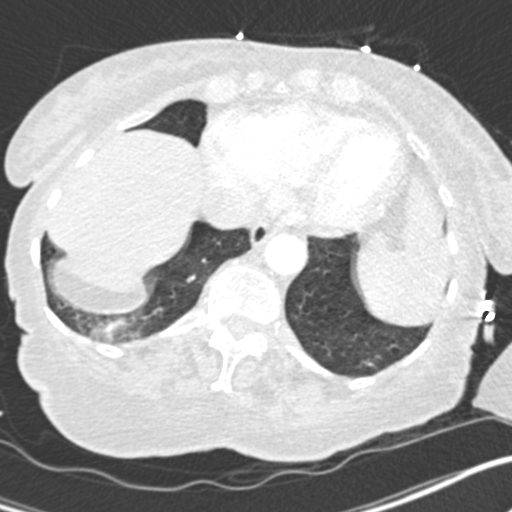
[im 62/277  mediastinal]
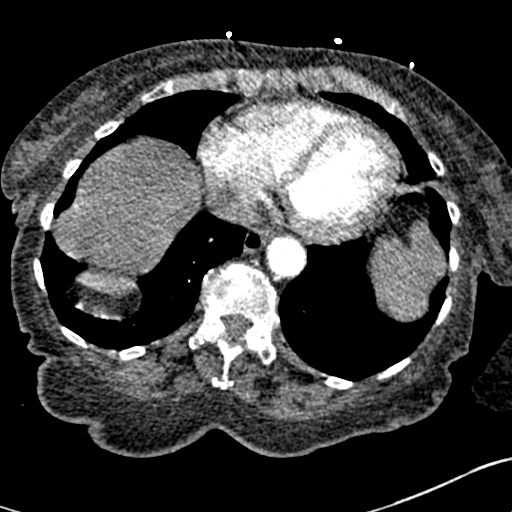
[im 77/277  lung]
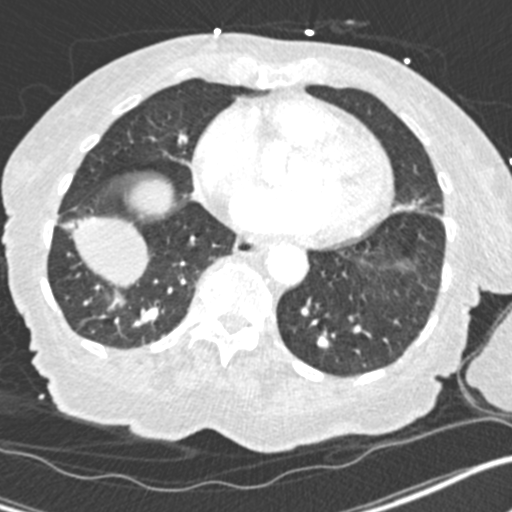
[im 93/277  mediastinal]
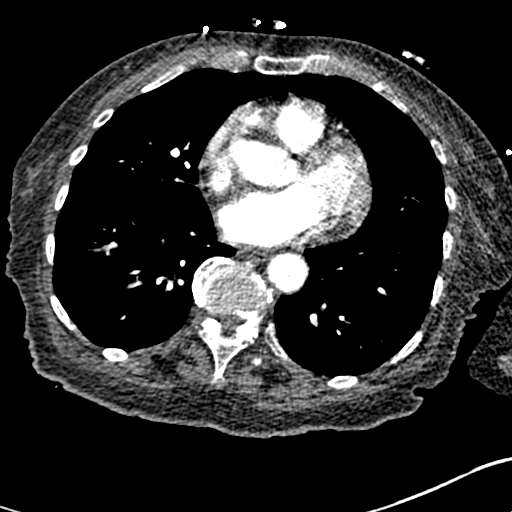
[im 108/277  lung]
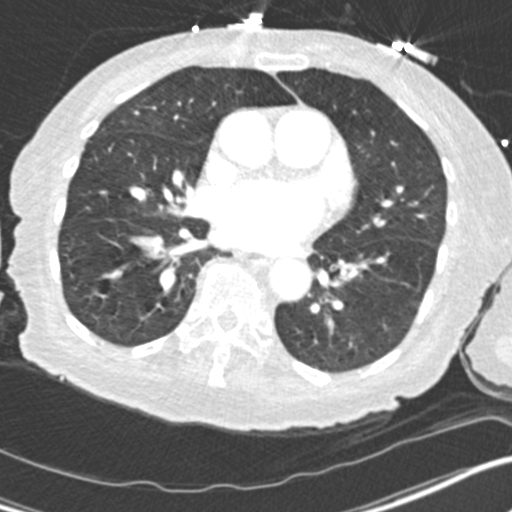
[im 123/277  mediastinal]
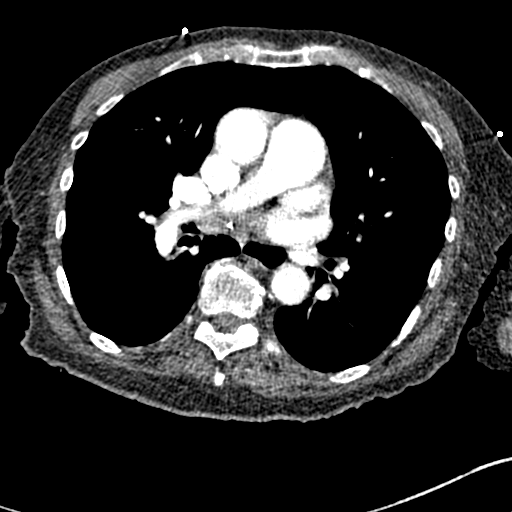
[im 139/277  lung]
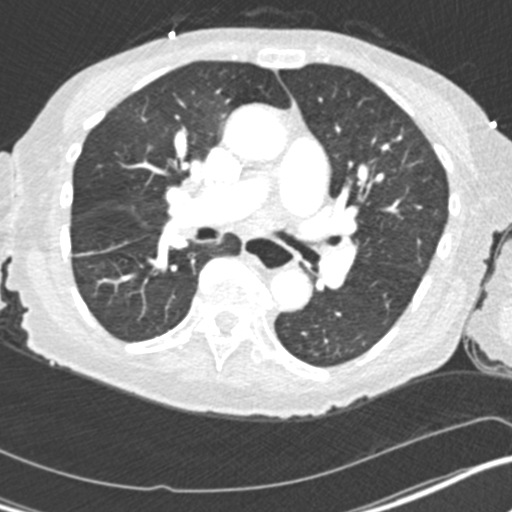
[im 154/277  mediastinal]
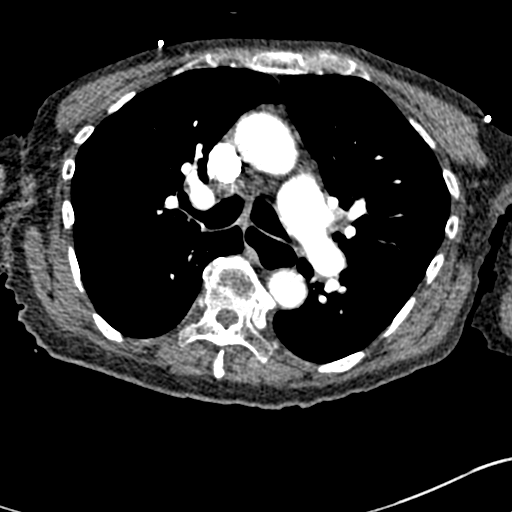
[im 169/277  lung]
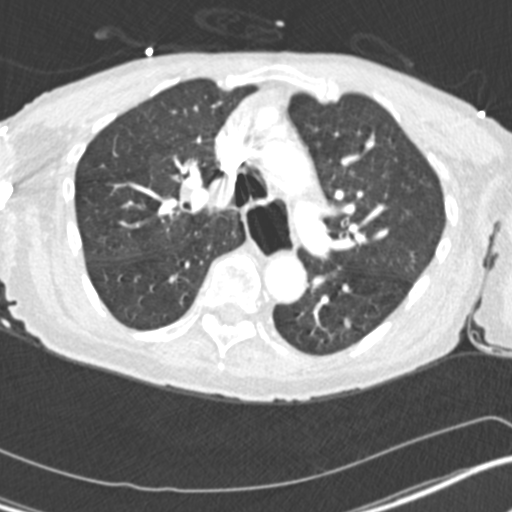
[im 185/277  mediastinal]
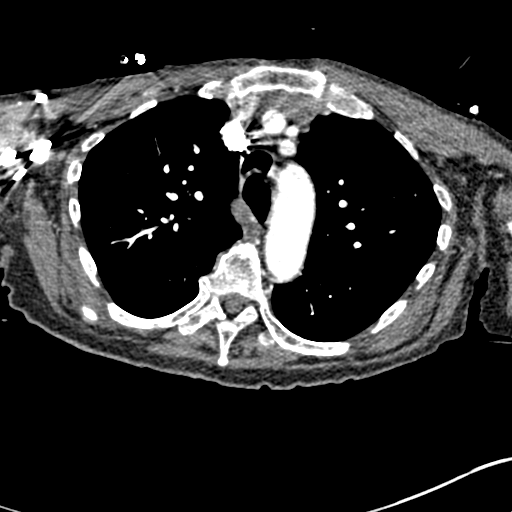
[im 200/277  lung]
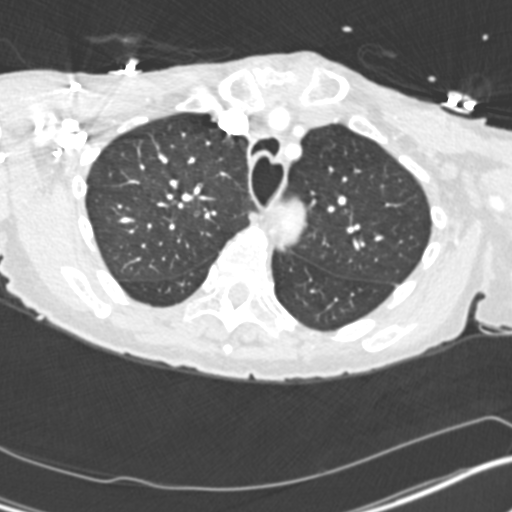
[im 215/277  mediastinal]
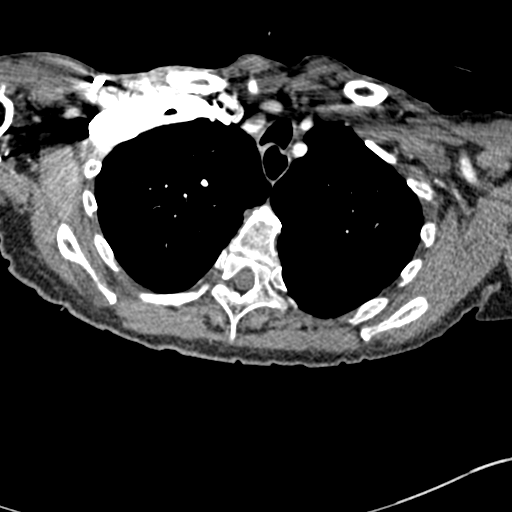
[im 231/277  lung]
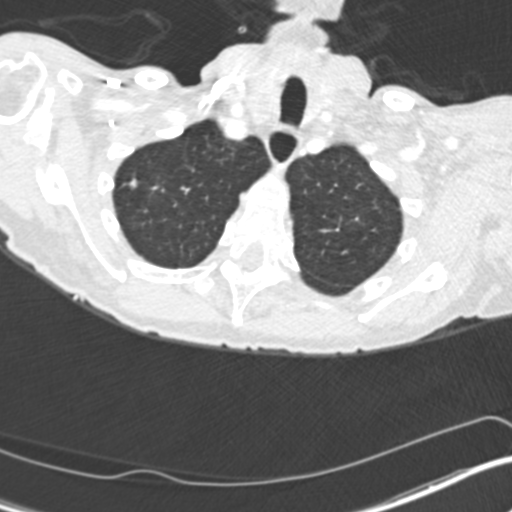
[im 246/277  mediastinal]
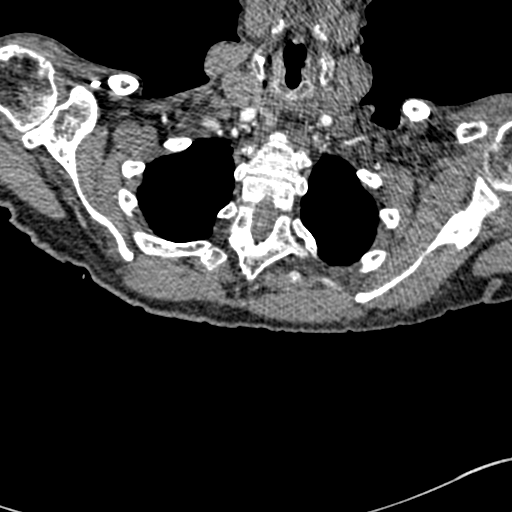
[im 261/277  lung]
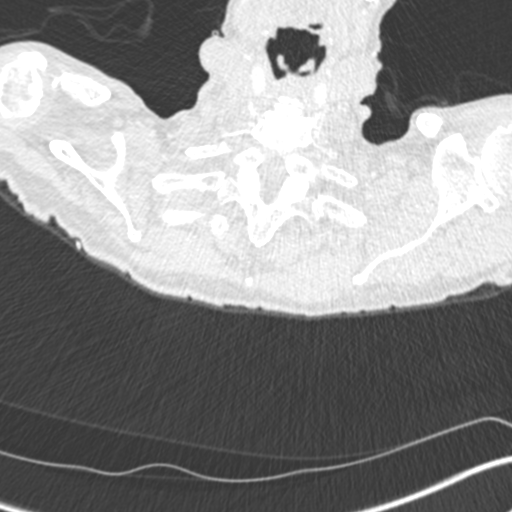

[Series 7: coronal mpr · coronal · 0.58mm/px · 1 of 129 slices shown]
[im 65/129  mediastinal]
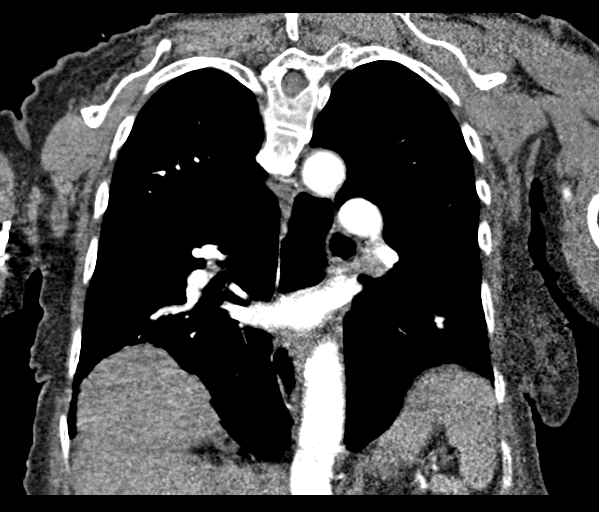

[18 of 36 positions shown; findings below may reference images not displayed]

FINDINGS: Cardiovascular: Satisfactory opacification of the pulmonary arteries
to the segmental level. No evidence of pulmonary embolism. Normal
heart size. No pericardial effusion. No thoracic aortic aneurysm or
dissection. Atherosclerotic calcification of the aortic arch.

Mediastinum/Nodes: No enlarged mediastinal, hilar, or axillary lymph
nodes. Bilateral hypodense thyroid nodules measuring up to 1.3 cm.
Not clinically significant; no follow-up imaging recommended.
Patulous esophagus.

Lungs/Pleura: Emphysema. 4 mm irregular nodular density in the right
upper lobe (series 6, image 24). Subsegmental atelectasis in the
right lower lobe. No focal consolidation, pleural effusion, or
pneumothorax.

Upper Abdomen: No acute abnormality. Small bilateral renal simple
cysts.

Musculoskeletal: No chest wall abnormality. No acute or significant
osseous findings. Exaggerated upper thoracic kyphosis. Chronic left
L1 and L2 transverse process fractures. Right Bochdalek hernia.

Review of the MIP images confirms the above findings.
IMPRESSION: 1. No evidence of pulmonary embolism. No acute intrathoracic
process.
2. 4 mm irregular nodule in the right upper lobe. Non-contrast chest
CT can be considered in 12 months.
3. Aortic Atherosclerosis (PY0F9-981.1) and Emphysema (PY0F9-W74.A).

## 2021-02-26 DIAGNOSIS — J441 Chronic obstructive pulmonary disease with (acute) exacerbation: Secondary | ICD-10-CM | POA: Diagnosis not present

## 2021-02-26 DIAGNOSIS — J9690 Respiratory failure, unspecified, unspecified whether with hypoxia or hypercapnia: Secondary | ICD-10-CM | POA: Diagnosis not present

## 2021-03-22 DIAGNOSIS — J449 Chronic obstructive pulmonary disease, unspecified: Secondary | ICD-10-CM | POA: Diagnosis not present

## 2021-03-22 DIAGNOSIS — E7800 Pure hypercholesterolemia, unspecified: Secondary | ICD-10-CM | POA: Diagnosis not present

## 2021-03-22 DIAGNOSIS — M1990 Unspecified osteoarthritis, unspecified site: Secondary | ICD-10-CM | POA: Diagnosis not present

## 2021-03-22 DIAGNOSIS — I10 Essential (primary) hypertension: Secondary | ICD-10-CM | POA: Diagnosis not present

## 2021-03-22 DIAGNOSIS — J410 Simple chronic bronchitis: Secondary | ICD-10-CM | POA: Diagnosis not present

## 2021-03-22 DIAGNOSIS — D508 Other iron deficiency anemias: Secondary | ICD-10-CM | POA: Diagnosis not present

## 2021-03-22 DIAGNOSIS — M159 Polyosteoarthritis, unspecified: Secondary | ICD-10-CM | POA: Diagnosis not present

## 2021-03-28 DIAGNOSIS — J9690 Respiratory failure, unspecified, unspecified whether with hypoxia or hypercapnia: Secondary | ICD-10-CM | POA: Diagnosis not present

## 2021-03-28 DIAGNOSIS — J441 Chronic obstructive pulmonary disease with (acute) exacerbation: Secondary | ICD-10-CM | POA: Diagnosis not present

## 2021-04-01 DIAGNOSIS — G894 Chronic pain syndrome: Secondary | ICD-10-CM | POA: Diagnosis not present

## 2021-04-01 DIAGNOSIS — M159 Polyosteoarthritis, unspecified: Secondary | ICD-10-CM | POA: Diagnosis not present

## 2021-04-01 DIAGNOSIS — E7800 Pure hypercholesterolemia, unspecified: Secondary | ICD-10-CM | POA: Diagnosis not present

## 2021-04-01 DIAGNOSIS — I10 Essential (primary) hypertension: Secondary | ICD-10-CM | POA: Diagnosis not present

## 2021-04-19 ENCOUNTER — Telehealth: Payer: Self-pay | Admitting: Pulmonary Disease

## 2021-04-19 MED ORDER — BREZTRI AEROSPHERE 160-9-4.8 MCG/ACT IN AERO: 2.0000 | INHALATION_SPRAY | Freq: Two times a day (BID) | RESPIRATORY_TRACT | 0 refills | Status: DC

## 2021-04-19 NOTE — Telephone Encounter (Signed)
Dr. Valeta Harms may we give samples to pt? It can take up to 4 weeks or longer to hear back from pt asst.

## 2021-04-19 NOTE — Telephone Encounter (Signed)
Yes, ok for samples.  Thanks  Garner Nash, DO [facility] 04/19/2021 1:25 PM

## 2021-04-19 NOTE — Telephone Encounter (Signed)
Spoke with Genveva (per DPR) and informed samples would be placed at check-in desk for her to pick up for pt.

## 2021-04-28 DIAGNOSIS — J9690 Respiratory failure, unspecified, unspecified whether with hypoxia or hypercapnia: Secondary | ICD-10-CM | POA: Diagnosis not present

## 2021-04-28 DIAGNOSIS — J441 Chronic obstructive pulmonary disease with (acute) exacerbation: Secondary | ICD-10-CM | POA: Diagnosis not present

## 2021-05-25 DIAGNOSIS — D538 Other specified nutritional anemias: Secondary | ICD-10-CM | POA: Diagnosis not present

## 2021-05-25 DIAGNOSIS — J410 Simple chronic bronchitis: Secondary | ICD-10-CM | POA: Diagnosis not present

## 2021-05-25 DIAGNOSIS — J449 Chronic obstructive pulmonary disease, unspecified: Secondary | ICD-10-CM | POA: Diagnosis not present

## 2021-05-25 DIAGNOSIS — M1990 Unspecified osteoarthritis, unspecified site: Secondary | ICD-10-CM | POA: Diagnosis not present

## 2021-05-25 DIAGNOSIS — M170 Bilateral primary osteoarthritis of knee: Secondary | ICD-10-CM | POA: Diagnosis not present

## 2021-05-25 DIAGNOSIS — D508 Other iron deficiency anemias: Secondary | ICD-10-CM | POA: Diagnosis not present

## 2021-05-25 DIAGNOSIS — E7800 Pure hypercholesterolemia, unspecified: Secondary | ICD-10-CM | POA: Diagnosis not present

## 2021-05-25 DIAGNOSIS — M159 Polyosteoarthritis, unspecified: Secondary | ICD-10-CM | POA: Diagnosis not present

## 2021-05-25 DIAGNOSIS — I10 Essential (primary) hypertension: Secondary | ICD-10-CM | POA: Diagnosis not present

## 2021-05-25 DIAGNOSIS — D509 Iron deficiency anemia, unspecified: Secondary | ICD-10-CM | POA: Diagnosis not present

## 2021-05-27 ENCOUNTER — Ambulatory Visit: Payer: Medicare Other

## 2021-05-28 DIAGNOSIS — J9690 Respiratory failure, unspecified, unspecified whether with hypoxia or hypercapnia: Secondary | ICD-10-CM | POA: Diagnosis not present

## 2021-05-28 DIAGNOSIS — J441 Chronic obstructive pulmonary disease with (acute) exacerbation: Secondary | ICD-10-CM | POA: Diagnosis not present

## 2021-05-28 DIAGNOSIS — H524 Presbyopia: Secondary | ICD-10-CM | POA: Diagnosis not present

## 2021-06-10 ENCOUNTER — Ambulatory Visit: Payer: Medicare Other | Admitting: Pulmonary Disease

## 2021-06-22 DIAGNOSIS — Z9981 Dependence on supplemental oxygen: Secondary | ICD-10-CM | POA: Diagnosis not present

## 2021-06-22 DIAGNOSIS — G8929 Other chronic pain: Secondary | ICD-10-CM | POA: Diagnosis not present

## 2021-06-22 DIAGNOSIS — M1990 Unspecified osteoarthritis, unspecified site: Secondary | ICD-10-CM | POA: Diagnosis not present

## 2021-06-22 DIAGNOSIS — E7800 Pure hypercholesterolemia, unspecified: Secondary | ICD-10-CM | POA: Diagnosis not present

## 2021-06-22 DIAGNOSIS — M25512 Pain in left shoulder: Secondary | ICD-10-CM | POA: Diagnosis not present

## 2021-06-22 DIAGNOSIS — I10 Essential (primary) hypertension: Secondary | ICD-10-CM | POA: Diagnosis not present

## 2021-06-22 DIAGNOSIS — J449 Chronic obstructive pulmonary disease, unspecified: Secondary | ICD-10-CM | POA: Diagnosis not present

## 2021-06-22 DIAGNOSIS — D509 Iron deficiency anemia, unspecified: Secondary | ICD-10-CM | POA: Diagnosis not present

## 2021-06-22 DIAGNOSIS — M25511 Pain in right shoulder: Secondary | ICD-10-CM | POA: Diagnosis not present

## 2021-06-22 DIAGNOSIS — M6281 Muscle weakness (generalized): Secondary | ICD-10-CM | POA: Diagnosis not present

## 2021-06-28 ENCOUNTER — Telehealth: Payer: Self-pay | Admitting: Pulmonary Disease

## 2021-06-28 DIAGNOSIS — G8929 Other chronic pain: Secondary | ICD-10-CM | POA: Diagnosis not present

## 2021-06-28 DIAGNOSIS — J9690 Respiratory failure, unspecified, unspecified whether with hypoxia or hypercapnia: Secondary | ICD-10-CM | POA: Diagnosis not present

## 2021-06-28 DIAGNOSIS — E7800 Pure hypercholesterolemia, unspecified: Secondary | ICD-10-CM | POA: Diagnosis not present

## 2021-06-28 DIAGNOSIS — M1990 Unspecified osteoarthritis, unspecified site: Secondary | ICD-10-CM | POA: Diagnosis not present

## 2021-06-28 DIAGNOSIS — M25511 Pain in right shoulder: Secondary | ICD-10-CM | POA: Diagnosis not present

## 2021-06-28 DIAGNOSIS — I10 Essential (primary) hypertension: Secondary | ICD-10-CM | POA: Diagnosis not present

## 2021-06-28 DIAGNOSIS — D509 Iron deficiency anemia, unspecified: Secondary | ICD-10-CM | POA: Diagnosis not present

## 2021-06-28 DIAGNOSIS — J441 Chronic obstructive pulmonary disease with (acute) exacerbation: Secondary | ICD-10-CM | POA: Diagnosis not present

## 2021-06-28 DIAGNOSIS — M25512 Pain in left shoulder: Secondary | ICD-10-CM | POA: Diagnosis not present

## 2021-06-28 DIAGNOSIS — J449 Chronic obstructive pulmonary disease, unspecified: Secondary | ICD-10-CM | POA: Diagnosis not present

## 2021-06-28 DIAGNOSIS — M6281 Muscle weakness (generalized): Secondary | ICD-10-CM | POA: Diagnosis not present

## 2021-06-28 MED ORDER — ALBUTEROL SULFATE HFA 108 (90 BASE) MCG/ACT IN AERS: 2.0000 | INHALATION_SPRAY | Freq: Four times a day (QID) | RESPIRATORY_TRACT | 5 refills | Status: DC | PRN

## 2021-06-28 MED ORDER — PREDNISONE 20 MG PO TABS: 40.0000 mg | ORAL_TABLET | Freq: Every day | ORAL | 0 refills | Status: AC

## 2021-06-28 NOTE — Telephone Encounter (Signed)
Called and spoke with patient's daughter [place]. She stated that the patient was around a lot of cool air this weekend (she sat directly in front of a window A/C unit). Since then, she has been having a lot of nasal congestion. So far the nasal discharge has been clear. She does not have a cough. She also denied any fevers, body aches, increased SOB or wheezing. She is still using 2L of O2 24/7. She is also still using her Breztri and albuterol HFA/nebs.   She also stated that she needed a refill on her albuterol HFA. She is aware that I will go ahead and send in a refill for her.   [name] wanted to know if Dr. Valeta Harms could send in medication to avoid her symptoms getting worse. She stated that her runny nose can easily change to a productive cough and increased SOB.   Pharmacy in [facility] on [address].   Dr. Valeta Harms, can you please advise? Thanks!

## 2021-06-28 NOTE — Telephone Encounter (Signed)
Called and spoke with [place]. She verbalized understanding. Will go ahead and send in the prednisone. She will let us know if her symptoms do not improve.   RX has been sent.   Nothing further needed at time of call.

## 2021-07-02 DIAGNOSIS — I10 Essential (primary) hypertension: Secondary | ICD-10-CM | POA: Diagnosis not present

## 2021-07-02 DIAGNOSIS — D509 Iron deficiency anemia, unspecified: Secondary | ICD-10-CM | POA: Diagnosis not present

## 2021-07-02 DIAGNOSIS — J449 Chronic obstructive pulmonary disease, unspecified: Secondary | ICD-10-CM | POA: Diagnosis not present

## 2021-07-02 DIAGNOSIS — M25511 Pain in right shoulder: Secondary | ICD-10-CM | POA: Diagnosis not present

## 2021-07-02 DIAGNOSIS — M6281 Muscle weakness (generalized): Secondary | ICD-10-CM | POA: Diagnosis not present

## 2021-07-02 DIAGNOSIS — E7800 Pure hypercholesterolemia, unspecified: Secondary | ICD-10-CM | POA: Diagnosis not present

## 2021-07-02 DIAGNOSIS — M1990 Unspecified osteoarthritis, unspecified site: Secondary | ICD-10-CM | POA: Diagnosis not present

## 2021-07-02 DIAGNOSIS — M25512 Pain in left shoulder: Secondary | ICD-10-CM | POA: Diagnosis not present

## 2021-07-02 DIAGNOSIS — G8929 Other chronic pain: Secondary | ICD-10-CM | POA: Diagnosis not present

## 2021-07-07 DIAGNOSIS — I10 Essential (primary) hypertension: Secondary | ICD-10-CM | POA: Diagnosis not present

## 2021-07-07 DIAGNOSIS — G8929 Other chronic pain: Secondary | ICD-10-CM | POA: Diagnosis not present

## 2021-07-07 DIAGNOSIS — D509 Iron deficiency anemia, unspecified: Secondary | ICD-10-CM | POA: Diagnosis not present

## 2021-07-07 DIAGNOSIS — M6281 Muscle weakness (generalized): Secondary | ICD-10-CM | POA: Diagnosis not present

## 2021-07-07 DIAGNOSIS — J449 Chronic obstructive pulmonary disease, unspecified: Secondary | ICD-10-CM | POA: Diagnosis not present

## 2021-07-07 DIAGNOSIS — M1990 Unspecified osteoarthritis, unspecified site: Secondary | ICD-10-CM | POA: Diagnosis not present

## 2021-07-07 DIAGNOSIS — M25512 Pain in left shoulder: Secondary | ICD-10-CM | POA: Diagnosis not present

## 2021-07-07 DIAGNOSIS — M25511 Pain in right shoulder: Secondary | ICD-10-CM | POA: Diagnosis not present

## 2021-07-07 DIAGNOSIS — E7800 Pure hypercholesterolemia, unspecified: Secondary | ICD-10-CM | POA: Diagnosis not present

## 2021-07-09 DIAGNOSIS — D509 Iron deficiency anemia, unspecified: Secondary | ICD-10-CM | POA: Diagnosis not present

## 2021-07-09 DIAGNOSIS — M1990 Unspecified osteoarthritis, unspecified site: Secondary | ICD-10-CM | POA: Diagnosis not present

## 2021-07-09 DIAGNOSIS — I10 Essential (primary) hypertension: Secondary | ICD-10-CM | POA: Diagnosis not present

## 2021-07-09 DIAGNOSIS — M25512 Pain in left shoulder: Secondary | ICD-10-CM | POA: Diagnosis not present

## 2021-07-09 DIAGNOSIS — M6281 Muscle weakness (generalized): Secondary | ICD-10-CM | POA: Diagnosis not present

## 2021-07-09 DIAGNOSIS — M25511 Pain in right shoulder: Secondary | ICD-10-CM | POA: Diagnosis not present

## 2021-07-09 DIAGNOSIS — J449 Chronic obstructive pulmonary disease, unspecified: Secondary | ICD-10-CM | POA: Diagnosis not present

## 2021-07-09 DIAGNOSIS — E7800 Pure hypercholesterolemia, unspecified: Secondary | ICD-10-CM | POA: Diagnosis not present

## 2021-07-09 DIAGNOSIS — G8929 Other chronic pain: Secondary | ICD-10-CM | POA: Diagnosis not present

## 2021-07-14 ENCOUNTER — Telehealth: Payer: Self-pay | Admitting: Pulmonary Disease

## 2021-07-14 DIAGNOSIS — E7800 Pure hypercholesterolemia, unspecified: Secondary | ICD-10-CM | POA: Diagnosis not present

## 2021-07-14 DIAGNOSIS — G8929 Other chronic pain: Secondary | ICD-10-CM | POA: Diagnosis not present

## 2021-07-14 DIAGNOSIS — M25511 Pain in right shoulder: Secondary | ICD-10-CM | POA: Diagnosis not present

## 2021-07-14 DIAGNOSIS — M25512 Pain in left shoulder: Secondary | ICD-10-CM | POA: Diagnosis not present

## 2021-07-14 DIAGNOSIS — M1990 Unspecified osteoarthritis, unspecified site: Secondary | ICD-10-CM | POA: Diagnosis not present

## 2021-07-14 DIAGNOSIS — J449 Chronic obstructive pulmonary disease, unspecified: Secondary | ICD-10-CM | POA: Diagnosis not present

## 2021-07-14 DIAGNOSIS — D509 Iron deficiency anemia, unspecified: Secondary | ICD-10-CM | POA: Diagnosis not present

## 2021-07-14 DIAGNOSIS — I10 Essential (primary) hypertension: Secondary | ICD-10-CM | POA: Diagnosis not present

## 2021-07-14 DIAGNOSIS — M6281 Muscle weakness (generalized): Secondary | ICD-10-CM | POA: Diagnosis not present

## 2021-07-14 MED ORDER — IPRATROPIUM-ALBUTEROL 0.5-2.5 (3) MG/3ML IN SOLN: 3.0000 mL | Freq: Two times a day (BID) | RESPIRATORY_TRACT | 6 refills | Status: DC

## 2021-07-14 MED ORDER — BREZTRI AEROSPHERE 160-9-4.8 MCG/ACT IN AERO: 2.0000 | INHALATION_SPRAY | Freq: Two times a day (BID) | RESPIRATORY_TRACT | 3 refills | Status: DC

## 2021-07-14 NOTE — Telephone Encounter (Signed)
I called Chloe Wilcox and she is aware of the duoneb that has been sent to the pharmacy.  She requested samples of the breztri bur we did not have any in stock.    She stated that the medication is $400 a month and that they cannot afford that.  She stated that the pt would have to go without this.  She stated that they did do the pt assistance but they never heard back about it.    I called [place] [place] and they stated that this paperwork got scanned in but was never processed.  They are processing it now to see if she will qualify.  She is approved from 07/14/21 through 11/27/2021.  Her daughter is aware.  I have printed the rx out and it will need to be signed and faxed with the other 1 paper that was not completely filled out to [employer] [place].   I have placed this in BI look at to sign the next time he is in the office.

## 2021-07-15 NOTE — Telephone Encounter (Signed)
Will forward to CP since she is scheduled to work BI on Friday.

## 2021-07-16 NOTE — Telephone Encounter (Signed)
Forms given to BI to sign.

## 2021-07-19 DIAGNOSIS — M25512 Pain in left shoulder: Secondary | ICD-10-CM | POA: Diagnosis not present

## 2021-07-19 DIAGNOSIS — I10 Essential (primary) hypertension: Secondary | ICD-10-CM | POA: Diagnosis not present

## 2021-07-19 DIAGNOSIS — D509 Iron deficiency anemia, unspecified: Secondary | ICD-10-CM | POA: Diagnosis not present

## 2021-07-19 DIAGNOSIS — M6281 Muscle weakness (generalized): Secondary | ICD-10-CM | POA: Diagnosis not present

## 2021-07-19 DIAGNOSIS — M1990 Unspecified osteoarthritis, unspecified site: Secondary | ICD-10-CM | POA: Diagnosis not present

## 2021-07-19 DIAGNOSIS — M25511 Pain in right shoulder: Secondary | ICD-10-CM | POA: Diagnosis not present

## 2021-07-19 DIAGNOSIS — G8929 Other chronic pain: Secondary | ICD-10-CM | POA: Diagnosis not present

## 2021-07-19 DIAGNOSIS — J449 Chronic obstructive pulmonary disease, unspecified: Secondary | ICD-10-CM | POA: Diagnosis not present

## 2021-07-19 DIAGNOSIS — E7800 Pure hypercholesterolemia, unspecified: Secondary | ICD-10-CM | POA: Diagnosis not present

## 2021-07-21 ENCOUNTER — Telehealth: Payer: Self-pay | Admitting: Pulmonary Disease

## 2021-07-21 DIAGNOSIS — J449 Chronic obstructive pulmonary disease, unspecified: Secondary | ICD-10-CM | POA: Diagnosis not present

## 2021-07-21 DIAGNOSIS — D509 Iron deficiency anemia, unspecified: Secondary | ICD-10-CM | POA: Diagnosis not present

## 2021-07-21 DIAGNOSIS — M25511 Pain in right shoulder: Secondary | ICD-10-CM | POA: Diagnosis not present

## 2021-07-21 DIAGNOSIS — G8929 Other chronic pain: Secondary | ICD-10-CM | POA: Diagnosis not present

## 2021-07-21 DIAGNOSIS — M1990 Unspecified osteoarthritis, unspecified site: Secondary | ICD-10-CM | POA: Diagnosis not present

## 2021-07-21 DIAGNOSIS — M6281 Muscle weakness (generalized): Secondary | ICD-10-CM | POA: Diagnosis not present

## 2021-07-21 DIAGNOSIS — M25512 Pain in left shoulder: Secondary | ICD-10-CM | POA: Diagnosis not present

## 2021-07-21 DIAGNOSIS — I10 Essential (primary) hypertension: Secondary | ICD-10-CM | POA: Diagnosis not present

## 2021-07-21 DIAGNOSIS — E7800 Pure hypercholesterolemia, unspecified: Secondary | ICD-10-CM | POA: Diagnosis not present

## 2021-07-22 NOTE — Telephone Encounter (Signed)
Call made to [employer], recording states they are going through some administrative changes and phone lines are down and will be available 8/30 however fax lines are open.   Fax sent to inquire about status of application.   Sample placed upfront.   Call made to patient, confirmed patient DOB. Made aware samples has been placed upfront for pickup. Made aware we are still awaiting a decision on patient assistance for inhaler. Voiced understanding.   Nothing further needed at this time.

## 2021-07-26 ENCOUNTER — Telehealth: Payer: Self-pay | Admitting: Pulmonary Disease

## 2021-07-26 NOTE — Telephone Encounter (Signed)
Spoke to patient's daughter, Chloe Wilcox). Patient is experiencing mild sob, nasal drainage clear in color and dry cough x2d.  Denied f/c/s or additional sx.  Patient is using 2 vials of albuterol solution and Breztri BID with relief in sx.  [name] feels that sx are controlled. she just wanted to make Dr. Valeta Harms aware. Patient wears 3L cont. [name] has not been monitoring spo2.   Dr. Valeta Harms, please advise. Thanks

## 2021-07-26 NOTE — Telephone Encounter (Signed)
Lm for patient's daughter, [name]

## 2021-07-26 NOTE — Telephone Encounter (Signed)
[name] daughter is returning phone call. [place] phone number is [phone].

## 2021-07-26 NOTE — Telephone Encounter (Signed)
Patient's daughter, Edwena Felty) is aware of below message and voiced her understanding.  [name] will call back with covid test is positive.  Nothing further needed at this time.

## 2021-07-27 ENCOUNTER — Other Ambulatory Visit: Payer: Self-pay | Admitting: *Deleted

## 2021-07-27 MED ORDER — BREZTRI AEROSPHERE 160-9-4.8 MCG/ACT IN AERO: 2.0000 | INHALATION_SPRAY | Freq: Two times a day (BID) | RESPIRATORY_TRACT | 3 refills | Status: DC

## 2021-07-28 ENCOUNTER — Telehealth: Payer: Self-pay | Admitting: Pulmonary Disease

## 2021-07-28 DIAGNOSIS — M1990 Unspecified osteoarthritis, unspecified site: Secondary | ICD-10-CM | POA: Diagnosis not present

## 2021-07-28 DIAGNOSIS — E7800 Pure hypercholesterolemia, unspecified: Secondary | ICD-10-CM | POA: Diagnosis not present

## 2021-07-28 DIAGNOSIS — M25512 Pain in left shoulder: Secondary | ICD-10-CM | POA: Diagnosis not present

## 2021-07-28 DIAGNOSIS — M6281 Muscle weakness (generalized): Secondary | ICD-10-CM | POA: Diagnosis not present

## 2021-07-28 DIAGNOSIS — I10 Essential (primary) hypertension: Secondary | ICD-10-CM | POA: Diagnosis not present

## 2021-07-28 DIAGNOSIS — G8929 Other chronic pain: Secondary | ICD-10-CM | POA: Diagnosis not present

## 2021-07-28 DIAGNOSIS — J449 Chronic obstructive pulmonary disease, unspecified: Secondary | ICD-10-CM | POA: Diagnosis not present

## 2021-07-28 DIAGNOSIS — D509 Iron deficiency anemia, unspecified: Secondary | ICD-10-CM | POA: Diagnosis not present

## 2021-07-28 DIAGNOSIS — M25511 Pain in right shoulder: Secondary | ICD-10-CM | POA: Diagnosis not present

## 2021-07-28 MED ORDER — PREDNISONE 10 MG PO TABS: ORAL_TABLET | ORAL | 0 refills | Status: DC

## 2021-07-28 NOTE — Telephone Encounter (Signed)
Primary Pulmonologist: Dr. Valeta Harms Last office visit and with whom: 10/15/20, Dr. Valeta Harms What do we see them for (pulmonary problems): Chronic Respiratory Failure with hypoxia Last OV assessment/plan:  Instructions   Return in about 6 months (around 04/14/2021) for with APP or Dr. Valeta Harms. Thank you for visiting Dr. Valeta Harms at [facility]. Today we recommend the following: No orders of the defined types were placed in this encounter.   No orders of the defined types were placed in this encounter.   No follow-ups on file.      Reason for call:  Called and spoke with Patient's daughter Chloe Wilcox ([place]).  Chloe Wilcox stated patient has started having a nonproductive cough the last few days that has gotten worse. Chloe Wilcox stated Patient has no other symptoms or problems. Chloe Wilcox stated Patient has not taken anything OTC for cough. [name] requested any prescriptions to be sent to [facility].    No Known Allergies  Immunization History  Administered Date(s) Administered   PFIZER(Purple Top)SARS-COV-2 Vaccination 01/04/2020, 01/27/2020

## 2021-07-28 NOTE — Telephone Encounter (Signed)
Called and spoke with [place]. She stated that she took an at home test yesterday and it was negative. She wishes to proceed with the prednisone for the next 5 days. She wants to see if the prednisone will help. If not, she will call back for an appt.   RX has been sent.   Nothing further needed at time of call.

## 2021-07-29 ENCOUNTER — Telehealth: Payer: Self-pay | Admitting: Pulmonary Disease

## 2021-07-29 ENCOUNTER — Other Ambulatory Visit: Payer: Self-pay

## 2021-07-29 ENCOUNTER — Emergency Department (HOSPITAL_COMMUNITY): Payer: Medicare Other

## 2021-07-29 ENCOUNTER — Encounter (HOSPITAL_COMMUNITY): Payer: Self-pay | Admitting: Emergency Medicine

## 2021-07-29 ENCOUNTER — Emergency Department (HOSPITAL_COMMUNITY)
Admission: EM | Admit: 2021-07-29 | Discharge: 2021-07-29 | Disposition: A | Discharge status: Home / Self Care | Payer: Medicare Other | Attending: Emergency Medicine | Admitting: Emergency Medicine

## 2021-07-29 DIAGNOSIS — I10 Essential (primary) hypertension: Secondary | ICD-10-CM | POA: Insufficient documentation

## 2021-07-29 DIAGNOSIS — Z743 Need for continuous supervision: Secondary | ICD-10-CM | POA: Diagnosis not present

## 2021-07-29 DIAGNOSIS — J9690 Respiratory failure, unspecified, unspecified whether with hypoxia or hypercapnia: Secondary | ICD-10-CM | POA: Diagnosis not present

## 2021-07-29 DIAGNOSIS — Z20822 Contact with and (suspected) exposure to covid-19: Secondary | ICD-10-CM | POA: Insufficient documentation

## 2021-07-29 DIAGNOSIS — Z8616 Personal history of COVID-19: Secondary | ICD-10-CM | POA: Insufficient documentation

## 2021-07-29 DIAGNOSIS — R062 Wheezing: Secondary | ICD-10-CM | POA: Diagnosis not present

## 2021-07-29 DIAGNOSIS — Z7951 Long term (current) use of inhaled steroids: Secondary | ICD-10-CM | POA: Insufficient documentation

## 2021-07-29 DIAGNOSIS — J441 Chronic obstructive pulmonary disease with (acute) exacerbation: Secondary | ICD-10-CM

## 2021-07-29 DIAGNOSIS — Z7982 Long term (current) use of aspirin: Secondary | ICD-10-CM | POA: Diagnosis not present

## 2021-07-29 DIAGNOSIS — Z87891 Personal history of nicotine dependence: Secondary | ICD-10-CM | POA: Diagnosis not present

## 2021-07-29 DIAGNOSIS — R6889 Other general symptoms and signs: Secondary | ICD-10-CM | POA: Diagnosis not present

## 2021-07-29 DIAGNOSIS — Z79899 Other long term (current) drug therapy: Secondary | ICD-10-CM | POA: Insufficient documentation

## 2021-07-29 DIAGNOSIS — J449 Chronic obstructive pulmonary disease, unspecified: Secondary | ICD-10-CM | POA: Diagnosis not present

## 2021-07-29 DIAGNOSIS — R0602 Shortness of breath: Secondary | ICD-10-CM | POA: Diagnosis not present

## 2021-07-29 DIAGNOSIS — R29810 Facial weakness: Secondary | ICD-10-CM | POA: Diagnosis not present

## 2021-07-29 LAB — CBC WITH DIFFERENTIAL/PLATELET
Abs Immature Granulocytes: 0.02 10*3/uL (ref 0.00–0.07)
Basophils Absolute: 0.1 10*3/uL (ref 0.0–0.1)
Basophils Relative: 1 %
Eosinophils Absolute: 0.9 10*3/uL — ABNORMAL HIGH (ref 0.0–0.5)
Eosinophils Relative: 14 %
HCT: 32.8 % — ABNORMAL LOW (ref 36.0–46.0)
Hemoglobin: 10.2 g/dL — ABNORMAL LOW (ref 12.0–15.0)
Immature Granulocytes: 0 %
Lymphocytes Relative: 12 %
Lymphs Abs: 0.8 10*3/uL (ref 0.7–4.0)
MCH: 28.7 pg (ref 26.0–34.0)
MCHC: 31.1 g/dL (ref 30.0–36.0)
MCV: 92.4 fL (ref 80.0–100.0)
Monocytes Absolute: 0.5 10*3/uL (ref 0.1–1.0)
Monocytes Relative: 7 %
Neutro Abs: 4.2 10*3/uL (ref 1.7–7.7)
Neutrophils Relative %: 66 %
Platelets: 248 10*3/uL (ref 150–400)
RBC: 3.55 MIL/uL — ABNORMAL LOW (ref 3.87–5.11)
RDW: 14.4 % (ref 11.5–15.5)
WBC: 6.4 10*3/uL (ref 4.0–10.5)
nRBC: 0 % (ref 0.0–0.2)

## 2021-07-29 LAB — BASIC METABOLIC PANEL
Anion gap: 10 (ref 5–15)
BUN: 13 mg/dL (ref 8–23)
CO2: 30 mmol/L (ref 22–32)
Calcium: 8.8 mg/dL — ABNORMAL LOW (ref 8.9–10.3)
Chloride: 99 mmol/L (ref 98–111)
Creatinine, Ser: 0.68 mg/dL (ref 0.44–1.00)
GFR, Estimated: >60 mL/min (ref >60)
Glucose, Bld: 100 mg/dL — ABNORMAL HIGH (ref 70–99)
Potassium: 3.4 mmol/L — ABNORMAL LOW (ref 3.5–5.1)
Sodium: 139 mmol/L (ref 135–145)

## 2021-07-29 LAB — RESP PANEL BY RT-PCR (FLU A&B, COVID) ARPGX2
Influenza A by PCR: NEGATIVE
Influenza B by PCR: NEGATIVE
SARS Coronavirus 2 by RT PCR: NEGATIVE

## 2021-07-29 MED ORDER — IPRATROPIUM BROMIDE HFA 17 MCG/ACT IN AERS
2.0000 | INHALATION_SPRAY | Freq: Once | RESPIRATORY_TRACT | Status: AC
  Administered 2021-07-29: 2 via RESPIRATORY_TRACT
  Filled 2021-07-29: qty 12.9

## 2021-07-29 MED ORDER — METHYLPREDNISOLONE SODIUM SUCC 125 MG IJ SOLR
125.0000 mg | Freq: Once | INTRAMUSCULAR | Status: AC
  Administered 2021-07-29: 125 mg via INTRAVENOUS
  Filled 2021-07-29: qty 2

## 2021-07-29 MED ORDER — ALBUTEROL SULFATE HFA 108 (90 BASE) MCG/ACT IN AERS
4.0000 | INHALATION_SPRAY | Freq: Once | RESPIRATORY_TRACT | Status: AC
  Administered 2021-07-29: 4 via RESPIRATORY_TRACT
  Filled 2021-07-29: qty 6.7

## 2021-07-29 NOTE — ED Triage Notes (Signed)
Patient BIBA c/o SOB x2 days with hx COPD. Patient on 2L [place] at baseline. EMS reports expiratory wheezing and lung fields diminished. AOx4  BP 130/90, HR 100, RR 18, SpO2 96% 2L [place]   No treatments given

## 2021-07-29 NOTE — Telephone Encounter (Signed)
Called and spoke with patient's daughter [place]. She stated that she had taken the patient to the ED this morning just to get checked out. She was told that she was having a copd exacerbation and to continue the prednisone that BI had sent in yesterday. She is now back home and resting.   She wanted to know if it was safe for her to use the Duoneb solution while on the prednisone. I advised her that it was safe to do so. She verbalize understanding and stated that she would give a breathing treatment tonight before bed.   Nothing further needed at time of call.

## 2021-07-29 NOTE — ED Notes (Signed)
An After Visit Summary was printed and given to the patient. Discharge instructions given and no further questions at this time.  This RN spoke to pts granddaughter, who is bringing pt oxygen tank and will take pt home.

## 2021-07-29 NOTE — ED Provider Notes (Signed)
Is here for [facility] DEPT Provider Note   CSN: [id number] Arrival date & time: 07/29/21  [id number]     History Chief Complaint  Patient presents with   Shortness of Breath    Chloe Wilcox is a 86 y.o. female.  The history is provided by the patient.  Shortness of Breath Severity:  Mild Onset quality:  Gradual Duration:  3 days Timing:  Intermittent Progression:  Waxing and waning Chronicity:  Recurrent Context comment:  COPD type symptoms last few days, started on steroid yesterday afternoon, woke up with wheezing SOB and improved after albuterol Relieved by:  Inhaler Associated symptoms: wheezing   Associated symptoms: no abdominal pain, no chest pain, no cough, no ear pain, no fever, no rash, no sore throat, no sputum production and no vomiting   Risk factors: no hx of PE/DVT and no prolonged immobilization    ACS.  Felt better after breathing treatments.  Overall suspect COPD exacerbation.  No signs of respiratory distress.  Discharged in good condition.     Past Medical History:  Diagnosis Date   COPD (chronic obstructive pulmonary disease) ([place])    Depression    Hypertension     Patient Active Problem List   Diagnosis Date Noted   History of COVID-19 09/14/2020   Abnormal findings on diagnostic imaging of lung 09/14/2020   Hypoxemia 07/23/2020   Respiratory failure ([place]) 06/15/2019   Acute respiratory failure with hypoxia ([place]) 06/14/2019   COPD exacerbation ([place]) 06/14/2019   COPD with acute bronchitis ([place]) 06/14/2019   Hypokalemia 06/14/2019   Essential hypertension 06/14/2019    Past Surgical History:  Procedure Laterality Date   VAGINAL HYSTERECTOMY       OB History   No obstetric history on file.     Family History  Family history unknown: Yes    Social History   Tobacco Use   Smoking status: Former    Packs/day: 1.00    Years: 33.00    Pack years: 33.00    Types: Cigarettes    Start date: 11/28/1950     Quit date: 11/29/1983    Years since quitting: 37.6   Smokeless tobacco: Never  Vaping Use   Vaping Use: Never used  Substance Use Topics   Alcohol use: No   Drug use: No    Home Medications Prior to Admission medications   Medication Sig Start Date End Date Taking? Authorizing Provider  acetaminophen (TYLENOL) 650 MG CR tablet Take 650 mg by mouth every 8 (eight) hours as needed for pain (or headaches).     [provider]  albuterol (PROAIR HFA) 108 (90 Base) MCG/ACT inhaler Inhale 2 puffs into the lungs every 6 (six) hours as needed for wheezing or shortness of breath. 06/28/21   Icard, Octavio Graves, DO  albuterol (VENTOLIN HFA) 108 (90 Base) MCG/ACT inhaler Inhale 2 puffs into the lungs every 6 (six) hours as needed for wheezing or shortness of breath. 01/01/20   Icard, Octavio Graves, DO  amLODipine (NORVASC) 10 MG tablet Take 10 mg by mouth daily as needed (IF SYSTOLIC NUMBER IS AT LEAST 100 OR GREATER).  05/21/19   [provider]  aspirin 325 MG tablet Take 325 mg by mouth daily.    [provider]  atorvastatin (LIPITOR) 40 MG tablet Take 40 mg by mouth at bedtime.    [provider]  Budeson-Glycopyrrol-Formoterol (BREZTRI AEROSPHERE) 160-9-4.8 MCG/ACT AERO Inhale 2 puffs into the lungs in the morning and at bedtime.  02/10/21   Icard, Octavio Graves, DO  Budeson-Glycopyrrol-Formoterol (BREZTRI AEROSPHERE) 160-9-4.8 MCG/ACT AERO Inhale 2 puffs into the lungs in the morning and at bedtime. 07/27/21   Icard, Octavio Graves, DO  citalopram (CELEXA) 20 MG tablet Take 20 mg by mouth daily.    [provider]  diclofenac (VOLTAREN) 50 MG EC tablet Take 50 mg by mouth 2 (two) times daily.    [provider]  diclofenac Sodium (VOLTAREN) 1 % GEL Apply 2 g topically 4 (four) times daily as needed. 08/03/20   Long, Wonda Olds, MD  furosemide (LASIX) 20 MG tablet Take 20-40 mg by mouth See admin instructions. Take 40 mg by mouth in the morning and 20 mg at bedtime  11/25/19   [provider]  ipratropium-albuterol (DUONEB) 0.5-2.5 (3) MG/3ML SOLN Take 3 mLs by nebulization 2 (two) times daily. 07/14/21   Garner Nash, DO  Multiple Vitamin (MULTIVITAMIN) tablet Take 1 tablet by mouth daily.    [provider]  olmesartan (BENICAR) 20 MG tablet Take 10 mg by mouth at bedtime.     [provider]  predniSONE (DELTASONE) 10 MG tablet Take 4 tablets once daily for 5 days. 07/28/21   Garner Nash, DO  predniSONE (DELTASONE) 50 MG tablet 1 p.o. daily x5 09/03/20   Lacretia Leigh, MD  tiZANidine (ZANAFLEX) 2 MG tablet Take 2 mg by mouth every 8 (eight) hours as needed for muscle spasms.  12/02/19   [provider]  traMADol (ULTRAM) 50 MG tablet Take 50 mg by mouth daily as needed for moderate pain.    [provider]    Allergies    Patient has no known allergies.  Review of Systems   Review of Systems  Constitutional:  Negative for chills and fever.  HENT:  Negative for ear pain and sore throat.   Eyes:  Negative for pain and visual disturbance.  Respiratory:  Positive for shortness of breath and wheezing. Negative for cough and sputum production.   Cardiovascular:  Negative for chest pain, palpitations and leg swelling.  Gastrointestinal:  Negative for abdominal pain and vomiting.  Genitourinary:  Negative for dysuria and hematuria.  Musculoskeletal:  Negative for arthralgias and back pain.  Skin:  Negative for color change and rash.  Neurological:  Negative for seizures and syncope.  All other systems reviewed and are negative.  Physical Exam Updated Vital Signs BP (!) 142/68   Pulse 80   Temp 98 F (36.7 C) (Oral)   Resp 17   SpO2 98%   Physical Exam Vitals and nursing note reviewed.  Constitutional:      General: She is not in acute distress.    Appearance: She is well-developed. She is not ill-appearing.  HENT:     Head: Normocephalic and atraumatic.  Eyes:     Conjunctiva/sclera:  Conjunctivae normal.  Cardiovascular:     Rate and Rhythm: Normal rate and regular rhythm.     Pulses: Normal pulses.     Heart sounds: Normal heart sounds. No murmur heard. Pulmonary:     Effort: Pulmonary effort is normal. No respiratory distress.     Breath sounds: Wheezing present.  Abdominal:     Palpations: Abdomen is soft.     Tenderness: There is no abdominal tenderness.  Musculoskeletal:        General: Normal range of motion.     Cervical back: Neck supple.     Right lower leg: No edema.     Left lower  leg: No edema.  Skin:    General: Skin is warm and dry.     Capillary Refill: Capillary refill takes less than 2 seconds.  Neurological:     General: No focal deficit present.     Mental Status: She is alert.    ED Results / Procedures / Treatments   Labs (all labs ordered are listed, but only abnormal results are displayed) Labs Reviewed  CBC WITH DIFFERENTIAL/PLATELET - Abnormal; Notable for the following components:      Result Value   RBC 3.55 (*)    Hemoglobin 10.2 (*)    HCT 32.8 (*)    Eosinophils Absolute 0.9 (*)    All other components within normal limits  BASIC METABOLIC PANEL - Abnormal; Notable for the following components:   Potassium 3.4 (*)    Glucose, Bld 100 (*)    Calcium 8.8 (*)    All other components within normal limits  RESP PANEL BY RT-PCR (FLU A&B, COVID) [id number]    EKG EKG Interpretation  Date/Time:  Thursday July 29 2021 09:40:37 EDT Ventricular Rate:  90 PR Interval:  161 QRS Duration: 87 QT Interval:  360 QTC Calculation: 441 R Axis:   55 Text Interpretation: Sinus rhythm Confirmed by Lennice Sites ([id number]) on 07/29/2021 11:04:00 AM  Radiology DG Chest Portable 1 View  Result Date: 07/29/2021 CLINICAL DATA:  Shortness of breath, wheezing. EXAM: PORTABLE CHEST 1 VIEW COMPARISON:  September 03, 2020. FINDINGS: The heart size and mediastinal contours are within normal limits. Both lungs are clear. The visualized skeletal  structures are unremarkable. IMPRESSION: No active disease. Aortic Atherosclerosis (ICD10-I70.0). Electronically Signed   By: Marijo Conception M.D.   On: 07/29/2021 10:51    Procedures Procedures   Medications Ordered in ED Medications  albuterol (VENTOLIN HFA) 108 (90 Base) MCG/ACT inhaler 4 puff (4 puffs Inhalation Given 07/29/21 1005)  ipratropium (ATROVENT HFA) inhaler 2 puff (2 puffs Inhalation Given 07/29/21 1005)  methylPREDNISolone sodium succinate (SOLU-MEDROL) 125 mg/2 mL injection 125 mg (125 mg Intravenous Given 07/29/21 1005)    ED Course  I have reviewed the triage vital signs and the nursing notes.  Pertinent labs & imaging results that were available during my care of the patient were reviewed by me and considered in my medical decision making (see chart for details).    MDM Rules/Calculators/A&P                           Chloe Wilcox is here for shortness of breath.  Chronically on 2 L of oxygen for COPD.  Denies any chest pain, cough, fever, sputum production.  Normal vitals here.  No fever.  Feels better after breathing treatments with EMS.  Was started on steroids yesterday afternoon by pulmonologist.  Has not taken her morning dose.  We will give IV Solu-Medrol.  Will give further breathing treatments.  Looks comfortable with no signs of respiratory distress.  Stable on her 2 L of oxygen.  There is some mild wheezing.  She denies any chest pain.  No concern for ACS or PE.  Will check EKG, basic labs, chest x-ray, COVID test.  Does not sound like she has had change in her sputum production or cough.  No signs of volume overload on exam.  No leg swelling.  Will reevaluate after blood work, breathing treatment, steroids.  COVID test negative.  Labs unremarkable.  No sign of pneumonia.  Felt better after breathing treatment.  Overall suspect COPD exacerbation.  This chart was dictated using voice recognition software.  Despite best efforts to proofread,  errors can occur which  can change the documentation meaning.   Final Clinical Impression(s) / ED Diagnoses Final diagnoses:  COPD exacerbation [facility])    Rx / DC Orders ED Discharge Orders     None        Lennice Sites, DO 07/29/21 1129

## 2021-07-29 NOTE — Discharge Instructions (Addendum)
Continue prednisone as prescribed.  Recommend using inhaler with albuterol 4 puffs every 4 hours as needed.  COVID test was negative.  No signs of lung infection on exam.

## 2021-07-30 DIAGNOSIS — J449 Chronic obstructive pulmonary disease, unspecified: Secondary | ICD-10-CM | POA: Diagnosis not present

## 2021-07-30 DIAGNOSIS — D508 Other iron deficiency anemias: Secondary | ICD-10-CM | POA: Diagnosis not present

## 2021-07-30 DIAGNOSIS — I10 Essential (primary) hypertension: Secondary | ICD-10-CM | POA: Diagnosis not present

## 2021-07-30 DIAGNOSIS — E7800 Pure hypercholesterolemia, unspecified: Secondary | ICD-10-CM | POA: Diagnosis not present

## 2021-08-06 ENCOUNTER — Telehealth: Payer: Self-pay | Admitting: Pulmonary Disease

## 2021-08-06 NOTE — Telephone Encounter (Signed)
Spoke with [place]  She states that the pt is needing more pred  She was seen by her PCP for flare and he rec that we call in pred  Pt not seen since 09/2020 We already called in pred for her 8/31 and she states it never helped  I advised per last phone note that pt needs appt  Daugther understands and will call back to schedule after she looks at her schedule  There are openings with TP for this afternoon  Closing encounter

## 2021-08-09 DIAGNOSIS — G8929 Other chronic pain: Secondary | ICD-10-CM | POA: Diagnosis not present

## 2021-08-09 DIAGNOSIS — E7800 Pure hypercholesterolemia, unspecified: Secondary | ICD-10-CM | POA: Diagnosis not present

## 2021-08-09 DIAGNOSIS — M25511 Pain in right shoulder: Secondary | ICD-10-CM | POA: Diagnosis not present

## 2021-08-09 DIAGNOSIS — M25512 Pain in left shoulder: Secondary | ICD-10-CM | POA: Diagnosis not present

## 2021-08-09 DIAGNOSIS — M6281 Muscle weakness (generalized): Secondary | ICD-10-CM | POA: Diagnosis not present

## 2021-08-09 DIAGNOSIS — J449 Chronic obstructive pulmonary disease, unspecified: Secondary | ICD-10-CM | POA: Diagnosis not present

## 2021-08-09 DIAGNOSIS — I10 Essential (primary) hypertension: Secondary | ICD-10-CM | POA: Diagnosis not present

## 2021-08-09 DIAGNOSIS — D509 Iron deficiency anemia, unspecified: Secondary | ICD-10-CM | POA: Diagnosis not present

## 2021-08-09 DIAGNOSIS — M1990 Unspecified osteoarthritis, unspecified site: Secondary | ICD-10-CM | POA: Diagnosis not present

## 2021-08-10 ENCOUNTER — Telehealth: Payer: Self-pay | Admitting: Pulmonary Disease

## 2021-08-10 MED ORDER — BREZTRI AEROSPHERE 160-9-4.8 MCG/ACT IN AERO: 2.0000 | INHALATION_SPRAY | Freq: Two times a day (BID) | RESPIRATORY_TRACT | 0 refills | Status: DC

## 2021-08-10 NOTE — Telephone Encounter (Signed)
Samples have been placed up front for patient.

## 2021-08-10 NOTE — Telephone Encounter (Signed)
Called [employer] and received an automated message saying pt was approved but they are needing additional information. Waited on hold for over 30 min to speak to rep, still unable to speak to anyone. Called pt's daughter, [name], and was advised that she has already spoken to a rep to give the needed information but the pt's med still has not been shipped. A fax has been sent to [employer] inquiring what the delay is and to reach out to [place] with information so pt can get her inhaler, Breztri. Samples have been left up front for pick up as pt is running low. Will leave encounter open to follow back up.

## 2021-08-16 DIAGNOSIS — J449 Chronic obstructive pulmonary disease, unspecified: Secondary | ICD-10-CM | POA: Diagnosis not present

## 2021-08-16 DIAGNOSIS — M6281 Muscle weakness (generalized): Secondary | ICD-10-CM | POA: Diagnosis not present

## 2021-08-16 DIAGNOSIS — M25511 Pain in right shoulder: Secondary | ICD-10-CM | POA: Diagnosis not present

## 2021-08-16 DIAGNOSIS — D509 Iron deficiency anemia, unspecified: Secondary | ICD-10-CM | POA: Diagnosis not present

## 2021-08-16 DIAGNOSIS — G8929 Other chronic pain: Secondary | ICD-10-CM | POA: Diagnosis not present

## 2021-08-16 DIAGNOSIS — E7800 Pure hypercholesterolemia, unspecified: Secondary | ICD-10-CM | POA: Diagnosis not present

## 2021-08-16 DIAGNOSIS — I10 Essential (primary) hypertension: Secondary | ICD-10-CM | POA: Diagnosis not present

## 2021-08-16 DIAGNOSIS — M25512 Pain in left shoulder: Secondary | ICD-10-CM | POA: Diagnosis not present

## 2021-08-16 DIAGNOSIS — M1990 Unspecified osteoarthritis, unspecified site: Secondary | ICD-10-CM | POA: Diagnosis not present

## 2021-08-17 ENCOUNTER — Telehealth: Payer: Self-pay | Admitting: Pulmonary Disease

## 2021-08-17 NOTE — Telephone Encounter (Signed)
Received 90 day supply Breztri for pt from [place] [facility]  Called and spoke with her daughter, [name], ok per DPR  She is going to pick up for the pt- placed up front and she is going to call [place] and me to ensure samples get mailed to pt's home next refill.

## 2021-08-18 ENCOUNTER — Telehealth: Payer: Self-pay | Admitting: Pulmonary Disease

## 2021-08-18 NOTE — Telephone Encounter (Signed)
Will need to fax over last OV note to [facility] [facility] tomorrow.

## 2021-08-19 NOTE — Telephone Encounter (Signed)
Spoke with the pt's daughter  Pt uses [employer] for o2  States she needs to be re certified for this  OV with TP for 08/24/21 for this purpose and bc she was overdue rov

## 2021-08-24 ENCOUNTER — Ambulatory Visit: Payer: Medicare Other | Admitting: Adult Health

## 2021-08-24 NOTE — Telephone Encounter (Signed)
See phone note from 08/17/21. Will close encounter.

## 2021-08-28 DIAGNOSIS — J441 Chronic obstructive pulmonary disease with (acute) exacerbation: Secondary | ICD-10-CM | POA: Diagnosis not present

## 2021-08-28 DIAGNOSIS — J9690 Respiratory failure, unspecified, unspecified whether with hypoxia or hypercapnia: Secondary | ICD-10-CM | POA: Diagnosis not present

## 2021-08-31 ENCOUNTER — Ambulatory Visit (HOSPITAL_COMMUNITY): Payer: Medicare Other

## 2021-09-01 ENCOUNTER — Encounter (HOSPITAL_COMMUNITY): Payer: Self-pay

## 2021-09-01 ENCOUNTER — Ambulatory Visit (HOSPITAL_COMMUNITY)
Admission: RE | Admit: 2021-09-01 | Discharge: 2021-09-01 | Disposition: A | Discharge status: Home / Self Care | Payer: Medicare Other | Source: Ambulatory Visit | Attending: Pulmonary Disease | Admitting: Pulmonary Disease

## 2021-09-01 ENCOUNTER — Other Ambulatory Visit: Payer: Self-pay

## 2021-09-01 ENCOUNTER — Telehealth: Payer: Self-pay | Admitting: Pulmonary Disease

## 2021-09-01 DIAGNOSIS — R911 Solitary pulmonary nodule: Secondary | ICD-10-CM | POA: Insufficient documentation

## 2021-09-01 DIAGNOSIS — I700 Atherosclerosis of aorta: Secondary | ICD-10-CM | POA: Diagnosis not present

## 2021-09-01 DIAGNOSIS — J9811 Atelectasis: Secondary | ICD-10-CM | POA: Diagnosis not present

## 2021-09-02 NOTE — Telephone Encounter (Signed)
Called and spoke with patient's daughter [place]. She was requesting an order for a POC. I advised her that since she has not been seen in our office since last year, she will need to have an OV as well as a walk. She verbalized understanding.   I was able to get her scheduled for 09/23/21 at 1115am with BI.   Nothing further needed at time of call.

## 2021-09-23 ENCOUNTER — Ambulatory Visit: Payer: Medicare Other | Admitting: Pulmonary Disease

## 2021-09-24 DIAGNOSIS — R059 Cough, unspecified: Secondary | ICD-10-CM | POA: Diagnosis not present

## 2021-09-24 DIAGNOSIS — Z8709 Personal history of other diseases of the respiratory system: Secondary | ICD-10-CM | POA: Diagnosis not present

## 2021-09-24 DIAGNOSIS — R0981 Nasal congestion: Secondary | ICD-10-CM | POA: Diagnosis not present

## 2021-09-28 DIAGNOSIS — J441 Chronic obstructive pulmonary disease with (acute) exacerbation: Secondary | ICD-10-CM | POA: Diagnosis not present

## 2021-09-28 DIAGNOSIS — J9690 Respiratory failure, unspecified, unspecified whether with hypoxia or hypercapnia: Secondary | ICD-10-CM | POA: Diagnosis not present

## 2021-10-11 ENCOUNTER — Encounter: Payer: Self-pay | Admitting: Pulmonary Disease

## 2021-10-11 ENCOUNTER — Other Ambulatory Visit: Payer: Self-pay

## 2021-10-11 ENCOUNTER — Ambulatory Visit (INDEPENDENT_AMBULATORY_CARE_PROVIDER_SITE_OTHER): Payer: Medicare Other | Admitting: Pulmonary Disease

## 2021-10-11 VITALS — BP 124/62 | HR 88 | Temp 97.9°F | Ht 66.0 in | Wt 128.8 lb

## 2021-10-11 DIAGNOSIS — J9611 Chronic respiratory failure with hypoxia: Secondary | ICD-10-CM | POA: Diagnosis not present

## 2021-10-11 DIAGNOSIS — Z87891 Personal history of nicotine dependence: Secondary | ICD-10-CM | POA: Diagnosis not present

## 2021-10-11 DIAGNOSIS — R911 Solitary pulmonary nodule: Secondary | ICD-10-CM

## 2021-10-11 NOTE — Progress Notes (Signed)
Synopsis: Referred in September 2020 for hypoxemia by [name], MD  Subjective:   PATIENT ID: Chloe Wilcox GENDER: female DOB: 1934/12/13, MRN: [id number]  Chief Complaint  Patient presents with   Follow-up    This is a 86 year old female past medical history of depression and hypertension.  Recently hospitalized in July 2020.  She is a former smoker.  She had increased shortness of breath and cough with sputum production.  Diagnosed with possible COPD exacerbation.  She was found to be hypoxemic 85% on room air requiring 2 L nasal cannula.  She was treated with IV steroids and bronchodilators.  Patient slowly improved was discharged on June 16, 2019 home with home health.  Patient was still requiring oxygen and recommended outpatient pulmonary follow-up.  COVID testing on admission negative.  Patient had chest x-ray on admission with no infiltrate.  Echocardiogram with preserved ejection fraction, grade 1 diastolic dysfunction, moderate pulmonary hypertension RVSP 55.  OV 08/07/2019: Patient doing well today.  Has been using her oxygen at home as directed.  Wants to know if she can come off of this.  Recently diagnosed with enlarging 9.5 cm pelvic mass.  Seen by urogynecology at [facility].  They are making plans for surgical removal of this.  Looking for pulmonary risk stratification peri-op eval.  She is a former smoker.  Does have daily cough sputum production.  She is doing much better since her hospitalization.  Denies fevers chills night sweats weight loss.  Denies hemoptysis.  She has been using Spiriva HandiHaler plus Flovent.  Occasionally needing to use her albuterol.  She gets winded with short distances.  Has dyspnea on exertion.  OV 09/25/2019: Presents today for follow-up after pulmonary function test.  Patient attempted pulmonary function test today in the office and was unable to have this completed.  She had several attempts but had much difficulty with the coordination of  completing the pulmonary function test.  At this time she has not been using any of the oxygen available or needs at home.  Her O2 sats have been stable.  She is doing well with the Anoro Ellipta inhaler that was given to her.  Otherwise her respiratory symptoms are stable and have not had any change or decline since her last office visit.  She is still working on planning for the pelvic procedure.  For removal of the pelvic mass.  OV 01/01/2020: Patient seen today in the office for follow-up regarding her COPD.  This is a presumed diagnosis.  She was unable to have pulmonary function test that were completed or/attempted at her last office visit.  She has been seen by surgery with concern of a pelvic mass.  She states they have declined on removal of this.  She is no longer on oxygen for the patient.  Stated that she had not needed it any further.  Had several questions today as well as vaccine counseling regarding the Covid vaccine which she is scheduled to receive this Saturday.  OV 10/15/2020: Patient was seen in the office in August 2021 for his COPD exacerbation.  Also had monoclonal antibody infusion on 09/01/2020 after or Covid positive family exposure.  Patient was admitted to the emergency department for CAP exacerbation evaluation in September 03, 2020.  With follow-up in the office on September 14, 2020 and saw Wyn Quaker, NP.  She has a history of COVID-19.  Chronic respiratory failure on oxygen therapy.  OV 10/11/2021: Here today for follow-up for COPD and  chronic hypoxemic respiratory failure.  Patient last seen in the office in November of last year.  Needs to be walk today for O2 qualification.  In addition, the patient is being followed for a pulmonary nodule.  Patient had a CT scan of the chest on 09/01/2021.  This revealed a 4 mm peripheral right upper lobe nodule seen on the previous study which appeared more linear potentially relating to underlying scarring.  Radiologist still recommend a 1 year  CT for documentation of 2-year stability.  We reviewed CT imaging today in the office with patient.  From a respiratory standpoint she is doing okay.   Past Medical History:  Diagnosis Date   COPD (chronic obstructive pulmonary disease) ([place])    Depression    Hypertension      Family History  Family history unknown: Yes     Past Surgical History:  Procedure Laterality Date   VAGINAL HYSTERECTOMY      Social History   Socioeconomic History   Marital status: Married    Spouse name: Not on file   Number of children: Not on file   Years of education: Not on file   Highest education level: Not on file  Occupational History   Not on file  Tobacco Use   Smoking status: Former    Packs/day: 1.00    Years: 33.00    Pack years: 33.00    Types: Cigarettes    Start date: 11/28/1950    Quit date: 11/29/1983    Years since quitting: 37.8   Smokeless tobacco: Never  Vaping Use   Vaping Use: Never used  Substance and Sexual Activity   Alcohol use: No   Drug use: No   Sexual activity: Not on file  Other Topics Concern   Not on file  Social History Narrative   Not on file   Social Determinants of Health   Financial Resource Strain: Not on file  Food Insecurity: Not on file  Transportation Needs: Not on file  Physical Activity: Not on file  Stress: Not on file  Social Connections: Not on file  Intimate Partner Violence: Not on file     No Known Allergies   Outpatient Medications Prior to Visit  Medication Sig Dispense Refill   acetaminophen (TYLENOL) 650 MG CR tablet Take 650 mg by mouth every 8 (eight) hours as needed for pain (or headaches).      albuterol (PROAIR HFA) 108 (90 Base) MCG/ACT inhaler Inhale 2 puffs into the lungs every 6 (six) hours as needed for wheezing or shortness of breath. 1 each 5   albuterol (VENTOLIN HFA) 108 (90 Base) MCG/ACT inhaler Inhale 2 puffs into the lungs every 6 (six) hours as needed for wheezing or shortness of breath. 18 g 2    amLODipine (NORVASC) 10 MG tablet Take 10 mg by mouth daily as needed (IF SYSTOLIC NUMBER IS AT LEAST 100 OR GREATER).      aspirin 325 MG tablet Take 325 mg by mouth daily.     atorvastatin (LIPITOR) 40 MG tablet Take 40 mg by mouth at bedtime.     Budeson-Glycopyrrol-Formoterol (BREZTRI AEROSPHERE) 160-9-4.8 MCG/ACT AERO Inhale 2 puffs into the lungs in the morning and at bedtime. 10.7 g 5   Budeson-Glycopyrrol-Formoterol (BREZTRI AEROSPHERE) 160-9-4.8 MCG/ACT AERO Inhale 2 puffs into the lungs in the morning and at bedtime. 32.1 g 3   Budeson-Glycopyrrol-Formoterol (BREZTRI AEROSPHERE) 160-9-4.8 MCG/ACT AERO Inhale 2 puffs into the lungs in the morning and at bedtime. 11.8 g 0  citalopram (CELEXA) 20 MG tablet Take 20 mg by mouth daily.     diclofenac (VOLTAREN) 50 MG EC tablet Take 50 mg by mouth 2 (two) times daily.     diclofenac Sodium (VOLTAREN) 1 % GEL Apply 2 g topically 4 (four) times daily as needed. 50 g 0   furosemide (LASIX) 20 MG tablet Take 20-40 mg by mouth See admin instructions. Take 40 mg by mouth in the morning and 20 mg at bedtime     ipratropium-albuterol (DUONEB) 0.5-2.5 (3) MG/3ML SOLN Take 3 mLs by nebulization 2 (two) times daily. 360 mL 6   Multiple Vitamin (MULTIVITAMIN) tablet Take 1 tablet by mouth daily.     olmesartan (BENICAR) 20 MG tablet Take 10 mg by mouth at bedtime.     predniSONE (DELTASONE) 10 MG tablet Take 4 tablets once daily for 5 days. 20 tablet 0   predniSONE (DELTASONE) 50 MG tablet 1 p.o. daily x5 5 tablet 0   tiZANidine (ZANAFLEX) 2 MG tablet Take 2 mg by mouth every 8 (eight) hours as needed for muscle spasms.      traMADol (ULTRAM) 50 MG tablet Take 50 mg by mouth daily as needed for moderate pain.     No facility-administered medications prior to visit.    Review of Systems  Constitutional:  Negative for chills, fever, malaise/fatigue and weight loss.  HENT:  Negative for hearing loss, sore throat and tinnitus.   Eyes:  Negative for  blurred vision and double vision.  Respiratory:  Positive for cough and shortness of breath. Negative for hemoptysis, sputum production, wheezing and stridor.   Cardiovascular:  Negative for chest pain, palpitations, orthopnea, leg swelling and PND.  Gastrointestinal:  Negative for abdominal pain, constipation, diarrhea, heartburn, nausea and vomiting.  Genitourinary:  Negative for dysuria, hematuria and urgency.  Musculoskeletal:  Negative for joint pain and myalgias.  Skin:  Negative for itching and rash.  Neurological:  Negative for dizziness, tingling, weakness and headaches.  Endo/Heme/Allergies:  Negative for environmental allergies. Does not bruise/bleed easily.  Psychiatric/Behavioral:  Negative for depression. The patient is not nervous/anxious and does not have insomnia.   All other systems reviewed and are negative.   Objective:  Physical Exam Vitals reviewed.  Constitutional:      General: She is not in acute distress.    Appearance: She is well-developed.     Comments: Thin frail elderly female  HENT:     Head: Normocephalic and atraumatic.  Eyes:     General: No scleral icterus.    Conjunctiva/sclera: Conjunctivae normal.     Pupils: Pupils are equal, round, and reactive to light.  Neck:     Vascular: No JVD.     Trachea: No tracheal deviation.  Cardiovascular:     Rate and Rhythm: Normal rate and regular rhythm.     Heart sounds: Normal heart sounds. No murmur heard. Pulmonary:     Effort: Pulmonary effort is normal. No tachypnea, accessory muscle usage or respiratory distress.     Breath sounds: No stridor. No wheezing, rhonchi or rales.     Comments: Diminished breath sounds bilaterally in the bases Abdominal:     General: Bowel sounds are normal. There is no distension.     Palpations: Abdomen is soft.     Tenderness: There is no abdominal tenderness.  Musculoskeletal:        General: No tenderness.     Cervical back: Neck supple.  Lymphadenopathy:      Cervical: No cervical adenopathy.  Skin:    General: Skin is warm and dry.     Capillary Refill: Capillary refill takes less than 2 seconds.     Findings: No rash.  Neurological:     Mental Status: She is alert and oriented to person, place, and time.  Psychiatric:        Behavior: Behavior normal.     Vitals:   10/11/21 1209  BP: 124/62  Pulse: 88  Temp: 97.9 F (36.6 C)  TempSrc: Oral  SpO2: 100%  Weight: 128 lb 12.8 oz (58.4 kg)  Height: 5\' 6"  (1.676 m)   100% on room air BMI Readings from Last 3 Encounters:  10/11/21 20.79 kg/m  10/15/20 24.05 kg/m  09/14/20 23.54 kg/m   Wt Readings from Last 3 Encounters:  10/11/21 128 lb 12.8 oz (58.4 kg)  10/15/20 139 lb 2 oz (63.1 kg)  09/14/20 136 lb 3.2 oz (61.8 kg)     CBC    Component Value Date/Time   WBC 6.4 07/29/2021 0950   RBC 3.55 (L) 07/29/2021 0950   HGB 10.2 (L) 07/29/2021 0950   HCT 32.8 (L) 07/29/2021 0950   PLT 248 07/29/2021 0950   MCV 92.4 07/29/2021 0950   MCH 28.7 07/29/2021 0950   MCHC 31.1 07/29/2021 0950   RDW 14.4 07/29/2021 0950   LYMPHSABS 0.8 07/29/2021 0950   MONOABS 0.5 07/29/2021 0950   EOSABS 0.9 (H) 07/29/2021 0950   BASOSABS 0.1 07/29/2021 0950    Chest Imaging: Chest x-ray 06/14/2019: No infiltrate. The patient's images have been independently reviewed by me.    CT scan of the chest October 2021: New right upper lobe 4 mm pulmonary nodule.  Evidence of emphysema. The patient's images have been independently reviewed by me.    CT chest October 2022: Lung nodule appears stable. Repeat CT in 1 year The patient's images have been independently reviewed by me.    Pulmonary Functions Testing Results: No flowsheet data found.  FeNO: None   Pathology: None   Echocardiogram:  July 2020: Preserved ejection fraction 55 to 08%, grade 1 diastolic dysfunction, moderate pulmonary hypertension RVSP 55.  Heart Catheterization: None     Assessment & Plan:      ICD-10-CM   1.  Chronic respiratory failure with hypoxia (HCC)  J96.11     2. Former smoker  Z87.891     3. Right upper lobe pulmonary nodule  R91.1       Discussion:  This is an 86 year old female, former smoker, quit in the 1980s, probable COPD unable to complete PFTs on previous office attempt.  She likely has COPD I do think trying to stress her to do PFTs again makes any difference in her management.  We have been following a pulmonary nodule.  Repeat CT scan this October reveals stability in this nodule  Plan: She has chronic hypoxemic respiratory failure and needs to be walked for her O2 needs. We will attempt to qualify her for O2 today in the office as well as a repeat POC evaluation. She would like to have a portable option if possible. As for her lung nodule she will repeat noncontrasted CT scan of the chest in 1 year to document 2-year stability.  If stable at that point she will need no additional follow-up.  Continue Breztri inhaler Continue albuterol Return to clinic in 1 year after CT scan or as needed.  I spent 32 minutes dedicated to the care of this patient on the date of this encounter to  include pre-visit review of records, face-to-face time with the patient discussing conditions above, post visit ordering of testing, clinical documentation with the electronic health record, making appropriate referrals as documented, and communicating necessary findings to members of the patients care team.    Garner Nash, DO [facility] 10/11/2021 12:13 PM

## 2021-10-11 NOTE — Patient Instructions (Signed)
Thank you for visiting Dr. Valeta Harms at [facility]. Today we recommend the following:  Orders Placed This Encounter  Procedures   CT CHEST WO CONTRAST   Continue breztri  Walk for O2 needs and POC    Return in about 1 year (around 10/11/2022), or if symptoms worsen or fail to improve, for with APP or Dr. Valeta Harms.    Please do your part to reduce the spread of COVID-19.

## 2021-10-13 ENCOUNTER — Telehealth: Payer: Self-pay | Admitting: Pulmonary Disease

## 2021-10-13 NOTE — Telephone Encounter (Signed)
   Chloe Wilcox, Sandrea Hammond; [name] Patient won't qualify for a POC through [place] unless she wants to private pay for it. I can set her up for an evaluation for smaller tanks.

## 2021-10-13 NOTE — Telephone Encounter (Signed)
I have called and spoke with pts daughter and she is aware of what [employer] stated.  She is going to call them to clarify and if needed she will go through another company.

## 2021-10-15 ENCOUNTER — Telehealth: Payer: Self-pay | Admitting: Pulmonary Disease

## 2021-10-15 DIAGNOSIS — J9601 Acute respiratory failure with hypoxia: Secondary | ICD-10-CM

## 2021-10-15 NOTE — Telephone Encounter (Signed)
Call made to daughter [name] [facility]), confirmed patient DOB. They are requesting oxygen order be sent to [facility].   BI please advise if ok to send order to [facility]. Thanks :)

## 2021-10-18 NOTE — Telephone Encounter (Signed)
Order has been signed and sent to [facility].  Nothing further is needed.

## 2021-10-19 ENCOUNTER — Telehealth: Payer: Self-pay | Admitting: Pulmonary Disease

## 2021-10-19 DIAGNOSIS — J9601 Acute respiratory failure with hypoxia: Secondary | ICD-10-CM

## 2021-10-19 NOTE — Telephone Encounter (Signed)
BI please advise if further testing is going to be scheduled.  Per [facility] they picked the oxygen up in 2020 so [facility] will need:   NEW ORDERS WITH TESTING AND CN FOR A NEW SET UP

## 2021-10-19 NOTE — Telephone Encounter (Signed)
This is the response from [place] with [facility] for the DME POC/O2 order:    From: Jaynee Eagles  Sent: 10/18/2021   4:42 PM EST  To: Auburn Bilberry, Satira Anis  Subject: RE: O2/Best Fit                                 PT'S OXYGEN WAS PICKED UP IN 09/2019.Marland KitchenMarland KitchenWE WOULD NEED NEW ORDERS WITH TESTING AND CN FOR A NEW SET UP   THANKS

## 2021-10-19 NOTE — Telephone Encounter (Signed)
Terri from [facility] needs clarification of POC order. Terri phone number is [phone].

## 2021-10-19 NOTE — Telephone Encounter (Signed)
Per BI:  Icard, Octavio Graves, DO  Elie Confer, LPN; P [facility] Triage Pool Caller: Unspecified (Today,  8:02 AM) Ok to bring in for walk o2 needs with nurse visit and ONO on room air  Thanks  BLI    I have called the pts daughter and she stated that the pt is already with [facility] and she has the large POC.  She stated that she had already spoken to them and that she had the payments set up already.    I called [facility] and they stated that they do not have any equipment for the pt.  She is with [facility] and not [facility].  She stated that they would need to have testing done to get the oxygen set up.     Pts daughter will call back to get the walk test scheduled as her daughter will have to bring the pt around her work schedule.

## 2021-10-28 DIAGNOSIS — J9690 Respiratory failure, unspecified, unspecified whether with hypoxia or hypercapnia: Secondary | ICD-10-CM | POA: Diagnosis not present

## 2021-10-28 DIAGNOSIS — J441 Chronic obstructive pulmonary disease with (acute) exacerbation: Secondary | ICD-10-CM | POA: Diagnosis not present

## 2021-11-28 DIAGNOSIS — J9690 Respiratory failure, unspecified, unspecified whether with hypoxia or hypercapnia: Secondary | ICD-10-CM | POA: Diagnosis not present

## 2021-11-28 DIAGNOSIS — J441 Chronic obstructive pulmonary disease with (acute) exacerbation: Secondary | ICD-10-CM | POA: Diagnosis not present

## 2021-12-13 ENCOUNTER — Ambulatory Visit: Payer: Medicare Other | Admitting: Primary Care

## 2021-12-29 DIAGNOSIS — J441 Chronic obstructive pulmonary disease with (acute) exacerbation: Secondary | ICD-10-CM | POA: Diagnosis not present

## 2021-12-29 DIAGNOSIS — J9690 Respiratory failure, unspecified, unspecified whether with hypoxia or hypercapnia: Secondary | ICD-10-CM | POA: Diagnosis not present

## 2022-01-03 DIAGNOSIS — I10 Essential (primary) hypertension: Secondary | ICD-10-CM | POA: Diagnosis not present

## 2022-01-03 DIAGNOSIS — J449 Chronic obstructive pulmonary disease, unspecified: Secondary | ICD-10-CM | POA: Diagnosis not present

## 2022-01-03 DIAGNOSIS — M159 Polyosteoarthritis, unspecified: Secondary | ICD-10-CM | POA: Diagnosis not present

## 2022-01-03 DIAGNOSIS — R600 Localized edema: Secondary | ICD-10-CM | POA: Diagnosis not present

## 2022-01-03 DIAGNOSIS — E7800 Pure hypercholesterolemia, unspecified: Secondary | ICD-10-CM | POA: Diagnosis not present

## 2022-01-03 DIAGNOSIS — D509 Iron deficiency anemia, unspecified: Secondary | ICD-10-CM | POA: Diagnosis not present

## 2022-01-14 ENCOUNTER — Telehealth: Payer: Self-pay | Admitting: Pulmonary Disease

## 2022-01-14 ENCOUNTER — Ambulatory Visit: Payer: Medicare Other | Admitting: Pulmonary Disease

## 2022-01-14 NOTE — Progress Notes (Incomplete)
Synopsis: Referred in September 2020 for hypoxemia by [name], MD  Subjective:   PATIENT ID: Chloe Wilcox GENDER: female DOB: 02-24-1935, MRN: [id number]  No chief complaint on file.   This is a 87 year old female past medical history of depression and hypertension.  Recently hospitalized in July 2020.  She is a former smoker.  She had increased shortness of breath and cough with sputum production.  Diagnosed with possible COPD exacerbation.  She was found to be hypoxemic 85% on room air requiring 2 L nasal cannula.  She was treated with IV steroids and bronchodilators.  Patient slowly improved was discharged on June 16, 2019 home with home health.  Patient was still requiring oxygen and recommended outpatient pulmonary follow-up.  COVID testing on admission negative.  Patient had chest x-ray on admission with no infiltrate.  Echocardiogram with preserved ejection fraction, grade 1 diastolic dysfunction, moderate pulmonary hypertension RVSP 55.  OV 08/07/2019: Patient doing well today.  Has been using her oxygen at home as directed.  Wants to know if she can come off of this.  Recently diagnosed with enlarging 9.5 cm pelvic mass.  Seen by urogynecology at [facility].  They are making plans for surgical removal of this.  Looking for pulmonary risk stratification peri-op eval.  She is a former smoker.  Does have daily cough sputum production.  She is doing much better since her hospitalization.  Denies fevers chills night sweats weight loss.  Denies hemoptysis.  She has been using Spiriva HandiHaler plus Flovent.  Occasionally needing to use her albuterol.  She gets winded with short distances.  Has dyspnea on exertion.  OV 09/25/2019: Presents today for follow-up after pulmonary function test.  Patient attempted pulmonary function test today in the office and was unable to have this completed.  She had several attempts but had much difficulty with the coordination of completing the pulmonary  function test.  At this time she has not been using any of the oxygen available or needs at home.  Her O2 sats have been stable.  She is doing well with the Anoro Ellipta inhaler that was given to her.  Otherwise her respiratory symptoms are stable and have not had any change or decline since her last office visit.  She is still working on planning for the pelvic procedure.  For removal of the pelvic mass.  OV 01/01/2020: Patient seen today in the office for follow-up regarding her COPD.  This is a presumed diagnosis.  She was unable to have pulmonary function test that were completed or/attempted at her last office visit.  She has been seen by surgery with concern of a pelvic mass.  She states they have declined on removal of this.  She is no longer on oxygen for the patient.  Stated that she had not needed it any further.  Had several questions today as well as vaccine counseling regarding the Covid vaccine which she is scheduled to receive this Saturday.  OV 10/15/2020: Patient was seen in the office in August 2021 for his COPD exacerbation.  Also had monoclonal antibody infusion on 09/01/2020 after or Covid positive family exposure.  Patient was admitted to the emergency department for CAP exacerbation evaluation in September 03, 2020.  With follow-up in the office on September 14, 2020 and saw Wyn Quaker, NP.  She has a history of COVID-19.  Chronic respiratory failure on oxygen therapy.  OV 10/11/2021: Here today for follow-up for COPD and chronic hypoxemic respiratory failure.  Patient last seen in the office in November of last year.  Needs to be walk today for O2 qualification.  In addition, the patient is being followed for a pulmonary nodule.  Patient had a CT scan of the chest on 09/01/2021.  This revealed a 4 mm peripheral right upper lobe nodule seen on the previous study which appeared more linear potentially relating to underlying scarring.  Radiologist still recommend a 1 year CT for documentation of  2-year stability.  We reviewed CT imaging today in the office with patient.  From a respiratory standpoint she is doing okay.  OV 02/12/2019 2020: Here today for follow-up regarding COPD and chronic respiratory failure on oxygen therapy. ***   Past Medical History:  Diagnosis Date   COPD (chronic obstructive pulmonary disease) ([place])    Depression    Hypertension      Family History  Family history unknown: Yes     Past Surgical History:  Procedure Laterality Date   VAGINAL HYSTERECTOMY      Social History   Socioeconomic History   Marital status: Married    Spouse name: Not on file   Number of children: Not on file   Years of education: Not on file   Highest education level: Not on file  Occupational History   Not on file  Tobacco Use   Smoking status: Former    Packs/day: 1.00    Years: 33.00    Pack years: 33.00    Types: Cigarettes    Start date: 11/28/1950    Quit date: 11/29/1983    Years since quitting: 38.1   Smokeless tobacco: Never  Vaping Use   Vaping Use: Never used  Substance and Sexual Activity   Alcohol use: No   Drug use: No   Sexual activity: Not on file  Other Topics Concern   Not on file  Social History Narrative   Not on file   Social Determinants of Health   Financial Resource Strain: Not on file  Food Insecurity: Not on file  Transportation Needs: Not on file  Physical Activity: Not on file  Stress: Not on file  Social Connections: Not on file  Intimate Partner Violence: Not on file     No Known Allergies   Outpatient Medications Prior to Visit  Medication Sig Dispense Refill   acetaminophen (TYLENOL) 650 MG CR tablet Take 650 mg by mouth every 8 (eight) hours as needed for pain (or headaches).      albuterol (PROAIR HFA) 108 (90 Base) MCG/ACT inhaler Inhale 2 puffs into the lungs every 6 (six) hours as needed for wheezing or shortness of breath. 1 each 5   albuterol (VENTOLIN HFA) 108 (90 Base) MCG/ACT inhaler  Inhale 2 puffs into the lungs every 6 (six) hours as needed for wheezing or shortness of breath. 18 g 2   amLODipine (NORVASC) 10 MG tablet Take 10 mg by mouth daily as needed (IF SYSTOLIC NUMBER IS AT LEAST 100 OR GREATER).      aspirin 325 MG tablet Take 325 mg by mouth daily.     atorvastatin (LIPITOR) 40 MG tablet Take 40 mg by mouth at bedtime.     Budeson-Glycopyrrol-Formoterol (BREZTRI AEROSPHERE) 160-9-4.8 MCG/ACT AERO Inhale 2 puffs into the lungs in the morning and at bedtime. 10.7 g 5   Budeson-Glycopyrrol-Formoterol (BREZTRI AEROSPHERE) 160-9-4.8 MCG/ACT AERO Inhale 2 puffs into the lungs in the morning and at bedtime. 32.1 g 3   Budeson-Glycopyrrol-Formoterol (BREZTRI AEROSPHERE) 160-9-4.8 MCG/ACT AERO Inhale 2  puffs into the lungs in the morning and at bedtime. 11.8 g 0   citalopram (CELEXA) 20 MG tablet Take 20 mg by mouth daily.     diclofenac (VOLTAREN) 50 MG EC tablet Take 50 mg by mouth 2 (two) times daily.     diclofenac Sodium (VOLTAREN) 1 % GEL Apply 2 g topically 4 (four) times daily as needed. 50 g 0   furosemide (LASIX) 20 MG tablet Take 20-40 mg by mouth See admin instructions. Take 40 mg by mouth in the morning and 20 mg at bedtime     ipratropium-albuterol (DUONEB) 0.5-2.5 (3) MG/3ML SOLN Take 3 mLs by nebulization 2 (two) times daily. 360 mL 6   Multiple Vitamin (MULTIVITAMIN) tablet Take 1 tablet by mouth daily.     olmesartan (BENICAR) 20 MG tablet Take 10 mg by mouth at bedtime.     predniSONE (DELTASONE) 10 MG tablet Take 4 tablets once daily for 5 days. 20 tablet 0   predniSONE (DELTASONE) 50 MG tablet 1 p.o. daily x5 5 tablet 0   tiZANidine (ZANAFLEX) 2 MG tablet Take 2 mg by mouth every 8 (eight) hours as needed for muscle spasms.      traMADol (ULTRAM) 50 MG tablet Take 50 mg by mouth daily as needed for moderate pain.     No facility-administered medications prior to visit.    ROS   Objective:  Physical Exam   There were no vitals  filed for this visit.    on room air BMI Readings from Last 3 Encounters:  10/11/21 20.79 kg/m  10/15/20 24.05 kg/m  09/14/20 23.54 kg/m   Wt Readings from Last 3 Encounters:  10/11/21 128 lb 12.8 oz (58.4 kg)  10/15/20 139 lb 2 oz (63.1 kg)  09/14/20 136 lb 3.2 oz (61.8 kg)     CBC    Component Value Date/Time   WBC 6.4 07/29/2021 0950   RBC 3.55 (L) 07/29/2021 0950   HGB 10.2 (L) 07/29/2021 0950   HCT 32.8 (L) 07/29/2021 0950   PLT 248 07/29/2021 0950   MCV 92.4 07/29/2021 0950   MCH 28.7 07/29/2021 0950   MCHC 31.1 07/29/2021 0950   RDW 14.4 07/29/2021 0950   LYMPHSABS 0.8 07/29/2021 0950   MONOABS 0.5 07/29/2021 0950   EOSABS 0.9 (H) 07/29/2021 0950   BASOSABS 0.1 07/29/2021 0950    Chest Imaging: Chest x-ray 06/14/2019: No infiltrate. The patient's images have been independently reviewed by me.    CT scan of the chest October 2021: New right upper lobe 4 mm pulmonary nodule.  Evidence of emphysema. The patient's images have been independently reviewed by me.    CT chest October 2022: Lung nodule appears stable. Repeat CT in 1 year The patient's images have been independently reviewed by me.    Pulmonary Functions Testing Results: No flowsheet data found.  FeNO: None   Pathology: None   Echocardiogram:  July 2020: Preserved ejection fraction 55 to 85%, grade 1 diastolic dysfunction, moderate pulmonary hypertension RVSP 55.  Heart Catheterization: None     Assessment & Plan:      ICD-10-CM   1. Chronic respiratory failure with hypoxia (HCC)  J96.11     2. Former smoker  Z87.891     3. Right upper lobe pulmonary nodule  R91.1       Discussion:  This is an 87 year old female, former smoker, quit in the 1980s, probable COPD unable to complete PFTs on previous office attempt.  She likely has COPD I  do think trying to stress her to do PFTs again makes any difference in her management.  We have been following a pulmonary nodule.  Repeat CT scan  this October reveals stability in this nodule  Plan: She has chronic hypoxemic respiratory failure and needs to be walked for her O2 needs. We will attempt to qualify her for O2 today in the office as well as a repeat POC evaluation. She would like to have a portable option if possible. As for her lung nodule she will repeat noncontrasted CT scan of the chest in 1 year to document 2-year stability.  If stable at that point she will need no additional follow-up.  Continue Breztri inhaler Continue albuterol Return to clinic in 1 year after CT scan or as needed.  I spent 32 minutes dedicated to the care of this patient on the date of this encounter to include pre-visit review of records, face-to-face time with the patient discussing conditions above, post visit ordering of testing, clinical documentation with the electronic health record, making appropriate referrals as documented, and communicating necessary findings to members of the patients care team.    Garner Nash, DO [facility] 01/14/2022 6:58 AM

## 2022-01-14 NOTE — Telephone Encounter (Signed)
Received 90 day supply Breztri for pt from [facility]  ATC daughter [name] ok per DPR, left detailed message that they would be placed up front for pick up.  She is going to pick up for the pt- placed up front and she is going to call [facility] and me to ensure samples get mailed to pt's home next refill.

## 2022-01-26 DIAGNOSIS — J9690 Respiratory failure, unspecified, unspecified whether with hypoxia or hypercapnia: Secondary | ICD-10-CM | POA: Diagnosis not present

## 2022-01-26 DIAGNOSIS — J441 Chronic obstructive pulmonary disease with (acute) exacerbation: Secondary | ICD-10-CM | POA: Diagnosis not present

## 2022-02-03 ENCOUNTER — Ambulatory Visit (INDEPENDENT_AMBULATORY_CARE_PROVIDER_SITE_OTHER): Payer: Medicare Other | Admitting: Pulmonary Disease

## 2022-02-03 ENCOUNTER — Other Ambulatory Visit: Payer: Self-pay

## 2022-02-03 ENCOUNTER — Encounter: Payer: Self-pay | Admitting: Pulmonary Disease

## 2022-02-03 VITALS — BP 126/62 | HR 74 | Ht 66.0 in | Wt 126.0 lb

## 2022-02-03 DIAGNOSIS — Z8616 Personal history of COVID-19: Secondary | ICD-10-CM | POA: Diagnosis not present

## 2022-02-03 DIAGNOSIS — J9611 Chronic respiratory failure with hypoxia: Secondary | ICD-10-CM | POA: Diagnosis not present

## 2022-02-03 DIAGNOSIS — Z9981 Dependence on supplemental oxygen: Secondary | ICD-10-CM | POA: Diagnosis not present

## 2022-02-03 DIAGNOSIS — R911 Solitary pulmonary nodule: Secondary | ICD-10-CM | POA: Diagnosis not present

## 2022-02-03 NOTE — Addendum Note (Signed)
Addended by: Valerie Salts on: 02/03/2022 05:21 PM ? ? Modules accepted: Orders ? ?

## 2022-02-03 NOTE — Patient Instructions (Signed)
Thank you for visiting Dr. Valeta Harms at [facility]. ?Today we recommend the following: ? ?Orders Placed This Encounter  ?Procedures  ? CT Chest Wo Contrast  ? ?Walk for POC today  ?CT Chest in October 2023 ? ?Return in about 7 months (around 09/05/2022). ? ? ? ?Please do your part to reduce the spread of COVID-19.  ? ?

## 2022-02-03 NOTE — Progress Notes (Signed)
Synopsis: Referred in September 2020 for hypoxemia by [name], MD  Subjective:   PATIENT ID: Chloe Wilcox GENDER: female DOB: [birth year], MRN: [id number]  Chief Complaint  Patient presents with   Follow-up    This is a 87 year old female past medical history of depression and hypertension.  Recently hospitalized in July 2020.  She is a former smoker.  She had increased shortness of breath and cough with sputum production.  Diagnosed with possible COPD exacerbation.  She was found to be hypoxemic 85% on room air requiring 2 L nasal cannula.  She was treated with IV steroids and bronchodilators.  Patient slowly improved was discharged on June 16, 2019 home with home health.  Patient was still requiring oxygen and recommended outpatient pulmonary follow-up.  COVID testing on admission negative.  Patient had chest x-ray on admission with no infiltrate.  Echocardiogram with preserved ejection fraction, grade 1 diastolic dysfunction, moderate pulmonary hypertension RVSP 55.  OV 08/07/2019: Patient doing well today.  Has been using her oxygen at home as directed.  Wants to know if she can come off of this.  Recently diagnosed with enlarging 9.5 cm pelvic mass.  Seen by urogynecology at [facility].  They are making plans for surgical removal of this.  Looking for pulmonary risk stratification peri-op eval.  She is a former smoker.  Does have daily cough sputum production.  She is doing much better since her hospitalization.  Denies fevers chills night sweats weight loss.  Denies hemoptysis.  She has been using Spiriva HandiHaler plus Flovent.  Occasionally needing to use her albuterol.  She gets winded with short distances.  Has dyspnea on exertion.  OV 09/25/2019: Presents today for follow-up after pulmonary function test.  Patient attempted pulmonary function test today in the office and was unable to have this completed.  She had several attempts but had much difficulty with the coordination of  completing the pulmonary function test.  At this time she has not been using any of the oxygen available or needs at home.  Her O2 sats have been stable.  She is doing well with the Anoro Ellipta inhaler that was given to her.  Otherwise her respiratory symptoms are stable and have not had any change or decline since her last office visit.  She is still working on planning for the pelvic procedure.  For removal of the pelvic mass.  OV 01/01/2020: Patient seen today in the office for follow-up regarding her COPD.  This is a presumed diagnosis.  She was unable to have pulmonary function test that were completed or/attempted at her last office visit.  She has been seen by surgery with concern of a pelvic mass.  She states they have declined on removal of this.  She is no longer on oxygen for the patient.  Stated that she had not needed it any further.  Had several questions today as well as vaccine counseling regarding the Covid vaccine which she is scheduled to receive this Saturday.  OV 10/15/2020: Patient was seen in the office in August 2021 for his COPD exacerbation.  Also had monoclonal antibody infusion on 09/01/2020 after or Covid positive family exposure.  Patient was admitted to the emergency department for CAP exacerbation evaluation in September 03, 2020.  With follow-up in the office on September 14, 2020 and saw Wyn Quaker, NP.  She has a history of COVID-19.  Chronic respiratory failure on oxygen therapy.  OV 10/11/2021: Here today for follow-up for COPD and  chronic hypoxemic respiratory failure.  Patient last seen in the office in November of last year.  Needs to be walk today for O2 qualification.  In addition, the patient is being followed for a pulmonary nodule.  Patient had a CT scan of the chest on 09/01/2021.  This revealed a 4 mm peripheral right upper lobe nodule seen on the previous study which appeared more linear potentially relating to underlying scarring.  Radiologist still recommend a 1 year  CT for documentation of 2-year stability.  We reviewed CT imaging today in the office with patient.  From a respiratory standpoint she is doing okay.  OV 02/03/2022: Here today for follow-up regarding COPD and chronic hypoxemic respiratory failure.  She does need a POC if possible.  She has much difficulty logging around the large O2 tanks.  She has a follow-up CT scan in October this past year shows document stability she did a 1 year repeat CT noncontrasted CT scan.  This has been ordered for October 2023 to be completed.  She is still maintaining well on her Breztri inhaler.  She has not had any exacerbations not requiring hospitalizations.  She has not required any steroids or antibiotics.  Overall from respiratory standpoint stable.   Past Medical History:  Diagnosis Date   COPD (chronic obstructive pulmonary disease) ([place])    Depression    Hypertension      Family History  Family history unknown: Yes     Past Surgical History:  Procedure Laterality Date   VAGINAL HYSTERECTOMY      Social History   Socioeconomic History   Marital status: Married    Spouse name: Not on file   Number of children: Not on file   Years of education: Not on file   Highest education level: Not on file  Occupational History   Not on file  Tobacco Use   Smoking status: Former    Packs/day: 1.00    Years: 33.00    Pack years: 33.00    Types: Cigarettes    Start date: 11/28/1950    Quit date: 11/29/1983    Years since quitting: 38.2   Smokeless tobacco: Never  Vaping Use   Vaping Use: Never used  Substance and Sexual Activity   Alcohol use: No   Drug use: No   Sexual activity: Not on file  Other Topics Concern   Not on file  Social History Narrative   Not on file   Social Determinants of Health   Financial Resource Strain: Not on file  Food Insecurity: Not on file  Transportation Needs: Not on file  Physical Activity: Not on file  Stress: Not on file  Social Connections: Not on file   Intimate Partner Violence: Not on file     No Known Allergies   Outpatient Medications Prior to Visit  Medication Sig Dispense Refill   acetaminophen (TYLENOL) 650 MG CR tablet Take 650 mg by mouth every 8 (eight) hours as needed for pain (or headaches).      albuterol (PROAIR HFA) 108 (90 Base) MCG/ACT inhaler Inhale 2 puffs into the lungs every 6 (six) hours as needed for wheezing or shortness of breath. 1 each 5   albuterol (VENTOLIN HFA) 108 (90 Base) MCG/ACT inhaler Inhale 2 puffs into the lungs every 6 (six) hours as needed for wheezing or shortness of breath. 18 g 2   amLODipine (NORVASC) 10 MG tablet Take 10 mg by mouth daily as needed (IF SYSTOLIC NUMBER IS AT LEAST 100  OR GREATER).      aspirin 325 MG tablet Take 325 mg by mouth daily.     atorvastatin (LIPITOR) 40 MG tablet Take 40 mg by mouth at bedtime.     Budeson-Glycopyrrol-Formoterol (BREZTRI AEROSPHERE) 160-9-4.8 MCG/ACT AERO Inhale 2 puffs into the lungs in the morning and at bedtime. 10.7 g 5   citalopram (CELEXA) 20 MG tablet Take 20 mg by mouth daily.     diclofenac (VOLTAREN) 50 MG EC tablet Take 50 mg by mouth 2 (two) times daily.     diclofenac Sodium (VOLTAREN) 1 % GEL Apply 2 g topically 4 (four) times daily as needed. 50 g 0   furosemide (LASIX) 20 MG tablet Take 20-40 mg by mouth See admin instructions. Take 40 mg by mouth in the morning and 20 mg at bedtime     ipratropium-albuterol (DUONEB) 0.5-2.5 (3) MG/3ML SOLN Take 3 mLs by nebulization 2 (two) times daily. 360 mL 6   Multiple Vitamin (MULTIVITAMIN) tablet Take 1 tablet by mouth daily.     olmesartan (BENICAR) 20 MG tablet Take 10 mg by mouth at bedtime.     tiZANidine (ZANAFLEX) 2 MG tablet Take 2 mg by mouth every 8 (eight) hours as needed for muscle spasms.      traMADol (ULTRAM) 50 MG tablet Take 50 mg by mouth daily as needed for moderate pain.     Budeson-Glycopyrrol-Formoterol (BREZTRI AEROSPHERE) 160-9-4.8 MCG/ACT AERO Inhale 2 puffs into the lungs  in the morning and at bedtime. 32.1 g 3   Budeson-Glycopyrrol-Formoterol (BREZTRI AEROSPHERE) 160-9-4.8 MCG/ACT AERO Inhale 2 puffs into the lungs in the morning and at bedtime. 11.8 g 0   predniSONE (DELTASONE) 10 MG tablet Take 4 tablets once daily for 5 days. 20 tablet 0   predniSONE (DELTASONE) 50 MG tablet 1 p.o. daily x5 5 tablet 0   No facility-administered medications prior to visit.    Review of Systems  Constitutional:  Negative for chills, fever, malaise/fatigue and weight loss.  HENT:  Negative for hearing loss, sore throat and tinnitus.   Eyes:  Negative for blurred vision and double vision.  Respiratory:  Positive for cough and shortness of breath. Negative for hemoptysis, sputum production, wheezing and stridor.   Cardiovascular:  Negative for chest pain, palpitations, orthopnea, leg swelling and PND.  Gastrointestinal:  Negative for abdominal pain, constipation, diarrhea, heartburn, nausea and vomiting.  Genitourinary:  Negative for dysuria, hematuria and urgency.  Musculoskeletal:  Negative for joint pain and myalgias.  Skin:  Negative for itching and rash.  Neurological:  Negative for dizziness, tingling, weakness and headaches.  Endo/Heme/Allergies:  Negative for environmental allergies. Does not bruise/bleed easily.  Psychiatric/Behavioral:  Negative for depression. The patient is not nervous/anxious and does not have insomnia.   All other systems reviewed and are negative.   Objective:  Physical Exam Vitals reviewed.  Constitutional:      General: She is not in acute distress.    Appearance: She is well-developed.  HENT:     Head: Normocephalic and atraumatic.     Mouth/Throat:     Pharynx: No oropharyngeal exudate.  Eyes:     Conjunctiva/sclera: Conjunctivae normal.     Pupils: Pupils are equal, round, and reactive to light.  Neck:     Vascular: No JVD.     Trachea: No tracheal deviation.     Comments: Loss of supraclavicular fat Cardiovascular:      Rate and Rhythm: Normal rate and regular rhythm.     Heart sounds:  S1 normal and S2 normal.     Comments: Distant heart tones Pulmonary:     Effort: No tachypnea or accessory muscle usage.     Breath sounds: No stridor. Decreased breath sounds (throughout all lung fields) present. No wheezing, rhonchi or rales.     Comments: Severely diminished breath sounds bilaterally Abdominal:     General: Bowel sounds are normal. There is no distension.     Palpations: Abdomen is soft.     Tenderness: There is no abdominal tenderness.  Musculoskeletal:        General: Deformity (muscle wasting ) present.  Skin:    General: Skin is warm and dry.     Capillary Refill: Capillary refill takes less than 2 seconds.     Findings: No rash.  Neurological:     Mental Status: She is alert and oriented to person, place, and time.  Psychiatric:        Behavior: Behavior normal.     Vitals:   02/03/22 1601  BP: 126/62  Pulse: 74  SpO2: 100%  Weight: 126 lb (57.2 kg)  Height: '5\' 6"'$  (1.676 m)   100% on room air BMI Readings from Last 3 Encounters:  02/03/22 20.34 kg/m  10/11/21 20.79 kg/m  10/15/20 24.05 kg/m   Wt Readings from Last 3 Encounters:  02/03/22 126 lb (57.2 kg)  10/11/21 128 lb 12.8 oz (58.4 kg)  10/15/20 139 lb 2 oz (63.1 kg)     CBC    Component Value Date/Time   WBC 6.4 07/29/2021 0950   RBC 3.55 (L) 07/29/2021 0950   HGB 10.2 (L) 07/29/2021 0950   HCT 32.8 (L) 07/29/2021 0950   PLT 248 07/29/2021 0950   MCV 92.4 07/29/2021 0950   MCH 28.7 07/29/2021 0950   MCHC 31.1 07/29/2021 0950   RDW 14.4 07/29/2021 0950   LYMPHSABS 0.8 07/29/2021 0950   MONOABS 0.5 07/29/2021 0950   EOSABS 0.9 (H) 07/29/2021 0950   BASOSABS 0.1 07/29/2021 0950    Chest Imaging: Chest x-ray 06/14/2019: No infiltrate. The patient's images have been independently reviewed by me.    CT scan of the chest October 2021: New right upper lobe 4 mm pulmonary nodule.  Evidence of emphysema. The  patient's images have been independently reviewed by me.    CT chest October 2022: Lung nodule appears stable. Repeat CT in 1 year The patient's images have been independently reviewed by me.    Pulmonary Functions Testing Results: No flowsheet data found.  FeNO: None   Pathology: None   Echocardiogram:  July 2020: Preserved ejection fraction 55 to 69%, grade 1 diastolic dysfunction, moderate pulmonary hypertension RVSP 55.  Heart Catheterization: None     Assessment & Plan:      ICD-10-CM   1. Lung nodule  R91.1 CT Chest Wo Contrast    2. Chronic respiratory failure with hypoxia (HCC)  J96.11     3. Oxygen dependent  Z99.81     4. History of COVID-19  Z86.16       Discussion:  This is a 87 year old female, former smoker quit in the 1980s, probable COPD attempted PFTs in the past was unable to complete them.  I do not think it changes a whole lot in her management.  She is maintained well on triple therapy inhaler regimen.  Chronic hypoxemic respiratory failure on 2.5 L.  She would like to qualify for POC if possible  Plan: Walk today in the office for POC for qualification. Continue DME  supply orders for O2 as needed. Goal maintain sats above 88%. Continue Breztri inhaler Continue albuterol as needed Follow-up in clinic after her repeat CT scan of the chest in October 2023 for 1 year follow-up on her lung nodule.  Appreciate CMA assistance walking patient today in office for [place] qualification.  I spent 32 minutes dedicated to the care of this patient on the date of this encounter to include pre-visit review of records, face-to-face time with the patient discussing conditions above, post visit ordering of testing, clinical documentation with the electronic health record, making appropriate referrals as documented, and communicating necessary findings to members of the patients care team.    Garner Nash, DO [facility] 02/03/2022 4:42 PM

## 2022-02-08 DIAGNOSIS — J449 Chronic obstructive pulmonary disease, unspecified: Secondary | ICD-10-CM | POA: Diagnosis not present

## 2022-02-26 DIAGNOSIS — J9690 Respiratory failure, unspecified, unspecified whether with hypoxia or hypercapnia: Secondary | ICD-10-CM | POA: Diagnosis not present

## 2022-02-26 DIAGNOSIS — J441 Chronic obstructive pulmonary disease with (acute) exacerbation: Secondary | ICD-10-CM | POA: Diagnosis not present

## 2022-03-11 DIAGNOSIS — J449 Chronic obstructive pulmonary disease, unspecified: Secondary | ICD-10-CM | POA: Diagnosis not present

## 2022-03-28 ENCOUNTER — Other Ambulatory Visit: Payer: Self-pay

## 2022-03-28 DIAGNOSIS — J441 Chronic obstructive pulmonary disease with (acute) exacerbation: Secondary | ICD-10-CM | POA: Diagnosis not present

## 2022-03-28 DIAGNOSIS — J9690 Respiratory failure, unspecified, unspecified whether with hypoxia or hypercapnia: Secondary | ICD-10-CM | POA: Diagnosis not present

## 2022-04-10 DIAGNOSIS — J449 Chronic obstructive pulmonary disease, unspecified: Secondary | ICD-10-CM | POA: Diagnosis not present

## 2022-04-28 DIAGNOSIS — J441 Chronic obstructive pulmonary disease with (acute) exacerbation: Secondary | ICD-10-CM | POA: Diagnosis not present

## 2022-04-28 DIAGNOSIS — J9690 Respiratory failure, unspecified, unspecified whether with hypoxia or hypercapnia: Secondary | ICD-10-CM | POA: Diagnosis not present

## 2022-05-06 ENCOUNTER — Ambulatory Visit: Payer: Medicare Other | Admitting: Podiatry

## 2022-05-11 DIAGNOSIS — J449 Chronic obstructive pulmonary disease, unspecified: Secondary | ICD-10-CM | POA: Diagnosis not present

## 2022-06-03 ENCOUNTER — Ambulatory Visit: Payer: Medicare Other | Admitting: Podiatry

## 2022-06-03 ENCOUNTER — Encounter: Payer: Self-pay | Admitting: Podiatry

## 2022-06-03 DIAGNOSIS — B351 Tinea unguium: Secondary | ICD-10-CM

## 2022-06-03 DIAGNOSIS — M79674 Pain in right toe(s): Secondary | ICD-10-CM

## 2022-06-03 DIAGNOSIS — M79675 Pain in left toe(s): Secondary | ICD-10-CM

## 2022-06-04 NOTE — Progress Notes (Signed)
Subjective:   Patient ID: Chloe Wilcox, female   DOB: 87 y.o.   MRN: [id number]   HPI Patient presents with elongated nailbeds 1-5 both feet that she cannot take care of and she is with caregiver who helps her but is not able to help her with this.  She does not smoke likes to be active   Review of Systems  All other systems reviewed and are negative.       Objective:  Physical Exam Vitals and nursing note reviewed.  Constitutional:      Appearance: She is well-developed.  Pulmonary:     Effort: Pulmonary effort is normal.  Musculoskeletal:        General: Normal range of motion.  Skin:    General: Skin is warm.  Neurological:     Mental Status: She is alert.     Neurovascular status found to be moderately diminished but intact with the patient noted to have diminished range of motion and muscle strength.  Patient is found to have thickened nailbeds 1-5 both feet that are dystrophic painful and impossible for them to cut due to the deformity present     Assessment:  Chronic mycotic nail infection with pain 1-5 both feet     Plan:  Debridement painful nailbeds 1-5 both feet no iatrogenic bleeding noted and discussed nail care to be done for the future with H&P done today

## 2022-06-10 DIAGNOSIS — J449 Chronic obstructive pulmonary disease, unspecified: Secondary | ICD-10-CM | POA: Diagnosis not present

## 2022-06-13 ENCOUNTER — Telehealth: Payer: Self-pay | Admitting: Pulmonary Disease

## 2022-06-13 MED ORDER — NIRMATRELVIR/RITONAVIR (PAXLOVID)TABLET: 3.0000 | ORAL_TABLET | Freq: Two times a day (BID) | ORAL | 0 refills | Status: AC

## 2022-06-13 NOTE — Telephone Encounter (Signed)
Called and spoke with patient's granddaughter Chloe Wilcox. She stated that the patient tested positive for COVID yesterday morning. So far her symptoms have been a dry cough and bilateral ear itching. Symptoms started on Saturday. Everyone in their household has tested positive.  Per Chloe Wilcox, the last time she had COVID, she ended up in the hospital.   Chloe Wilcox wants to know if Dr. Valeta Harms would prescribe either one of the antiviral medications or recommend the infusion for her.   Pharmacy is [facility] on [address].   Dr. Valeta Harms, can you please advise? Thanks!

## 2022-06-13 NOTE — Telephone Encounter (Signed)
Patient's granddaughter, Janett Billow, called and states that the whole house has COVID, tested positive yesterday. She would like to know if she needs an infusion or if there is a pill she can take. She is only saying her ear hurts and small cough. Uses [facility] on [address].  Please advise, call Janett Billow back at [phone].

## 2022-06-13 NOTE — Telephone Encounter (Signed)
Icard, Octavio Graves, DO  Potts, Cherina M, CMA; [facility] Triage Pool 9 minutes ago (10:38 AM)     Please send prescription in for paxlovid  Counsel patient on any worsening of symptoms she should come to ED.   BLI   Garner Nash, DO  [facility]  06/13/2022 10:37 AM     Called and spoke with pt's granddaughter Janett Billow letting her know recs per Dr. Valeta Harms and she verbalized understanding. Rx for paxlovid has been sent to preferred pharmacy. Nothing further needed.

## 2022-07-07 DIAGNOSIS — I10 Essential (primary) hypertension: Secondary | ICD-10-CM | POA: Diagnosis not present

## 2022-07-07 DIAGNOSIS — R600 Localized edema: Secondary | ICD-10-CM | POA: Diagnosis not present

## 2022-07-07 DIAGNOSIS — J449 Chronic obstructive pulmonary disease, unspecified: Secondary | ICD-10-CM | POA: Diagnosis not present

## 2022-07-07 DIAGNOSIS — E7800 Pure hypercholesterolemia, unspecified: Secondary | ICD-10-CM | POA: Diagnosis not present

## 2022-07-07 DIAGNOSIS — M159 Polyosteoarthritis, unspecified: Secondary | ICD-10-CM | POA: Diagnosis not present

## 2022-07-11 DIAGNOSIS — J449 Chronic obstructive pulmonary disease, unspecified: Secondary | ICD-10-CM | POA: Diagnosis not present

## 2022-08-11 DIAGNOSIS — J449 Chronic obstructive pulmonary disease, unspecified: Secondary | ICD-10-CM | POA: Diagnosis not present

## 2022-08-30 ENCOUNTER — Telehealth: Payer: Self-pay | Admitting: Pulmonary Disease

## 2022-08-30 NOTE — Telephone Encounter (Signed)
Called grand daughter and she states that her grandmother appears to have thrust. She stopped rinsing her mouth out after her inhaler about a month ago.   Please advise sir

## 2022-08-31 MED ORDER — FLUCONAZOLE 100 MG PO TABS: 100.0000 mg | ORAL_TABLET | Freq: Every day | ORAL | 0 refills | Status: AC

## 2022-08-31 MED ORDER — NYSTATIN 100000 UNIT/ML MT SUSP: 5.0000 mL | Freq: Four times a day (QID) | OROMUCOSAL | 0 refills | Status: DC

## 2022-08-31 NOTE — Telephone Encounter (Signed)
Called and spoke with pt's granddaughter Janett Billow letting her know recs per BI and she verbalized understanding. Nothing further needed.

## 2022-08-31 NOTE — Telephone Encounter (Signed)
PCCM:  Meds ordered this encounter  Medications   fluconazole (DIFLUCAN) 100 MG tablet    Sig: Take 1 tablet (100 mg total) by mouth daily for 3 days.    Dispense:  3 tablet    Refill:  0   nystatin (MYCOSTATIN) 100000 UNIT/ML suspension    Sig: Take 5 mLs (500,000 Units total) by mouth 4 (four) times daily.    Dispense:  60 mL    Refill:  0   Orders placed.  Garner Nash, DO [facility] 08/31/2022 7:40 AM

## 2022-09-02 ENCOUNTER — Ambulatory Visit (HOSPITAL_COMMUNITY): Payer: Medicare Other

## 2022-09-02 ENCOUNTER — Telehealth: Payer: Self-pay | Admitting: *Deleted

## 2022-09-02 NOTE — Telephone Encounter (Signed)
ATC Chloe Wilcox (DPR), granddaughter regarding her Chloe Wilcox inhalers that were mailed to our office from [facility].  Advised that she can pick them up M-F 8 am -5 pm.  Advised to return call with any questions.  Box placed up with the samples at front desk.

## 2022-09-06 DIAGNOSIS — K140 Glossitis: Secondary | ICD-10-CM | POA: Diagnosis not present

## 2022-09-06 DIAGNOSIS — D509 Iron deficiency anemia, unspecified: Secondary | ICD-10-CM | POA: Diagnosis not present

## 2022-09-06 DIAGNOSIS — R600 Localized edema: Secondary | ICD-10-CM | POA: Diagnosis not present

## 2022-09-06 DIAGNOSIS — J449 Chronic obstructive pulmonary disease, unspecified: Secondary | ICD-10-CM | POA: Diagnosis not present

## 2022-09-06 DIAGNOSIS — I10 Essential (primary) hypertension: Secondary | ICD-10-CM | POA: Diagnosis not present

## 2022-09-09 ENCOUNTER — Ambulatory Visit (INDEPENDENT_AMBULATORY_CARE_PROVIDER_SITE_OTHER): Payer: Medicare Other | Admitting: Podiatry

## 2022-09-09 ENCOUNTER — Other Ambulatory Visit: Payer: Medicare Other

## 2022-09-09 DIAGNOSIS — Z91199 Patient's noncompliance with other medical treatment and regimen due to unspecified reason: Secondary | ICD-10-CM

## 2022-09-10 DIAGNOSIS — J449 Chronic obstructive pulmonary disease, unspecified: Secondary | ICD-10-CM | POA: Diagnosis not present

## 2022-09-11 NOTE — Progress Notes (Signed)
1. No-show for appointment     

## 2022-09-12 ENCOUNTER — Ambulatory Visit: Payer: Medicare Other | Admitting: Pulmonary Disease

## 2022-09-23 ENCOUNTER — Other Ambulatory Visit: Payer: Medicare Other

## 2022-10-11 DIAGNOSIS — J449 Chronic obstructive pulmonary disease, unspecified: Secondary | ICD-10-CM | POA: Diagnosis not present

## 2022-11-10 DIAGNOSIS — J449 Chronic obstructive pulmonary disease, unspecified: Secondary | ICD-10-CM | POA: Diagnosis not present

## 2022-12-11 DIAGNOSIS — J449 Chronic obstructive pulmonary disease, unspecified: Secondary | ICD-10-CM | POA: Diagnosis not present

## 2023-01-11 DIAGNOSIS — J449 Chronic obstructive pulmonary disease, unspecified: Secondary | ICD-10-CM | POA: Diagnosis not present

## 2023-02-09 DIAGNOSIS — J449 Chronic obstructive pulmonary disease, unspecified: Secondary | ICD-10-CM | POA: Diagnosis not present

## 2023-02-15 ENCOUNTER — Telehealth: Payer: Self-pay | Admitting: Pulmonary Disease

## 2023-02-15 NOTE — Telephone Encounter (Signed)
Pt calling in bc she has been exposed to the flu A. She doesn't have it but her granddaughter wants to be on the safe side and wants Tamaflu sent in

## 2023-02-15 NOTE — Telephone Encounter (Signed)
Spoke to pt's granddaughter and pt's great granddaughter has the flu. Pt's granddaughter wanted tamiflu called in, pt not showing symptoms so advised to call PCP for tamiflu. Pt's granddaughter verbalized understanding nothing further needed.

## 2023-02-20 ENCOUNTER — Telehealth: Payer: Self-pay | Admitting: *Deleted

## 2023-02-20 NOTE — Telephone Encounter (Signed)
Called and spoke with patient's granddaughter, Janett Billow ([place]), advised her that her Grandmother's Breztri inhaler was mailed to our office and I wanted to know if she wanted to come pick it up.  She stated she would come shortly to pick it up.

## 2023-02-28 ENCOUNTER — Encounter: Payer: Self-pay | Admitting: Adult Health

## 2023-02-28 ENCOUNTER — Ambulatory Visit (INDEPENDENT_AMBULATORY_CARE_PROVIDER_SITE_OTHER): Payer: Medicare Other

## 2023-02-28 ENCOUNTER — Ambulatory Visit: Payer: Medicare Other | Admitting: Adult Health

## 2023-02-28 VITALS — BP 124/50 | HR 95 | Temp 98.0°F | Wt 128.6 lb

## 2023-02-28 DIAGNOSIS — J441 Chronic obstructive pulmonary disease with (acute) exacerbation: Secondary | ICD-10-CM

## 2023-02-28 DIAGNOSIS — J9611 Chronic respiratory failure with hypoxia: Secondary | ICD-10-CM | POA: Diagnosis not present

## 2023-02-28 MED ORDER — ALBUTEROL SULFATE HFA 108 (90 BASE) MCG/ACT IN AERS: 2.0000 | INHALATION_SPRAY | Freq: Four times a day (QID) | RESPIRATORY_TRACT | 2 refills | Status: DC | PRN

## 2023-02-28 MED ORDER — AZITHROMYCIN 250 MG PO TABS: ORAL_TABLET | ORAL | 0 refills | Status: AC

## 2023-02-28 MED ORDER — IPRATROPIUM-ALBUTEROL 0.5-2.5 (3) MG/3ML IN SOLN: 3.0000 mL | Freq: Two times a day (BID) | RESPIRATORY_TRACT | 6 refills | Status: DC

## 2023-02-28 MED ORDER — PREDNISONE 20 MG PO TABS: 20.0000 mg | ORAL_TABLET | Freq: Every day | ORAL | 0 refills | Status: DC

## 2023-02-28 NOTE — Assessment & Plan Note (Signed)
Acute COPD exacerbation.  Will treat with empiric antibiotics and steroids.  Chest x-ray showed bronchitic changes.  Plan  Patient Instructions  Chest xray today.  Prednisone 20mg  daily for 5 days.  Zpack take as directed.  Continue on Breztri 2 puffs Twice daily  , rinse after use.  Albuterol inhaler or Duoneb As needed   Continue on Oxygen 3l/m. Goal is to keep O2 sats >88-90%.  Follow up with Dr. Valeta Harms or [name] Checketts NP in 3-4 weeks and As needed   Please contact office for sooner follow up if symptoms do not improve or worsen or seek emergency care

## 2023-02-28 NOTE — Progress Notes (Signed)
@Patient  ID: Chloe Wilcox, female    DOB: 05/07/1935, 88 y.o.   MRN: [id number]  Chief Complaint  Patient presents with   Acute Visit    Referring provider: [name], MD  HPI: 88 year old female former smoker followed for presumed COPD (unable to complete PFTs) and chronic respiratory failure  TEST/EVENTS :   02/28/2023 Acute OV : COPD  Patient presents for 3-day history of increased cough, congestion nasal stuffiness and shortness of breath.  Patient is had increased her albuterol use.  Also left her oxygen off a few nights ago and feels that it caused her breathing to be worse.  Went from 2.5 L to 3 L.  Patient denies any hemoptysis, chest pain, orthopnea .  Does get lower extremity swelling.  Is on Lasix 60 mg daily. She has been taken some over-the-counter cough medicines.  Appetite is good with no nausea vomiting diarrhea.  She is accompanied by her granddaughter today.  Patient is very hard of hearing. She remains on Breztri inhaler twice daily.  Patient took a DuoNeb this morning which seemed to help her quite a bit.   No Known Allergies  Immunization History  Administered Date(s) Administered   PFIZER(Purple Top)SARS-COV-2 Vaccination 01/04/2020, 01/27/2020    Past Medical History:  Diagnosis Date   COPD (chronic obstructive pulmonary disease)    Depression    Hypertension     Tobacco History: Social History   Tobacco Use  Smoking Status Former   Packs/day: 1.00   Years: 33.00   Additional pack years: 0.00   Total pack years: 33.00   Types: Cigarettes   Start date: 11/28/1950   Quit date: 11/29/1983   Years since quitting: 39.2  Smokeless Tobacco Never   Counseling given: Not Answered   Outpatient Medications Prior to Visit  Medication Sig Dispense Refill   acetaminophen (TYLENOL) 650 MG CR tablet Take 650 mg by mouth every 8 (eight) hours as needed for pain (or headaches).      amLODipine (NORVASC) 10 MG tablet Take 10 mg by mouth daily as needed  (IF SYSTOLIC NUMBER IS AT LEAST 100 OR GREATER).      aspirin 325 MG tablet Take 325 mg by mouth daily.     atorvastatin (LIPITOR) 40 MG tablet Take 40 mg by mouth at bedtime.     Budeson-Glycopyrrol-Formoterol (BREZTRI AEROSPHERE) 160-9-4.8 MCG/ACT AERO Inhale 2 puffs into the lungs in the morning and at bedtime. 10.7 g 5   citalopram (CELEXA) 20 MG tablet Take 20 mg by mouth daily.     diclofenac (VOLTAREN) 50 MG EC tablet Take 50 mg by mouth 2 (two) times daily.     furosemide (LASIX) 20 MG tablet Take 20-40 mg by mouth See admin instructions. Take 40 mg by mouth in the morning and 20 mg at bedtime     olmesartan (BENICAR) 20 MG tablet Take 10 mg by mouth at bedtime.     tiZANidine (ZANAFLEX) 2 MG tablet Take 2 mg by mouth every 8 (eight) hours as needed for muscle spasms.      traMADol (ULTRAM) 50 MG tablet Take 50 mg by mouth daily as needed for moderate pain.     albuterol (VENTOLIN HFA) 108 (90 Base) MCG/ACT inhaler Inhale 2 puffs into the lungs every 6 (six) hours as needed for wheezing or shortness of breath. 18 g 2   ipratropium-albuterol (DUONEB) 0.5-2.5 (3) MG/3ML SOLN Take 3 mLs by nebulization 2 (two) times daily. 360 mL 6   albuterol (  PROAIR HFA) 108 (90 Base) MCG/ACT inhaler Inhale 2 puffs into the lungs every 6 (six) hours as needed for wheezing or shortness of breath. 1 each 5   diclofenac Sodium (VOLTAREN) 1 % GEL Apply 2 g topically 4 (four) times daily as needed. 50 g 0   Multiple Vitamin (MULTIVITAMIN) tablet Take 1 tablet by mouth daily. (Patient not taking: Reported on 02/28/2023)     nystatin (MYCOSTATIN) 100000 UNIT/ML suspension Take 5 mLs (500,000 Units total) by mouth 4 (four) times daily. (Patient not taking: Reported on 02/28/2023) 60 mL 0   No facility-administered medications prior to visit.     Review of Systems:   Constitutional:   No  weight loss, night sweats,  Fevers, chills, + fatigue, or  lassitude.  HEENT:   No headaches,  Difficulty swallowing,   Tooth/dental problems, or  Sore throat,                No sneezing, itching, ear ache, nasal congestion, post nasal drip,   CV:  No chest pain,  Orthopnea, PND, swelling in lower extremities, anasarca, dizziness, palpitations, syncope.   GI  No heartburn, indigestion, abdominal pain, nausea, vomiting, diarrhea, change in bowel habits, loss of appetite, bloody stools.   Resp:  No chest wall deformity  Skin: no rash or lesions.  GU: no dysuria, change in color of urine, no urgency or frequency.  No flank pain, no hematuria   MS:  No joint pain or swelling.  No decreased range of motion.  No back pain.    Physical Exam  BP (!) 124/50 (BP Location: Right Arm, Patient Position: Sitting, Cuff Size: Normal)   Pulse 95   Temp 98 F (36.7 C) (Oral)   Wt 128 lb 9.6 oz (58.3 kg)   SpO2 99% Comment: 3L continuous  BMI 20.76 kg/m   GEN: A/Ox3; pleasant , NAD, elderly in wheelchair   HEENT:  [place]/AT, , NOSE-clear, THROAT-clear, no lesions, no postnasal drip or exudate noted.   NECK:  Supple w/ fair ROM; no JVD; normal carotid impulses w/o bruits; no thyromegaly or nodules palpated; no lymphadenopathy.    RESP few trace rhonchi  no accessory muscle use, no dullness to percussion  CARD:  RRR, no m/r/g, 1+ peripheral edema, pulses intact, no cyanosis or clubbing.  GI:   Soft & nt; nml bowel sounds; no organomegaly or masses detected.   Musco: Warm bil, no deformities or joint swelling noted.   Neuro: alert, no focal deficits noted.    Skin: Warm, no lesions or rashes    Lab Results:   BMET   BNP   ProBNP No results found for: "PROBNP"  Imaging: DG Chest 2 View  Result Date: 02/28/2023 CLINICAL DATA:  COPD exacerbation EXAM: CHEST - 2 VIEW COMPARISON:  Radiograph 07/29/2021 FINDINGS: Unchanged cardiomediastinal silhouette. There is perihilar bronchial wall thickening. No airspace consolidation. There is no large pleural effusion or evidence of pneumothorax. Bilateral  shoulder degenerative changes. Thoracic spondylosis. No acute osseous abnormality. IMPRESSION: Findings suggestive of bronchitis.  No focal airspace consolidation. Electronically Signed   By: Maurine Simmering M.D.   On: 02/28/2023 17:18          No data to display          No results found for: "NITRICOXIDE"      Assessment & Plan:   COPD exacerbation ([place]) Acute COPD exacerbation.  Will treat with empiric antibiotics and steroids.  Chest x-ray showed bronchitic changes.  Plan  Patient Instructions  Chest xray today.  Prednisone 20mg  daily for 5 days.  Zpack take as directed.  Continue on Breztri 2 puffs Twice daily  , rinse after use.  Albuterol inhaler or Duoneb As needed   Continue on Oxygen 3l/m. Goal is to keep O2 sats >88-90%.  Follow up with Dr. Valeta Harms or Trystin Terhune NP in 3-4 weeks and As needed   Please contact office for sooner follow up if symptoms do not improve or worsen or seek emergency care     Respiratory failure ([place]) Chronic respiratory failure.  Continue on oxygen at 3 L.  O2 sat goals are greater than 88 to 90%     Rexene Edison, NP 02/28/2023

## 2023-02-28 NOTE — Assessment & Plan Note (Addendum)
Chronic respiratory failure.  Continue on oxygen at 3 L.  O2 sat goals are greater than 88 to 90%

## 2023-02-28 NOTE — Patient Instructions (Addendum)
Chest xray today.  Prednisone 20mg  daily for 5 days.  Zpack take as directed.  Continue on Breztri 2 puffs Twice daily  , rinse after use.  Albuterol inhaler or Duoneb As needed   Continue on Oxygen 3l/m. Goal is to keep O2 sats >88-90%.  Follow up with Dr. Valeta Harms or Kadden Osterhout NP in 3-4 weeks and As needed   Please contact office for sooner follow up if symptoms do not improve or worsen or seek emergency care

## 2023-03-01 NOTE — Progress Notes (Signed)
Called and spoke with Janett Billow (granddaughter, on [place]), advised of results/recommendations per Rexene Edison NP.  She verbalized understanding.  She stated that her grandmother has difficulty with the albuterol inhaler, sometimes she gets the medication in and sometimes she does not.  She feels like she needs something for sob between the 2 doses of duoneb.  She was asking if albuterol neb solution could be sent in to use between the doses of duoneb.  Tammy, please advise.  Thank you.

## 2023-03-12 DIAGNOSIS — J449 Chronic obstructive pulmonary disease, unspecified: Secondary | ICD-10-CM | POA: Diagnosis not present

## 2023-03-21 ENCOUNTER — Telehealth: Payer: Self-pay | Admitting: *Deleted

## 2023-03-21 NOTE — Telephone Encounter (Signed)
Called and spoke with patient's granddaughter, [name] [facility]), asked if she could come in earlier on Thursday, 03/23/23.  She was agreeable to doing so, they will come at 3 pm.  Nothing further needed.

## 2023-03-21 NOTE — Progress Notes (Signed)
ATC patient's granddaughter, left vm regarding recommendations per Tammy.  She has an appointment on Thursday 4/25.

## 2023-03-23 ENCOUNTER — Ambulatory Visit: Payer: Medicare Other | Admitting: Adult Health

## 2023-03-27 ENCOUNTER — Ambulatory Visit: Payer: Medicare Other | Admitting: Adult Health

## 2023-04-10 ENCOUNTER — Ambulatory Visit: Payer: Medicare Other | Admitting: Adult Health

## 2023-04-11 DIAGNOSIS — J449 Chronic obstructive pulmonary disease, unspecified: Secondary | ICD-10-CM | POA: Diagnosis not present

## 2023-04-20 ENCOUNTER — Encounter: Payer: Self-pay | Admitting: Adult Health

## 2023-04-27 ENCOUNTER — Telehealth: Payer: Self-pay | Admitting: Adult Health

## 2023-04-27 MED ORDER — PREDNISONE 20 MG PO TABS: 20.0000 mg | ORAL_TABLET | Freq: Every day | ORAL | 0 refills | Status: DC

## 2023-04-27 NOTE — Telephone Encounter (Signed)
Seen last in April for COPD. Sounds like she might be having a COPD flare. Can you call in prednisone 20 mg daily for 5 days.  #5 with no refills.  If symptoms are improving will need office visit for further evaluation.  If symptoms get worse she will need to go to urgent care or emergency room for further evaluation  Please contact office for sooner follow up if symptoms do not improve or worsen or seek emergency care

## 2023-04-27 NOTE — Telephone Encounter (Signed)
Patient's granddaughter called to request patient get some Prednisone sent in for her flareup.  She stated the patient is SOB and having a runny nose.  She also said that Tammy has sent in Meds before when the patient has a flare up.  Please advise and if there are any questions, please call [name] at new #[phone]

## 2023-04-27 NOTE — Telephone Encounter (Signed)
ATC Chloe Wilcox. LVM for her to return my call.

## 2023-04-27 NOTE — Telephone Encounter (Signed)
I spoke with [name]. Chloe Wilcox is having increased SOB for 1 week. No cough or wheezing. Some drainage. No F/C/S 3L O2 constant and her O2 is 98%  Has not tested for Covid but she said he grandmother does not go anywhere to get Covid.  Using- Albuterol 2.5 in Neb- every 5 hours (granddaughters prescription) DuoNeb- BID Breztri- 2 puffs BID  Not using her Albuterol inhaler. Not on Prednisone currently.

## 2023-04-27 NOTE — Telephone Encounter (Signed)
I have notified [name] and sent in the prescription.  Nothing further needed.

## 2023-04-27 NOTE — Telephone Encounter (Signed)
I tried to contact [name]. The phone just rang and then said your call could not be completed as dialed. I will try again later.

## 2023-05-01 MED ORDER — ALBUTEROL SULFATE (2.5 MG/3ML) 0.083% IN NEBU: 2.5000 mg | INHALATION_SOLUTION | Freq: Four times a day (QID) | RESPIRATORY_TRACT | 5 refills | Status: DC | PRN

## 2023-05-01 NOTE — Telephone Encounter (Signed)
That is fine but needs to stop Duoneb bc it has albuterol and ipratropium   Albuterol neb every 6hr as needed

## 2023-05-01 NOTE — Telephone Encounter (Signed)
Chloe Wilcox, pt is asking for plain albuterol neb solution  Please advise if we can send, thanks!

## 2023-05-12 DIAGNOSIS — J449 Chronic obstructive pulmonary disease, unspecified: Secondary | ICD-10-CM | POA: Diagnosis not present

## 2023-05-30 ENCOUNTER — Encounter: Payer: Self-pay | Admitting: Adult Health

## 2023-05-30 ENCOUNTER — Ambulatory Visit (INDEPENDENT_AMBULATORY_CARE_PROVIDER_SITE_OTHER): Payer: Medicare Other | Admitting: Adult Health

## 2023-05-30 VITALS — BP 100/48 | HR 69 | Temp 98.3°F | Ht 66.0 in | Wt 121.2 lb

## 2023-05-30 DIAGNOSIS — J449 Chronic obstructive pulmonary disease, unspecified: Secondary | ICD-10-CM

## 2023-05-30 DIAGNOSIS — J9611 Chronic respiratory failure with hypoxia: Secondary | ICD-10-CM

## 2023-05-30 MED ORDER — BREZTRI AEROSPHERE 160-9-4.8 MCG/ACT IN AERO: 2.0000 | INHALATION_SPRAY | Freq: Two times a day (BID) | RESPIRATORY_TRACT | 5 refills | Status: DC

## 2023-05-30 MED ORDER — ALBUTEROL SULFATE HFA 108 (90 BASE) MCG/ACT IN AERS: 2.0000 | INHALATION_SPRAY | Freq: Four times a day (QID) | RESPIRATORY_TRACT | 2 refills | Status: DC | PRN

## 2023-05-30 NOTE — Assessment & Plan Note (Signed)
Chronic respiratory failure-continue on oxygen to maintain O2 saturations greater than 88 to 90%. 

## 2023-05-30 NOTE — Progress Notes (Signed)
@Patient  ID: Chloe Wilcox, female    DOB: 1935/01/09, 88 y.o.   MRN: [id number]  Chief Complaint  Patient presents with   Follow-up    Referring provider: Baldemar Lenis, MD  HPI: 88 year old female former smoker followed for presumed COPD (unable to complete PFTs) and chronic respiratory failure on oxygen  TEST/EVENTS :  CT chest September 01, 2021 decreased 3 mm right upper lobe nodule looks like an area of scarring.  Chest x-ray February 28, 2023 bronchitic changes no acute process noted  PFTs unable to complete  05/30/2023 Follow up : COPD, O2 RF  Patient presents for a 75-month follow-up visit.  Patient was seen last visit with a COPD exacerbation.  She was treated with a Z-Pak and a short prednisone burst.  Patient says that she is feeling better.  Definitely feels better right after taking prednisone for couple weeks and then symptoms seem to wax and wane with intermittent cough.  She is currently taking Breztri twice daily.  Uses albuterol or DuoNeb twice daily and sometimes will use an albuterol nebulizer in the middle day if she is having flare of symptoms.  Feels that today is a good day for her breathing.   She denies any discolored mucus, hemoptysis, chest pain.  Patient lives with her family who are her caregivers.  She remains on oxygen, 3 to 4 L.  Appetite is good with no nausea vomiting or diarrhea.       No Known Allergies  Immunization History  Administered Date(s) Administered   PFIZER(Purple Top)SARS-COV-2 Vaccination 01/04/2020, 01/27/2020    Past Medical History:  Diagnosis Date   COPD (chronic obstructive pulmonary disease) (HCC)    Depression    Hypertension     Tobacco History: Social History   Tobacco Use  Smoking Status Former   Packs/day: 1.00   Years: 33.00   Additional pack years: 0.00   Total pack years: 33.00   Types: Cigarettes   Start date: 11/28/1950   Quit date: 11/29/1983   Years since quitting: 39.5  Smokeless Tobacco Never    Counseling given: Not Answered   Outpatient Medications Prior to Visit  Medication Sig Dispense Refill   acetaminophen (TYLENOL) 650 MG CR tablet Take 650 mg by mouth every 8 (eight) hours as needed for pain (or headaches).      albuterol (PROVENTIL) (2.5 MG/3ML) 0.083% nebulizer solution Take 3 mLs (2.5 mg total) by nebulization every 6 (six) hours as needed for wheezing or shortness of breath. 150 mL 5   amLODipine (NORVASC) 10 MG tablet Take 10 mg by mouth daily as needed (IF SYSTOLIC NUMBER IS AT LEAST 100 OR GREATER).      aspirin 325 MG tablet Take 325 mg by mouth daily.     atorvastatin (LIPITOR) 40 MG tablet Take 40 mg by mouth at bedtime.     citalopram (CELEXA) 20 MG tablet Take 20 mg by mouth daily.     diclofenac (VOLTAREN) 50 MG EC tablet Take 50 mg by mouth 2 (two) times daily.     furosemide (LASIX) 20 MG tablet Take 20-40 mg by mouth See admin instructions. Take 40 mg by mouth in the morning and 20 mg at bedtime     Multiple Vitamin (MULTIVITAMIN) tablet Take 1 tablet by mouth daily.     olmesartan (BENICAR) 20 MG tablet Take 10 mg by mouth at bedtime.     tiZANidine (ZANAFLEX) 2 MG tablet Take 2 mg by mouth every 8 (eight) hours as  needed for muscle spasms.      traMADol (ULTRAM) 50 MG tablet Take 50 mg by mouth daily as needed for moderate pain.     albuterol (VENTOLIN HFA) 108 (90 Base) MCG/ACT inhaler Inhale 2 puffs into the lungs every 6 (six) hours as needed for wheezing or shortness of breath. 18 g 2   Budeson-Glycopyrrol-Formoterol (BREZTRI AEROSPHERE) 160-9-4.8 MCG/ACT AERO Inhale 2 puffs into the lungs in the morning and at bedtime. 10.7 g 5   ipratropium-albuterol (DUONEB) 0.5-2.5 (3) MG/3ML SOLN Take 3 mLs by nebulization 2 (two) times daily. 360 mL 6   predniSONE (DELTASONE) 20 MG tablet Take 1 tablet (20 mg total) by mouth daily. 5 tablet 0   albuterol (PROAIR HFA) 108 (90 Base) MCG/ACT inhaler Inhale 2 puffs into the lungs every 6 (six) hours as needed for  wheezing or shortness of breath. 1 each 5   diclofenac Sodium (VOLTAREN) 1 % GEL Apply 2 g topically 4 (four) times daily as needed. 50 g 0   No facility-administered medications prior to visit.     Review of Systems:   Constitutional:   No  weight loss, night sweats,  Fevers, chills, + fatigue, or  lassitude.  HEENT:   No headaches,  Difficulty swallowing,  Tooth/dental problems, or  Sore throat,                No sneezing, itching, ear ache, nasal congestion, post nasal drip,   CV:  No chest pain,  Orthopnea, PND, swelling in lower extremities, anasarca, dizziness, palpitations, syncope.   GI  No heartburn, indigestion, abdominal pain, nausea, vomiting, diarrhea, change in bowel habits, loss of appetite, bloody stools.   Resp:   No chest wall deformity  Skin: no rash or lesions.  GU: no dysuria, change in color of urine, no urgency or frequency.  No flank pain, no hematuria   MS:  No joint pain or swelling.  No decreased range of motion.  No back pain.    Physical Exam  BP (!) 100/48 (BP Location: Right Arm, Patient Position: Sitting, Cuff Size: Normal)   Pulse 69   Temp 98.3 F (36.8 C) (Oral)   Ht 5\' 6"  (1.676 m)   Wt 121 lb 3.2 oz (55 kg)   SpO2 92%   BMI 19.56 kg/m   GEN: A/Ox3; pleasant , NAD, chronically ill-appearing, on oxygen, in wheelchair   HEENT:  [place]/AT,   NOSE-clear, THROAT-clear, no lesions, no postnasal drip or exudate noted.   NECK:  Supple w/ fair ROM; no JVD; normal carotid impulses w/o bruits; no thyromegaly or nodules palpated; no lymphadenopathy.    RESP  Clear  P & A; w/o, wheezes/ rales/ or rhonchi. no accessory muscle use, no dullness to percussion  CARD:  RRR, no m/r/g, 1+ peripheral edema, pulses intact, no cyanosis or clubbing.  Stasis dermatitis changes  GI:   Soft & nt; nml bowel sounds; no organomegaly or masses detected.   Musco: Warm bil, no deformities or joint swelling noted.   Neuro: alert, no focal deficits noted.    Skin:  Warm, no lesions or rashes    Lab Results:  CBC   BMET     ProBNP No results found for: "PROBNP"  Imaging: No results found.        No data to display          No results found for: "NITRICOXIDE"      Assessment & Plan:   COPD (chronic obstructive pulmonary disease) (HCC)  Presumed severe COPD - oxygen dependent-continue on triple therapy . Use albuterol inhaler or neb . Stop duoneb.    Plan  Patient Instructions  Continue on Breztri 2 puffs Twice daily  , rinse after use. Use with spacer.  Albuterol inhaler or Albuterol neb  As needed   Continue on Oxygen 3l/m rest and 4l/m with activity. Goal is to keep O2 sats >88-90%.  Follow up with Dr. Tonia Brooms or Aviela Blundell NP in 4 months and As needed   Please contact office for sooner follow up if symptoms do not improve or worsen or seek emergency care     Respiratory failure (HCC) Chronic respiratory failure-continue on oxygen to maintain O2 saturations greater than 88 to 90%.    Rubye Oaks, NP 05/30/2023

## 2023-05-30 NOTE — Assessment & Plan Note (Signed)
Presumed severe COPD - oxygen dependent-continue on triple therapy . Use albuterol inhaler or neb . Stop duoneb.    Plan  Patient Instructions  Continue on Breztri 2 puffs Twice daily  , rinse after use. Use with spacer.  Albuterol inhaler or Albuterol neb  As needed   Continue on Oxygen 3l/m rest and 4l/m with activity. Goal is to keep O2 sats >88-90%.  Follow up with Dr. Tonia Brooms or Hovanes Hymas NP in 4 months and As needed   Please contact office for sooner follow up if symptoms do not improve or worsen or seek emergency care

## 2023-05-30 NOTE — Progress Notes (Signed)
Patient seen in the office today and instructed on use of spacer with albuterol and breztri inhalers.  Patient expressed understanding and demonstrated technique.

## 2023-05-30 NOTE — Patient Instructions (Addendum)
Continue on Breztri 2 puffs Twice daily  , rinse after use. Use with spacer.  Albuterol inhaler or Albuterol neb  As needed   Continue on Oxygen 3l/m rest and 4l/m with activity. Goal is to keep O2 sats >88-90%.  Follow up with Dr. Tonia Brooms or Lyana Asbill NP in 4 months and As needed   Please contact office for sooner follow up if symptoms do not improve or worsen or seek emergency care

## 2023-06-11 DIAGNOSIS — J449 Chronic obstructive pulmonary disease, unspecified: Secondary | ICD-10-CM | POA: Diagnosis not present

## 2023-06-14 DIAGNOSIS — J9611 Chronic respiratory failure with hypoxia: Secondary | ICD-10-CM | POA: Diagnosis not present

## 2023-06-14 DIAGNOSIS — E7800 Pure hypercholesterolemia, unspecified: Secondary | ICD-10-CM | POA: Diagnosis not present

## 2023-06-14 DIAGNOSIS — J449 Chronic obstructive pulmonary disease, unspecified: Secondary | ICD-10-CM | POA: Diagnosis not present

## 2023-06-14 DIAGNOSIS — I10 Essential (primary) hypertension: Secondary | ICD-10-CM | POA: Diagnosis not present

## 2023-06-14 DIAGNOSIS — D509 Iron deficiency anemia, unspecified: Secondary | ICD-10-CM | POA: Diagnosis not present

## 2023-06-14 DIAGNOSIS — I700 Atherosclerosis of aorta: Secondary | ICD-10-CM | POA: Diagnosis not present

## 2023-07-09 ENCOUNTER — Encounter (HOSPITAL_COMMUNITY): Payer: Self-pay

## 2023-07-09 ENCOUNTER — Other Ambulatory Visit: Payer: Self-pay

## 2023-07-09 ENCOUNTER — Inpatient Hospital Stay (HOSPITAL_COMMUNITY)
Admission: EM | Admit: 2023-07-09 | Discharge: 2023-07-13 | DRG: 482 | Disposition: A | Discharge status: Skilled Nursing Facility | Payer: Medicare Other | Attending: Family Medicine | Admitting: Family Medicine

## 2023-07-09 ENCOUNTER — Emergency Department (HOSPITAL_COMMUNITY): Payer: Medicare Other

## 2023-07-09 DIAGNOSIS — E782 Mixed hyperlipidemia: Secondary | ICD-10-CM | POA: Diagnosis not present

## 2023-07-09 DIAGNOSIS — J449 Chronic obstructive pulmonary disease, unspecified: Secondary | ICD-10-CM | POA: Diagnosis present

## 2023-07-09 DIAGNOSIS — S72001A Fracture of unspecified part of neck of right femur, initial encounter for closed fracture: Secondary | ICD-10-CM | POA: Diagnosis not present

## 2023-07-09 DIAGNOSIS — W1830XA Fall on same level, unspecified, initial encounter: Secondary | ICD-10-CM | POA: Diagnosis present

## 2023-07-09 DIAGNOSIS — S0990XA Unspecified injury of head, initial encounter: Secondary | ICD-10-CM | POA: Diagnosis not present

## 2023-07-09 DIAGNOSIS — E785 Hyperlipidemia, unspecified: Secondary | ICD-10-CM | POA: Diagnosis not present

## 2023-07-09 DIAGNOSIS — Z7982 Long term (current) use of aspirin: Secondary | ICD-10-CM | POA: Diagnosis not present

## 2023-07-09 DIAGNOSIS — M25551 Pain in right hip: Secondary | ICD-10-CM | POA: Diagnosis not present

## 2023-07-09 DIAGNOSIS — Z7951 Long term (current) use of inhaled steroids: Secondary | ICD-10-CM

## 2023-07-09 DIAGNOSIS — D638 Anemia in other chronic diseases classified elsewhere: Secondary | ICD-10-CM | POA: Diagnosis not present

## 2023-07-09 DIAGNOSIS — D509 Iron deficiency anemia, unspecified: Secondary | ICD-10-CM | POA: Diagnosis present

## 2023-07-09 DIAGNOSIS — Y92009 Unspecified place in unspecified non-institutional (private) residence as the place of occurrence of the external cause: Secondary | ICD-10-CM

## 2023-07-09 DIAGNOSIS — Z9981 Dependence on supplemental oxygen: Secondary | ICD-10-CM | POA: Diagnosis not present

## 2023-07-09 DIAGNOSIS — Z043 Encounter for examination and observation following other accident: Secondary | ICD-10-CM | POA: Diagnosis not present

## 2023-07-09 DIAGNOSIS — S72141A Displaced intertrochanteric fracture of right femur, initial encounter for closed fracture: Principal | ICD-10-CM | POA: Diagnosis present

## 2023-07-09 DIAGNOSIS — I700 Atherosclerosis of aorta: Secondary | ICD-10-CM | POA: Diagnosis not present

## 2023-07-09 DIAGNOSIS — Z79899 Other long term (current) drug therapy: Secondary | ICD-10-CM

## 2023-07-09 DIAGNOSIS — I10 Essential (primary) hypertension: Secondary | ICD-10-CM | POA: Diagnosis present

## 2023-07-09 DIAGNOSIS — F32A Depression, unspecified: Secondary | ICD-10-CM | POA: Diagnosis present

## 2023-07-09 DIAGNOSIS — M25572 Pain in left ankle and joints of left foot: Secondary | ICD-10-CM | POA: Diagnosis not present

## 2023-07-09 DIAGNOSIS — W19XXXA Unspecified fall, initial encounter: Secondary | ICD-10-CM

## 2023-07-09 DIAGNOSIS — I6782 Cerebral ischemia: Secondary | ICD-10-CM | POA: Diagnosis not present

## 2023-07-09 DIAGNOSIS — S79911A Unspecified injury of right hip, initial encounter: Secondary | ICD-10-CM | POA: Diagnosis not present

## 2023-07-09 DIAGNOSIS — Z87891 Personal history of nicotine dependence: Secondary | ICD-10-CM

## 2023-07-09 DIAGNOSIS — G44309 Post-traumatic headache, unspecified, not intractable: Secondary | ICD-10-CM | POA: Diagnosis not present

## 2023-07-09 LAB — CBC WITH DIFFERENTIAL/PLATELET
Abs Immature Granulocytes: 0.04 10*3/uL (ref 0.00–0.07)
Basophils Absolute: 0.1 10*3/uL (ref 0.0–0.1)
Basophils Relative: 1 %
Eosinophils Absolute: 0.5 10*3/uL (ref 0.0–0.5)
Eosinophils Relative: 6 %
HCT: 32.2 % — ABNORMAL LOW (ref 36.0–46.0)
Hemoglobin: 9.8 g/dL — ABNORMAL LOW (ref 12.0–15.0)
Immature Granulocytes: 1 %
Lymphocytes Relative: 10 %
Lymphs Abs: 0.7 10*3/uL (ref 0.7–4.0)
MCH: 28.1 pg (ref 26.0–34.0)
MCHC: 30.4 g/dL (ref 30.0–36.0)
MCV: 92.3 fL (ref 80.0–100.0)
Monocytes Absolute: 0.4 10*3/uL (ref 0.1–1.0)
Monocytes Relative: 6 %
Neutro Abs: 5.5 10*3/uL (ref 1.7–7.7)
Neutrophils Relative %: 76 %
Platelets: 219 10*3/uL (ref 150–400)
RBC: 3.49 MIL/uL — ABNORMAL LOW (ref 3.87–5.11)
RDW: 13.9 % (ref 11.5–15.5)
WBC: 7.2 10*3/uL (ref 4.0–10.5)
nRBC: 0 % (ref 0.0–0.2)

## 2023-07-09 MED ORDER — FENTANYL CITRATE PF 50 MCG/ML IJ SOSY
50.0000 ug | PREFILLED_SYRINGE | Freq: Once | INTRAMUSCULAR | Status: AC
  Administered 2023-07-09: 50 ug via INTRAVENOUS
  Filled 2023-07-09: qty 1

## 2023-07-09 NOTE — ED Triage Notes (Signed)
BIBA - per EMS family witnessed pt fall after losing her balance while ambulating with her cane. C/o right hip pain radiating to right knee. Denies LOC. Not on a blood thinner.

## 2023-07-09 NOTE — ED Provider Notes (Signed)
[place] EMERGENCY DEPARTMENT AT [facility] Provider Note   CSN: [id number] Arrival date & time: 07/09/23  2310     History {Add pertinent medical, surgical, social history, OB history to HPI:1} Chief Complaint  Patient presents with   Hip Pain    Right hip injury/Fall    Chloe Wilcox is a 88 y.o. female.  88 year old female with a history of hypertension, COPD, depression presents for evaluation after a fall.  Fall was witnessed by family.  They state that patient lost her balance when ambulating with her cane.  The patient recalls messing with a water bottle on the floor.  She denies LOC, but cannot expand on the events because it "happened so fast". C/o R hip pain aggravated by movement. No headache, extremity numbness or weakness, nausea or vomiting.  The patient is not chronically anticoagulated.  The history is provided by the patient and the EMS personnel. No language interpreter was used.  Hip Pain       Home Medications Prior to Admission medications   Medication Sig Start Date End Date Taking? Authorizing Provider  acetaminophen (TYLENOL) 650 MG CR tablet Take 650 mg by mouth every 8 (eight) hours as needed for pain (or headaches).     [provider]  albuterol (PROVENTIL) (2.5 MG/3ML) 0.083% nebulizer solution Take 3 mLs (2.5 mg total) by nebulization every 6 (six) hours as needed for wheezing or shortness of breath. 05/01/23   Parrett, Virgel Bouquet, NP  albuterol (VENTOLIN HFA) 108 (90 Base) MCG/ACT inhaler Inhale 2 puffs into the lungs every 6 (six) hours as needed for wheezing or shortness of breath. 05/30/23   Parrett, Virgel Bouquet, NP  amLODipine (NORVASC) 10 MG tablet Take 10 mg by mouth daily as needed (IF SYSTOLIC NUMBER IS AT LEAST 100 OR GREATER).  05/21/19   [provider]  aspirin 325 MG tablet Take 325 mg by mouth daily.    [provider]  atorvastatin (LIPITOR) 40 MG tablet Take 40 mg by mouth at bedtime.    [provider]  Budeson-Glycopyrrol-Formoterol (BREZTRI AEROSPHERE) 160-9-4.8 MCG/ACT AERO Inhale 2 puffs into the lungs in the morning and at bedtime. 05/30/23   Parrett, Virgel Bouquet, NP  citalopram (CELEXA) 20 MG tablet Take 20 mg by mouth daily.    [provider]  diclofenac (VOLTAREN) 50 MG EC tablet Take 50 mg by mouth 2 (two) times daily.    [provider]  furosemide (LASIX) 20 MG tablet Take 20-40 mg by mouth See admin instructions. Take 40 mg by mouth in the morning and 20 mg at bedtime 11/25/19   [provider]  Multiple Vitamin (MULTIVITAMIN) tablet Take 1 tablet by mouth daily.    [provider]  olmesartan (BENICAR) 20 MG tablet Take 10 mg by mouth at bedtime.    [provider]  tiZANidine (ZANAFLEX) 2 MG tablet Take 2 mg by mouth every 8 (eight) hours as needed for muscle spasms.  12/02/19   [provider]  traMADol (ULTRAM) 50 MG tablet Take 50 mg by mouth daily as needed for moderate pain.    [provider]      Allergies    Patient has no known allergies.    Review of Systems   Review of Systems Ten systems reviewed and are negative for acute change, except as noted in the HPI.    Physical Exam Updated Vital Signs BP (!) 156/76   Pulse 74   Temp 97.8  F (36.6 C) (Oral)   Resp 18   Ht 5\' 2"  (1.575 m)   Wt 52.6 kg   SpO2 100%   BMI 21.22 kg/m   Physical Exam Vitals and nursing note reviewed.  Constitutional:      General: She is not in acute distress.    Appearance: She is well-developed. She is not diaphoretic.     Comments: Nontoxic appearing and in NAD  HENT:     Head: Normocephalic and atraumatic.     Comments: No hematoma or contusion to scalp.  No Battle sign or raccoon's eyes. Eyes:     General: No scleral icterus.    Extraocular Movements: EOM normal.     Conjunctiva/sclera: Conjunctivae normal.  Cardiovascular:     Rate and Rhythm: Normal rate and regular rhythm.     Pulses: Normal  pulses.     Comments: DP pulse 2+ in BLE Pulmonary:     Effort: Pulmonary effort is normal. No respiratory distress.     Comments: Respirations even and unlabored Musculoskeletal:        General: Normal range of motion.     Cervical back: Normal range of motion.     Comments: RLE shortening and external rotation  Skin:    General: Skin is warm and dry.     Coloration: Skin is not pale.     Findings: No erythema or rash.  Neurological:     Mental Status: She is alert and oriented to person, place, and time.     Coordination: Coordination normal.  Psychiatric:        Mood and Affect: Mood and affect normal.        Behavior: Behavior normal.     ED Results / Procedures / Treatments   Labs (all labs ordered are listed, but only abnormal results are displayed) Labs Reviewed  CBC WITH DIFFERENTIAL/PLATELET  BASIC METABOLIC PANEL    EKG EKG Interpretation Date/Time:  Sunday July 09 2023 23:27:52 EDT Ventricular Rate:  73 PR Interval:  162 QRS Duration:  82 QT Interval:  372 QTC Calculation: 409 R Axis:   74  Text Interpretation: Normal sinus rhythm Minimal voltage criteria for LVH, may be normal variant ( Sokolow-Lyon ) Nonspecific ST abnormality Abnormal ECG Poor data quality Confirmed by Tilden Fossa [phone]) on 07/09/2023 11:33:37 PM  Radiology No results found.  Procedures Procedures  {Document cardiac monitor, telemetry assessment procedure when appropriate:1}  Medications Ordered in ED Medications  fentaNYL (SUBLIMAZE) injection 50 mcg (has no administration in time range)    ED Course/ Medical Decision Making/ A&P   {   Click here for ABCD2, HEART and other calculatorsREFRESH Note before signing :1}                              Medical Decision Making Amount and/or Complexity of Data Reviewed Labs: ordered. Radiology: ordered. ECG/medicine tests: ordered.   ***  {Document critical care time when appropriate:1} {Document review of labs and  clinical decision tools ie heart score, Chads2Vasc2 etc:1}  {Document your independent review of radiology images, and any outside records:1} {Document your discussion with family members, caretakers, and with consultants:1} {Document social determinants of health affecting pt's care:1} {Document your decision making why or why not admission, treatments were needed:1} Final Clinical Impression(s) / ED Diagnoses Final diagnoses:  None    Rx / DC Orders ED Discharge Orders     None

## 2023-07-10 ENCOUNTER — Inpatient Hospital Stay (HOSPITAL_COMMUNITY): Payer: Medicare Other | Admitting: Certified Registered Nurse Anesthetist

## 2023-07-10 ENCOUNTER — Inpatient Hospital Stay (HOSPITAL_COMMUNITY): Payer: Medicare Other

## 2023-07-10 ENCOUNTER — Encounter (HOSPITAL_COMMUNITY)
Admission: EM | Disposition: A | Discharge status: Skilled Nursing Facility | Payer: Self-pay | Source: Home / Self Care | Attending: Internal Medicine

## 2023-07-10 ENCOUNTER — Encounter (HOSPITAL_COMMUNITY): Payer: Self-pay | Admitting: Internal Medicine

## 2023-07-10 DIAGNOSIS — Z87891 Personal history of nicotine dependence: Secondary | ICD-10-CM

## 2023-07-10 DIAGNOSIS — J440 Chronic obstructive pulmonary disease with acute lower respiratory infection: Secondary | ICD-10-CM | POA: Diagnosis not present

## 2023-07-10 DIAGNOSIS — Z7951 Long term (current) use of inhaled steroids: Secondary | ICD-10-CM | POA: Diagnosis not present

## 2023-07-10 DIAGNOSIS — E782 Mixed hyperlipidemia: Secondary | ICD-10-CM

## 2023-07-10 DIAGNOSIS — Z4781 Encounter for orthopedic aftercare following surgical amputation: Secondary | ICD-10-CM | POA: Diagnosis not present

## 2023-07-10 DIAGNOSIS — S72141A Displaced intertrochanteric fracture of right femur, initial encounter for closed fracture: Secondary | ICD-10-CM | POA: Diagnosis not present

## 2023-07-10 DIAGNOSIS — S72091D Other fracture of head and neck of right femur, subsequent encounter for closed fracture with routine healing: Secondary | ICD-10-CM | POA: Diagnosis not present

## 2023-07-10 DIAGNOSIS — J9601 Acute respiratory failure with hypoxia: Secondary | ICD-10-CM | POA: Diagnosis not present

## 2023-07-10 DIAGNOSIS — D509 Iron deficiency anemia, unspecified: Secondary | ICD-10-CM | POA: Diagnosis not present

## 2023-07-10 DIAGNOSIS — Z743 Need for continuous supervision: Secondary | ICD-10-CM | POA: Diagnosis not present

## 2023-07-10 DIAGNOSIS — E785 Hyperlipidemia, unspecified: Secondary | ICD-10-CM | POA: Diagnosis present

## 2023-07-10 DIAGNOSIS — J449 Chronic obstructive pulmonary disease, unspecified: Secondary | ICD-10-CM | POA: Diagnosis not present

## 2023-07-10 DIAGNOSIS — F32A Depression, unspecified: Secondary | ICD-10-CM | POA: Diagnosis present

## 2023-07-10 DIAGNOSIS — R278 Other lack of coordination: Secondary | ICD-10-CM | POA: Diagnosis not present

## 2023-07-10 DIAGNOSIS — Z86718 Personal history of other venous thrombosis and embolism: Secondary | ICD-10-CM | POA: Diagnosis not present

## 2023-07-10 DIAGNOSIS — G44309 Post-traumatic headache, unspecified, not intractable: Secondary | ICD-10-CM | POA: Diagnosis not present

## 2023-07-10 DIAGNOSIS — R6889 Other general symptoms and signs: Secondary | ICD-10-CM | POA: Diagnosis not present

## 2023-07-10 DIAGNOSIS — W19XXXA Unspecified fall, initial encounter: Secondary | ICD-10-CM

## 2023-07-10 DIAGNOSIS — Z7401 Bed confinement status: Secondary | ICD-10-CM | POA: Diagnosis not present

## 2023-07-10 DIAGNOSIS — Y92009 Unspecified place in unspecified non-institutional (private) residence as the place of occurrence of the external cause: Secondary | ICD-10-CM | POA: Diagnosis not present

## 2023-07-10 DIAGNOSIS — I10 Essential (primary) hypertension: Secondary | ICD-10-CM

## 2023-07-10 DIAGNOSIS — D638 Anemia in other chronic diseases classified elsewhere: Secondary | ICD-10-CM

## 2023-07-10 DIAGNOSIS — Z79899 Other long term (current) drug therapy: Secondary | ICD-10-CM | POA: Diagnosis not present

## 2023-07-10 DIAGNOSIS — I6782 Cerebral ischemia: Secondary | ICD-10-CM | POA: Diagnosis not present

## 2023-07-10 DIAGNOSIS — Z9981 Dependence on supplemental oxygen: Secondary | ICD-10-CM

## 2023-07-10 DIAGNOSIS — W1830XA Fall on same level, unspecified, initial encounter: Secondary | ICD-10-CM | POA: Diagnosis present

## 2023-07-10 DIAGNOSIS — Z7982 Long term (current) use of aspirin: Secondary | ICD-10-CM | POA: Diagnosis not present

## 2023-07-10 DIAGNOSIS — Z043 Encounter for examination and observation following other accident: Secondary | ICD-10-CM | POA: Diagnosis not present

## 2023-07-10 DIAGNOSIS — S72144A Nondisplaced intertrochanteric fracture of right femur, initial encounter for closed fracture: Secondary | ICD-10-CM | POA: Diagnosis not present

## 2023-07-10 DIAGNOSIS — R2681 Unsteadiness on feet: Secondary | ICD-10-CM | POA: Diagnosis not present

## 2023-07-10 DIAGNOSIS — R2689 Other abnormalities of gait and mobility: Secondary | ICD-10-CM | POA: Diagnosis not present

## 2023-07-10 DIAGNOSIS — M6281 Muscle weakness (generalized): Secondary | ICD-10-CM | POA: Diagnosis not present

## 2023-07-10 DIAGNOSIS — S79929A Unspecified injury of unspecified thigh, initial encounter: Secondary | ICD-10-CM | POA: Diagnosis not present

## 2023-07-10 DIAGNOSIS — Z9181 History of falling: Secondary | ICD-10-CM | POA: Diagnosis not present

## 2023-07-10 DIAGNOSIS — S72001A Fracture of unspecified part of neck of right femur, initial encounter for closed fracture: Secondary | ICD-10-CM | POA: Diagnosis present

## 2023-07-10 DIAGNOSIS — R531 Weakness: Secondary | ICD-10-CM | POA: Diagnosis not present

## 2023-07-10 DIAGNOSIS — I700 Atherosclerosis of aorta: Secondary | ICD-10-CM | POA: Diagnosis not present

## 2023-07-10 DIAGNOSIS — S72141D Displaced intertrochanteric fracture of right femur, subsequent encounter for closed fracture with routine healing: Secondary | ICD-10-CM | POA: Diagnosis not present

## 2023-07-10 HISTORY — PX: INTRAMEDULLARY (IM) NAIL INTERTROCHANTERIC: SHX5875

## 2023-07-10 LAB — TYPE AND SCREEN

## 2023-07-10 LAB — URINALYSIS, COMPLETE (UACMP) WITH MICROSCOPIC
Bacteria, UA: NONE SEEN
Bilirubin Urine: NEGATIVE
Glucose, UA: NEGATIVE mg/dL
Hgb urine dipstick: NEGATIVE
Ketones, ur: NEGATIVE mg/dL
Leukocytes,Ua: NEGATIVE
Nitrite: NEGATIVE
Protein, ur: NEGATIVE mg/dL
Specific Gravity, Urine: 1.014 (ref 1.005–1.030)
pH: 7 (ref 5.0–8.0)

## 2023-07-10 LAB — COMPREHENSIVE METABOLIC PANEL
ALT: 16 U/L (ref 0–44)
AST: 17 U/L (ref 15–41)
Albumin: 3.9 g/dL (ref 3.5–5.0)
Alkaline Phosphatase: 64 U/L (ref 38–126)
Anion gap: 6 (ref 5–15)
BUN: 10 mg/dL (ref 8–23)
CO2: 34 mmol/L — ABNORMAL HIGH (ref 22–32)
Calcium: 8.8 mg/dL — ABNORMAL LOW (ref 8.9–10.3)
Chloride: 96 mmol/L — ABNORMAL LOW (ref 98–111)
Creatinine, Ser: 0.46 mg/dL (ref 0.44–1.00)
GFR, Estimated: >60 mL/min (ref >60)
Glucose, Bld: 129 mg/dL — ABNORMAL HIGH (ref 70–99)
Potassium: 3.1 mmol/L — ABNORMAL LOW (ref 3.5–5.1)
Sodium: 136 mmol/L (ref 135–145)
Total Bilirubin: 0.7 mg/dL (ref 0.3–1.2)
Total Protein: 6.1 g/dL — ABNORMAL LOW (ref 6.5–8.1)

## 2023-07-10 LAB — CBC WITH DIFFERENTIAL/PLATELET
Abs Immature Granulocytes: 0.05 10*3/uL (ref 0.00–0.07)
Basophils Absolute: 0 10*3/uL (ref 0.0–0.1)
Basophils Relative: 0 %
Eosinophils Absolute: 0.1 10*3/uL (ref 0.0–0.5)
Eosinophils Relative: 1 %
HCT: 29.7 % — ABNORMAL LOW (ref 36.0–46.0)
Hemoglobin: 9.1 g/dL — ABNORMAL LOW (ref 12.0–15.0)
Immature Granulocytes: 1 %
Lymphocytes Relative: 6 %
Lymphs Abs: 0.6 10*3/uL — ABNORMAL LOW (ref 0.7–4.0)
MCH: 28.5 pg (ref 26.0–34.0)
MCHC: 30.6 g/dL (ref 30.0–36.0)
MCV: 93.1 fL (ref 80.0–100.0)
Monocytes Absolute: 0.6 10*3/uL (ref 0.1–1.0)
Monocytes Relative: 6 %
Neutro Abs: 9.6 10*3/uL — ABNORMAL HIGH (ref 1.7–7.7)
Neutrophils Relative %: 86 %
Platelets: 198 10*3/uL (ref 150–400)
RBC: 3.19 MIL/uL — ABNORMAL LOW (ref 3.87–5.11)
RDW: 13.9 % (ref 11.5–15.5)
WBC: 11.1 10*3/uL — ABNORMAL HIGH (ref 4.0–10.5)
nRBC: 0 % (ref 0.0–0.2)

## 2023-07-10 LAB — PROTIME-INR
INR: 1.2 (ref 0.8–1.2)
Prothrombin Time: 15.3 seconds — ABNORMAL HIGH (ref 11.4–15.2)

## 2023-07-10 LAB — SURGICAL PCR SCREEN
MRSA, PCR: NEGATIVE
Staphylococcus aureus: NEGATIVE

## 2023-07-10 LAB — MAGNESIUM: Magnesium: 2 mg/dL (ref 1.7–2.4)

## 2023-07-10 LAB — VITAMIN D 25 HYDROXY (VIT D DEFICIENCY, FRACTURES): Vit D, 25-Hydroxy: 10.47 ng/mL — ABNORMAL LOW (ref 30–100)

## 2023-07-10 SURGERY — FIXATION, FRACTURE, INTERTROCHANTERIC, WITH INTRAMEDULLARY ROD
Anesthesia: Spinal | Site: Hip | Laterality: Right

## 2023-07-10 MED ORDER — KETAMINE HCL 10 MG/ML IJ SOLN
INTRAMUSCULAR | Status: DC | PRN
  Administered 2023-07-10: 30 mg via INTRAVENOUS
  Administered 2023-07-10: 20 mg via INTRAVENOUS

## 2023-07-10 MED ORDER — ONDANSETRON HCL 4 MG/2ML IJ SOLN: 4.0000 mg | Freq: Once | INTRAMUSCULAR | Status: DC | PRN

## 2023-07-10 MED ORDER — PHENYLEPHRINE HCL-NACL 20-0.9 MG/250ML-% IV SOLN
INTRAVENOUS | Status: DC | PRN
  Administered 2023-07-10: 40 ug/min via INTRAVENOUS

## 2023-07-10 MED ORDER — ORAL CARE MOUTH RINSE: 15.0000 mL | OROMUCOSAL | Status: DC | PRN

## 2023-07-10 MED ORDER — HYDRALAZINE HCL 20 MG/ML IJ SOLN: 5.0000 mg | INTRAMUSCULAR | Status: DC | PRN

## 2023-07-10 MED ORDER — ACETAMINOPHEN 10 MG/ML IV SOLN
1000.0000 mg | Freq: Once | INTRAVENOUS | Status: DC | PRN
  Administered 2023-07-10: 1000 mg via INTRAVENOUS

## 2023-07-10 MED ORDER — ONDANSETRON HCL 4 MG/2ML IJ SOLN
INTRAMUSCULAR | Status: DC | PRN
  Administered 2023-07-10: 4 mg via INTRAVENOUS

## 2023-07-10 MED ORDER — CHLORHEXIDINE GLUCONATE 0.12 % MT SOLN
15.0000 mL | Freq: Once | OROMUCOSAL | Status: AC
  Administered 2023-07-10: 15 mL via OROMUCOSAL

## 2023-07-10 MED ORDER — FENTANYL CITRATE PF 50 MCG/ML IJ SOSY
PREFILLED_SYRINGE | INTRAMUSCULAR | Status: AC
  Administered 2023-07-10: 25 ug via INTRAVENOUS
  Filled 2023-07-10: qty 2

## 2023-07-10 MED ORDER — ONDANSETRON HCL 4 MG/2ML IJ SOLN
INTRAMUSCULAR | Status: AC
  Filled 2023-07-10: qty 2

## 2023-07-10 MED ORDER — PROPOFOL 500 MG/50ML IV EMUL
INTRAVENOUS | Status: AC
  Filled 2023-07-10: qty 50

## 2023-07-10 MED ORDER — SODIUM CHLORIDE 0.9 % IV SOLN: INTRAVENOUS | Status: AC

## 2023-07-10 MED ORDER — HYDROMORPHONE HCL 1 MG/ML IJ SOLN
0.5000 mg | INTRAMUSCULAR | Status: DC | PRN
  Administered 2023-07-10 – 2023-07-11 (×2): 0.5 mg via INTRAVENOUS
  Filled 2023-07-10: qty 1
  Filled 2023-07-10: qty 0.5

## 2023-07-10 MED ORDER — ACETAMINOPHEN 10 MG/ML IV SOLN
INTRAVENOUS | Status: AC
  Filled 2023-07-10: qty 100

## 2023-07-10 MED ORDER — TRANEXAMIC ACID-NACL 1000-0.7 MG/100ML-% IV SOLN
INTRAVENOUS | Status: AC
  Filled 2023-07-10: qty 100

## 2023-07-10 MED ORDER — FENTANYL CITRATE (PF) 100 MCG/2ML IJ SOLN
INTRAMUSCULAR | Status: AC
  Filled 2023-07-10: qty 2

## 2023-07-10 MED ORDER — FENTANYL CITRATE PF 50 MCG/ML IJ SOSY
25.0000 ug | PREFILLED_SYRINGE | INTRAMUSCULAR | Status: DC | PRN
  Administered 2023-07-10: 50 ug via INTRAVENOUS
  Administered 2023-07-10: 25 ug via INTRAVENOUS

## 2023-07-10 MED ORDER — KETAMINE HCL 50 MG/5ML IJ SOSY
PREFILLED_SYRINGE | INTRAMUSCULAR | Status: AC
  Filled 2023-07-10: qty 5

## 2023-07-10 MED ORDER — OXYCODONE HCL 5 MG PO TABS: 5.0000 mg | ORAL_TABLET | Freq: Once | ORAL | Status: DC | PRN

## 2023-07-10 MED ORDER — TRANEXAMIC ACID-NACL 1000-0.7 MG/100ML-% IV SOLN
INTRAVENOUS | Status: DC | PRN
  Administered 2023-07-10: 1000 mg via INTRAVENOUS

## 2023-07-10 MED ORDER — ACETAMINOPHEN 325 MG PO TABS
650.0000 mg | ORAL_TABLET | Freq: Four times a day (QID) | ORAL | Status: DC | PRN
  Administered 2023-07-11 – 2023-07-12 (×3): 650 mg via ORAL
  Filled 2023-07-10 (×2): qty 2

## 2023-07-10 MED ORDER — UMECLIDINIUM BROMIDE 62.5 MCG/ACT IN AEPB
1.0000 | INHALATION_SPRAY | Freq: Every day | RESPIRATORY_TRACT | Status: DC
  Administered 2023-07-10 – 2023-07-13 (×4): 1 via RESPIRATORY_TRACT
  Filled 2023-07-10: qty 7

## 2023-07-10 MED ORDER — CEFAZOLIN SODIUM-DEXTROSE 2-4 GM/100ML-% IV SOLN
INTRAVENOUS | Status: AC
  Filled 2023-07-10: qty 100

## 2023-07-10 MED ORDER — CEFAZOLIN SODIUM-DEXTROSE 2-3 GM-%(50ML) IV SOLR
INTRAVENOUS | Status: DC | PRN
Start: 2023-07-10 — End: 2023-07-10
  Administered 2023-07-10: 2 g via INTRAVENOUS

## 2023-07-10 MED ORDER — PROPOFOL 10 MG/ML IV BOLUS
INTRAVENOUS | Status: DC | PRN
  Administered 2023-07-10: 20 mg via INTRAVENOUS
  Administered 2023-07-10: 50 mg via INTRAVENOUS
  Administered 2023-07-10: 20 mg via INTRAVENOUS

## 2023-07-10 MED ORDER — ALBUTEROL SULFATE (2.5 MG/3ML) 0.083% IN NEBU: 2.5000 mg | INHALATION_SOLUTION | RESPIRATORY_TRACT | Status: DC | PRN

## 2023-07-10 MED ORDER — ACETAMINOPHEN 650 MG RE SUPP: 650.0000 mg | Freq: Four times a day (QID) | RECTAL | Status: DC | PRN

## 2023-07-10 MED ORDER — BUDESON-GLYCOPYRROL-FORMOTEROL 160-9-4.8 MCG/ACT IN AERO: 2.0000 | INHALATION_SPRAY | Freq: Two times a day (BID) | RESPIRATORY_TRACT | Status: DC

## 2023-07-10 MED ORDER — FLUTICASONE FUROATE-VILANTEROL 200-25 MCG/ACT IN AEPB
1.0000 | INHALATION_SPRAY | Freq: Every day | RESPIRATORY_TRACT | Status: DC
  Administered 2023-07-10 – 2023-07-13 (×4): 1 via RESPIRATORY_TRACT
  Filled 2023-07-10: qty 28

## 2023-07-10 MED ORDER — LACTATED RINGERS IV SOLN: INTRAVENOUS | Status: DC

## 2023-07-10 MED ORDER — FENTANYL CITRATE (PF) 100 MCG/2ML IJ SOLN
INTRAMUSCULAR | Status: DC | PRN
  Administered 2023-07-10: 25 ug via INTRAVENOUS

## 2023-07-10 MED ORDER — ONDANSETRON HCL 4 MG/2ML IJ SOLN: 4.0000 mg | Freq: Four times a day (QID) | INTRAMUSCULAR | Status: DC | PRN

## 2023-07-10 MED ORDER — OXYCODONE HCL 5 MG/5ML PO SOLN: 5.0000 mg | Freq: Once | ORAL | Status: DC | PRN

## 2023-07-10 MED ORDER — POLYETHYLENE GLYCOL 3350 17 G PO PACK
17.0000 g | PACK | Freq: Two times a day (BID) | ORAL | Status: AC
  Administered 2023-07-11 (×2): 17 g via ORAL
  Filled 2023-07-10 (×2): qty 1

## 2023-07-10 SURGICAL SUPPLY — 43 items
APL PRP STRL LF DISP 70% ISPRP (MISCELLANEOUS) ×1
BAG COUNTER SPONGE SURGICOUNT (BAG) IMPLANT
BAG SPEC THK2 15X12 ZIP CLS (MISCELLANEOUS) ×1
BAG SPNG CNTER NS LX DISP (BAG)
BAG ZIPLOCK 12X15 (MISCELLANEOUS) ×1 IMPLANT
BIT DRILL CANN LG 4.3MM (BIT) IMPLANT
BNDG CMPR 5X6 CHSV STRCH STRL (GAUZE/BANDAGES/DRESSINGS) ×1
BNDG COHESIVE 6X5 TAN ST LF (GAUZE/BANDAGES/DRESSINGS) ×1 IMPLANT
CHLORAPREP W/TINT 26 (MISCELLANEOUS) ×1 IMPLANT
COVER PERINEAL POST (MISCELLANEOUS) ×1 IMPLANT
COVER SURGICAL LIGHT HANDLE (MISCELLANEOUS) ×1 IMPLANT
DRAPE C-ARM 42X120 X-RAY (DRAPES) ×1 IMPLANT
DRAPE INCISE IOBAN 66X45 STRL (DRAPES) ×1 IMPLANT
DRAPE STERI IOBAN 125X83 (DRAPES) ×1 IMPLANT
DRESSING MEPILEX FLEX 4X4 (GAUZE/BANDAGES/DRESSINGS) ×3 IMPLANT
DRILL BIT CANN LG 4.3MM (BIT) ×1
DRSG MEPILEX FLEX 4X4 (GAUZE/BANDAGES/DRESSINGS) ×3
ELECT REM PT RETURN 15FT ADLT (MISCELLANEOUS) ×1 IMPLANT
GAUZE SPONGE 4X4 12PLY STRL (GAUZE/BANDAGES/DRESSINGS) IMPLANT
GLOVE BIO SURGEON STRL SZ7.5 (GLOVE) ×1 IMPLANT
GLOVE BIOGEL PI IND STRL 6.5 (GLOVE) ×1 IMPLANT
GLOVE BIOGEL PI IND STRL 8 (GLOVE) ×1 IMPLANT
GLOVE SURG POLYISO LF SZ6.5 (GLOVE) ×1 IMPLANT
GOWN STRL REUS W/ TWL XL LVL3 (GOWN DISPOSABLE) ×1 IMPLANT
GOWN STRL REUS W/TWL XL LVL3 (GOWN DISPOSABLE) ×1
GUIDEPIN VERSANAIL DSP 3.2X444 (ORTHOPEDIC DISPOSABLE SUPPLIES) IMPLANT
HIP FRA NAIL LAG SCREW 10.5X90 (Orthopedic Implant) ×1 IMPLANT
KIT BASIN OR (CUSTOM PROCEDURE TRAY) ×1 IMPLANT
KIT TURNOVER KIT A (KITS) IMPLANT
MANIFOLD NEPTUNE II (INSTRUMENTS) ×1 IMPLANT
NAIL HIP FRACT 130D 9X180 (Orthopedic Implant) IMPLANT
NS IRRIG 1000ML POUR BTL (IV SOLUTION) ×1 IMPLANT
PACK GENERAL/GYN (CUSTOM PROCEDURE TRAY) ×1 IMPLANT
PROTECTOR NERVE ULNAR (MISCELLANEOUS) ×1 IMPLANT
SCREW BONE CORTICAL 5.0X36 (Screw) IMPLANT
SCREW LAG HIP FRA NAIL 10.5X90 (Orthopedic Implant) IMPLANT
STAPLER VISISTAT 35W (STAPLE) ×1 IMPLANT
SUT VIC AB 0 CT1 36 (SUTURE) ×1 IMPLANT
SUT VIC AB 2-0 CT1 27 (SUTURE) ×1
SUT VIC AB 2-0 CT1 27XBRD (SUTURE) ×1 IMPLANT
TOWEL OR 17X26 10 PK STRL BLUE (TOWEL DISPOSABLE) ×1 IMPLANT
TRAY FOLEY MTR SLVR 16FR STAT (SET/KITS/TRAYS/PACK) IMPLANT
WATER STERILE IRR 1000ML POUR (IV SOLUTION) ×2 IMPLANT

## 2023-07-10 NOTE — Anesthesia Procedure Notes (Signed)
Procedure Name: LMA Insertion Date/Time: 07/10/2023 4:33 PM  Performed by: Lezlie Lye, CRNAPre-anesthesia Checklist: Patient identified, Emergency Drugs available, Suction available and Patient being monitored Patient Re-evaluated:Patient Re-evaluated prior to induction Oxygen Delivery Method: Circle system utilized Preoxygenation: Pre-oxygenation with 100% oxygen Induction Type: IV induction Ventilation: Mask ventilation without difficulty LMA: LMA inserted and LMA flexible inserted LMA Size: 4.0 Number of attempts: 1

## 2023-07-10 NOTE — Progress Notes (Signed)
CCC Pre-op Review  Pre-op checklist: asked RN on floor to complete  NPO: since MN 8/12  Labs: WNL  Consent: asked floor RN to obtain  H&P: pending consult note from PA  Vitals: WNL, hypertensive 167/67  O2 requirements: 2LNC 100%  MAR/PTA review: completed  IV: 20g RFA  Floor nurse name:  Herbert Seta RN  Additional info:

## 2023-07-10 NOTE — Consult Note (Signed)
Reason for Consult:right hip fracture Referring Physician: Sallyanne Wilcox is an 88 y.o. female.  HPI: Chloe Wilcox is a 88 y.o. female with medical history significant for COPD, essential hypertension, hyperlipidemia, anemia of chronic disease associated baseline hemoglobin 10-12, who is admitted to [facility] on 07/09/2023 with acute right intertrochanteric hip fracture after ground-level mechanical fall, after presenting from home to [facility] ED complaining of right hip pain. Orthopedics consulted.    Past Medical History:  Diagnosis Date   COPD (chronic obstructive pulmonary disease) (HCC)    Depression    Hypertension     Past Surgical History:  Procedure Laterality Date   VAGINAL HYSTERECTOMY      Family History  Family history unknown: Yes    Social History:  reports that she quit smoking about 39 years ago. Her smoking use included cigarettes. She started smoking about 72 years ago. She has a 33 pack-year smoking history. She has never used smokeless tobacco. She reports that she does not drink alcohol and does not use drugs.  Allergies: No Known Allergies  Medications: I have reviewed the patient's current medications.  Results for orders placed or performed during the hospital encounter of 07/09/23 (from the past 48 hour(s))  CBC with Differential     Status: Abnormal   Collection Time: 07/09/23 11:20 PM  Result Value Ref Range   WBC 7.2 4.0 - 10.5 K/uL   RBC 3.49 (L) 3.87 - 5.11 MIL/uL   Hemoglobin 9.8 (L) 12.0 - 15.0 g/dL   HCT 84.1 (L) 32.4 - 40.1 %   MCV 92.3 80.0 - 100.0 fL   MCH 28.1 26.0 - 34.0 pg   MCHC 30.4 30.0 - 36.0 g/dL   RDW 02.7 25.3 - 66.4 %   Platelets 219 150 - 400 K/uL   nRBC 0.0 0.0 - 0.2 %   Neutrophils Relative % 76 %   Neutro Abs 5.5 1.7 - 7.7 K/uL   Lymphocytes Relative 10 %   Lymphs Abs 0.7 0.7 - 4.0 K/uL   Monocytes Relative 6 %   Monocytes Absolute 0.4 0.1 - 1.0 K/uL   Eosinophils Relative 6 %   Eosinophils Absolute 0.5  0.0 - 0.5 K/uL   Basophils Relative 1 %   Basophils Absolute 0.1 0.0 - 0.1 K/uL   Immature Granulocytes 1 %   Abs Immature Granulocytes 0.04 0.00 - 0.07 K/uL    Comment: Performed at [facility], [address], [place], [place] [address]  Basic metabolic panel     Status: Abnormal   Collection Time: 07/09/23 11:20 PM  Result Value Ref Range   Sodium 139 135 - 145 mmol/L   Potassium 4.0 3.5 - 5.1 mmol/L   Chloride 98 98 - 111 mmol/L   CO2 34 (H) 22 - 32 mmol/L   Glucose, Bld 111 (H) 70 - 99 mg/dL    Comment: Glucose reference range applies only to samples taken after fasting for at least 8 hours.   BUN 12 8 - 23 mg/dL   Creatinine, Ser 4.25 0.44 - 1.00 mg/dL   Calcium 9.1 8.9 - 95.6 mg/dL   GFR, Estimated >38 >75 mL/min    Comment: (NOTE) Calculated using the CKD-EPI Creatinine Equation (2021)    Anion gap 7 5 - 15    Comment: Performed at [facility], [address] [place], [place] [address]  CBC with Differential/Platelet     Status: Abnormal   Collection Time: 07/10/23  4:45  AM  Result Value Ref Range   WBC 11.1 (H) 4.0 - 10.5 K/uL   RBC 3.19 (L) 3.87 - 5.11 MIL/uL   Hemoglobin 9.1 (L) 12.0 - 15.0 g/dL   HCT 24.4 (L) 01.0 - 27.2 %   MCV 93.1 80.0 - 100.0 fL   MCH 28.5 26.0 - 34.0 pg   MCHC 30.6 30.0 - 36.0 g/dL   RDW 53.6 64.4 - 03.4 %   Platelets 198 150 - 400 K/uL   nRBC 0.0 0.0 - 0.2 %   Neutrophils Relative % 86 %   Neutro Abs 9.6 (H) 1.7 - 7.7 K/uL   Lymphocytes Relative 6 %   Lymphs Abs 0.6 (L) 0.7 - 4.0 K/uL   Monocytes Relative 6 %   Monocytes Absolute 0.6 0.1 - 1.0 K/uL   Eosinophils Relative 1 %   Eosinophils Absolute 0.1 0.0 - 0.5 K/uL   Basophils Relative 0 %   Basophils Absolute 0.0 0.0 - 0.1 K/uL   Immature Granulocytes 1 %   Abs Immature Granulocytes 0.05 0.00 - 0.07 K/uL    Comment: Performed at [facility], [address], [place], [place] [address]  Comprehensive metabolic panel      Status: Abnormal   Collection Time: 07/10/23  4:45 AM  Result Value Ref Range   Sodium 136 135 - 145 mmol/L   Potassium 3.1 (L) 3.5 - 5.1 mmol/L   Chloride 96 (L) 98 - 111 mmol/L   CO2 34 (H) 22 - 32 mmol/L   Glucose, Bld 129 (H) 70 - 99 mg/dL    Comment: Glucose reference range applies only to samples taken after fasting for at least 8 hours.   BUN 10 8 - 23 mg/dL   Creatinine, Ser 5.63 0.44 - 1.00 mg/dL   Calcium 8.8 (L) 8.9 - 10.3 mg/dL   Total Protein 6.1 (L) 6.5 - 8.1 g/dL   Albumin 3.9 3.5 - 5.0 g/dL   AST 17 15 - 41 U/L   ALT 16 0 - 44 U/L   Alkaline Phosphatase 64 38 - 126 U/L   Total Bilirubin 0.7 0.3 - 1.2 mg/dL   GFR, Estimated >87 >56 mL/min    Comment: (NOTE) Calculated using the CKD-EPI Creatinine Equation (2021)    Anion gap 6 5 - 15    Comment: Performed at [facility], [address] [place], [place] [address]  Magnesium     Status: None   Collection Time: 07/10/23  4:45 AM  Result Value Ref Range   Magnesium 2.0 1.7 - 2.4 mg/dL    Comment: Performed at [facility], [address] [place], [place] [address]  Protime-INR     Status: Abnormal   Collection Time: 07/10/23  4:45 AM  Result Value Ref Range   Prothrombin Time 15.3 (H) 11.4 - 15.2 seconds   INR 1.2 0.8 - 1.2    Comment: (NOTE) INR goal varies based on device and disease states. Performed at [facility], [address], [place], [place] [address]   Type and screen [facility]     Status: None (Preliminary result)   Collection Time: 07/10/23  4:45 AM  Result Value Ref Range   ABO/RH(D) PENDING    Antibody Screen PENDING    Sample Expiration      07/13/2023,2359 Performed at [facility], [address], [place], [place] [address]   Urinalysis, Complete w Microscopic -Urine, Clean Catch  Status: None   Collection Time: 07/10/23  7:44 AM  Result Value Ref Range   Color, Urine YELLOW YELLOW    APPearance CLEAR CLEAR   Specific Gravity, Urine 1.014 1.005 - 1.030   pH 7.0 5.0 - 8.0   Glucose, UA NEGATIVE NEGATIVE mg/dL   Hgb urine dipstick NEGATIVE NEGATIVE   Bilirubin Urine NEGATIVE NEGATIVE   Ketones, ur NEGATIVE NEGATIVE mg/dL   Protein, ur NEGATIVE NEGATIVE mg/dL   Nitrite NEGATIVE NEGATIVE   Leukocytes,Ua NEGATIVE NEGATIVE   RBC / HPF 0-5 0 - 5 RBC/hpf   WBC, UA 0-5 0 - 5 WBC/hpf   Bacteria, UA NONE SEEN NONE SEEN   Squamous Epithelial / HPF 0-5 0 - 5 /HPF    Comment: Performed at [facility], [address], [place], [place] [address]    CT Head Wo Contrast  Result Date: 07/10/2023 CLINICAL DATA:  Recent fall with headaches, initial encounter EXAM: CT HEAD WITHOUT CONTRAST TECHNIQUE: Contiguous axial images were obtained from the base of the skull through the vertex without intravenous contrast. RADIATION DOSE REDUCTION: This exam was performed according to the departmental dose-optimization program which includes automated exposure control, adjustment of the mA and/or kV according to patient size and/or use of iterative reconstruction technique. COMPARISON:  None Available. FINDINGS: Brain: No evidence of acute infarction, hemorrhage, hydrocephalus, extra-axial collection or mass lesion/mass effect. Chronic white matter ischemic changes and atrophic changes are noted. Vascular: No hyperdense vessel or unexpected calcification. Skull: Normal. Negative for fracture or focal lesion. Sinuses/Orbits: No acute finding. Other: None. IMPRESSION: Chronic atrophic and ischemic changes without acute abnormality Electronically Signed   By: Alcide Clever M.D.   On: 07/10/2023 00:21   DG Chest 1 View  Result Date: 07/10/2023 CLINICAL DATA:  Fall EXAM: CHEST  1 VIEW COMPARISON:  02/28/2023 FINDINGS: Lungs are clear.  No pleural effusion or pneumothorax. The heart is normal in size.  Thoracic aortic atherosclerosis. IMPRESSION: No acute cardiopulmonary disease.  Electronically Signed   By: Charline Bills M.D.   On: 07/10/2023 00:15   DG Hip Unilat W or Wo Pelvis 2-3 Views Right  Result Date: 07/10/2023 CLINICAL DATA:  Fall, right hip pain EXAM: DG HIP (WITH OR WITHOUT PELVIS) 2-3V RIGHT COMPARISON:  None Available. FINDINGS: Mildly comminuted, intertrochanteric right hip fracture with varus angulation and foreshortening. Cortically based sclerosis along the medial femoral shafts bilaterally, of uncertain etiology but reflecting a smooth/benign appearance. Left hip joint space is preserved. Visualized bony pelvis appears intact. Large calcified pelvic fibroid. IMPRESSION: Intertrochanteric right hip fracture, as above. Electronically Signed   By: Charline Bills M.D.   On: 07/10/2023 00:14    Review of Systems  Constitutional:  Negative for chills and fever.  Eyes:  Positive for discharge.  Respiratory:  Negative for shortness of breath.   Cardiovascular:  Negative for chest pain, palpitations and leg swelling.  Gastrointestinal:  Negative for abdominal pain, diarrhea, nausea and vomiting.  Musculoskeletal:  Positive for arthralgias and gait problem.  Neurological:  Negative for dizziness and headaches.  Psychiatric/Behavioral:  Negative for confusion.    Blood pressure (!) 167/67, pulse 76, temperature 98.2 F (36.8 C), temperature source Oral, resp. rate 16, height 5\' 2"  (1.575 m), weight 52.6 kg, SpO2 100%. Physical Exam Constitutional:      General: She is not in acute distress.    Appearance: Normal appearance. She is not ill-appearing.  HENT:     Head: Normocephalic and atraumatic.  Eyes:  General:        Right eye: Discharge present.        Left eye: Discharge present.    Comments: EOM left eye intact. Right eye has been closed "for years" but appears somewhat matted with active discharge.   Cardiovascular:     Rate and Rhythm: Normal rate.     Pulses: Normal pulses.  Pulmonary:     Effort: Pulmonary effort is normal. No  respiratory distress.  Musculoskeletal:     Cervical back: Normal range of motion.     Comments: Tender to palpation about right hip. No open wounds or abrasions about RLE. Right LE shortened and externally rotated. Calves soft and nontender. Dorsiflexion and plantar flexion intact in BLE.   Neurological:     General: No focal deficit present.     Mental Status: She is alert. Mental status is at baseline.  Psychiatric:        Mood and Affect: Mood normal.     Assessment/Plan: Right hip intertrochanteric fracture Plan for OR later today for IM nail with Dr Ave Filter. Patient to be on bed rest, NWB. Patient should remain NPO. Patient will likely need SNF placement upon discharge. Discussed surgery and expected postoperative course, risks, and benefits with patient's granddaughter.    L. Porterfield, PA-C 07/10/2023, 8:19 AM

## 2023-07-10 NOTE — Discharge Instructions (Signed)
Discharge Instructions   Weightbearing as tolerated right leg Use ice on the hip intermittently.  Pain medicine has been prescribed for you.  Use your medicine liberally over the first 48 hours, and then you can begin to taper your use. You may take Extra Strength Tylenol or Tylenol only in place of the pain pills. DO NOT take ANY nonsteroidal anti-inflammatory pain medications: Advil, Motrin, Ibuprofen, Aleve, Naproxen or Naprosyn.  You may remove your dressing after five days  You may shower 5 days after surgery. The incisions CANNOT get wet prior to 5 days. Simply allow the water to wash over the site and then pat dry. Do not rub the incisions. Make sure your axilla (armpit) is completely dry after showering.  Resume aspirin 81mg  the day after surgery   Please call [phone] during normal business hours or [phone] after hours for any problems. Including the following:  - excessive redness of the incisions - drainage for more than 4 days - fever of more than 101.5 F  *Please note that pain medications will not be refilled after hours or on weekends.

## 2023-07-10 NOTE — H&P (Signed)
History and Physical      Chloe Wilcox GNF:[id number] DOB: 05/01/1935 DOA: 07/09/2023; DOS: 07/10/2023  PCP: Baldemar Lenis, MD  Patient coming from: home   I have personally briefly reviewed patient's old medical records in [facility]  Chief Complaint: right hip pain  HPI: Chloe Wilcox is a 88 y.o. female with medical history significant for COPD, essential hypertension, hyperlipidemia, anemia of chronic disease associated baseline hemoglobin 10-12, who is admitted to [facility] on 07/09/2023 with acute right intertrochanteric hip fracture after ground-level mechanical fall, after presenting from home to [facility] ED complaining of right hip pain.    Patient reports tripping while attempting to ambulate at home, resulting in a ground level fall during which right hip was the principal point of contact with the floor below. As a result of this fall, the patient reports immediate development of sharp right hip pain, with radiation into the right groin. States that this pain has been constant since onset, with exacerbation when attempting to move the right lower extremity.  As a consequence of the associated intensity of this discomfort, reports that he is unable to bear weight on the right lower extremity at this time.  This is relative to baseline functional status in which the patient lives independently as well as baseline ambulatory status in which the patient reports the ability to ambulate without assistance, including no need for support devices.  Otherwise, denies any acute arthralgias or myalgias as a result of the above fall.  Denies any acute numbness or paresthesias in bilateral lower extremities, and confirms that right hip representations a native joint.  Follows unwitnessed in nature.  Patient does not believe that she hit her head as a component of this fall, and denies any associated loss of consciousness.  Denies any preceding or associated chest pain, shortness of  breath, diaphoresis, palpitations, nausea, vomiting, dizziness, presyncope, or syncope.  Denies any subsequent headache, neck pain, blurry vision, or diplopia.  She conveys that she takes a daily baby aspirin at home, but otherwise on no additional blood thinners as an outpatient.  Denies any known history of coronary artery disease or CHF. denies any recent orthopnea, PND, or peripheral edema.     ED Course:  Vital signs in the ED were notable for the following: Afebrile; heart rates in the 70s; systolic blood pressures in the 150s; respiratory rate 16-18, oxygen saturation 100% on 2 L nasal cannula.  Labs were notable for the following: BMP notable for sodium 139, creatinine 0.55.  CBC notable for white blood cell count 7200, hemoglobin 9.8 associated with normocytic/recurrent properties as well as nonelevated RDW and relative demonstration prior hemoglobin data point of 10.2 in September 2022.  Per my interpretation, EKG in ED demonstrated the following: The interpretation of this EKG is limited by the presence of motion artifact.  However, within these confines, appears to show sinus rhythm with normal intervals, heart rate 73, nonspecific T wave inversions in V1, V2, of which T wave inversion in V1 appears unchanged from his recent prior EKG performed on 07/30/2021, will demonstrated no evidence of ST changes, including no evidence of ST elevation.  Imaging in the ED, per corresponding formal radiology read, was notable for the following: 1 view chest x-ray showed no evidence of acute cardiopulmonary process, including no evidence of infiltrate.  Plain films of the right hip showed evidence of acute right intertrochanteric hip fracture.  Noncontrast CT head showed no evidence of acute intracranial process, including  no evidence of acute infarct nor any evidence of intracranial hemorrhage.  EDP discussed patient's case with on-call [facility], Dr. Ave Filter, who conveyed that [facility] will formally consult and see patient in the AM, requesting that patient be kept NPO.   While in the ED, the following were administered: Fentanyl 50 mcg IV x 1 dose.  Subsequently, the patient was admitted for further evaluation and management of acute right intertrochanteric hip fracture stemming from ground-level mechanical fall at home.     Review of Systems: As per HPI otherwise 10 point review of systems negative.   Past Medical History:  Diagnosis Date   COPD (chronic obstructive pulmonary disease) (HCC)    Depression    Hypertension     Past Surgical History:  Procedure Laterality Date   VAGINAL HYSTERECTOMY      Social History:  reports that she quit smoking about 39 years ago. Her smoking use included cigarettes. She started smoking about 72 years ago. She has a 33 pack-year smoking history. She has never used smokeless tobacco. She reports that she does not drink alcohol and does not use drugs.   No Known Allergies  Family History  Family history unknown: Yes    Family history reviewed and not pertinent    Prior to Admission medications   Medication Sig Start Date End Date Taking? Authorizing Provider  acetaminophen (TYLENOL) 650 MG CR tablet Take 650 mg by mouth every 8 (eight) hours as needed for pain (or headaches).    Yes [provider]  albuterol (PROVENTIL) (2.5 MG/3ML) 0.083% nebulizer solution Take 3 mLs (2.5 mg total) by nebulization every 6 (six) hours as needed for wheezing or shortness of breath. 05/01/23  Yes Parrett, Tammy S, NP  albuterol (VENTOLIN HFA) 108 (90 Base) MCG/ACT inhaler Inhale 2 puffs into the lungs every 6 (six) hours as needed for wheezing or shortness of breath. 05/30/23  Yes Parrett, Tammy S, NP  amLODipine (NORVASC) 10 MG tablet Take 10 mg by mouth daily as needed (IF SYSTOLIC NUMBER IS AT LEAST 100 OR GREATER).  05/21/19  Yes [provider]  aspirin EC 81 MG tablet Take 81 mg by mouth daily. Swallow  whole.   Yes [provider]  atorvastatin (LIPITOR) 40 MG tablet Take 40 mg by mouth at bedtime.   Yes [provider]  Budeson-Glycopyrrol-Formoterol (BREZTRI AEROSPHERE) 160-9-4.8 MCG/ACT AERO Inhale 2 puffs into the lungs in the morning and at bedtime. 05/30/23  Yes Parrett, Tammy S, NP  citalopram (CELEXA) 20 MG tablet Take 20 mg by mouth daily.   Yes [provider]  diclofenac (VOLTAREN) 50 MG EC tablet Take 50 mg by mouth 2 (two) times daily.   Yes [provider]  furosemide (LASIX) 20 MG tablet Take 20-40 mg by mouth. Take 40 mg by mouth in the morning and 20 mg at bedtime 11/25/19  Yes [provider]  ipratropium-albuterol (DUONEB) 0.5-2.5 (3) MG/3ML SOLN Inhale 3 mLs into the lungs in the morning and at bedtime. 06/26/23  Yes [provider]  olmesartan (BENICAR) 20 MG tablet Take 10 mg by mouth at bedtime.   Yes [provider]  tiZANidine (ZANAFLEX) 2 MG tablet Take 2 mg by mouth every 8 (eight) hours as needed for muscle spasms.  12/02/19  Yes [provider]  traMADol (ULTRAM) 50 MG tablet Take 50 mg by mouth daily as needed for moderate pain.   Yes [provider]     Objective  Physical Exam: Vitals:   07/09/23 2325 07/09/23 2350 07/10/23 0119 07/10/23 0210  BP:  (!) 153/78 (!) 157/73 (!) 152/77  Pulse:  72 78 78  Resp:  16 18 18   Temp:      TempSrc:      SpO2:  100% 98% 100%  Weight: 52.6 kg     Height: 5\' 2"  (1.575 m)       General: appears to be stated age; alert, oriented Skin: warm, dry, no rash Head:  AT/[place] Mouth:  Oral mucosa membranes appear moist, normal dentition Neck: supple; trachea midline Heart:  RRR; did not appreciate any M/R/G Lungs: CTAB, did not appreciate any wheezes, rales, or rhonchi Abdomen: + BS; soft, ND, NT Vascular: 2+ pedal pulses b/l; 2+ radial pulses b/l Extremities: Right lower extremity appears externally rotated and slightly shorter than its left  counterpart ;no peripheral edema, no muscle wasting Neuro:  sensation intact in upper and lower extremities b/l; deferred evaluation of strength involving the right lower extremity in the context of presenting acute right hip fracture    Labs on Admission: I have personally reviewed following labs and imaging studies  CBC: Recent Labs  Lab 07/09/23 2320  WBC 7.2  NEUTROABS 5.5  HGB 9.8*  HCT 32.2*  MCV 92.3  PLT 219   Basic Metabolic Panel: Recent Labs  Lab 07/09/23 2320  NA 139  K 4.0  CL 98  CO2 34*  GLUCOSE 111*  BUN 12  CREATININE 0.55  CALCIUM 9.1   GFR: Estimated Creatinine Clearance: 39.9 mL/min (by C-G formula based on SCr of 0.55 mg/dL). Liver Function Tests: No results for input(s): "AST", "ALT", "ALKPHOS", "BILITOT", "PROT", "ALBUMIN" in the last 168 hours. No results for input(s): "LIPASE", "AMYLASE" in the last 168 hours. No results for input(s): "AMMONIA" in the last 168 hours. Coagulation Profile: No results for input(s): "INR", "PROTIME" in the last 168 hours. Cardiac Enzymes: No results for input(s): "CKTOTAL", "CKMB", "CKMBINDEX", "TROPONINI" in the last 168 hours. BNP (last 3 results) No results for input(s): "PROBNP" in the last 8760 hours. HbA1C: No results for input(s): "HGBA1C" in the last 72 hours. CBG: No results for input(s): "GLUCAP" in the last 168 hours. Lipid Profile: No results for input(s): "CHOL", "HDL", "LDLCALC", "TRIG", "CHOLHDL", "LDLDIRECT" in the last 72 hours. Thyroid Function Tests: No results for input(s): "TSH", "T4TOTAL", "FREET4", "T3FREE", "THYROIDAB" in the last 72 hours. Anemia Panel: No results for input(s): "VITAMINB12", "FOLATE", "FERRITIN", "TIBC", "IRON", "RETICCTPCT" in the last 72 hours. Urine analysis: No results found for: "COLORURINE", "APPEARANCEUR", "LABSPEC", "PHURINE", "GLUCOSEU", "HGBUR", "BILIRUBINUR", "KETONESUR", "PROTEINUR", "UROBILINOGEN", "NITRITE", "LEUKOCYTESUR"  Radiological Exams on  Admission: CT Head Wo Contrast  Result Date: 07/10/2023 CLINICAL DATA:  Recent fall with headaches, initial encounter EXAM: CT HEAD WITHOUT CONTRAST TECHNIQUE: Contiguous axial images were obtained from the base of the skull through the vertex without intravenous contrast. RADIATION DOSE REDUCTION: This exam was performed according to the departmental dose-optimization program which includes automated exposure control, adjustment of the mA and/or kV according to patient size and/or use of iterative reconstruction technique. COMPARISON:  None Available. FINDINGS: Brain: No evidence of acute infarction, hemorrhage, hydrocephalus, extra-axial collection or mass lesion/mass effect. Chronic white matter ischemic changes and atrophic changes are noted. Vascular: No hyperdense vessel or unexpected calcification. Skull: Normal. Negative for fracture or focal lesion. Sinuses/Orbits: No acute finding. Other: None. IMPRESSION: Chronic atrophic and ischemic changes without acute abnormality Electronically Signed   By: Alcide Clever M.D.   On: 07/10/2023 00:21  DG Chest 1 View  Result Date: 07/10/2023 CLINICAL DATA:  Fall EXAM: CHEST  1 VIEW COMPARISON:  02/28/2023 FINDINGS: Lungs are clear.  No pleural effusion or pneumothorax. The heart is normal in size.  Thoracic aortic atherosclerosis. IMPRESSION: No acute cardiopulmonary disease. Electronically Signed   By: Charline Bills M.D.   On: 07/10/2023 00:15   DG Hip Unilat W or Wo Pelvis 2-3 Views Right  Result Date: 07/10/2023 CLINICAL DATA:  Fall, right hip pain EXAM: DG HIP (WITH OR WITHOUT PELVIS) 2-3V RIGHT COMPARISON:  None Available. FINDINGS: Mildly comminuted, intertrochanteric right hip fracture with varus angulation and foreshortening. Cortically based sclerosis along the medial femoral shafts bilaterally, of uncertain etiology but reflecting a smooth/benign appearance. Left hip joint space is preserved. Visualized bony pelvis appears intact. Large  calcified pelvic fibroid. IMPRESSION: Intertrochanteric right hip fracture, as above. Electronically Signed   By: Charline Bills M.D.   On: 07/10/2023 00:14      Assessment/Plan    Principal Problem:   Closed right hip fracture (HCC) Active Problems:   COPD (chronic obstructive pulmonary disease) (HCC)   Essential hypertension   Fall at home, initial encounter   Depression   Hyperlipidemia   Anemia of chronic disease       #) Acute right intertrochanteric hip fracture: confirmed via presenting plain films and stemming from ground level mechanical fall without associated loss of consciousness that occurred earlier on the day of admission, as further described above, resulting in immediate develop of acute right hip pain.  At this time, the right lower extremity appears neurovascularly intact, and patient reports adequate pain control.  On a daily baby aspirin at home, but otherwise no additional blood thinners.   EDP discussed patient's case with on-call [facility], Dr. Ave Filter, who conveyed that [facility] will formally consult and see patient in the AM, requesting that patient be kept NPO.   Chales Abrahams Score for this patient in the context of anticipated aforementioned orthopedic surgery conveys a  0.92% perioperative risk for significant cardiac event. No evidence to suggest acutely decompensated heart failure or acute MI. Consequently, no absolute contraindications to proceeding with proposed orthopedic surgery at this time.  Plan: Formal orthopedic surgery consult for definitive surgical management, with plan to take patient to the OR on 07/10/23. NPO after MN in anticipation of this procedure.  No pharmacologic anticoagulation leading up to this anticipated surgery.  Hold home aspirin for now.  SCD's. Prn IV Dilaudid. Anticipate postoperative PT consult. Check INR.  Check 25-hydroxy vit D level to assess for any underlying pathological contribution towards the  patient's fracture stemming from associated vitamin deficiency. Type and screen ordered.  Incentive spirometry.              #) Ground level mechanical fall: The patient reports a ground level mechanical fall earlier today in which she tripped without any associated loss of consciousness. Appears to be purely mechanical in nature, without clinical evidence at this time to suggest contributory dizziness, presyncope, syncope, or acute ischemic CVA. Does not appear to have hit head as component of this fall. presenting CT head showed no evidence of acute intracranial process, including no evidence of intracranial hemorrhage, and presentation does not appear to be associated any acute neurologic deficits.  While this fall appears to be purely mechanical in nature, will also check urinalysis to evaluate for any underlying infectious contribution.   Plan: Check urinalysis, as above.  CMP and CBC with differential in the morning. Fall precautions.  Anticipate postoperative PT consult.              #) COPD: Documented history thereof, without clinical evidence of acute exacerbation at this time.  This is in the context of a history of being a former smoker.  Outpatient respiratory regimen includes the following: Scheduled Breztri as well as as needed albuterol inhaler.   Plan: cont outpatient Breztri. Prn albuterol nebulizer. Check CMP and serum magnesium level in the AM.  Incentive spirometry.                  #) Essential Hypertension: documented h/o such, with outpatient antihypertensive regimen including amlodipine, olmesartan, and Lasix.  SBP's in the ED today: 150s mmHg. as the patient appears euvolemic at this time, and as she is currently NPO leading up to anticipate orthopedic surgery, will hold home hypertensive medications for now.  Plan: Close monitoring of subsequent BP via routine VS. hold outpatient hypertensive medications for now, as above.  Monitor strict  I's and O's and daily weights.                   #) Depression: documented h/o such. On Celexa as outpatient.  Of note, no evidence of QTc prolongation on presenting EKG.  Plan: In the setting of current n.p.o. status, will hold home Celexa for now.                #) Hyperlipidemia: documented h/o such. On high intensity atorvastatin as outpatient.   Plan: In the setting of current n.p.o. status, will hold home statin for now.               #) Anemia of chronic disease: Documented history of such, a/w with baseline hgb range 10-12, with presenting hgb consistent with this range, in the absence of any overt evidence of active bleed.     Plan: Repeat CBC in the morning.  In the context of anticipated orthopedic surgery for presenting acute right hip fracture, will obtain type and screen at this time.  Check INR.         DVT prophylaxis: SCD's   Code Status: Full code Family Communication: none Disposition Plan: Per Rounding Team Consults called: EDP discussed patient's case with on-call [facility], Dr. Ave Filter, who conveyed that [facility] will formally consult and see patient in the AM (8/12);  Admission status: inpatient    I SPENT GREATER THAN 75  MINUTES IN CLINICAL CARE TIME/MEDICAL DECISION-MAKING IN COMPLETING THIS ADMISSION.     Chaney Born  DO [facility] From 7PM - 7AM   07/10/2023, 2:28 AM

## 2023-07-10 NOTE — Transfer of Care (Signed)
Immediate Anesthesia Transfer of Care Note  Patient: Chloe Wilcox  Procedure(s) Performed: INTRAMEDULLARY (IM) NAIL INTERTROCHANTERIC (Right: Hip)  Patient Location: PACU  Anesthesia Type:General  Level of Consciousness: drowsy and patient cooperative  Airway & Oxygen Therapy: Patient Spontanous Breathing and Patient connected to face mask oxygen  Post-op Assessment: Report given to RN and Post -op Vital signs reviewed and stable  Post vital signs: Reviewed and stable  Last Vitals:  Vitals Value Taken Time  BP 200/93 07/10/23 1738  Temp    Pulse 68 07/10/23 1739  Resp 15 07/10/23 1739  SpO2 100 % 07/10/23 1739  Vitals shown include unfiled device data.  Last Pain:  Vitals:   07/10/23 1416  TempSrc:   PainSc: 0-No pain         Complications: No notable events documented.

## 2023-07-10 NOTE — Anesthesia Preprocedure Evaluation (Signed)
Anesthesia Evaluation  Patient identified by MRN, date of birth, ID band Patient awake    Reviewed: Allergy & Precautions, NPO status , Patient's Chart, lab work & pertinent test results, reviewed documented beta blocker date and time   History of Anesthesia Complications Negative for: history of anesthetic complications  Airway Mallampati: III  TM Distance: >3 FB Neck ROM: Limited    Dental  (+) Poor Dentition   Pulmonary shortness of breath, COPD (severe),  COPD inhaler and oxygen dependent, former smoker   breath sounds clear to auscultation       Cardiovascular hypertension, (-) angina + DOE  (-) CAD, (-) Past MI, (-) CABG and (-) CHF (-) Valvular Problems/Murmurs Rhythm:Regular Rate:Normal     Neuro/Psych neg Seizures PSYCHIATRIC DISORDERS  Depression       GI/Hepatic ,neg GERD  ,,(+) neg Cirrhosis        Endo/Other    Renal/GU Renal disease     Musculoskeletal   Abdominal   Peds  Hematology   Anesthesia Other Findings   Reproductive/Obstetrics                              Anesthesia Physical Anesthesia Plan  ASA: 3  Anesthesia Plan: Spinal   Post-op Pain Management:    Induction: Intravenous  PONV Risk Score and Plan: 1 and Propofol infusion  Airway Management Planned:   Additional Equipment:   Intra-op Plan:   Post-operative Plan:   Informed Consent: I have reviewed the patients History and Physical, chart, labs and discussed the procedure including the risks, benefits and alternatives for the proposed anesthesia with the patient or authorized representative who has indicated his/her understanding and acceptance.     Dental advisory given  Plan Discussed with: CRNA  Anesthesia Plan Comments:          Anesthesia Quick Evaluation

## 2023-07-10 NOTE — Progress Notes (Signed)
[facility] PROGRESS NOTE    Progress Note  MAZEY Wilcox  BJY:[id number] DOB: [birth year] DOA: 07/09/2023 PCP: Baldemar Lenis, MD     Brief Narrative:   Chloe Wilcox is an 88 y.o. female past medical history of COPD, essential hypertension anemia of chronic disease with a baseline hemoglobin of 10-12 comes to [facility] long hospital for acute right intertrochanteric hip fracture after mechanical fall at ground-level.   Assessment/Plan:   Closed displaced intertrochanteric fracture of right femur [facility]) Orthopedic surgery was consulted scheduled for 07/10/2023. N.p.o. after midnight analgesics per orthopedic surgery. Started on MiraLAX PT OT consult postsurgical  Mechanical fall: CT of the head showed no acute findings. PT OT has been consulted.  COPD (chronic obstructive pulmonary disease) (HCC) Continue inhalers.  Essential hypertension Allow permissive hypertension. Agree with holding  Lasix and ACE inhibitor Can resume Norvasc.  Depression: Currently n.p.o. can resume Celexa when she is able to tolerate orals.  Hyperlipidemia: Resume statins once able to take orals.  Anemia of chronic r disease: INR 1.2 hemoglobin at baseline continue to monitor.    DVT prophylaxis: lovenox Family Communication:none Status is: Inpatient Remains inpatient appropriate because: Acute closed left hip fracture    Code Status:     Code Status Orders  (From admission, onward)           Start     Ordered   07/10/23 0208  Full code  Continuous       Question:  By:  Answer:  Consent: discussion documented in EHR   07/10/23 0207           Code Status History     Date Active Date Inactive Code Status Order ID Comments User Context   07/24/2020 0804 07/28/2020 0204 Partial Code [id number]  Alba Cory, MD ED   06/14/2019 1503 06/16/2019 1909 Full Code [id number]  Hughie Closs, MD Inpatient         IV Access:   Peripheral IV   Procedures and  diagnostic studies:   CT Head Wo Contrast  Result Date: 07/10/2023 CLINICAL DATA:  Recent fall with headaches, initial encounter EXAM: CT HEAD WITHOUT CONTRAST TECHNIQUE: Contiguous axial images were obtained from the base of the skull through the vertex without intravenous contrast. RADIATION DOSE REDUCTION: This exam was performed according to the departmental dose-optimization program which includes automated exposure control, adjustment of the mA and/or kV according to patient size and/or use of iterative reconstruction technique. COMPARISON:  None Available. FINDINGS: Brain: No evidence of acute infarction, hemorrhage, hydrocephalus, extra-axial collection or mass lesion/mass effect. Chronic white matter ischemic changes and atrophic changes are noted. Vascular: No hyperdense vessel or unexpected calcification. Skull: Normal. Negative for fracture or focal lesion. Sinuses/Orbits: No acute finding. Other: None. IMPRESSION: Chronic atrophic and ischemic changes without acute abnormality Electronically Signed   By: Alcide Clever M.D.   On: 07/10/2023 00:21   DG Chest 1 View  Result Date: 07/10/2023 CLINICAL DATA:  Fall EXAM: CHEST  1 VIEW COMPARISON:  02/28/2023 FINDINGS: Lungs are clear.  No pleural effusion or pneumothorax. The heart is normal in size.  Thoracic aortic atherosclerosis. IMPRESSION: No acute cardiopulmonary disease. Electronically Signed   By: Charline Bills M.D.   On: 07/10/2023 00:15   DG Hip Unilat W or Wo Pelvis 2-3 Views Right  Result Date: 07/10/2023 CLINICAL DATA:  Fall, right hip pain EXAM: DG HIP (WITH OR WITHOUT PELVIS) 2-3V RIGHT COMPARISON:  None Available. FINDINGS: Mildly comminuted, intertrochanteric right hip fracture with  varus angulation and foreshortening. Cortically based sclerosis along the medial femoral shafts bilaterally, of uncertain etiology but reflecting a smooth/benign appearance. Left hip joint space is preserved. Visualized bony pelvis appears intact.  Large calcified pelvic fibroid. IMPRESSION: Intertrochanteric right hip fracture, as above. Electronically Signed   By: Charline Bills M.D.   On: 07/10/2023 00:14     Medical Consultants:   None.   Subjective:    [name] relates left hip pain  Objective:    Vitals:   07/10/23 0400 07/10/23 0500 07/10/23 0600 07/10/23 0639  BP: (!) 144/69 (!) 155/75 (!) 157/71   Pulse: 70 72 74   Resp:  14 16   Temp:    98.1 F (36.7 C)  TempSrc:    Oral  SpO2: 99% 100% 100%   Weight:      Height:       SpO2: 100 % O2 Flow Rate (L/min): 2 L/min  No intake or output data in the 24 hours ending 07/10/23 0709 Filed Weights   07/09/23 2325  Weight: 52.6 kg    Exam: General exam: In no acute distress. Respiratory system: Good air movement and clear to auscultation. Cardiovascular system: S1 & S2 heard, RRR. No JVD. Gastrointestinal system: Abdomen is nondistended, soft and nontender.  Extremities: No pedal edema. Skin: No rashes, lesions or ulcers Psychiatry: Judgement and insight appear normal. Mood & affect appropriate.    Data Reviewed:    Labs: Basic Metabolic Panel: Recent Labs  Lab 07/09/23 2320 07/10/23 0445  NA 139 136  K 4.0 3.1*  CL 98 96*  CO2 34* 34*  GLUCOSE 111* 129*  BUN 12 10  CREATININE 0.55 0.46  CALCIUM 9.1 8.8*  MG  --  2.0   GFR Estimated Creatinine Clearance: 39.9 mL/min (by C-G formula based on SCr of 0.46 mg/dL). Liver Function Tests: Recent Labs  Lab 07/10/23 0445  AST 17  ALT 16  ALKPHOS 64  BILITOT 0.7  PROT 6.1*  ALBUMIN 3.9   No results for input(s): "LIPASE", "AMYLASE" in the last 168 hours. No results for input(s): "AMMONIA" in the last 168 hours. Coagulation profile Recent Labs  Lab 07/10/23 0445  INR 1.2   COVID-19 Labs  No results for input(s): "DDIMER", "FERRITIN", "LDH", "CRP" in the last 72 hours.  Lab Results  Component Value Date   SARSCOV2NAA NEGATIVE 07/29/2021   SARSCOV2NAA POSITIVE (A)  09/03/2020   SARSCOV2NAA NEGATIVE 07/23/2020   SARSCOV2NAA Not Detected 10/14/2019    CBC: Recent Labs  Lab 07/09/23 2320 07/10/23 0445  WBC 7.2 11.1*  NEUTROABS 5.5 9.6*  HGB 9.8* 9.1*  HCT 32.2* 29.7*  MCV 92.3 93.1  PLT 219 198   Cardiac Enzymes: No results for input(s): "CKTOTAL", "CKMB", "CKMBINDEX", "TROPONINI" in the last 168 hours. BNP (last 3 results) No results for input(s): "PROBNP" in the last 8760 hours. CBG: No results for input(s): "GLUCAP" in the last 168 hours. D-Dimer: No results for input(s): "DDIMER" in the last 72 hours. Hgb A1c: No results for input(s): "HGBA1C" in the last 72 hours. Lipid Profile: No results for input(s): "CHOL", "HDL", "LDLCALC", "TRIG", "CHOLHDL", "LDLDIRECT" in the last 72 hours. Thyroid function studies: No results for input(s): "TSH", "T4TOTAL", "T3FREE", "THYROIDAB" in the last 72 hours.  Invalid input(s): "FREET3" Anemia work up: No results for input(s): "VITAMINB12", "FOLATE", "FERRITIN", "TIBC", "IRON", "RETICCTPCT" in the last 72 hours. Sepsis Labs: Recent Labs  Lab 07/09/23 2320 07/10/23 0445  WBC 7.2 11.1*   Microbiology No results  found for this or any previous visit (from the past 240 hour(s)).   Medications:    fluticasone furoate-vilanterol  1 puff Inhalation Daily   And   umeclidinium bromide  1 puff Inhalation Daily   Continuous Infusions:    LOS: 0 days   Marinda Elk  [facility]  07/10/2023, 7:09 AM

## 2023-07-10 NOTE — Op Note (Signed)
Procedure(s): INTRAMEDULLARY (IM) NAIL INTERTROCHANTERIC Procedure Note  Chloe Wilcox female 88 y.o. 07/10/2023  Preoperative diagnosis: Right hip intertrochanteric proximal femur fracture  Postoperative diagnosis: Same  Procedure(s) and Anesthesia Type:    * INTRAMEDULLARY (IM) NAIL INTERTROCHANTERIC - Choice  Surgeons and Role:    Jones Broom, MD - Primary   Indications:  88 y.o. female s/p fall with right hip fracture. Indicated for surgery to promote early ambulation, pain control and prevent complications of bed rest.     Surgeon: Glennon Hamilton   Assistants: Fredia Sorrow PA-C [name] was present and scrubbed throughout the procedure and was essential in positioning, retraction, exposure, and closure)  Anesthesia: General LMA anesthesia    Procedure Detail  INTRAMEDULLARY (IM) NAIL INTERTROCHANTERIC  Findings: Anatomic alignment with Biomet short 9 mm affixes nail with 1 distal interlocking screw  Estimated Blood Loss:  200 mL         Drains: none  Blood Given: none          Specimens: none        Complications:  * No complications entered in OR log *         Disposition: PACU - hemodynamically stable.         Condition: stable    Procedure:  The patient was identified in the preoperative holding area  where I personally marked the operative site after verifying site, side,  and procedure with the patient. She was taken back to the operating  room where general anesthesia was induced without complication. She was  placed on the Hana aspirin table with the right lower extremity in traction,  and opposite lower extremity in a flexed abducted position. The arms were well  padded. Fluoroscopic imaging was used to verify reduction with gentle traction  and internal rotation. The right hip was then prepped and draped in the standard sterile fashion. An approximately 3 cm incision was made proximal  to the palpable greater trochanter tip.  Dissection was carried down to  the tip and the short guidewire was placed under fluoroscopic imaging.  The proximal entry reamer was used to open the canal and the nail was then advanced without difficulty and the proximal jig was  then placed and a small 1.5 cm incision was made on the lateral thigh to  advance the lag screw guide against the lateral aspect of the femur.  The guide pin was advanced and its position was verified in AP and  lateral planes to be centered in the head.  The guidewire was over  reamed and the appropriate size lag screw was advanced. The  proximal set screw was then advanced, backed off a quarter turn to allow  sliding. AP and lateral imaging demonstrated appropriate position of  the screw and reduction of the fracture. The proximal jig was then  Again used for the distal interlocking screw which was placed in the static position. Final fluoroscopic imaging in AP and lateral planes at the hip and the  knee demonstrated near anatomic reduction with appropriate length and  position of the hardware. All wounds were then copiously irrigated with  normal saline and subsequently closed in layers with #1 Vicryl in a deep  fascia layer, 2-0 Vicryl in a deep dermal layer, and staples for skin  closure. Sterile dressings were then applied including 4x4s and Mepilex  dressings. The patient was then taken off the bed, transferred to the stretcher, and taken to the recovery room in stable  condition  after she was extubated.   POSTOPERATIVE PLAN: She will be weightbearing as tolerated on the operative extremity. She will have DVT prophylaxis of aspirin, SCDs and early ambulation

## 2023-07-10 NOTE — Progress Notes (Signed)
PT Cancellation Note  Patient Details Name: Chloe Wilcox MRN: [id number] DOB: 1935-09-11   Cancelled Treatment:    Reason Eval/Treat Not Completed: Patient not medically ready. Pt is currently pending surgical intervention s/p R femur fx with MD note indicating "PT/OT post surgical'.  PT to hold at this time and follow up s/p surgical intervention and continue to follow acutely.   [name], PT Acute Rehab   [name] 07/10/2023, 9:32 AM

## 2023-07-10 NOTE — Anesthesia Postprocedure Evaluation (Signed)
Anesthesia Post Note  Patient: Chloe Wilcox  Procedure(s) Performed: INTRAMEDULLARY (IM) NAIL INTERTROCHANTERIC (Right: Hip)     Patient location during evaluation: PACU Anesthesia Type: Spinal Level of consciousness: awake and alert Pain management: pain level controlled Vital Signs Assessment: post-procedure vital signs reviewed and stable Respiratory status: spontaneous breathing, nonlabored ventilation, respiratory function stable and patient connected to nasal cannula oxygen Cardiovascular status: blood pressure returned to baseline and stable Postop Assessment: no apparent nausea or vomiting Anesthetic complications: no   No notable events documented.  Last Vitals:  Vitals:   07/10/23 1815 07/10/23 1830  BP: (!) 185/75 (!) 179/63  Pulse: 85 76  Resp: 13 13  Temp:  36.8 C  SpO2: (!) 88% 95%    Last Pain:  Vitals:   07/10/23 1830  TempSrc:   PainSc: Asleep    LLE Motor Response: Purposeful movement (07/10/23 1830) LLE Sensation: Full sensation (07/10/23 1830) RLE Motor Response: Purposeful movement (07/10/23 1830) RLE Sensation: Full sensation;Pain (07/10/23 1830)      Mariann Barter

## 2023-07-10 NOTE — Interval H&P Note (Signed)
History and Physical Interval Note:  07/10/2023 4:09 PM  Chloe Wilcox  has presented today for surgery, with the diagnosis of RIGHT INTEROCHANTERIC HIP FRACTURE.  The various methods of treatment have been discussed with the patient and family. After consideration of risks, benefits and other options for treatment, the patient has consented to  Procedure(s): INTRAMEDULLARY (IM) NAIL INTERTROCHANTERIC (Right) as a surgical intervention.  The patient's history has been reviewed, patient examined, no change in status, stable for surgery.  I have reviewed the patient's chart and labs.  Questions were answered to the patient's satisfaction.     Glennon Hamilton

## 2023-07-10 NOTE — Progress Notes (Signed)
OT Cancellation Note  Patient Details Name: Chloe Wilcox MRN: [id number] DOB: 16-Aug-1935   Cancelled Treatment:    Reason Eval/Treat Not Completed: Patient not medically ready Patient is currently pending surgery with MD note indicating "PT/OT post surgical" see after surgery intervention. OT to hold and follow up after surgical intervention.  Rosalio Loud, MS Acute Rehabilitation Department Office# [phone]  07/10/2023, 7:39 AM

## 2023-07-10 NOTE — H&P (View-Only) (Signed)
Reason for Consult:right hip fracture Referring Physician: Sallyanne Wilcox is an 88 y.o. female.  HPI: Chloe Wilcox is a 88 y.o. female with medical history significant for COPD, essential hypertension, hyperlipidemia, anemia of chronic disease associated baseline hemoglobin 10-12, who is admitted to [facility] on 07/09/2023 with acute right intertrochanteric hip fracture after ground-level mechanical fall, after presenting from home to [facility] ED complaining of right hip pain. Orthopedics consulted.    Past Medical History:  Diagnosis Date   COPD (chronic obstructive pulmonary disease) (HCC)    Depression    Hypertension     Past Surgical History:  Procedure Laterality Date   VAGINAL HYSTERECTOMY      Family History  Family history unknown: Yes    Social History:  reports that she quit smoking about 39 years ago. Her smoking use included cigarettes. She started smoking about 72 years ago. She has a 33 pack-year smoking history. She has never used smokeless tobacco. She reports that she does not drink alcohol and does not use drugs.  Allergies: No Known Allergies  Medications: I have reviewed the patient's current medications.  Results for orders placed or performed during the hospital encounter of 07/09/23 (from the past 48 hour(s))  CBC with Differential     Status: Abnormal   Collection Time: 07/09/23 11:20 PM  Result Value Ref Range   WBC 7.2 4.0 - 10.5 K/uL   RBC 3.49 (L) 3.87 - 5.11 MIL/uL   Hemoglobin 9.8 (L) 12.0 - 15.0 g/dL   HCT 84.1 (L) 32.4 - 40.1 %   MCV 92.3 80.0 - 100.0 fL   MCH 28.1 26.0 - 34.0 pg   MCHC 30.4 30.0 - 36.0 g/dL   RDW 02.7 25.3 - 66.4 %   Platelets 219 150 - 400 K/uL   nRBC 0.0 0.0 - 0.2 %   Neutrophils Relative % 76 %   Neutro Abs 5.5 1.7 - 7.7 K/uL   Lymphocytes Relative 10 %   Lymphs Abs 0.7 0.7 - 4.0 K/uL   Monocytes Relative 6 %   Monocytes Absolute 0.4 0.1 - 1.0 K/uL   Eosinophils Relative 6 %   Eosinophils Absolute 0.5  0.0 - 0.5 K/uL   Basophils Relative 1 %   Basophils Absolute 0.1 0.0 - 0.1 K/uL   Immature Granulocytes 1 %   Abs Immature Granulocytes 0.04 0.00 - 0.07 K/uL    Comment: Performed at [facility], [address], [place], [place] [address]  Basic metabolic panel     Status: Abnormal   Collection Time: 07/09/23 11:20 PM  Result Value Ref Range   Sodium 139 135 - 145 mmol/L   Potassium 4.0 3.5 - 5.1 mmol/L   Chloride 98 98 - 111 mmol/L   CO2 34 (H) 22 - 32 mmol/L   Glucose, Bld 111 (H) 70 - 99 mg/dL    Comment: Glucose reference range applies only to samples taken after fasting for at least 8 hours.   BUN 12 8 - 23 mg/dL   Creatinine, Ser 4.25 0.44 - 1.00 mg/dL   Calcium 9.1 8.9 - 95.6 mg/dL   GFR, Estimated >38 >75 mL/min    Comment: (NOTE) Calculated using the CKD-EPI Creatinine Equation (2021)    Anion gap 7 5 - 15    Comment: Performed at [facility], [address] [place], [place] [address]  CBC with Differential/Platelet     Status: Abnormal   Collection Time: 07/10/23  4:45  AM  Result Value Ref Range   WBC 11.1 (H) 4.0 - 10.5 K/uL   RBC 3.19 (L) 3.87 - 5.11 MIL/uL   Hemoglobin 9.1 (L) 12.0 - 15.0 g/dL   HCT 24.4 (L) 01.0 - 27.2 %   MCV 93.1 80.0 - 100.0 fL   MCH 28.5 26.0 - 34.0 pg   MCHC 30.6 30.0 - 36.0 g/dL   RDW 53.6 64.4 - 03.4 %   Platelets 198 150 - 400 K/uL   nRBC 0.0 0.0 - 0.2 %   Neutrophils Relative % 86 %   Neutro Abs 9.6 (H) 1.7 - 7.7 K/uL   Lymphocytes Relative 6 %   Lymphs Abs 0.6 (L) 0.7 - 4.0 K/uL   Monocytes Relative 6 %   Monocytes Absolute 0.6 0.1 - 1.0 K/uL   Eosinophils Relative 1 %   Eosinophils Absolute 0.1 0.0 - 0.5 K/uL   Basophils Relative 0 %   Basophils Absolute 0.0 0.0 - 0.1 K/uL   Immature Granulocytes 1 %   Abs Immature Granulocytes 0.05 0.00 - 0.07 K/uL    Comment: Performed at [facility], [address], [place], [place] [address]  Comprehensive metabolic panel      Status: Abnormal   Collection Time: 07/10/23  4:45 AM  Result Value Ref Range   Sodium 136 135 - 145 mmol/L   Potassium 3.1 (L) 3.5 - 5.1 mmol/L   Chloride 96 (L) 98 - 111 mmol/L   CO2 34 (H) 22 - 32 mmol/L   Glucose, Bld 129 (H) 70 - 99 mg/dL    Comment: Glucose reference range applies only to samples taken after fasting for at least 8 hours.   BUN 10 8 - 23 mg/dL   Creatinine, Ser 5.63 0.44 - 1.00 mg/dL   Calcium 8.8 (L) 8.9 - 10.3 mg/dL   Total Protein 6.1 (L) 6.5 - 8.1 g/dL   Albumin 3.9 3.5 - 5.0 g/dL   AST 17 15 - 41 U/L   ALT 16 0 - 44 U/L   Alkaline Phosphatase 64 38 - 126 U/L   Total Bilirubin 0.7 0.3 - 1.2 mg/dL   GFR, Estimated >87 >56 mL/min    Comment: (NOTE) Calculated using the CKD-EPI Creatinine Equation (2021)    Anion gap 6 5 - 15    Comment: Performed at [facility], [address] [place], [place] [address]  Magnesium     Status: None   Collection Time: 07/10/23  4:45 AM  Result Value Ref Range   Magnesium 2.0 1.7 - 2.4 mg/dL    Comment: Performed at [facility], [address] [place], [place] [address]  Protime-INR     Status: Abnormal   Collection Time: 07/10/23  4:45 AM  Result Value Ref Range   Prothrombin Time 15.3 (H) 11.4 - 15.2 seconds   INR 1.2 0.8 - 1.2    Comment: (NOTE) INR goal varies based on device and disease states. Performed at [facility], [address], [place], [place] [address]   Type and screen [facility]     Status: None (Preliminary result)   Collection Time: 07/10/23  4:45 AM  Result Value Ref Range   ABO/RH(D) PENDING    Antibody Screen PENDING    Sample Expiration      07/13/2023,2359 Performed at [facility], [address], [place], [place] [address]   Urinalysis, Complete w Microscopic -Urine, Clean Catch  Status: None   Collection Time: 07/10/23  7:44 AM  Result Value Ref Range   Color, Urine YELLOW YELLOW    APPearance CLEAR CLEAR   Specific Gravity, Urine 1.014 1.005 - 1.030   pH 7.0 5.0 - 8.0   Glucose, UA NEGATIVE NEGATIVE mg/dL   Hgb urine dipstick NEGATIVE NEGATIVE   Bilirubin Urine NEGATIVE NEGATIVE   Ketones, ur NEGATIVE NEGATIVE mg/dL   Protein, ur NEGATIVE NEGATIVE mg/dL   Nitrite NEGATIVE NEGATIVE   Leukocytes,Ua NEGATIVE NEGATIVE   RBC / HPF 0-5 0 - 5 RBC/hpf   WBC, UA 0-5 0 - 5 WBC/hpf   Bacteria, UA NONE SEEN NONE SEEN   Squamous Epithelial / HPF 0-5 0 - 5 /HPF    Comment: Performed at [facility], [address], [place], [place] [address]    CT Head Wo Contrast  Result Date: 07/10/2023 CLINICAL DATA:  Recent fall with headaches, initial encounter EXAM: CT HEAD WITHOUT CONTRAST TECHNIQUE: Contiguous axial images were obtained from the base of the skull through the vertex without intravenous contrast. RADIATION DOSE REDUCTION: This exam was performed according to the departmental dose-optimization program which includes automated exposure control, adjustment of the mA and/or kV according to patient size and/or use of iterative reconstruction technique. COMPARISON:  None Available. FINDINGS: Brain: No evidence of acute infarction, hemorrhage, hydrocephalus, extra-axial collection or mass lesion/mass effect. Chronic white matter ischemic changes and atrophic changes are noted. Vascular: No hyperdense vessel or unexpected calcification. Skull: Normal. Negative for fracture or focal lesion. Sinuses/Orbits: No acute finding. Other: None. IMPRESSION: Chronic atrophic and ischemic changes without acute abnormality Electronically Signed   By: Alcide Clever M.D.   On: 07/10/2023 00:21   DG Chest 1 View  Result Date: 07/10/2023 CLINICAL DATA:  Fall EXAM: CHEST  1 VIEW COMPARISON:  02/28/2023 FINDINGS: Lungs are clear.  No pleural effusion or pneumothorax. The heart is normal in size.  Thoracic aortic atherosclerosis. IMPRESSION: No acute cardiopulmonary disease.  Electronically Signed   By: Charline Bills M.D.   On: 07/10/2023 00:15   DG Hip Unilat W or Wo Pelvis 2-3 Views Right  Result Date: 07/10/2023 CLINICAL DATA:  Fall, right hip pain EXAM: DG HIP (WITH OR WITHOUT PELVIS) 2-3V RIGHT COMPARISON:  None Available. FINDINGS: Mildly comminuted, intertrochanteric right hip fracture with varus angulation and foreshortening. Cortically based sclerosis along the medial femoral shafts bilaterally, of uncertain etiology but reflecting a smooth/benign appearance. Left hip joint space is preserved. Visualized bony pelvis appears intact. Large calcified pelvic fibroid. IMPRESSION: Intertrochanteric right hip fracture, as above. Electronically Signed   By: Charline Bills M.D.   On: 07/10/2023 00:14    Review of Systems  Constitutional:  Negative for chills and fever.  Eyes:  Positive for discharge.  Respiratory:  Negative for shortness of breath.   Cardiovascular:  Negative for chest pain, palpitations and leg swelling.  Gastrointestinal:  Negative for abdominal pain, diarrhea, nausea and vomiting.  Musculoskeletal:  Positive for arthralgias and gait problem.  Neurological:  Negative for dizziness and headaches.  Psychiatric/Behavioral:  Negative for confusion.    Blood pressure (!) 167/67, pulse 76, temperature 98.2 F (36.8 C), temperature source Oral, resp. rate 16, height 5\' 2"  (1.575 m), weight 52.6 kg, SpO2 100%. Physical Exam Constitutional:      General: She is not in acute distress.    Appearance: Normal appearance. She is not ill-appearing.  HENT:     Head: Normocephalic and atraumatic.  Eyes:  General:        Right eye: Discharge present.        Left eye: Discharge present.    Comments: EOM left eye intact. Right eye has been closed "for years" but appears somewhat matted with active discharge.   Cardiovascular:     Rate and Rhythm: Normal rate.     Pulses: Normal pulses.  Pulmonary:     Effort: Pulmonary effort is normal. No  respiratory distress.  Musculoskeletal:     Cervical back: Normal range of motion.     Comments: Tender to palpation about right hip. No open wounds or abrasions about RLE. Right LE shortened and externally rotated. Calves soft and nontender. Dorsiflexion and plantar flexion intact in BLE.   Neurological:     General: No focal deficit present.     Mental Status: She is alert. Mental status is at baseline.  Psychiatric:        Mood and Affect: Mood normal.     Assessment/Plan: Right hip intertrochanteric fracture Plan for OR later today for IM nail with Dr Ave Filter. Patient to be on bed rest, NWB. Patient should remain NPO. Patient will likely need SNF placement upon discharge. Discussed surgery and expected postoperative course, risks, and benefits with patient's granddaughter.   Evelette Hollern L. [place], PA-C 07/10/2023, 8:19 AM

## 2023-07-10 NOTE — ED Notes (Signed)
ED TO INPATIENT HANDOFF REPORT  ED Nurse Name and Phone #: Suann Larry Name/Age/Gender Chloe Wilcox 88 y.o. female Room/Bed: WA16/WA16  Code Status   Code Status: Full Code  Home/SNF/Other Home Patient oriented to: self, place, time, and situation Is this baseline? Yes   Triage Complete: Triage complete  Chief Complaint Closed right hip fracture (HCC) [S72.001A]  Triage Note BIBA - per EMS family witnessed pt fall after losing her balance while ambulating with her cane. C/o right hip pain radiating to right knee. Denies LOC. Not on a blood thinner.    Allergies No Known Allergies  Level of Care/Admitting Diagnosis ED Disposition     ED Disposition  Admit   Condition  --   Comment  [facility] [[id number]]  Level of Care: Med-Surg [16]  May admit patient to [facility] or [facility] if equivalent level of care is available:: No  Covid Evaluation: Asymptomatic - no recent exposure (last 10 days) testing not required  Diagnosis: Closed right hip fracture [facility]) [[id number]]  Admitting Physician: Angie Fava [[id number]]  Attending Physician: Angie Fava [phone]  Certification:: I certify this patient will need inpatient services for at least 2 midnights  Estimated Length of Stay: 2          B Medical/Surgery History Past Medical History:  Diagnosis Date   COPD (chronic obstructive pulmonary disease) (HCC)    Depression    Hypertension    Past Surgical History:  Procedure Laterality Date   VAGINAL HYSTERECTOMY       A IV Location/Drains/Wounds Patient Lines/Drains/Airways Status     Active Line/Drains/Airways     Name Placement date Placement time Site Days   Peripheral IV 07/09/23 20 G 1" Anterior;Proximal;Right Forearm 07/09/23  2345  Forearm  1   External Urinary Catheter 07/10/23  0430  --  less than 1            Intake/Output Last 24 hours No intake or output data in the 24 hours ending 07/10/23  0730  Labs/Imaging Results for orders placed or performed during the hospital encounter of 07/09/23 (from the past 48 hour(s))  CBC with Differential     Status: Abnormal   Collection Time: 07/09/23 11:20 PM  Result Value Ref Range   WBC 7.2 4.0 - 10.5 K/uL   RBC 3.49 (L) 3.87 - 5.11 MIL/uL   Hemoglobin 9.8 (L) 12.0 - 15.0 g/dL   HCT 95.6 (L) 38.7 - 56.4 %   MCV 92.3 80.0 - 100.0 fL   MCH 28.1 26.0 - 34.0 pg   MCHC 30.4 30.0 - 36.0 g/dL   RDW 33.2 95.1 - 88.4 %   Platelets 219 150 - 400 K/uL   nRBC 0.0 0.0 - 0.2 %   Neutrophils Relative % 76 %   Neutro Abs 5.5 1.7 - 7.7 K/uL   Lymphocytes Relative 10 %   Lymphs Abs 0.7 0.7 - 4.0 K/uL   Monocytes Relative 6 %   Monocytes Absolute 0.4 0.1 - 1.0 K/uL   Eosinophils Relative 6 %   Eosinophils Absolute 0.5 0.0 - 0.5 K/uL   Basophils Relative 1 %   Basophils Absolute 0.1 0.0 - 0.1 K/uL   Immature Granulocytes 1 %   Abs Immature Granulocytes 0.04 0.00 - 0.07 K/uL    Comment: Performed at [facility], [address], [place], [place] [address]  Basic metabolic panel     Status: Abnormal   Collection Time:  07/09/23 11:20 PM  Result Value Ref Range   Sodium 139 135 - 145 mmol/L   Potassium 4.0 3.5 - 5.1 mmol/L   Chloride 98 98 - 111 mmol/L   CO2 34 (H) 22 - 32 mmol/L   Glucose, Bld 111 (H) 70 - 99 mg/dL    Comment: Glucose reference range applies only to samples taken after fasting for at least 8 hours.   BUN 12 8 - 23 mg/dL   Creatinine, Ser 1.61 0.44 - 1.00 mg/dL   Calcium 9.1 8.9 - 09.6 mg/dL   GFR, Estimated >04 >54 mL/min    Comment: (NOTE) Calculated using the CKD-EPI Creatinine Equation (2021)    Anion gap 7 5 - 15    Comment: Performed at [facility], [address] [place], [place] [address]  CBC with Differential/Platelet     Status: Abnormal   Collection Time: 07/10/23  4:45 AM  Result Value Ref Range   WBC 11.1 (H) 4.0 - 10.5 K/uL   RBC 3.19 (L) 3.87 - 5.11 MIL/uL    Hemoglobin 9.1 (L) 12.0 - 15.0 g/dL   HCT 91.4 (L) 78.2 - 95.6 %   MCV 93.1 80.0 - 100.0 fL   MCH 28.5 26.0 - 34.0 pg   MCHC 30.6 30.0 - 36.0 g/dL   RDW 21.3 08.6 - 57.8 %   Platelets 198 150 - 400 K/uL   nRBC 0.0 0.0 - 0.2 %   Neutrophils Relative % 86 %   Neutro Abs 9.6 (H) 1.7 - 7.7 K/uL   Lymphocytes Relative 6 %   Lymphs Abs 0.6 (L) 0.7 - 4.0 K/uL   Monocytes Relative 6 %   Monocytes Absolute 0.6 0.1 - 1.0 K/uL   Eosinophils Relative 1 %   Eosinophils Absolute 0.1 0.0 - 0.5 K/uL   Basophils Relative 0 %   Basophils Absolute 0.0 0.0 - 0.1 K/uL   Immature Granulocytes 1 %   Abs Immature Granulocytes 0.05 0.00 - 0.07 K/uL    Comment: Performed at [facility], [address] [place], [place] [address]  Comprehensive metabolic panel     Status: Abnormal   Collection Time: 07/10/23  4:45 AM  Result Value Ref Range   Sodium 136 135 - 145 mmol/L   Potassium 3.1 (L) 3.5 - 5.1 mmol/L   Chloride 96 (L) 98 - 111 mmol/L   CO2 34 (H) 22 - 32 mmol/L   Glucose, Bld 129 (H) 70 - 99 mg/dL    Comment: Glucose reference range applies only to samples taken after fasting for at least 8 hours.   BUN 10 8 - 23 mg/dL   Creatinine, Ser 9.52 0.44 - 1.00 mg/dL   Calcium 8.8 (L) 8.9 - 10.3 mg/dL   Total Protein 6.1 (L) 6.5 - 8.1 g/dL   Albumin 3.9 3.5 - 5.0 g/dL   AST 17 15 - 41 U/L   ALT 16 0 - 44 U/L   Alkaline Phosphatase 64 38 - 126 U/L   Total Bilirubin 0.7 0.3 - 1.2 mg/dL   GFR, Estimated >84 >13 mL/min    Comment: (NOTE) Calculated using the CKD-EPI Creatinine Equation (2021)    Anion gap 6 5 - 15    Comment: Performed at [facility], [address] [place], [place] [address]  Magnesium     Status: None   Collection Time: 07/10/23  4:45 AM  Result Value Ref Range   Magnesium 2.0 1.7 - 2.4 mg/dL  Comment: Performed at [facility], [address], [place], [place] [address]  Protime-INR     Status: Abnormal   Collection  Time: 07/10/23  4:45 AM  Result Value Ref Range   Prothrombin Time 15.3 (H) 11.4 - 15.2 seconds   INR 1.2 0.8 - 1.2    Comment: (NOTE) INR goal varies based on device and disease states. Performed at [facility], [address], [place], [place] [address]    CT Head Wo Contrast  Result Date: 07/10/2023 CLINICAL DATA:  Recent fall with headaches, initial encounter EXAM: CT HEAD WITHOUT CONTRAST TECHNIQUE: Contiguous axial images were obtained from the base of the skull through the vertex without intravenous contrast. RADIATION DOSE REDUCTION: This exam was performed according to the departmental dose-optimization program which includes automated exposure control, adjustment of the mA and/or kV according to patient size and/or use of iterative reconstruction technique. COMPARISON:  None Available. FINDINGS: Brain: No evidence of acute infarction, hemorrhage, hydrocephalus, extra-axial collection or mass lesion/mass effect. Chronic white matter ischemic changes and atrophic changes are noted. Vascular: No hyperdense vessel or unexpected calcification. Skull: Normal. Negative for fracture or focal lesion. Sinuses/Orbits: No acute finding. Other: None. IMPRESSION: Chronic atrophic and ischemic changes without acute abnormality Electronically Signed   By: Alcide Clever M.D.   On: 07/10/2023 00:21   DG Chest 1 View  Result Date: 07/10/2023 CLINICAL DATA:  Fall EXAM: CHEST  1 VIEW COMPARISON:  02/28/2023 FINDINGS: Lungs are clear.  No pleural effusion or pneumothorax. The heart is normal in size.  Thoracic aortic atherosclerosis. IMPRESSION: No acute cardiopulmonary disease. Electronically Signed   By: Charline Bills M.D.   On: 07/10/2023 00:15   DG Hip Unilat W or Wo Pelvis 2-3 Views Right  Result Date: 07/10/2023 CLINICAL DATA:  Fall, right hip pain EXAM: DG HIP (WITH OR WITHOUT PELVIS) 2-3V RIGHT COMPARISON:  None Available. FINDINGS: Mildly comminuted, intertrochanteric right  hip fracture with varus angulation and foreshortening. Cortically based sclerosis along the medial femoral shafts bilaterally, of uncertain etiology but reflecting a smooth/benign appearance. Left hip joint space is preserved. Visualized bony pelvis appears intact. Large calcified pelvic fibroid. IMPRESSION: Intertrochanteric right hip fracture, as above. Electronically Signed   By: Charline Bills M.D.   On: 07/10/2023 00:14    Pending Labs Unresulted Labs (From admission, onward)     Start     Ordered   07/10/23 0224  Urinalysis, Complete w Microscopic -Urine, Clean Catch  Once,   R       Question:  Specimen Source  Answer:  Urine, Clean Catch   07/10/23 0223   07/10/23 0224  Type and screen [facility]  Once,   R       Comments: [facility]    07/10/23 0223   07/10/23 0224  VITAMIN D 25 Hydroxy (Vit-D Deficiency, Fractures)  Add-on,   AD        07/10/23 0223            Vitals/Pain Today's Vitals   07/10/23 0400 07/10/23 0500 07/10/23 0600 07/10/23 0639  BP: (!) 144/69 (!) 155/75 (!) 157/71   Pulse: 70 72 74   Resp:  14 16   Temp:    98.1 F (36.7 C)  TempSrc:    Oral  SpO2: 99% 100% 100%   Weight:      Height:      PainSc:        Isolation Precautions No active  isolations  Medications Medications  HYDROmorphone (DILAUDID) injection 0.5 mg (0.5 mg Intravenous Given 07/10/23 0213)  acetaminophen (TYLENOL) tablet 650 mg (has no administration in time range)    Or  acetaminophen (TYLENOL) suppository 650 mg (has no administration in time range)  ondansetron (ZOFRAN) injection 4 mg (has no administration in time range)  albuterol (PROVENTIL) (2.5 MG/3ML) 0.083% nebulizer solution 2.5 mg (has no administration in time range)  fluticasone furoate-vilanterol (BREO ELLIPTA) 200-25 MCG/ACT 1 puff (has no administration in time range)    And  umeclidinium bromide (INCRUSE ELLIPTA) 62.5 MCG/ACT 1 puff (has no administration in time  range)  polyethylene glycol (MIRALAX / GLYCOLAX) packet 17 g (has no administration in time range)  0.9 %  sodium chloride infusion (has no administration in time range)  hydrALAZINE (APRESOLINE) injection 5 mg (has no administration in time range)  fentaNYL (SUBLIMAZE) injection 50 mcg (50 mcg Intravenous Given 07/09/23 2349)    Mobility non-ambulatory     Focused Assessments Hip Fx   R Recommendations: See Admitting Provider Note  Report given to:   Additional Notes: .

## 2023-07-11 ENCOUNTER — Encounter (HOSPITAL_COMMUNITY): Payer: Self-pay | Admitting: Orthopedic Surgery

## 2023-07-11 DIAGNOSIS — S72141A Displaced intertrochanteric fracture of right femur, initial encounter for closed fracture: Secondary | ICD-10-CM | POA: Diagnosis not present

## 2023-07-11 DIAGNOSIS — W19XXXA Unspecified fall, initial encounter: Secondary | ICD-10-CM | POA: Diagnosis not present

## 2023-07-11 LAB — ABO/RH: ABO/RH(D): O POS

## 2023-07-11 LAB — HEMOGLOBIN AND HEMATOCRIT, BLOOD
HCT: 24.7 % — ABNORMAL LOW (ref 36.0–46.0)
Hemoglobin: 7.4 g/dL — ABNORMAL LOW (ref 12.0–15.0)

## 2023-07-11 LAB — PREPARE RBC (CROSSMATCH)

## 2023-07-11 MED ORDER — CEFAZOLIN SODIUM-DEXTROSE 1-4 GM/50ML-% IV SOLN
1.0000 g | Freq: Four times a day (QID) | INTRAVENOUS | Status: AC
  Administered 2023-07-11 (×3): 1 g via INTRAVENOUS
  Filled 2023-07-11 (×3): qty 50

## 2023-07-11 MED ORDER — DOCUSATE SODIUM 100 MG PO CAPS
100.0000 mg | ORAL_CAPSULE | Freq: Two times a day (BID) | ORAL | Status: DC
  Administered 2023-07-11 – 2023-07-13 (×5): 100 mg via ORAL
  Filled 2023-07-11 (×5): qty 1

## 2023-07-11 MED ORDER — ONDANSETRON HCL 4 MG/2ML IJ SOLN: 4.0000 mg | Freq: Four times a day (QID) | INTRAMUSCULAR | Status: DC | PRN

## 2023-07-11 MED ORDER — ASPIRIN 81 MG PO TBEC
81.0000 mg | DELAYED_RELEASE_TABLET | Freq: Every day | ORAL | Status: DC
  Administered 2023-07-11 – 2023-07-13 (×3): 81 mg via ORAL
  Filled 2023-07-11 (×3): qty 1

## 2023-07-11 MED ORDER — SODIUM CHLORIDE 0.9% IV SOLUTION: Freq: Once | INTRAVENOUS | Status: AC

## 2023-07-11 MED ORDER — ONDANSETRON HCL 4 MG PO TABS: 4.0000 mg | ORAL_TABLET | Freq: Four times a day (QID) | ORAL | Status: DC | PRN

## 2023-07-11 MED ORDER — IRBESARTAN 75 MG PO TABS
37.5000 mg | ORAL_TABLET | Freq: Every day | ORAL | Status: DC
  Administered 2023-07-11 – 2023-07-13 (×3): 37.5 mg via ORAL
  Filled 2023-07-11 (×3): qty 1

## 2023-07-11 NOTE — Evaluation (Signed)
Physical Therapy Evaluation Patient Details Name: Chloe Wilcox MRN: [id number] DOB: 19-Aug-1935 Today's Date: 07/11/2023  History of Present Illness  88 yo female admitted 07/09/23 after  a fall and sustained  a left Intertrochanteric hip fracture. S/P IM nail right  on 07/10/23.XBM:WUXL, HTN, depression  Clinical Impression  Pt admitted with above diagnosis.  Pt currently with functional limitations due to the deficits listed below (see PT Problem List). Pt will benefit from acute skilled PT to increase their independence and safety with mobility to allow discharge.      The patient is willing to mobilize with therapy. Patient requiring total assistance to move to sitting and back to supine. Patient to get 1 unit of blood.  Patient was ambulatory with a cane at baseline and  lived with family.  Patient will benefit from continued inpatient follow up therapy, <3 hours/day       If plan is discharge home, recommend the following: Two people to help with walking and/or transfers;A lot of help with bathing/dressing/bathroom;Assistance with cooking/housework;Help with stairs or ramp for entrance   Can travel by private vehicle   No    Equipment Recommendations Rolling walker (2 wheels)  Recommendations for Other Services       Functional Status Assessment Patient has had a recent decline in their functional status and demonstrates the ability to make significant improvements in function in a reasonable and predictable amount of time.     Precautions / Restrictions Precautions Precautions: Fall Restrictions Weight Bearing Restrictions: No RLE Weight Bearing: Weight bearing as tolerated      Mobility  Bed Mobility Overal bed mobility: Needs Assistance Bed Mobility: Supine to Sit, Sit to Supine     Supine to sit: +2 for physical assistance, +2 for safety/equipment, HOB elevated, Total assist Sit to supine: +2 for physical assistance, +2 for safety/equipment, Total assist    General bed mobility comments: assisted patient to move legs to bed edge, assisted with trunk to sit upright and to scoot forward. patient did self assist scooting a little to bed edge using UE's    Transfers Overall transfer level: Needs assistance Equipment used: Rolling walker (2 wheels) Transfers: Sit to/from Stand Sit to Stand: Max assist, +2 physical assistance, +2 safety/equipment           General transfer comment: max assist to rise from the bed and to sit down. Able to take  several small side steps along the bed edge supported on Rw and Therapist  support.    Ambulation/Gait                  Stairs            Wheelchair Mobility     Tilt Bed    Modified Rankin (Stroke Patients Only)       Balance Overall balance assessment: Needs assistance Sitting-balance support: Bilateral upper extremity supported, Feet supported Sitting balance-Leahy Scale: Poor     Standing balance support: Bilateral upper extremity supported, During functional activity, Reliant on assistive device for balance Standing balance-Leahy Scale: Poor                               Pertinent Vitals/Pain Pain Assessment Breathing: occasional labored breathing, short period of hyperventilation Negative Vocalization: occasional moan/groan, low speech, negative/disapproving quality Facial Expression: facial grimacing Body Language: tense, distressed pacing, fidgeting Consolability: distracted or reassured by voice/touch PAINAD Score: 6    Home Living Family/patient  expects to be discharged to:: Private residence Living Arrangements: Other relatives Available Help at Discharge: Family;Available 24 hours/day Type of Home: House Home Access: Stairs to enter Entrance Stairs-Rails: Right Entrance Stairs-Number of Steps: 3   Home Layout: One level Home Equipment: Rollator (4 wheels);Rolling Walker (2 wheels);Cane - single point;Grab bars - tub/shower      Prior  Function Prior Level of Function : Needs assist;History of Falls (last six months)       Physical Assist : ADLs (physical)   ADLs (physical): IADLs Mobility Comments: Per Goddaughter in room, pt would receive light assitance in the morning due to OA stiffness. Then pt would be able to independetly mobilize. Pt uses her SPC for ambulation. ADLs Comments: Light assist for socks/shoes in the morning if she is stiff.     Extremity/Trunk Assessment   Upper Extremity Assessment Upper Extremity Assessment: Generalized weakness    Lower Extremity Assessment Lower Extremity Assessment: LLE deficits/detail RLE Deficits / Details: required assistance for moving the leg. able to bear  a a little weight on the leg when standing. LLE Deficits / Details: Baseline pain and limitation to knee.    Cervical / Trunk Assessment Cervical / Trunk Assessment: Kyphotic  Communication   Communication Communication: Hearing impairment Cueing Techniques: Verbal cues;Visual cues;Tactile cues  Cognition Arousal: Alert Behavior During Therapy: WFL for tasks assessed/performed                                   General Comments: Goddaughter endorses age-related memory loss but not severe. Able to follow simple directions        General Comments      Exercises     Assessment/Plan    PT Assessment Patient needs continued PT services  PT Problem List Decreased strength;Decreased activity tolerance;Decreased mobility;Decreased safety awareness;Decreased balance;Decreased knowledge of use of DME;Decreased knowledge of precautions       PT Treatment Interventions DME instruction;Therapeutic activities;Gait training;Functional mobility training;Therapeutic exercise;Patient/family education    PT Goals (Current goals can be found in the Care Plan section)  Acute Rehab PT Goals Patient Stated Goal: to return home PT Goal Formulation: With patient/family Time For Goal Achievement:  07/25/23 Potential to Achieve Goals: Fair    Frequency Min 1X/week     Co-evaluation PT/OT/SLP Co-Evaluation/Treatment: Yes Reason for Co-Treatment: For patient/therapist safety PT goals addressed during session: Mobility/safety with mobility OT goals addressed during session: ADL's and self-care       AM-PAC PT "6 Clicks" Mobility  Outcome Measure Help needed turning from your back to your side while in a flat bed without using bedrails?: Total Help needed moving from lying on your back to sitting on the side of a flat bed without using bedrails?: Total Help needed moving to and from a bed to a chair (including a wheelchair)?: Total Help needed standing up from a chair using your arms (e.g., wheelchair or bedside chair)?: Total Help needed to walk in hospital room?: Total Help needed climbing 3-5 steps with a railing? : Total 6 Click Score: 6    End of Session Equipment Utilized During Treatment: Gait belt Activity Tolerance: Patient tolerated treatment well Patient left: in bed;with call bell/phone within reach;with bed alarm set;with family/visitor present Nurse Communication: Mobility status PT Visit Diagnosis: Unsteadiness on feet (R26.81);History of falling (Z91.81);Muscle weakness (generalized) (M62.81);Difficulty in walking, not elsewhere classified (R26.2);Pain Pain - Right/Left: Right Pain - part of body: Hip  Time: 1914-7829 PT Time Calculation (min) (ACUTE ONLY): 22 min   Charges:   PT Evaluation $PT Eval Low Complexity: 1 Low   PT General Charges $$ ACUTE PT VISIT: 1 Visit         Blanchard Kelch PT Acute Rehabilitation Services Office [phone] Weekend pager-[phone]   Rada Hay 07/11/2023, 1:13 PM

## 2023-07-11 NOTE — Progress Notes (Signed)
RN returned call to pts daughter, Chloe Wilcox. Chloe Wilcox is aware pt has consented to receive 1 pint of blood. [name] requested to speak with the social worker assigned to pt. RN sent message to Memory Argue, SW.

## 2023-07-11 NOTE — Progress Notes (Signed)
[facility] PROGRESS NOTE    Progress Note  Chloe Wilcox  ZOX:[id number] DOB: 07/03/1935 DOA: 07/09/2023 PCP: Baldemar Lenis, MD     Brief Narrative:   Chloe Wilcox is an 88 y.o. female past medical history of COPD, essential hypertension anemia of chronic disease with a baseline hemoglobin of 10-12 comes [facility] long hospital for acute right intertrochanteric hip fracture after mechanical fall at ground-level.   Assessment/Plan:   Closed displaced intertrochanteric fracture of right femur [facility]) Orthopedic surgery was consulted she is status post right hip intertrochanteric nailing on 07/10/2023.   During her diet. PT OT evaluation is pending. Continue narcotics for analgesics continue MiraLAX p.o. twice daily. Anticipate she will need skilled nursing facility  Mechanical fall: CT of the head showed no acute findings. PT OT eval is pending.  COPD (chronic obstructive pulmonary disease) (HCC) Continue inhalers.  Essential hypertension Allow permissive hypertension. Restart ACE inhibitor continue Norvasc.  Depression: Currently n.p.o. can resume Celexa when she is able to tolerate orals.  Hyperlipidemia: Resume statins once able to take orals.  Anemia of chronic disease/acute blood loss anemia: Globin this morning 7.4 will give a unit of packed red blood cells for hematocrit posttransfusion    DVT prophylaxis: lovenox Family Communication:none Status is: Inpatient Remains inpatient appropriate because: Acute closed left hip fracture    Code Status:     Code Status Orders  (From admission, onward)           Start     Ordered   07/10/23 0208  Full code  Continuous       Question:  By:  Answer:  Consent: discussion documented in EHR   07/10/23 0207           Code Status History     Date Active Date Inactive Code Status Order ID Comments User Context   07/24/2020 0804 07/28/2020 0204 Partial Code [id number]  Alba Cory, MD ED    06/14/2019 1503 06/16/2019 1909 Full Code [id number]  Hughie Closs, MD Inpatient         IV Access:   Peripheral IV   Procedures and diagnostic studies:   DG HIP UNILAT WITH PELVIS 2-3 VIEWS RIGHT  Result Date: 07/10/2023 CLINICAL DATA:  Known right intratrochanteric fracture EXAM: DG HIP (WITH OR WITHOUT PELVIS) 2-3V RIGHT COMPARISON:  Film from earlier in the same day. FLUOROSCOPY TIME:  Radiation Exposure Index (as provided by the fluoroscopic device): 2.5 mGy If the device does not provide the exposure index: Fluoroscopy Time:  29 seconds Number of Acquired Images:  16 FINDINGS: Initial images again demonstrate the intratrochanteric fracture. A medullary guide was then placed with medullary rod in the proximal femur. Fixation screw was then placed traversing the femoral neck. Distal fixation screw is noted in the midshaft. Fracture fragments are in near anatomic alignment. IMPRESSION: ORIF of right intratrochanteric fracture Electronically Signed   By: Alcide Clever M.D.   On: 07/10/2023 19:35   DG C-Arm 1-60 Min-No Report  Result Date: 07/10/2023 Fluoroscopy was utilized by the requesting physician.  No radiographic interpretation.   CT Head Wo Contrast  Result Date: 07/10/2023 CLINICAL DATA:  Recent fall with headaches, initial encounter EXAM: CT HEAD WITHOUT CONTRAST TECHNIQUE: Contiguous axial images were obtained from the base of the skull through the vertex without intravenous contrast. RADIATION DOSE REDUCTION: This exam was performed according to the departmental dose-optimization program which includes automated exposure control, adjustment of the mA and/or kV according to patient size and/or  use of iterative reconstruction technique. COMPARISON:  None Available. FINDINGS: Brain: No evidence of acute infarction, hemorrhage, hydrocephalus, extra-axial collection or mass lesion/mass effect. Chronic white matter ischemic changes and atrophic changes are noted. Vascular: No hyperdense  vessel or unexpected calcification. Skull: Normal. Negative for fracture or focal lesion. Sinuses/Orbits: No acute finding. Other: None. IMPRESSION: Chronic atrophic and ischemic changes without acute abnormality Electronically Signed   By: Alcide Clever M.D.   On: 07/10/2023 00:21   DG Chest 1 View  Result Date: 07/10/2023 CLINICAL DATA:  Fall EXAM: CHEST  1 VIEW COMPARISON:  02/28/2023 FINDINGS: Lungs are clear.  No pleural effusion or pneumothorax. The heart is normal in size.  Thoracic aortic atherosclerosis. IMPRESSION: No acute cardiopulmonary disease. Electronically Signed   By: Charline Bills M.D.   On: 07/10/2023 00:15   DG Hip Unilat W or Wo Pelvis 2-3 Views Right  Result Date: 07/10/2023 CLINICAL DATA:  Fall, right hip pain EXAM: DG HIP (WITH OR WITHOUT PELVIS) 2-3V RIGHT COMPARISON:  None Available. FINDINGS: Mildly comminuted, intertrochanteric right hip fracture with varus angulation and foreshortening. Cortically based sclerosis along the medial femoral shafts bilaterally, of uncertain etiology but reflecting a smooth/benign appearance. Left hip joint space is preserved. Visualized bony pelvis appears intact. Large calcified pelvic fibroid. IMPRESSION: Intertrochanteric right hip fracture, as above. Electronically Signed   By: Charline Bills M.D.   On: 07/10/2023 00:14     Medical Consultants:   None.   Subjective:    Chloe Wilcox pain is controlled feels better today.  She relates the foot is good  Objective:    Vitals:   07/11/23 0119 07/11/23 0500 07/11/23 0515 07/11/23 0822  BP: (!) 128/57  (!) 131/56   Pulse: 73  77   Resp: 16  17   Temp: 98.3 F (36.8 C)  98.4 F (36.9 C)   TempSrc:   Oral   SpO2: 100%  98% 98%  Weight:  57 kg    Height:       SpO2: 98 % O2 Flow Rate (L/min): 4 L/min (down to 3 lpm (PP 2-3 LPM Dep))   Intake/Output Summary (Last 24 hours) at 07/11/2023 0855 Last data filed at 07/11/2023 0600 Gross per 24 hour  Intake 500 ml   Output 400 ml  Net 100 ml   Filed Weights   07/09/23 2325 07/10/23 1416 07/11/23 0500  Weight: 52.6 kg 52.6 kg 57 kg    Exam: General exam: In no acute distress. Respiratory system: Good air movement and clear to auscultation. Cardiovascular system: S1 & S2 heard, RRR. No JVD. Gastrointestinal system: Abdomen is nondistended, soft and nontender.  Extremities: No pedal edema. Skin: No rashes, lesions or ulcers Psychiatry: Judgement and insight appear normal. Mood & affect appropriate. Data Reviewed:    Labs: Basic Metabolic Panel: Recent Labs  Lab 07/09/23 2320 07/10/23 0445  NA 139 136  K 4.0 3.1*  CL 98 96*  CO2 34* 34*  GLUCOSE 111* 129*  BUN 12 10  CREATININE 0.55 0.46  CALCIUM 9.1 8.8*  MG  --  2.0   GFR Estimated Creatinine Clearance: 39.9 mL/min (by C-G formula based on SCr of 0.46 mg/dL). Liver Function Tests: Recent Labs  Lab 07/10/23 0445  AST 17  ALT 16  ALKPHOS 64  BILITOT 0.7  PROT 6.1*  ALBUMIN 3.9   No results for input(s): "LIPASE", "AMYLASE" in the last 168 hours. No results for input(s): "AMMONIA" in the last 168 hours. Coagulation profile Recent Labs  Lab 07/10/23 0445  INR 1.2   COVID-19 Labs  No results for input(s): "DDIMER", "FERRITIN", "LDH", "CRP" in the last 72 hours.  Lab Results  Component Value Date   SARSCOV2NAA NEGATIVE 07/29/2021   SARSCOV2NAA POSITIVE (A) 09/03/2020   SARSCOV2NAA NEGATIVE 07/23/2020   SARSCOV2NAA Not Detected 10/14/2019    CBC: Recent Labs  Lab 07/09/23 2320 07/10/23 0445 07/11/23 0807  WBC 7.2 11.1*  --   NEUTROABS 5.5 9.6*  --   HGB 9.8* 9.1* 7.4*  HCT 32.2* 29.7* 24.7*  MCV 92.3 93.1  --   PLT 219 198  --    Cardiac Enzymes: No results for input(s): "CKTOTAL", "CKMB", "CKMBINDEX", "TROPONINI" in the last 168 hours. BNP (last 3 results) No results for input(s): "PROBNP" in the last 8760 hours. CBG: No results for input(s): "GLUCAP" in the last 168 hours. D-Dimer: No results for  input(s): "DDIMER" in the last 72 hours. Hgb A1c: No results for input(s): "HGBA1C" in the last 72 hours. Lipid Profile: No results for input(s): "CHOL", "HDL", "LDLCALC", "TRIG", "CHOLHDL", "LDLDIRECT" in the last 72 hours. Thyroid function studies: No results for input(s): "TSH", "T4TOTAL", "T3FREE", "THYROIDAB" in the last 72 hours.  Invalid input(s): "FREET3" Anemia work up: No results for input(s): "VITAMINB12", "FOLATE", "FERRITIN", "TIBC", "IRON", "RETICCTPCT" in the last 72 hours. Sepsis Labs: Recent Labs  Lab 07/09/23 2320 07/10/23 0445  WBC 7.2 11.1*   Microbiology Recent Results (from the past 240 hour(s))  Surgical pcr screen     Status: None   Collection Time: 07/10/23  8:48 AM   Specimen: Nasal Mucosa; Nasal Swab  Result Value Ref Range Status   MRSA, PCR NEGATIVE NEGATIVE Final   Staphylococcus aureus NEGATIVE NEGATIVE Final    Comment: (NOTE) The Xpert SA Assay (FDA approved for NASAL specimens in patients 66 years of age and older), is one component of a comprehensive surveillance program. It is not intended to diagnose infection nor to guide or monitor treatment. Performed at [facility], [address] [place], [place] [address]      Medications:    aspirin EC  81 mg Oral Daily   docusate sodium  100 mg Oral BID   fluticasone furoate-vilanterol  1 puff Inhalation Daily   And   umeclidinium bromide  1 puff Inhalation Daily   polyethylene glycol  17 g Oral BID   Continuous Infusions:   ceFAZolin (ANCEF) IV 1 g (07/11/23 0600)      LOS: 1 day   Marinda Elk  [facility]  07/11/2023, 8:55 AM

## 2023-07-11 NOTE — [place] FL2 (Addendum)
[place] MEDICAID [id number] LEVEL OF CARE FORM     IDENTIFICATION  Patient Name: Chloe Wilcox Birthdate: Jan 09, 1935 Sex: female Admission Date (Current Location): 07/09/2023  [facility] and [place] Number:  Producer, television/film/video and Address:  [facility],  [address] [place], [place] [address]      Provider Number: [id number]  Attending Physician Name and Address:  Marinda Elk, MD  Relative Name and Phone Number:  [name] (daughter) Ph: [phone]    Current Level of Care: Hospital Recommended Level of Care: Skilled Nursing Facility Prior Approval Number:    Date Approved/Denied:   PASRR Number: [id number]  Discharge Plan: SNF    Current Diagnoses: Patient Active Problem List   Diagnosis Date Noted   Closed displaced intertrochanteric fracture of right femur (HCC) 07/10/2023   Fall 07/10/2023   Depression 07/10/2023   Hyperlipidemia 07/10/2023   Anemia of chronic disease 07/10/2023   History of COVID-19 09/14/2020   Abnormal findings on diagnostic imaging of lung 09/14/2020   Hypoxemia 07/23/2020   Respiratory failure (HCC) 06/15/2019   Acute respiratory failure with hypoxia (HCC) 06/14/2019   COPD (chronic obstructive pulmonary disease) (HCC) 06/14/2019   COPD with acute bronchitis (HCC) 06/14/2019   Hypokalemia 06/14/2019   Essential hypertension 06/14/2019    Orientation RESPIRATION BLADDER Height & Weight     Self, Time, Situation, Place  O2 (2L/min) Incontinent Weight: 125 lb 10.6 oz (57 kg) Height:  5\' 2"  (157.5 cm)  BEHAVIORAL SYMPTOMS/MOOD NEUROLOGICAL BOWEL NUTRITION STATUS     (N/A) Continent Diet (Regular diet)  AMBULATORY STATUS COMMUNICATION OF NEEDS Skin   Extensive Assist Verbally Surgical wounds                       Personal Care Assistance Level of Assistance  Bathing, Feeding, Dressing Bathing Assistance: Limited assistance Feeding assistance: Independent Dressing Assistance: Limited assistance      Functional Limitations Info  Sight, Hearing, Speech Sight Info: Impaired Hearing Info: Impaired Speech Info: Adequate    SPECIAL CARE FACTORS FREQUENCY  PT (By licensed PT), OT (By licensed OT)     PT Frequency: 5x's/week OT Frequency: 5x's/week            Contractures Contractures Info: Not present    Additional Factors Info  Code Status, Allergies Code Status Info: Full Allergies Info: NKA           Current Medications (07/11/2023):  This is the current hospital active medication list Current Facility-Administered Medications  Medication Dose Route Frequency Provider Last Rate Last Admin   0.9 %  sodium chloride infusion (Manually program via Guardrails IV Fluids)   Intravenous Once David Stall, Darin Engels, MD       acetaminophen (TYLENOL) tablet 650 mg  650 mg Oral Q6H PRN Porterfield, Amber, PA-C   650 mg at 07/11/23 7425   Or   acetaminophen (TYLENOL) suppository 650 mg  650 mg Rectal Q6H PRN Porterfield, Amber, PA-C       albuterol (PROVENTIL) (2.5 MG/3ML) 0.083% nebulizer solution 2.5 mg  2.5 mg Nebulization Q4H PRN Porterfield, Amber, PA-C       aspirin EC tablet 81 mg  81 mg Oral Daily Porterfield, Amber, PA-C   81 mg at 07/11/23 0834   ceFAZolin (ANCEF) IVPB 1 g/50 mL premix  1 g Intravenous Q6H Porterfield, Amber, PA-C 100 mL/hr at 07/11/23 0600 1 g at 07/11/23 0600   docusate sodium (COLACE) capsule 100 mg  100 mg Oral BID Porterfield,  Amber, PA-C   100 mg at 07/11/23 0833   fluticasone furoate-vilanterol (BREO ELLIPTA) 200-25 MCG/ACT 1 puff  1 puff Inhalation Daily Porterfield, Amber, PA-C   1 puff at 07/11/23 1610   And   umeclidinium bromide (INCRUSE ELLIPTA) 62.5 MCG/ACT 1 puff  1 puff Inhalation Daily Porterfield, Amber, PA-C   1 puff at 07/11/23 9604   hydrALAZINE (APRESOLINE) injection 5 mg  5 mg Intravenous Q4H PRN Porterfield, Amber, PA-C       HYDROmorphone (DILAUDID) injection 0.5 mg  0.5 mg Intravenous Q2H PRN Porterfield, Amber, PA-C   0.5 mg at  07/11/23 0557   irbesartan (AVAPRO) tablet 37.5 mg  37.5 mg Oral Daily Marinda Elk, MD   37.5 mg at 07/11/23 1118   ondansetron (ZOFRAN) tablet 4 mg  4 mg Oral Q6H PRN Porterfield, Amber, PA-C       Or   ondansetron (ZOFRAN) injection 4 mg  4 mg Intravenous Q6H PRN Porterfield, Amber, PA-C       Oral care mouth rinse  15 mL Mouth Rinse PRN Porterfield, Amber, PA-C       polyethylene glycol (MIRALAX / GLYCOLAX) packet 17 g  17 g Oral BID Porterfield, Amber, PA-C   17 g at 07/11/23 5409     Discharge Medications: Please see discharge summary for a list of discharge medications.  Relevant Imaging Results:  Relevant Lab Results:   Additional Information SSN: [id number]  Ewing Schlein, LCSW

## 2023-07-11 NOTE — TOC Initial Note (Signed)
Transition of Care [facility]) - Initial/Assessment Note   Patient Details  Name: Chloe Wilcox MRN: [id number] Date of Birth: 11-04-1935  Transition of Care [facility]) CM/SW Contact:    Ewing Schlein, LCSW Phone Number: 07/11/2023, 2:21 PM  Clinical Narrative: PT/OT evaluations recommended SNF. Patient's family are agreeable to rehab. FL2 done; PASRR received. Initial referral faxed out. TOC awaiting bed offers.  Expected Discharge Plan: Skilled Nursing Facility Barriers to Discharge: SNF Pending bed offer, Insurance Authorization  Patient Goals and CMS Choice Patient states their goals for this hospitalization and ongoing recovery are:: Go to short-term rehab CMS Medicare.gov Compare Post Acute Care list provided to:: Patient Represenative (must comment) [name] (daughter)) Choice offered to / list presented to : Adult Children  Expected Discharge Plan and Services In-house Referral: Clinical Social Work Post Acute Care Choice: Skilled Nursing Facility Living arrangements for the past 2 months: Single Family Home  Prior Living Arrangements/Services Living arrangements for the past 2 months: Single Family Home Patient language and need for interpreter reviewed:: Yes Do you feel safe going back to the place where you live?: Yes      Need for Family Participation in Patient Care: Yes (Comment) Care giver support system in place?: Yes (comment) Criminal Activity/Legal Involvement Pertinent to Current Situation/Hospitalization: No - Comment as needed  Activities of Daily Living Home Assistive Devices/Equipment: None ADL Screening (condition at time of admission) Patient's cognitive ability adequate to safely complete daily activities?: Yes Is the patient deaf or have difficulty hearing?: Yes Does the patient have difficulty seeing, even when wearing glasses/contacts?: No Does the patient have difficulty concentrating, remembering, or making decisions?: No Patient able to express need  for assistance with ADLs?: Yes Does the patient have difficulty dressing or bathing?: No Independently performs ADLs?: Yes (appropriate for developmental age) Does the patient have difficulty walking or climbing stairs?: No Weakness of Legs: Right Weakness of Arms/Hands: None  Permission Sought/Granted Permission sought to share information with : Oceanographer granted to share information with : Yes, Verbal Permission Granted Permission granted to share info w AGENCY: SNFs  Emotional Assessment Appearance:: Appears stated age Orientation: : Oriented to Self, Oriented to Place, Oriented to  Time, Oriented to Situation Alcohol / Substance Use: Not Applicable Psych Involvement: No (comment)  Admission diagnosis:  Closed right hip fracture (HCC) [S72.001A] Fall, initial encounter [W19.XXXA] Closed displaced intertrochanteric fracture of right femur, initial encounter [facility]) [S72.141A] Patient Active Problem List   Diagnosis Date Noted   Closed displaced intertrochanteric fracture of right femur (HCC) 07/10/2023   Fall 07/10/2023   Depression 07/10/2023   Hyperlipidemia 07/10/2023   Anemia of chronic disease 07/10/2023   History of COVID-19 09/14/2020   Abnormal findings on diagnostic imaging of lung 09/14/2020   Hypoxemia 07/23/2020   Respiratory failure (HCC) 06/15/2019   Acute respiratory failure with hypoxia (HCC) 06/14/2019   COPD (chronic obstructive pulmonary disease) (HCC) 06/14/2019   COPD with acute bronchitis (HCC) 06/14/2019   Hypokalemia 06/14/2019   Essential hypertension 06/14/2019   PCP:  Noberto Retort, MD Pharmacy:   [facility] [phone] [place] Otto, [place] - [address] [place] [address] Phone: [phone] Fax: [phone]  [id number] , [place] - [address] AT [facility] [address] [place] [address] Phone: [phone] Fax:  [phone]  Social Determinants of Health (SDOH) Social History: SDOH Screenings   Food Insecurity: No Food Insecurity (07/10/2023)  Housing: Low  Risk  (07/10/2023)  Transportation Needs: No Transportation Needs (07/10/2023)  Utilities: Not At Risk (07/10/2023)  Tobacco Use: Medium Risk (07/10/2023)   SDOH Interventions:    Readmission Risk Interventions     No data to display

## 2023-07-11 NOTE — Evaluation (Signed)
Occupational Therapy Evaluation Patient Details Name: Chloe Wilcox MRN: [id number] DOB: 1935-10-21 Today's Date: 07/11/2023   History of Present Illness 88 yo female admitted 07/09/23 after  a fall and sustained  a left Intertrochanteric hip fracture. S/P IM nail right  on 07/10/23.WUJ:WJXB, HTN, depression   Clinical Impression   Patient is currently requiring assistance with ADLs including up to Max assist +2 people with Lower body ADLs in a standing postiion, and up to minimal assist with Upper body ADLs while seated EOB,  as well as Total assist of 2 people with bed mobility and Max assist +2 to stand and take lateral shuffling, unsteady steps to [facility] with Mod-Max As of 2 and RW.  with functional transfers to toilet.   Current level of function is below patient's typical baseline.  During this evaluation, patient was limited by generalized weakness, impaired activity tolerance, and post-op pain, all of which has the potential to impact patient's safety and independence during functional mobility, as well as performance for ADLs.  Patient lives with daughter and granddaughter, who will be able to provide 24/7 supervision and assistance.  Patient demonstrates good rehab potential, and should benefit from continued skilled occupational therapy services while in acute care to maximize safety, independence and quality of life at home.  Continued occupational therapy services are recommended.  ?        If plan is discharge home, recommend the following: Two people to help with walking and/or transfers;A lot of help with walking and/or transfers;A lot of help with bathing/dressing/bathroom;Assistance with cooking/housework;Direct supervision/assist for financial management;Assist for transportation;Supervision due to cognitive status;Direct supervision/assist for medications management    Functional Status Assessment  Patient has had a recent decline in their functional status and demonstrates the  ability to make significant improvements in function in a reasonable and predictable amount of time.  Equipment Recommendations  BSC/3in1;Tub/shower seat    Recommendations for Other Services       Precautions / Restrictions Precautions Precautions: Fall Restrictions Weight Bearing Restrictions: No RLE Weight Bearing: Weight bearing as tolerated      Mobility Bed Mobility Overal bed mobility: Needs Assistance Bed Mobility: Supine to Sit, Sit to Supine     Supine to sit: +2 for physical assistance, +2 for safety/equipment, HOB elevated, Total assist Sit to supine: +2 for physical assistance, +2 for safety/equipment, Total assist   General bed mobility comments: assisted patient to move legs to bed dge, a assisted with trun to sit upright and to scoot forward. patient did self assist scooting a little to bed edge using UE's    Transfers                          Balance Overall balance assessment: Needs assistance Sitting-balance support: Bilateral upper extremity supported, Feet supported Sitting balance-Leahy Scale: Poor     Standing balance support: Bilateral upper extremity supported, During functional activity, Reliant on assistive device for balance Standing balance-Leahy Scale: Poor                             ADL either performed or assessed with clinical judgement   ADL Overall ADL's : Needs assistance/impaired Eating/Feeding: Set up Eating/Feeding Details (indicate cue type and reason): RN requested that pt return to bed after evaluation because of pending PRBCs Grooming: Minimal assistance;Bed level;Wash/dry face Grooming Details (indicate cue type and reason): Min As for thoroughtness Upper Body Bathing: Minimal  assistance;Sitting   Lower Body Bathing: Maximal assistance;Sitting/lateral leans   Upper Body Dressing : Minimal assistance;Sitting   Lower Body Dressing: Total assistance;Sitting/lateral leans;Bed level   Toilet Transfer:  +2 for safety/equipment;Maximal assistance;Cueing for safety;Cueing for sequencing;Rolling walker (2 wheels) Toilet Transfer Details (indicate cue type and reason): Pt stood from EOB with Max As of 2 people. Pt used RW to take ~4 lateral steps to [facility] with RW, stepping with RLE and pivoting with LT. Max As to control descent back to EOB. Toileting- Clothing Manipulation and Hygiene: Maximal assistance;Bed level Toileting - Clothing Manipulation Details (indicate cue type and reason): OT appled fresh pure wick.     Functional mobility during ADLs: Maximal assistance;+2 for physical assistance;+2 for safety/equipment       Vision Ability to See in Adequate Light: 1 Impaired Additional Comments: baseline ptosis RT eye. Vision adequate for tasks today     Perception         Praxis         Pertinent Vitals/Pain Pain Assessment Pain Assessment: PAINAD Breathing: occasional labored breathing, short period of hyperventilation Negative Vocalization: occasional moan/groan, low speech, negative/disapproving quality Facial Expression: facial grimacing Body Language: tense, distressed pacing, fidgeting Consolability: distracted or reassured by voice/touch PAINAD Score: 6 Pain Intervention(s): Limited activity within patient's tolerance, Premedicated before session, Repositioned, Monitored during session, Ice applied     Extremity/Trunk Assessment Upper Extremity Assessment Upper Extremity Assessment: Generalized weakness   Lower Extremity Assessment Lower Extremity Assessment: LLE deficits/detail RLE Deficits / Details: required assistance for moving the leg. able to bear  a a little weight on the leg when standing. LLE Deficits / Details: Baseline pain and limitation to knee.   Cervical / Trunk Assessment Cervical / Trunk Assessment: Kyphotic   Communication Communication Communication: Hearing impairment Cueing Techniques: Verbal cues;Visual cues;Tactile cues   Cognition Arousal:  Alert Behavior During Therapy: WFL for tasks assessed/performed Overall Cognitive Status: History of cognitive impairments - at baseline                                 General Comments: Goddaughter endorses age-related memory loss but not severe. Able to follow simple directions     General Comments       Exercises     Shoulder Instructions      Home Living Family/patient expects to be discharged to:: Private residence Living Arrangements: Other relatives Available Help at Discharge: Family;Available 24 hours/day Type of Home: House Home Access: Stairs to enter [employer] of Steps: 3 Entrance Stairs-Rails: Right Home Layout: One level     Bathroom Shower/Tub: Producer, television/film/video: Handicapped height Bathroom Accessibility: Yes How Accessible: Accessible via walker Home Equipment: Rollator (4 wheels);Rolling Walker (2 wheels);Cane - single point;Grab bars - tub/shower          Prior Functioning/Environment Prior Level of Function : Needs assist;History of Falls (last six months)       Physical Assist : ADLs (physical)   ADLs (physical): IADLs Mobility Comments: Per Goddaughter in room, pt would receive light assitance in the morning due to OA stiffness. Then pt would be able to independetly mobilize. Pt uses her SPC for ambulation. ADLs Comments: Light assist for socks/shoes in the morning if she is stiff.        OT Problem List: Decreased strength;Impaired vision/perception;Decreased knowledge of use of DME or AE;Decreased cognition;Decreased activity tolerance;Decreased range of motion;Pain;Impaired balance (sitting and/or standing)  OT Treatment/Interventions:      OT Goals(Current goals can be found in the care plan section) Acute Rehab OT Goals Patient Stated Goal: Per Goddaughter, for pt to progress her mobility so that family can safely care for her at home. Agreeable to SNF if needed OT Goal Formulation: With  patient/family Time For Goal Achievement: 07/25/23 Potential to Achieve Goals: Good ADL Goals Pt Will Perform Grooming: with supervision;standing Pt Will Perform Lower Body Bathing: with contact guard assist;sitting/lateral leans;sit to/from stand Pt Will Perform Lower Body Dressing: with adaptive equipment;with supervision;with set-up;sit to/from stand;sitting/lateral leans Pt Will Transfer to Toilet: with supervision;ambulating Pt Will Perform Toileting - Clothing Manipulation and hygiene: with adaptive equipment;with caregiver independent in assisting;with supervision  OT Frequency:      Co-evaluation PT/OT/SLP Co-Evaluation/Treatment: Yes Reason for Co-Treatment: For patient/therapist safety PT goals addressed during session: Mobility/safety with mobility OT goals addressed during session: ADL's and self-care      AM-PAC OT "6 Clicks" Daily Activity     Outcome Measure Help from another person eating meals?: A Little Help from another person taking care of personal grooming?: A Little Help from another person toileting, which includes using toliet, bedpan, or urinal?: A Lot Help from another person bathing (including washing, rinsing, drying)?: A Lot Help from another person to put on and taking off regular upper body clothing?: A Little Help from another person to put on and taking off regular lower body clothing?: A Lot 6 Click Score: 15   End of Session Equipment Utilized During Treatment: Gait belt;Rolling walker (2 wheels) Nurse Communication: Other (comment) (RN approved therapy evaluation)  Activity Tolerance: Patient limited by pain Patient left: in bed;with call bell/phone within reach;with bed alarm set;with family/visitor present  OT Visit Diagnosis: Unsteadiness on feet (R26.81);Pain;Muscle weakness (generalized) (M62.81);History of falling (Z91.81) Pain - Right/Left: Right Pain - part of body: Leg;Hip                Time: 1610-9604 OT Time Calculation (min): 29  min Charges:  OT General Charges $OT Visit: 1 Visit OT Evaluation $OT Eval Low Complexity: 1 Low OT Treatments $Self Care/Home Management : 8-22 mins  Victorino Dike, OT Acute Rehab Services Office: [phone] 07/11/2023  Theodoro Clock 07/11/2023, 1:19 PM

## 2023-07-11 NOTE — Progress Notes (Addendum)
RN messaged David Stall at 1622 to make provider aware of VS as follows: T 99.5 BP 107/45, map 62, P 81, R 18, O2 99% 2LNC. David Stall responded with "?". RN returned message to David Stall as follows, "DO you want me to do anything for the BP? Or are you good with that?". Message was seen by David Stall with no response. RN obtained pts VS at 1921 as follows: T 99.1 oral, BP 114/45, map 63, P 80, R 16, O2 98 2LNC. RN messaged J. Garner Nash to make provider aware and included night nurse.

## 2023-07-11 NOTE — Progress Notes (Signed)
PATIENT ID: Chloe Wilcox  MRN: [id number]  DOB/AGE:  1935/06/14 / 88 y.o.  1 Day Post-Op Procedure(s) (LRB): INTRAMEDULLARY (IM) NAIL INTERTROCHANTERIC (Right)  Subjective: Patient reports that her right hip is feeling much better than yesterday.   Objective: Vital signs in last 24 hours: Temp:  [97.5 F (36.4 C)-98.8 F (37.1 C)] 98.4 F (36.9 C) (08/13 0515) Pulse Rate:  [70-85] 77 (08/13 0515) Resp:  [11-17] 17 (08/13 0515) BP: (112-202)/(51-93) 131/56 (08/13 0515) SpO2:  [88 %-100 %] 98 % (08/13 0822) Weight:  [52.6 kg-57 kg] 57 kg (08/13 0500)  Intake/Output from previous day: 08/12 0701 - 08/13 0700 In: 500 [P.O.:120; IV Piggyback:300] Out: 400 [Urine:350; Blood:50]   Recent Labs    07/09/23 2320 07/10/23 0445  HGB 9.8* 9.1*   Recent Labs    07/09/23 2320 07/10/23 0445  WBC 7.2 11.1*  RBC 3.49* 3.19*  HCT 32.2* 29.7*  PLT 219 198   Recent Labs    07/09/23 2320 07/10/23 0445  NA 139 136  K 4.0 3.1*  CL 98 96*  CO2 34* 34*  BUN 12 10  CREATININE 0.55 0.46  GLUCOSE 111* 129*  CALCIUM 9.1 8.8*   Recent Labs    07/10/23 0445  INR 1.2    Physical Exam: Neurologically intact Sensation intact distally Intact pulses distally Dorsiflexion/Plantar flexion intact Incision: dressing C/D/I No cellulitis present Compartment soft  Assessment/Plan: 1 Day Post-Op Procedure(s) (LRB): INTRAMEDULLARY (IM) NAIL INTERTROCHANTERIC (Right)   Advance diet Up with therapy Weight Bearing as Tolerated (WBAT) VTE prophylaxis:  aspirin 81mg  daily  Patient will likely need SNF placement. Follow up in office in 2 weeks.     L. Porterfield, PA-C 07/11/2023, 8:26 AM

## 2023-07-12 DIAGNOSIS — S72141A Displaced intertrochanteric fracture of right femur, initial encounter for closed fracture: Secondary | ICD-10-CM | POA: Diagnosis not present

## 2023-07-12 LAB — CBC
HCT: 30.7 % — ABNORMAL LOW (ref 36.0–46.0)
Hemoglobin: 9.4 g/dL — ABNORMAL LOW (ref 12.0–15.0)
MCH: 28.6 pg (ref 26.0–34.0)
MCHC: 30.6 g/dL (ref 30.0–36.0)
MCV: 93.3 fL (ref 80.0–100.0)
Platelets: 149 10*3/uL — ABNORMAL LOW (ref 150–400)
RBC: 3.29 MIL/uL — ABNORMAL LOW (ref 3.87–5.11)
RDW: 13.5 % (ref 11.5–15.5)
WBC: 10.6 10*3/uL — ABNORMAL HIGH (ref 4.0–10.5)
nRBC: 0 % (ref 0.0–0.2)

## 2023-07-12 LAB — BASIC METABOLIC PANEL
Anion gap: 9 (ref 5–15)
BUN: 10 mg/dL (ref 8–23)
CO2: 31 mmol/L (ref 22–32)
Calcium: 8.7 mg/dL — ABNORMAL LOW (ref 8.9–10.3)
Chloride: 97 mmol/L — ABNORMAL LOW (ref 98–111)
Creatinine, Ser: 0.52 mg/dL (ref 0.44–1.00)
GFR, Estimated: >60 mL/min (ref >60)
Glucose, Bld: 107 mg/dL — ABNORMAL HIGH (ref 70–99)
Potassium: 3.6 mmol/L (ref 3.5–5.1)
Sodium: 137 mmol/L (ref 135–145)

## 2023-07-12 MED ORDER — OXYCODONE HCL 5 MG PO TABS
5.0000 mg | ORAL_TABLET | ORAL | Status: DC | PRN
  Administered 2023-07-12 (×2): 5 mg via ORAL
  Filled 2023-07-12 (×2): qty 1

## 2023-07-12 MED ORDER — ACETAMINOPHEN 500 MG PO TABS
1000.0000 mg | ORAL_TABLET | Freq: Four times a day (QID) | ORAL | Status: DC
  Administered 2023-07-12 – 2023-07-13 (×3): 1000 mg via ORAL
  Filled 2023-07-12 (×3): qty 2

## 2023-07-12 MED ORDER — TRAMADOL HCL 50 MG PO TABS: 50.0000 mg | ORAL_TABLET | Freq: Four times a day (QID) | ORAL | 0 refills | Status: DC | PRN

## 2023-07-12 MED ORDER — CITALOPRAM HYDROBROMIDE 20 MG PO TABS
10.0000 mg | ORAL_TABLET | Freq: Every day | ORAL | Status: DC
  Administered 2023-07-12 – 2023-07-13 (×2): 10 mg via ORAL
  Filled 2023-07-12 (×2): qty 1

## 2023-07-12 NOTE — Plan of Care (Signed)
  Problem: Coping: Goal: Level of anxiety will decrease Outcome: Progressing   Problem: Pain Managment: Goal: General experience of comfort will improve Outcome: Progressing   

## 2023-07-12 NOTE — Plan of Care (Signed)
  Problem: Clinical Measurements: Goal: Respiratory complications will improve Outcome: Progressing   Problem: Activity: Goal: Risk for activity intolerance will decrease Outcome: Progressing   Problem: Pain Managment: Goal: General experience of comfort will improve Outcome: Progressing   

## 2023-07-12 NOTE — Progress Notes (Signed)
HOSPITALIST ROUNDING NOTE Chloe Wilcox FAO:[id number]  DOB: 1935-04-08  DOA: 07/09/2023  PCP: Noberto Retort, MD  07/12/2023,8:49 AM   LOS: 2 days      Code Status: Full code   From: Home  current Dispo: Likely will require      88 year old white female Known history of COPD hospitalized in 2020 and 2021 HTN depression Ex-smoker Brought to ED 8/11 secondary to mechanical fall and found to have acute R intertrochanteric hip fracture  8/12 Dr. Ave Filter performed IM nail right hip 8/thirteen 1 unit PRBC transfused  Plan  Close displaced intertrochanteric right femoral hip fracture PT OT recommending skilled PSR etc. as per social work Pain seems controlled but will discontinue IV Dilaudid and placed on Tylenol 1000 scheduled as per orders add small dose of Oxy IR 5 every 4 as needed Can add back home dosage of Zanaflex if needed DVT prophylaxis aspirin 81  Smoker previously, presumed COPD No wheeze Take off oxygen today and see how she does Continue albuterol 2.5 every 4 as needed as well as Breo Ellipta and Incruse Ellipta as per orders  Iron deficiency anemia with acute blood loss anemia from surgery which was expected Follow lab from this morning  HTN Get labs this morning Lasix 40/20 held Benicar has been reordered at 37.5 of a Velcro substitution Consider resumption amlodipine if pressures above 160  Depression Resume citalopram at a lower dose of 10 daily today   DVT prophylaxis: Aspirin only  Status is: Inpatient Remains inpatient appropriate because:   Awaiting skilled placement    Subjective: Awakens no distress seems comfortable Pain seems moderately controlled No fever no chills  Objective + exam Vitals:   07/11/23 1921 07/11/23 2053 07/12/23 0500 07/12/23 0523  BP: (!) 114/45 (!) 118/48  (!) 152/63  Pulse: 80 76  72  Resp: 16 17  17   Temp: 99.1 F (37.3 C) 99.1 F (37.3 C)  99.3 F (37.4 C)  TempSrc: Oral Oral  Oral  SpO2: 98% 99%  98%   Weight:   57.4 kg   Height:       Filed Weights   07/10/23 1416 07/11/23 0500 07/12/23 0500  Weight: 52.6 kg 57 kg 57.4 kg    Examination:  Pleasant awake coherent white female looks about stated age Chest is clear no wheeze rales rhonchi I took off her oxygen and she did not desaturate Neck is soft supple No rales no rhonchi Abdomen is soft no rebound Right hip covered with occlusive bandage and staples intact with no bleeding or hematoma-ROM to RLE is a little restricted secondary to pain  Data Reviewed: reviewed   CBC    Component Value Date/Time   WBC 11.1 (H) 07/10/2023 0445   RBC 3.19 (L) 07/10/2023 0445   HGB 7.4 (L) 07/11/2023 0807   HCT 24.7 (L) 07/11/2023 0807   PLT 198 07/10/2023 0445   MCV 93.1 07/10/2023 0445   MCH 28.5 07/10/2023 0445   MCHC 30.6 07/10/2023 0445   RDW 13.9 07/10/2023 0445   LYMPHSABS 0.6 (L) 07/10/2023 0445   MONOABS 0.6 07/10/2023 0445   EOSABS 0.1 07/10/2023 0445   BASOSABS 0.0 07/10/2023 0445      Latest Ref Rng & Units 07/10/2023    4:45 AM 07/09/2023   11:20 PM 07/29/2021    9:50 AM  CMP  Glucose 70 - 99 mg/dL 696  295  284   BUN 8 - 23 mg/dL 10  12  13    Creatinine  0.44 - 1.00 mg/dL 2.13  0.86  5.78   Sodium 135 - 145 mmol/L 136  139  139   Potassium 3.5 - 5.1 mmol/L 3.1  4.0  3.4   Chloride 98 - 111 mmol/L 96  98  99   CO2 22 - 32 mmol/L 34  34  30   Calcium 8.9 - 10.3 mg/dL 8.8  9.1  8.8   Total Protein 6.5 - 8.1 g/dL 6.1     Total Bilirubin 0.3 - 1.2 mg/dL 0.7     Alkaline Phos 38 - 126 U/L 64     AST 15 - 41 U/L 17     ALT 0 - 44 U/L 16        Scheduled Meds:  aspirin EC  81 mg Oral Daily   docusate sodium  100 mg Oral BID   fluticasone furoate-vilanterol  1 puff Inhalation Daily   And   umeclidinium bromide  1 puff Inhalation Daily   irbesartan  37.5 mg Oral Daily   polyethylene glycol  17 g Oral BID   Continuous Infusions:  Time  27  Rhetta Mura, MD  [facility]

## 2023-07-12 NOTE — TOC Progression Note (Signed)
Transition of Care [facility]) - Progression Note   Patient Details  Name: Chloe Wilcox MRN: [id number] Date of Birth: [birth year]  Transition of Care [facility]) CM/SW Contact  Ewing Schlein, LCSW Phone Number: 07/12/2023, 2:30 PM  Clinical Narrative: CSW provided patient and family with bed offers with Medicare star ratings. Family chose [facility]. CSW confirmed bed with [facility] in admissions and the facility can accept the patient tomorrow. CSW completed insurance authorization in [facility] portal. Reference # is: [id number]. Patient has been approved for 07/13/2023-07/17/2023. CSW updated granddaughter, Jori Moll.  Expected Discharge Plan: Skilled Nursing Facility Barriers to Discharge: SNF Pending bed offer, Insurance Authorization  Expected Discharge Plan and Services In-house Referral: Clinical Social Work Post Acute Care Choice: Skilled Nursing Facility Living arrangements for the past 2 months: Single Family Home  Social Determinants of Health (SDOH) Interventions SDOH Screenings   Food Insecurity: No Food Insecurity (07/10/2023)  Housing: Low Risk  (07/10/2023)  Transportation Needs: No Transportation Needs (07/10/2023)  Utilities: Not At Risk (07/10/2023)  Tobacco Use: Medium Risk (07/10/2023)   Readmission Risk Interventions     No data to display

## 2023-07-12 NOTE — Progress Notes (Signed)
PATIENT ID: Chloe Wilcox  MRN: [id number]  DOB/AGE:  02-Aug-1935 / 88 y.o.  2 Days Post-Op Procedure(s) (LRB): INTRAMEDULLARY (IM) NAIL INTERTROCHANTERIC (Right)  Subjective: Patient reports mild discomfort in the right hip. Reports that she was not able to get up with therapy yet   Objective: Vital signs in last 24 hours: Temp:  [97.8 F (36.6 C)-99.5 F (37.5 C)] 97.8 F (36.6 C) (08/14 1017) Pulse Rate:  [72-85] 76 (08/14 1017) Resp:  [16-20] 17 (08/14 1017) BP: (107-152)/(45-63) 147/61 (08/14 1017) SpO2:  [92 %-99 %] 92 % (08/14 1017) Weight:  [57.4 kg] 57.4 kg (08/14 0500)  Intake/Output from previous day: 08/13 0701 - 08/14 0700 In: 1500.1 [P.O.:1020; Blood:428; IV Piggyback:52.1] Out: 900 [Urine:900] Intake/Output this shift: No intake/output data recorded.  Recent Labs    07/09/23 2320 07/10/23 0445 07/11/23 0807 07/12/23 0859  HGB 9.8* 9.1* 7.4* 9.4*   Recent Labs    07/10/23 0445 07/11/23 0807 07/12/23 0859  WBC 11.1*  --  10.6*  RBC 3.19*  --  3.29*  HCT 29.7* 24.7* 30.7*  PLT 198  --  149*   Recent Labs    07/10/23 0445 07/12/23 0859  NA 136 137  K 3.1* 3.6  CL 96* 97*  CO2 34* 31  BUN 10 10  CREATININE 0.46 0.52  GLUCOSE 129* 107*  CALCIUM 8.8* 8.7*   Recent Labs    07/10/23 0445  INR 1.2    Physical Exam: Neurologically intact Intact pulses distally Dorsiflexion/Plantar flexion intact Incision: dressing C/D/I No cellulitis present Compartment soft  Assessment/Plan: 2 Days Post-Op Procedure(s) (LRB): INTRAMEDULLARY (IM) NAIL INTERTROCHANTERIC (Right)   Advance diet Up with therapy Weight Bearing as Tolerated (WBAT) VTE prophylaxis:  aspirin 81mg  daily Follow up in office with ortho in 2 weeks.     L. Porterfield, PA-C 07/12/2023, 1:15 PM

## 2023-07-12 NOTE — Progress Notes (Signed)
Physical Therapy Treatment Patient Details Name: Chloe Wilcox MRN: [id number] DOB: [birth year] Today's Date: 07/12/2023   History of Present Illness 88 yo female admitted 07/09/23 after  a fall and sustained  a left Intertrochanteric hip fracture. S/P IM nail right  on 07/10/23.WUJ:WJXB, HTN, depression    PT Comments  Pt is progressing well this session; cooperative and able to follow one step commands consistently; +2 assist needed however decr assist required in all areas of functional mobility. Able to stand and transfer  to chair via stand step pivot. D/c plan remains appropriate   If plan is discharge home, recommend the following: Two people to help with walking and/or transfers;A lot of help with bathing/dressing/bathroom;Assistance with cooking/housework;Help with stairs or ramp for entrance;Direct supervision/assist for financial management;Direct supervision/assist for medications management;Supervision due to cognitive status   Can travel by private vehicle     No  Equipment Recommendations  Rolling walker (2 wheels)    Recommendations for Other Services       Precautions / Restrictions Precautions Precautions: Fall Restrictions Weight Bearing Restrictions: No RLE Weight Bearing: Weight bearing as tolerated     Mobility  Bed Mobility Overal bed mobility: Needs Assistance Bed Mobility: Supine to Sit     Supine to sit: Mod assist, +2 for physical assistance, +2 for safety/equipment     General bed mobility comments: assisted patient to move legs to bed dge, a assisted with trunk to sit upright and to scoot forward. patient did self assist scooting a little to bed edge using UE's with cues    Transfers Overall transfer level: Needs assistance Equipment used: Rolling walker (2 wheels) Transfers: Sit to/from Stand, Bed to chair/wheelchair/BSC Sit to Stand: Mod assist, +2 safety/equipment, +2 physical assistance, From elevated surface   Step pivot transfers: Mod  assist, +2 physical assistance, +2 safety/equipment       General transfer comment: assist to rise and transition to RW. assist to maintain COG over BOS in standing; cues for hand and LE position; cues for sequence, to incr wt on RLE for pivot    Ambulation/Gait                   Stairs             Wheelchair Mobility     Tilt Bed    Modified Rankin (Stroke Patients Only)       Balance                                            Cognition Arousal: Alert Behavior During Therapy: WFL for tasks assessed/performed Overall Cognitive Status: History of cognitive impairments - at baseline                                 General Comments: Grand-daughter endorses age-related memory loss but not severe. Able to follow simple directions        Exercises General Exercises - Lower Extremity Ankle Circles/Pumps: AROM, Both, 5 reps    General Comments        Pertinent Vitals/Pain Pain Assessment Pain Assessment: Faces Faces Pain Scale: Hurts little more Pain Location: R hip Pain Descriptors / Indicators: Grimacing, Guarding Pain Intervention(s): Limited activity within patient's tolerance, Monitored during session, Premedicated before session, Repositioned    Home Living  Prior Function            PT Goals (current goals can now be found in the care plan section) Acute Rehab PT Goals Patient Stated Goal: to return home PT Goal Formulation: With patient/family Time For Goal Achievement: 07/25/23 Potential to Achieve Goals: Fair Progress towards PT goals: Progressing toward goals    Frequency    Min 1X/week      PT Plan      Co-evaluation              AM-PAC PT "6 Clicks" Mobility   Outcome Measure  Help needed turning from your back to your side while in a flat bed without using bedrails?: Total Help needed moving from lying on your back to sitting on the side of a  flat bed without using bedrails?: Total Help needed moving to and from a bed to a chair (including a wheelchair)?: Total Help needed standing up from a chair using your arms (e.g., wheelchair or bedside chair)?: Total Help needed to walk in hospital room?: Total Help needed climbing 3-5 steps with a railing? : Total 6 Click Score: 6    End of Session Equipment Utilized During Treatment: Gait belt Activity Tolerance: Patient tolerated treatment well Patient left: with call bell/phone within reach;in chair;with chair alarm set;with family/visitor present Nurse Communication: Mobility status PT Visit Diagnosis: Unsteadiness on feet (R26.81);History of falling (Z91.81);Muscle weakness (generalized) (M62.81);Difficulty in walking, not elsewhere classified (R26.2);Pain Pain - Right/Left: Right Pain - part of body: Hip     Time: 4098-1191 PT Time Calculation (min) (ACUTE ONLY): 19 min  Charges:    $Gait Training: 8-22 mins PT General Charges $$ ACUTE PT VISIT: 1 Visit                     , PT  Acute Rehab Dept [facility]) [phone]  07/12/2023    [facility] 07/12/2023, 5:24 PM

## 2023-07-13 DIAGNOSIS — Z86718 Personal history of other venous thrombosis and embolism: Secondary | ICD-10-CM | POA: Diagnosis not present

## 2023-07-13 DIAGNOSIS — M170 Bilateral primary osteoarthritis of knee: Secondary | ICD-10-CM | POA: Diagnosis not present

## 2023-07-13 DIAGNOSIS — M1711 Unilateral primary osteoarthritis, right knee: Secondary | ICD-10-CM | POA: Diagnosis not present

## 2023-07-13 DIAGNOSIS — W19XXXA Unspecified fall, initial encounter: Secondary | ICD-10-CM | POA: Diagnosis not present

## 2023-07-13 DIAGNOSIS — D509 Iron deficiency anemia, unspecified: Secondary | ICD-10-CM | POA: Diagnosis not present

## 2023-07-13 DIAGNOSIS — M1611 Unilateral primary osteoarthritis, right hip: Secondary | ICD-10-CM | POA: Diagnosis not present

## 2023-07-13 DIAGNOSIS — Z7401 Bed confinement status: Secondary | ICD-10-CM | POA: Diagnosis not present

## 2023-07-13 DIAGNOSIS — S72141D Displaced intertrochanteric fracture of right femur, subsequent encounter for closed fracture with routine healing: Secondary | ICD-10-CM | POA: Diagnosis not present

## 2023-07-13 DIAGNOSIS — I10 Essential (primary) hypertension: Secondary | ICD-10-CM | POA: Diagnosis not present

## 2023-07-13 DIAGNOSIS — M6281 Muscle weakness (generalized): Secondary | ICD-10-CM | POA: Diagnosis not present

## 2023-07-13 DIAGNOSIS — E559 Vitamin D deficiency, unspecified: Secondary | ICD-10-CM | POA: Diagnosis not present

## 2023-07-13 DIAGNOSIS — S72141A Displaced intertrochanteric fracture of right femur, initial encounter for closed fracture: Secondary | ICD-10-CM | POA: Diagnosis not present

## 2023-07-13 DIAGNOSIS — Z743 Need for continuous supervision: Secondary | ICD-10-CM | POA: Diagnosis not present

## 2023-07-13 DIAGNOSIS — R531 Weakness: Secondary | ICD-10-CM | POA: Diagnosis not present

## 2023-07-13 DIAGNOSIS — S72091D Other fracture of head and neck of right femur, subsequent encounter for closed fracture with routine healing: Secondary | ICD-10-CM | POA: Diagnosis not present

## 2023-07-13 DIAGNOSIS — R6889 Other general symptoms and signs: Secondary | ICD-10-CM | POA: Diagnosis not present

## 2023-07-13 DIAGNOSIS — R2681 Unsteadiness on feet: Secondary | ICD-10-CM | POA: Diagnosis not present

## 2023-07-13 DIAGNOSIS — Z9181 History of falling: Secondary | ICD-10-CM | POA: Diagnosis not present

## 2023-07-13 DIAGNOSIS — R2689 Other abnormalities of gait and mobility: Secondary | ICD-10-CM | POA: Diagnosis not present

## 2023-07-13 DIAGNOSIS — S79929A Unspecified injury of unspecified thigh, initial encounter: Secondary | ICD-10-CM | POA: Diagnosis not present

## 2023-07-13 DIAGNOSIS — N3000 Acute cystitis without hematuria: Secondary | ICD-10-CM | POA: Diagnosis not present

## 2023-07-13 DIAGNOSIS — J449 Chronic obstructive pulmonary disease, unspecified: Secondary | ICD-10-CM | POA: Diagnosis not present

## 2023-07-13 DIAGNOSIS — Z4781 Encounter for orthopedic aftercare following surgical amputation: Secondary | ICD-10-CM | POA: Diagnosis not present

## 2023-07-13 DIAGNOSIS — E440 Moderate protein-calorie malnutrition: Secondary | ICD-10-CM | POA: Diagnosis not present

## 2023-07-13 DIAGNOSIS — J9692 Respiratory failure, unspecified with hypercapnia: Secondary | ICD-10-CM | POA: Diagnosis not present

## 2023-07-13 DIAGNOSIS — E119 Type 2 diabetes mellitus without complications: Secondary | ICD-10-CM | POA: Diagnosis not present

## 2023-07-13 DIAGNOSIS — Z4789 Encounter for other orthopedic aftercare: Secondary | ICD-10-CM | POA: Diagnosis not present

## 2023-07-13 DIAGNOSIS — E785 Hyperlipidemia, unspecified: Secondary | ICD-10-CM | POA: Diagnosis not present

## 2023-07-13 DIAGNOSIS — D464 Refractory anemia, unspecified: Secondary | ICD-10-CM | POA: Diagnosis not present

## 2023-07-13 DIAGNOSIS — R278 Other lack of coordination: Secondary | ICD-10-CM | POA: Diagnosis not present

## 2023-07-13 DIAGNOSIS — M1712 Unilateral primary osteoarthritis, left knee: Secondary | ICD-10-CM | POA: Diagnosis not present

## 2023-07-13 MED ORDER — DOCUSATE SODIUM 100 MG PO CAPS: 100.0000 mg | ORAL_CAPSULE | Freq: Two times a day (BID) | ORAL | Status: DC

## 2023-07-13 MED ORDER — CITALOPRAM HYDROBROMIDE 10 MG PO TABS: 10.0000 mg | ORAL_TABLET | Freq: Every day | ORAL | 0 refills | Status: AC

## 2023-07-13 NOTE — Discharge Summary (Addendum)
Physician Discharge Summary  Chloe Wilcox GNF:[id number] DOB: 01-Sep-1935 DOA: 07/09/2023  PCP: Noberto Retort, MD  Admit date: 07/09/2023 Discharge date: 07/13/2023  Time spent: 37 minutes  Recommendations for Outpatient Follow-up:  Recommend Chem-7, CBC 2 to 4 days at skilled facility--- consider checking iron stores in the outpatient setting in about a month time May be WBAT as per Dr. Marilynne Drivers him-needs outpatient follow-up around the end of August for routine wound check etc. x-rays at office with [facility] Aspirin 81 daily new meds DVT prophylaxis expect about 1 month duration ending 08/13/2023 Note discontinuation this hospitalization tizanidine, amlodipine as well as Lasix and may need to resume Lasix in the outpatient setting if felt necessary based on evaluation etc.  Discharge Diagnoses:  MAIN problem for hospitalization   Closed intertrochanteric right femoral fracture Presumed COPD former smoker IDA HTN well-controlled Depression  Please see below for itemized issues addressed in HOpsital- refer to other progress notes for clarity if needed  Discharge Condition: Improved  Diet recommendation: Heart healthy  Filed Weights   07/11/23 0500 07/12/23 0500 07/13/23 0459  Weight: 57 kg 57.4 kg 56.3 kg    History of present illness:   88 year old white female Known history of COPD hospitalized in 2020 and 2021-is on chronic oxygen HTN depression Ex-smoker Brought to ED 8/11 secondary to mechanical fall and found to have acute R intertrochanteric hip fracture   8/12 Dr. Ave Filter performed IM nail right hip 8/13 1 unit PRBC transfused  Hospital Course:  Close displaced intertrochanteric right femoral hip fracture PT OT recommending skilled PSR etc. as per social work Pain seems controlled but will discontinue IV Dilaudid and placed on Tylenol 1000 scheduled as per orders add small dose of Oxy IR 5 every 4 as needed Can add back home dosage of Zanaflex  if needed DVT prophylaxis aspirin 81   Smoker previously, presumed COPD-continues to use 2 L of oxygen at baseline No wheeze Continue albuterol 2.5 every 4 as needed as well as Breo Ellipta and Incruse Ellipta as per orders   Iron deficiency anemia with acute blood loss anemia from surgery which was expected Follow lab from this morning   HTN Lasix 40/20 held Benicar has been reordered at 37.5 [avapro sub] Consider resumption amlodipine if pressures above 160   Depression Resume citalopram at a lower dose of 10 daily with the aim to completely stop this in the next several weeks if no real need and can discuss this with psychiatry    Discharge Exam: Vitals:   07/13/23 0631 07/13/23 0855  BP: (!) 130/98   Pulse: 65   Resp: 20   Temp: 97.6 F (36.4 C)   SpO2: 98% 100%    Subj on day of d/c   Awake coherent alert no distress looks well feels well has not had breakfast this morning  General Exam on discharge  EOMI NCAT no focal deficit looks about stated age mild ptosis right adnexal supple Chest clear no added sound no rales no rhonchi S1-S2 HSM LUSB Abdomen slight distention no rebound no guarding Right lower extremity has staples in place in the right hip region This area looks clean there is no bleeding There is no lower extremity edema Psych is euthymic coherent she is somewhat hard of hearing  Discharge Instructions   Discharge Instructions     Diet - low sodium heart healthy   Complete by: As directed    Increase activity slowly   Complete by: As directed  Weight bearing as tolerated   Complete by: As directed       Allergies as of 07/13/2023   No Known Allergies      Medication List     STOP taking these medications    amLODipine 10 MG tablet Commonly known as: NORVASC   diclofenac 50 MG EC tablet Commonly known as: VOLTAREN   furosemide 20 MG tablet Commonly known as: LASIX   tiZANidine 2 MG tablet Commonly known as: ZANAFLEX        TAKE these medications    acetaminophen 650 MG CR tablet Commonly known as: TYLENOL Take 650 mg by mouth every 8 (eight) hours as needed for pain (or headaches).   albuterol (2.5 MG/3ML) 0.083% nebulizer solution Commonly known as: PROVENTIL Take 3 mLs (2.5 mg total) by nebulization every 6 (six) hours as needed for wheezing or shortness of breath.   albuterol 108 (90 Base) MCG/ACT inhaler Commonly known as: VENTOLIN HFA Inhale 2 puffs into the lungs every 6 (six) hours as needed for wheezing or shortness of breath.   aspirin EC 81 MG tablet Take 81 mg by mouth daily. Swallow whole.   atorvastatin 40 MG tablet Commonly known as: LIPITOR Take 40 mg by mouth at bedtime.   Breztri Aerosphere 160-9-4.8 MCG/ACT Aero Generic drug: Budeson-Glycopyrrol-Formoterol Inhale 2 puffs into the lungs in the morning and at bedtime.   citalopram 10 MG tablet Commonly known as: CELEXA Take 1 tablet (10 mg total) by mouth daily. Start taking on: July 14, 2023 What changed:  medication strength how much to take   docusate sodium 100 MG capsule Commonly known as: COLACE Take 1 capsule (100 mg total) by mouth 2 (two) times daily.   ipratropium-albuterol 0.5-2.5 (3) MG/3ML Soln Commonly known as: DUONEB Inhale 3 mLs into the lungs in the morning and at bedtime.   olmesartan 20 MG tablet Commonly known as: BENICAR Take 10 mg by mouth at bedtime.   traMADol 50 MG tablet Commonly known as: ULTRAM Take 1 tablet (50 mg total) by mouth every 6 (six) hours as needed for moderate pain. What changed: when to take this               Discharge Care Instructions  (From admission, onward)           Start     Ordered   07/12/23 0000  Weight bearing as tolerated        07/12/23 0819           No Known Allergies  Contact information for follow-up providers     Jones Broom, MD. Schedule an appointment as soon as possible for a visit on 07/21/2023.   Specialty:  Orthopedic Surgery Contact information: [address] [place] [address] [phone]              Contact information for after-discharge care     Destination     [facility] Preferred SNF .   Service: Skilled Nursing Contact information: [address] [place] [address] [phone]                      The results of significant diagnostics from this hospitalization (including imaging, microbiology, ancillary and laboratory) are listed below for reference.    Significant Diagnostic Studies: DG HIP UNILAT WITH PELVIS 2-3 VIEWS RIGHT  Result Date: 07/10/2023 CLINICAL DATA:  Known right intratrochanteric fracture EXAM: DG HIP (WITH OR WITHOUT PELVIS) 2-3V RIGHT COMPARISON:  Film from earlier in the same day. FLUOROSCOPY TIME:  Radiation Exposure Index (as provided by the fluoroscopic device): 2.5 mGy If the device does not provide the exposure index: Fluoroscopy Time:  29 seconds Number of Acquired Images:  16 FINDINGS: Initial images again demonstrate the intratrochanteric fracture. A medullary guide was then placed with medullary rod in the proximal femur. Fixation screw was then placed traversing the femoral neck. Distal fixation screw is noted in the midshaft. Fracture fragments are in near anatomic alignment. IMPRESSION: ORIF of right intratrochanteric fracture Electronically Signed   By: Alcide Clever M.D.   On: 07/10/2023 19:35   DG C-Arm 1-60 Min-No Report  Result Date: 07/10/2023 Fluoroscopy was utilized by the requesting physician.  No radiographic interpretation.   CT Head Wo Contrast  Result Date: 07/10/2023 CLINICAL DATA:  Recent fall with headaches, initial encounter EXAM: CT HEAD WITHOUT CONTRAST TECHNIQUE: Contiguous axial images were obtained from the base of the skull through the vertex without intravenous contrast. RADIATION DOSE REDUCTION: This exam was performed according to the departmental  dose-optimization program which includes automated exposure control, adjustment of the mA and/or kV according to patient size and/or use of iterative reconstruction technique. COMPARISON:  None Available. FINDINGS: Brain: No evidence of acute infarction, hemorrhage, hydrocephalus, extra-axial collection or mass lesion/mass effect. Chronic white matter ischemic changes and atrophic changes are noted. Vascular: No hyperdense vessel or unexpected calcification. Skull: Normal. Negative for fracture or focal lesion. Sinuses/Orbits: No acute finding. Other: None. IMPRESSION: Chronic atrophic and ischemic changes without acute abnormality Electronically Signed   By: Alcide Clever M.D.   On: 07/10/2023 00:21   DG Chest 1 View  Result Date: 07/10/2023 CLINICAL DATA:  Fall EXAM: CHEST  1 VIEW COMPARISON:  02/28/2023 FINDINGS: Lungs are clear.  No pleural effusion or pneumothorax. The heart is normal in size.  Thoracic aortic atherosclerosis. IMPRESSION: No acute cardiopulmonary disease. Electronically Signed   By: Charline Bills M.D.   On: 07/10/2023 00:15   DG Hip Unilat W or Wo Pelvis 2-3 Views Right  Result Date: 07/10/2023 CLINICAL DATA:  Fall, right hip pain EXAM: DG HIP (WITH OR WITHOUT PELVIS) 2-3V RIGHT COMPARISON:  None Available. FINDINGS: Mildly comminuted, intertrochanteric right hip fracture with varus angulation and foreshortening. Cortically based sclerosis along the medial femoral shafts bilaterally, of uncertain etiology but reflecting a smooth/benign appearance. Left hip joint space is preserved. Visualized bony pelvis appears intact. Large calcified pelvic fibroid. IMPRESSION: Intertrochanteric right hip fracture, as above. Electronically Signed   By: Charline Bills M.D.   On: 07/10/2023 00:14    Microbiology: Recent Results (from the past 240 hour(s))  Surgical pcr screen     Status: None   Collection Time: 07/10/23  8:48 AM   Specimen: Nasal Mucosa; Nasal Swab  Result Value Ref Range  Status   MRSA, PCR NEGATIVE NEGATIVE Final   Staphylococcus aureus NEGATIVE NEGATIVE Final    Comment: (NOTE) The Xpert SA Assay (FDA approved for NASAL specimens in patients 20 years of age and older), is one component of a comprehensive surveillance program. It is not intended to diagnose infection nor to guide or monitor treatment. Performed at [facility], [address], [place], [place] [address]      Labs: Basic Metabolic Panel: Recent Labs  Lab 07/09/23 2320 07/10/23 0445 07/12/23 0859  NA 139 136 137  K 4.0 3.1* 3.6  CL 98 96* 97*  CO2 34* 34* 31  GLUCOSE 111* 129* 107*  BUN 12  10 10  CREATININE 0.55 0.46 0.52  CALCIUM 9.1 8.8* 8.7*  MG  --  2.0  --    Liver Function Tests: Recent Labs  Lab 07/10/23 0445  AST 17  ALT 16  ALKPHOS 64  BILITOT 0.7  PROT 6.1*  ALBUMIN 3.9   No results for input(s): "LIPASE", "AMYLASE" in the last 168 hours. No results for input(s): "AMMONIA" in the last 168 hours. CBC: Recent Labs  Lab 07/09/23 2320 07/10/23 0445 07/11/23 0807 07/12/23 0859  WBC 7.2 11.1*  --  10.6*  NEUTROABS 5.5 9.6*  --   --   HGB 9.8* 9.1* 7.4* 9.4*  HCT 32.2* 29.7* 24.7* 30.7*  MCV 92.3 93.1  --  93.3  PLT 219 198  --  149*   Cardiac Enzymes: No results for input(s): "CKTOTAL", "CKMB", "CKMBINDEX", "TROPONINI" in the last 168 hours. BNP: BNP (last 3 results) No results for input(s): "BNP" in the last 8760 hours.  ProBNP (last 3 results) No results for input(s): "PROBNP" in the last 8760 hours.  CBG: No results for input(s): "GLUCAP" in the last 168 hours.     Signed:  Rhetta Mura MD   [facility] 07/13/2023, 9:14 AM

## 2023-07-13 NOTE — TOC Transition Note (Signed)
Transition of Care [facility]) - CM/SW Discharge Note  Patient Details  Name: Chloe Wilcox MRN: [id number] Date of Birth: 1935-11-13  [facility]) CM/SW Contact:  Ewing Schlein, LCSW Phone Number: 07/13/2023, 10:20 AM  Clinical Narrative: Patient to discharge to [facility] today. Patient will go to room 507 and the number for report is [phone]. Discharge summary, discharge orders, and SNF transfer report faxed to facility in hub. Medical necessity form done; PTAR scheduled. Discharge packet completed. Patient and goddaughter notified of transportation being set up. Granddaughter to go to facility to complete admission paperwork. RN updated. TOC signing off.  Final next level of care: Skilled Nursing Facility Barriers to Discharge: Barriers Resolved  Patient Goals and CMS Choice CMS Medicare.gov Compare Post Acute Care list provided to:: Patient Represenative (must comment) [name] (daughter)) Choice offered to / list presented to : Patient, Adult Children  Discharge Placement PASRR number recieved: 07/11/23    Patient chooses bed at: [facility] Patient to be transferred to facility by: PTAR Patient and family notified of of transfer: 07/13/23  Discharge Plan and Services Additional resources added to the After Visit Summary for   In-house Referral: Clinical Social Work Post Acute Care Choice: Skilled Nursing Facility          DME Arranged: N/A DME Agency: NA  Social Determinants of Health (SDOH) Interventions SDOH Screenings   Food Insecurity: No Food Insecurity (07/10/2023)  Housing: Low Risk  (07/10/2023)  Transportation Needs: No Transportation Needs (07/10/2023)  Utilities: Not At Risk (07/10/2023)  Tobacco Use: Medium Risk (07/10/2023)   Readmission Risk Interventions     No data to display

## 2023-07-14 DIAGNOSIS — E785 Hyperlipidemia, unspecified: Secondary | ICD-10-CM | POA: Diagnosis not present

## 2023-07-14 DIAGNOSIS — S72141D Displaced intertrochanteric fracture of right femur, subsequent encounter for closed fracture with routine healing: Secondary | ICD-10-CM | POA: Diagnosis not present

## 2023-07-14 DIAGNOSIS — M6281 Muscle weakness (generalized): Secondary | ICD-10-CM | POA: Diagnosis not present

## 2023-07-17 DIAGNOSIS — M6281 Muscle weakness (generalized): Secondary | ICD-10-CM | POA: Diagnosis not present

## 2023-07-17 DIAGNOSIS — R278 Other lack of coordination: Secondary | ICD-10-CM | POA: Diagnosis not present

## 2023-07-17 DIAGNOSIS — Z9181 History of falling: Secondary | ICD-10-CM | POA: Diagnosis not present

## 2023-07-17 DIAGNOSIS — J449 Chronic obstructive pulmonary disease, unspecified: Secondary | ICD-10-CM | POA: Diagnosis not present

## 2023-07-17 DIAGNOSIS — I10 Essential (primary) hypertension: Secondary | ICD-10-CM | POA: Diagnosis not present

## 2023-07-17 DIAGNOSIS — Z4781 Encounter for orthopedic aftercare following surgical amputation: Secondary | ICD-10-CM | POA: Diagnosis not present

## 2023-07-17 DIAGNOSIS — S72141D Displaced intertrochanteric fracture of right femur, subsequent encounter for closed fracture with routine healing: Secondary | ICD-10-CM | POA: Diagnosis not present

## 2023-07-17 DIAGNOSIS — S72091D Other fracture of head and neck of right femur, subsequent encounter for closed fracture with routine healing: Secondary | ICD-10-CM | POA: Diagnosis not present

## 2023-07-20 DIAGNOSIS — M6281 Muscle weakness (generalized): Secondary | ICD-10-CM | POA: Diagnosis not present

## 2023-07-20 DIAGNOSIS — R278 Other lack of coordination: Secondary | ICD-10-CM | POA: Diagnosis not present

## 2023-07-20 DIAGNOSIS — S72091D Other fracture of head and neck of right femur, subsequent encounter for closed fracture with routine healing: Secondary | ICD-10-CM | POA: Diagnosis not present

## 2023-07-20 DIAGNOSIS — J449 Chronic obstructive pulmonary disease, unspecified: Secondary | ICD-10-CM | POA: Diagnosis not present

## 2023-07-20 DIAGNOSIS — Z9181 History of falling: Secondary | ICD-10-CM | POA: Diagnosis not present

## 2023-07-20 DIAGNOSIS — Z4781 Encounter for orthopedic aftercare following surgical amputation: Secondary | ICD-10-CM | POA: Diagnosis not present

## 2023-07-21 DIAGNOSIS — Z4789 Encounter for other orthopedic aftercare: Secondary | ICD-10-CM | POA: Diagnosis not present

## 2023-07-21 DIAGNOSIS — E785 Hyperlipidemia, unspecified: Secondary | ICD-10-CM | POA: Diagnosis not present

## 2023-07-21 DIAGNOSIS — I10 Essential (primary) hypertension: Secondary | ICD-10-CM | POA: Diagnosis not present

## 2023-07-24 DIAGNOSIS — R278 Other lack of coordination: Secondary | ICD-10-CM | POA: Diagnosis not present

## 2023-07-24 DIAGNOSIS — M6281 Muscle weakness (generalized): Secondary | ICD-10-CM | POA: Diagnosis not present

## 2023-07-24 DIAGNOSIS — Z4781 Encounter for orthopedic aftercare following surgical amputation: Secondary | ICD-10-CM | POA: Diagnosis not present

## 2023-07-24 DIAGNOSIS — S72091D Other fracture of head and neck of right femur, subsequent encounter for closed fracture with routine healing: Secondary | ICD-10-CM | POA: Diagnosis not present

## 2023-07-24 DIAGNOSIS — S72141D Displaced intertrochanteric fracture of right femur, subsequent encounter for closed fracture with routine healing: Secondary | ICD-10-CM | POA: Diagnosis not present

## 2023-07-24 DIAGNOSIS — M1712 Unilateral primary osteoarthritis, left knee: Secondary | ICD-10-CM | POA: Diagnosis not present

## 2023-07-24 DIAGNOSIS — Z9181 History of falling: Secondary | ICD-10-CM | POA: Diagnosis not present

## 2023-07-24 DIAGNOSIS — M1711 Unilateral primary osteoarthritis, right knee: Secondary | ICD-10-CM | POA: Diagnosis not present

## 2023-07-24 DIAGNOSIS — M170 Bilateral primary osteoarthritis of knee: Secondary | ICD-10-CM | POA: Diagnosis not present

## 2023-07-24 DIAGNOSIS — J449 Chronic obstructive pulmonary disease, unspecified: Secondary | ICD-10-CM | POA: Diagnosis not present

## 2023-07-25 DIAGNOSIS — Z4789 Encounter for other orthopedic aftercare: Secondary | ICD-10-CM | POA: Diagnosis not present

## 2023-07-25 DIAGNOSIS — I10 Essential (primary) hypertension: Secondary | ICD-10-CM | POA: Diagnosis not present

## 2023-07-25 DIAGNOSIS — Z86718 Personal history of other venous thrombosis and embolism: Secondary | ICD-10-CM | POA: Diagnosis not present

## 2023-07-27 DIAGNOSIS — J449 Chronic obstructive pulmonary disease, unspecified: Secondary | ICD-10-CM | POA: Diagnosis not present

## 2023-07-27 DIAGNOSIS — R278 Other lack of coordination: Secondary | ICD-10-CM | POA: Diagnosis not present

## 2023-07-27 DIAGNOSIS — Z9181 History of falling: Secondary | ICD-10-CM | POA: Diagnosis not present

## 2023-07-27 DIAGNOSIS — S72091D Other fracture of head and neck of right femur, subsequent encounter for closed fracture with routine healing: Secondary | ICD-10-CM | POA: Diagnosis not present

## 2023-07-27 DIAGNOSIS — Z4781 Encounter for orthopedic aftercare following surgical amputation: Secondary | ICD-10-CM | POA: Diagnosis not present

## 2023-07-27 DIAGNOSIS — M6281 Muscle weakness (generalized): Secondary | ICD-10-CM | POA: Diagnosis not present

## 2023-07-31 DIAGNOSIS — S72141D Displaced intertrochanteric fracture of right femur, subsequent encounter for closed fracture with routine healing: Secondary | ICD-10-CM | POA: Diagnosis not present

## 2023-07-31 DIAGNOSIS — D464 Refractory anemia, unspecified: Secondary | ICD-10-CM | POA: Diagnosis not present

## 2023-07-31 DIAGNOSIS — J449 Chronic obstructive pulmonary disease, unspecified: Secondary | ICD-10-CM | POA: Diagnosis not present

## 2023-08-02 ENCOUNTER — Other Ambulatory Visit: Payer: Self-pay | Admitting: *Deleted

## 2023-08-02 NOTE — Patient Outreach (Signed)
Mrs. Virrueta resides in [facility] skilled nursing facility. Screening for potential care coordination services as benefit of health plan and Primary Care Provider.   Collaboration with [facility] psychotherapist. Anticipated transition plan is to return home with family soon. Family recently appealed discharge.  PCP office is [facility].  Will follow for transition date.   Raiford Noble, MSN, RN,BSN Post Acute Care Coordinator [phone] (Direct dial)

## 2023-08-03 DIAGNOSIS — M6281 Muscle weakness (generalized): Secondary | ICD-10-CM | POA: Diagnosis not present

## 2023-08-03 DIAGNOSIS — Z4781 Encounter for orthopedic aftercare following surgical amputation: Secondary | ICD-10-CM | POA: Diagnosis not present

## 2023-08-03 DIAGNOSIS — S72091D Other fracture of head and neck of right femur, subsequent encounter for closed fracture with routine healing: Secondary | ICD-10-CM | POA: Diagnosis not present

## 2023-08-03 DIAGNOSIS — R278 Other lack of coordination: Secondary | ICD-10-CM | POA: Diagnosis not present

## 2023-08-03 DIAGNOSIS — J449 Chronic obstructive pulmonary disease, unspecified: Secondary | ICD-10-CM | POA: Diagnosis not present

## 2023-08-03 DIAGNOSIS — Z9181 History of falling: Secondary | ICD-10-CM | POA: Diagnosis not present

## 2023-08-04 DIAGNOSIS — E440 Moderate protein-calorie malnutrition: Secondary | ICD-10-CM | POA: Diagnosis not present

## 2023-08-07 DIAGNOSIS — Z4781 Encounter for orthopedic aftercare following surgical amputation: Secondary | ICD-10-CM | POA: Diagnosis not present

## 2023-08-07 DIAGNOSIS — R278 Other lack of coordination: Secondary | ICD-10-CM | POA: Diagnosis not present

## 2023-08-07 DIAGNOSIS — S72091D Other fracture of head and neck of right femur, subsequent encounter for closed fracture with routine healing: Secondary | ICD-10-CM | POA: Diagnosis not present

## 2023-08-07 DIAGNOSIS — J449 Chronic obstructive pulmonary disease, unspecified: Secondary | ICD-10-CM | POA: Diagnosis not present

## 2023-08-07 DIAGNOSIS — M6281 Muscle weakness (generalized): Secondary | ICD-10-CM | POA: Diagnosis not present

## 2023-08-07 DIAGNOSIS — Z9181 History of falling: Secondary | ICD-10-CM | POA: Diagnosis not present

## 2023-08-10 DIAGNOSIS — Z4781 Encounter for orthopedic aftercare following surgical amputation: Secondary | ICD-10-CM | POA: Diagnosis not present

## 2023-08-10 DIAGNOSIS — M6281 Muscle weakness (generalized): Secondary | ICD-10-CM | POA: Diagnosis not present

## 2023-08-10 DIAGNOSIS — S72091D Other fracture of head and neck of right femur, subsequent encounter for closed fracture with routine healing: Secondary | ICD-10-CM | POA: Diagnosis not present

## 2023-08-10 DIAGNOSIS — Z9181 History of falling: Secondary | ICD-10-CM | POA: Diagnosis not present

## 2023-08-10 DIAGNOSIS — J449 Chronic obstructive pulmonary disease, unspecified: Secondary | ICD-10-CM | POA: Diagnosis not present

## 2023-08-10 DIAGNOSIS — R278 Other lack of coordination: Secondary | ICD-10-CM | POA: Diagnosis not present

## 2023-08-12 DIAGNOSIS — J449 Chronic obstructive pulmonary disease, unspecified: Secondary | ICD-10-CM | POA: Diagnosis not present

## 2023-08-14 DIAGNOSIS — S72091D Other fracture of head and neck of right femur, subsequent encounter for closed fracture with routine healing: Secondary | ICD-10-CM | POA: Diagnosis not present

## 2023-08-14 DIAGNOSIS — Z9181 History of falling: Secondary | ICD-10-CM | POA: Diagnosis not present

## 2023-08-14 DIAGNOSIS — Z4781 Encounter for orthopedic aftercare following surgical amputation: Secondary | ICD-10-CM | POA: Diagnosis not present

## 2023-08-14 DIAGNOSIS — R278 Other lack of coordination: Secondary | ICD-10-CM | POA: Diagnosis not present

## 2023-08-14 DIAGNOSIS — J449 Chronic obstructive pulmonary disease, unspecified: Secondary | ICD-10-CM | POA: Diagnosis not present

## 2023-08-14 DIAGNOSIS — M6281 Muscle weakness (generalized): Secondary | ICD-10-CM | POA: Diagnosis not present

## 2023-08-16 ENCOUNTER — Other Ambulatory Visit: Payer: Self-pay | Admitting: *Deleted

## 2023-08-16 NOTE — Patient Outreach (Signed)
Chloe Wilcox resides in [facility] skilled nursing facility. Screening for potential care coordination/ chronic care management services as a benefit of health plan and primary care provider.  Collaboration with [facility] psychotherapist. Chloe Wilcox last covered date is 08/17/23. Plans are to return home with family.   PCP office [facility] has care management services.  Chloe Noble, MSN, RN, BSN [place]  [facility], [facility] RN Post- Acute Care Coordinator Direct Dial: [phone]

## 2023-08-17 DIAGNOSIS — M6281 Muscle weakness (generalized): Secondary | ICD-10-CM | POA: Diagnosis not present

## 2023-08-17 DIAGNOSIS — Z4781 Encounter for orthopedic aftercare following surgical amputation: Secondary | ICD-10-CM | POA: Diagnosis not present

## 2023-08-17 DIAGNOSIS — R278 Other lack of coordination: Secondary | ICD-10-CM | POA: Diagnosis not present

## 2023-08-17 DIAGNOSIS — Z9181 History of falling: Secondary | ICD-10-CM | POA: Diagnosis not present

## 2023-08-17 DIAGNOSIS — S72091D Other fracture of head and neck of right femur, subsequent encounter for closed fracture with routine healing: Secondary | ICD-10-CM | POA: Diagnosis not present

## 2023-08-17 DIAGNOSIS — J449 Chronic obstructive pulmonary disease, unspecified: Secondary | ICD-10-CM | POA: Diagnosis not present

## 2023-08-18 DIAGNOSIS — S72141D Displaced intertrochanteric fracture of right femur, subsequent encounter for closed fracture with routine healing: Secondary | ICD-10-CM | POA: Diagnosis not present

## 2023-08-18 DIAGNOSIS — E559 Vitamin D deficiency, unspecified: Secondary | ICD-10-CM | POA: Diagnosis not present

## 2023-08-18 DIAGNOSIS — I10 Essential (primary) hypertension: Secondary | ICD-10-CM | POA: Diagnosis not present

## 2023-08-18 DIAGNOSIS — E119 Type 2 diabetes mellitus without complications: Secondary | ICD-10-CM | POA: Diagnosis not present

## 2023-08-18 DIAGNOSIS — J449 Chronic obstructive pulmonary disease, unspecified: Secondary | ICD-10-CM | POA: Diagnosis not present

## 2023-08-21 DIAGNOSIS — M1611 Unilateral primary osteoarthritis, right hip: Secondary | ICD-10-CM | POA: Diagnosis not present

## 2023-08-21 DIAGNOSIS — Z9181 History of falling: Secondary | ICD-10-CM | POA: Diagnosis not present

## 2023-08-21 DIAGNOSIS — J449 Chronic obstructive pulmonary disease, unspecified: Secondary | ICD-10-CM | POA: Diagnosis not present

## 2023-08-21 DIAGNOSIS — Z4781 Encounter for orthopedic aftercare following surgical amputation: Secondary | ICD-10-CM | POA: Diagnosis not present

## 2023-08-21 DIAGNOSIS — R278 Other lack of coordination: Secondary | ICD-10-CM | POA: Diagnosis not present

## 2023-08-21 DIAGNOSIS — S72091D Other fracture of head and neck of right femur, subsequent encounter for closed fracture with routine healing: Secondary | ICD-10-CM | POA: Diagnosis not present

## 2023-08-21 DIAGNOSIS — M6281 Muscle weakness (generalized): Secondary | ICD-10-CM | POA: Diagnosis not present

## 2023-08-24 DIAGNOSIS — Z9181 History of falling: Secondary | ICD-10-CM | POA: Diagnosis not present

## 2023-08-24 DIAGNOSIS — S72091D Other fracture of head and neck of right femur, subsequent encounter for closed fracture with routine healing: Secondary | ICD-10-CM | POA: Diagnosis not present

## 2023-08-24 DIAGNOSIS — M6281 Muscle weakness (generalized): Secondary | ICD-10-CM | POA: Diagnosis not present

## 2023-08-24 DIAGNOSIS — Z4781 Encounter for orthopedic aftercare following surgical amputation: Secondary | ICD-10-CM | POA: Diagnosis not present

## 2023-08-24 DIAGNOSIS — R278 Other lack of coordination: Secondary | ICD-10-CM | POA: Diagnosis not present

## 2023-08-24 DIAGNOSIS — J449 Chronic obstructive pulmonary disease, unspecified: Secondary | ICD-10-CM | POA: Diagnosis not present

## 2023-08-28 DIAGNOSIS — J449 Chronic obstructive pulmonary disease, unspecified: Secondary | ICD-10-CM | POA: Diagnosis not present

## 2023-08-28 DIAGNOSIS — Z4781 Encounter for orthopedic aftercare following surgical amputation: Secondary | ICD-10-CM | POA: Diagnosis not present

## 2023-08-28 DIAGNOSIS — Z9181 History of falling: Secondary | ICD-10-CM | POA: Diagnosis not present

## 2023-08-28 DIAGNOSIS — R278 Other lack of coordination: Secondary | ICD-10-CM | POA: Diagnosis not present

## 2023-08-28 DIAGNOSIS — M6281 Muscle weakness (generalized): Secondary | ICD-10-CM | POA: Diagnosis not present

## 2023-08-28 DIAGNOSIS — S72091D Other fracture of head and neck of right femur, subsequent encounter for closed fracture with routine healing: Secondary | ICD-10-CM | POA: Diagnosis not present

## 2023-08-30 ENCOUNTER — Other Ambulatory Visit: Payer: Self-pay | Admitting: *Deleted

## 2023-08-30 NOTE — Patient Outreach (Signed)
Mrs. Mussell resides in  [facility] skilled nursing facility.  Screening for potential care coordination/ chronic care management services as a benefit of health plan and primary care provider.  Collaboration visit with [facility] social worker. Wilkie Aye reports Mrs. Wartman has won the last several appeals for discharge. Reports last covered day is now issued for 09/01/23. Plan is to transition home with granddaughter. Reports caregiver training meeting has been scheduled.   PCP office [facility] has care management services.   Will send secure communication to [facility] CM upon SNF discharge.    Raiford Noble, MSN, RN, BSN [place]  [facility], [facility] RN Post- Acute Care Coordinator Direct Dial: [phone]

## 2023-08-31 DIAGNOSIS — J449 Chronic obstructive pulmonary disease, unspecified: Secondary | ICD-10-CM | POA: Diagnosis not present

## 2023-08-31 DIAGNOSIS — S72091D Other fracture of head and neck of right femur, subsequent encounter for closed fracture with routine healing: Secondary | ICD-10-CM | POA: Diagnosis not present

## 2023-08-31 DIAGNOSIS — R278 Other lack of coordination: Secondary | ICD-10-CM | POA: Diagnosis not present

## 2023-08-31 DIAGNOSIS — Z4781 Encounter for orthopedic aftercare following surgical amputation: Secondary | ICD-10-CM | POA: Diagnosis not present

## 2023-08-31 DIAGNOSIS — M6281 Muscle weakness (generalized): Secondary | ICD-10-CM | POA: Diagnosis not present

## 2023-08-31 DIAGNOSIS — Z9181 History of falling: Secondary | ICD-10-CM | POA: Diagnosis not present

## 2023-09-01 DIAGNOSIS — W19XXXA Unspecified fall, initial encounter: Secondary | ICD-10-CM | POA: Diagnosis not present

## 2023-09-01 DIAGNOSIS — N3000 Acute cystitis without hematuria: Secondary | ICD-10-CM | POA: Diagnosis not present

## 2023-09-01 DIAGNOSIS — S72141D Displaced intertrochanteric fracture of right femur, subsequent encounter for closed fracture with routine healing: Secondary | ICD-10-CM | POA: Diagnosis not present

## 2023-09-04 DIAGNOSIS — E559 Vitamin D deficiency, unspecified: Secondary | ICD-10-CM | POA: Diagnosis not present

## 2023-09-04 DIAGNOSIS — I10 Essential (primary) hypertension: Secondary | ICD-10-CM | POA: Diagnosis not present

## 2023-09-04 DIAGNOSIS — D464 Refractory anemia, unspecified: Secondary | ICD-10-CM | POA: Diagnosis not present

## 2023-09-04 DIAGNOSIS — M6281 Muscle weakness (generalized): Secondary | ICD-10-CM | POA: Diagnosis not present

## 2023-09-04 DIAGNOSIS — J9692 Respiratory failure, unspecified with hypercapnia: Secondary | ICD-10-CM | POA: Diagnosis not present

## 2023-09-04 DIAGNOSIS — J449 Chronic obstructive pulmonary disease, unspecified: Secondary | ICD-10-CM | POA: Diagnosis not present

## 2023-09-04 DIAGNOSIS — R278 Other lack of coordination: Secondary | ICD-10-CM | POA: Diagnosis not present

## 2023-09-04 DIAGNOSIS — Z4781 Encounter for orthopedic aftercare following surgical amputation: Secondary | ICD-10-CM | POA: Diagnosis not present

## 2023-09-04 DIAGNOSIS — Z9181 History of falling: Secondary | ICD-10-CM | POA: Diagnosis not present

## 2023-09-04 DIAGNOSIS — S72091D Other fracture of head and neck of right femur, subsequent encounter for closed fracture with routine healing: Secondary | ICD-10-CM | POA: Diagnosis not present

## 2023-09-07 DIAGNOSIS — S72091D Other fracture of head and neck of right femur, subsequent encounter for closed fracture with routine healing: Secondary | ICD-10-CM | POA: Diagnosis not present

## 2023-09-07 DIAGNOSIS — R278 Other lack of coordination: Secondary | ICD-10-CM | POA: Diagnosis not present

## 2023-09-07 DIAGNOSIS — J449 Chronic obstructive pulmonary disease, unspecified: Secondary | ICD-10-CM | POA: Diagnosis not present

## 2023-09-07 DIAGNOSIS — Z4781 Encounter for orthopedic aftercare following surgical amputation: Secondary | ICD-10-CM | POA: Diagnosis not present

## 2023-09-07 DIAGNOSIS — Z9181 History of falling: Secondary | ICD-10-CM | POA: Diagnosis not present

## 2023-09-07 DIAGNOSIS — M6281 Muscle weakness (generalized): Secondary | ICD-10-CM | POA: Diagnosis not present

## 2023-09-08 DIAGNOSIS — M6281 Muscle weakness (generalized): Secondary | ICD-10-CM | POA: Diagnosis not present

## 2023-09-08 DIAGNOSIS — R2689 Other abnormalities of gait and mobility: Secondary | ICD-10-CM | POA: Diagnosis not present

## 2023-09-08 DIAGNOSIS — S72141D Displaced intertrochanteric fracture of right femur, subsequent encounter for closed fracture with routine healing: Secondary | ICD-10-CM | POA: Diagnosis not present

## 2023-09-08 DIAGNOSIS — R2681 Unsteadiness on feet: Secondary | ICD-10-CM | POA: Diagnosis not present

## 2023-09-09 DIAGNOSIS — R2689 Other abnormalities of gait and mobility: Secondary | ICD-10-CM | POA: Diagnosis not present

## 2023-09-09 DIAGNOSIS — S72141D Displaced intertrochanteric fracture of right femur, subsequent encounter for closed fracture with routine healing: Secondary | ICD-10-CM | POA: Diagnosis not present

## 2023-09-09 DIAGNOSIS — R2681 Unsteadiness on feet: Secondary | ICD-10-CM | POA: Diagnosis not present

## 2023-09-09 DIAGNOSIS — M6281 Muscle weakness (generalized): Secondary | ICD-10-CM | POA: Diagnosis not present

## 2023-09-10 DIAGNOSIS — S72141D Displaced intertrochanteric fracture of right femur, subsequent encounter for closed fracture with routine healing: Secondary | ICD-10-CM | POA: Diagnosis not present

## 2023-09-10 DIAGNOSIS — M6281 Muscle weakness (generalized): Secondary | ICD-10-CM | POA: Diagnosis not present

## 2023-09-10 DIAGNOSIS — R2689 Other abnormalities of gait and mobility: Secondary | ICD-10-CM | POA: Diagnosis not present

## 2023-09-10 DIAGNOSIS — R2681 Unsteadiness on feet: Secondary | ICD-10-CM | POA: Diagnosis not present

## 2023-09-11 DIAGNOSIS — M6281 Muscle weakness (generalized): Secondary | ICD-10-CM | POA: Diagnosis not present

## 2023-09-11 DIAGNOSIS — R2689 Other abnormalities of gait and mobility: Secondary | ICD-10-CM | POA: Diagnosis not present

## 2023-09-11 DIAGNOSIS — J449 Chronic obstructive pulmonary disease, unspecified: Secondary | ICD-10-CM | POA: Diagnosis not present

## 2023-09-11 DIAGNOSIS — R278 Other lack of coordination: Secondary | ICD-10-CM | POA: Diagnosis not present

## 2023-09-11 DIAGNOSIS — Z4781 Encounter for orthopedic aftercare following surgical amputation: Secondary | ICD-10-CM | POA: Diagnosis not present

## 2023-09-11 DIAGNOSIS — J9692 Respiratory failure, unspecified with hypercapnia: Secondary | ICD-10-CM | POA: Diagnosis not present

## 2023-09-11 DIAGNOSIS — E559 Vitamin D deficiency, unspecified: Secondary | ICD-10-CM | POA: Diagnosis not present

## 2023-09-11 DIAGNOSIS — S72091D Other fracture of head and neck of right femur, subsequent encounter for closed fracture with routine healing: Secondary | ICD-10-CM | POA: Diagnosis not present

## 2023-09-11 DIAGNOSIS — Z9181 History of falling: Secondary | ICD-10-CM | POA: Diagnosis not present

## 2023-09-11 DIAGNOSIS — R2681 Unsteadiness on feet: Secondary | ICD-10-CM | POA: Diagnosis not present

## 2023-09-11 DIAGNOSIS — D464 Refractory anemia, unspecified: Secondary | ICD-10-CM | POA: Diagnosis not present

## 2023-09-11 DIAGNOSIS — S72141D Displaced intertrochanteric fracture of right femur, subsequent encounter for closed fracture with routine healing: Secondary | ICD-10-CM | POA: Diagnosis not present

## 2023-09-11 DIAGNOSIS — I10 Essential (primary) hypertension: Secondary | ICD-10-CM | POA: Diagnosis not present

## 2023-09-12 DIAGNOSIS — M6281 Muscle weakness (generalized): Secondary | ICD-10-CM | POA: Diagnosis not present

## 2023-09-12 DIAGNOSIS — R2681 Unsteadiness on feet: Secondary | ICD-10-CM | POA: Diagnosis not present

## 2023-09-12 DIAGNOSIS — R2689 Other abnormalities of gait and mobility: Secondary | ICD-10-CM | POA: Diagnosis not present

## 2023-09-12 DIAGNOSIS — S72141D Displaced intertrochanteric fracture of right femur, subsequent encounter for closed fracture with routine healing: Secondary | ICD-10-CM | POA: Diagnosis not present

## 2023-09-13 DIAGNOSIS — M6281 Muscle weakness (generalized): Secondary | ICD-10-CM | POA: Diagnosis not present

## 2023-09-13 DIAGNOSIS — S72141D Displaced intertrochanteric fracture of right femur, subsequent encounter for closed fracture with routine healing: Secondary | ICD-10-CM | POA: Diagnosis not present

## 2023-09-13 DIAGNOSIS — R2689 Other abnormalities of gait and mobility: Secondary | ICD-10-CM | POA: Diagnosis not present

## 2023-09-13 DIAGNOSIS — R2681 Unsteadiness on feet: Secondary | ICD-10-CM | POA: Diagnosis not present

## 2023-09-14 DIAGNOSIS — Z9181 History of falling: Secondary | ICD-10-CM | POA: Diagnosis not present

## 2023-09-14 DIAGNOSIS — R2689 Other abnormalities of gait and mobility: Secondary | ICD-10-CM | POA: Diagnosis not present

## 2023-09-14 DIAGNOSIS — S72141D Displaced intertrochanteric fracture of right femur, subsequent encounter for closed fracture with routine healing: Secondary | ICD-10-CM | POA: Diagnosis not present

## 2023-09-14 DIAGNOSIS — R2681 Unsteadiness on feet: Secondary | ICD-10-CM | POA: Diagnosis not present

## 2023-09-14 DIAGNOSIS — S72091D Other fracture of head and neck of right femur, subsequent encounter for closed fracture with routine healing: Secondary | ICD-10-CM | POA: Diagnosis not present

## 2023-09-14 DIAGNOSIS — J449 Chronic obstructive pulmonary disease, unspecified: Secondary | ICD-10-CM | POA: Diagnosis not present

## 2023-09-14 DIAGNOSIS — R278 Other lack of coordination: Secondary | ICD-10-CM | POA: Diagnosis not present

## 2023-09-14 DIAGNOSIS — M6281 Muscle weakness (generalized): Secondary | ICD-10-CM | POA: Diagnosis not present

## 2023-09-14 DIAGNOSIS — Z4781 Encounter for orthopedic aftercare following surgical amputation: Secondary | ICD-10-CM | POA: Diagnosis not present

## 2023-09-15 DIAGNOSIS — S72141D Displaced intertrochanteric fracture of right femur, subsequent encounter for closed fracture with routine healing: Secondary | ICD-10-CM | POA: Diagnosis not present

## 2023-09-15 DIAGNOSIS — R2681 Unsteadiness on feet: Secondary | ICD-10-CM | POA: Diagnosis not present

## 2023-09-15 DIAGNOSIS — M6281 Muscle weakness (generalized): Secondary | ICD-10-CM | POA: Diagnosis not present

## 2023-09-15 DIAGNOSIS — R2689 Other abnormalities of gait and mobility: Secondary | ICD-10-CM | POA: Diagnosis not present

## 2023-09-17 DIAGNOSIS — M6281 Muscle weakness (generalized): Secondary | ICD-10-CM | POA: Diagnosis not present

## 2023-09-17 DIAGNOSIS — R2681 Unsteadiness on feet: Secondary | ICD-10-CM | POA: Diagnosis not present

## 2023-09-17 DIAGNOSIS — S72141D Displaced intertrochanteric fracture of right femur, subsequent encounter for closed fracture with routine healing: Secondary | ICD-10-CM | POA: Diagnosis not present

## 2023-09-17 DIAGNOSIS — R2689 Other abnormalities of gait and mobility: Secondary | ICD-10-CM | POA: Diagnosis not present

## 2023-09-18 DIAGNOSIS — R2689 Other abnormalities of gait and mobility: Secondary | ICD-10-CM | POA: Diagnosis not present

## 2023-09-18 DIAGNOSIS — R2681 Unsteadiness on feet: Secondary | ICD-10-CM | POA: Diagnosis not present

## 2023-09-18 DIAGNOSIS — R278 Other lack of coordination: Secondary | ICD-10-CM | POA: Diagnosis not present

## 2023-09-18 DIAGNOSIS — S72141D Displaced intertrochanteric fracture of right femur, subsequent encounter for closed fracture with routine healing: Secondary | ICD-10-CM | POA: Diagnosis not present

## 2023-09-18 DIAGNOSIS — Z4781 Encounter for orthopedic aftercare following surgical amputation: Secondary | ICD-10-CM | POA: Diagnosis not present

## 2023-09-18 DIAGNOSIS — J449 Chronic obstructive pulmonary disease, unspecified: Secondary | ICD-10-CM | POA: Diagnosis not present

## 2023-09-18 DIAGNOSIS — S72091D Other fracture of head and neck of right femur, subsequent encounter for closed fracture with routine healing: Secondary | ICD-10-CM | POA: Diagnosis not present

## 2023-09-18 DIAGNOSIS — Z9181 History of falling: Secondary | ICD-10-CM | POA: Diagnosis not present

## 2023-09-18 DIAGNOSIS — M6281 Muscle weakness (generalized): Secondary | ICD-10-CM | POA: Diagnosis not present

## 2023-09-19 DIAGNOSIS — R2689 Other abnormalities of gait and mobility: Secondary | ICD-10-CM | POA: Diagnosis not present

## 2023-09-19 DIAGNOSIS — R2681 Unsteadiness on feet: Secondary | ICD-10-CM | POA: Diagnosis not present

## 2023-09-19 DIAGNOSIS — M6281 Muscle weakness (generalized): Secondary | ICD-10-CM | POA: Diagnosis not present

## 2023-09-19 DIAGNOSIS — S72141D Displaced intertrochanteric fracture of right femur, subsequent encounter for closed fracture with routine healing: Secondary | ICD-10-CM | POA: Diagnosis not present

## 2023-09-20 DIAGNOSIS — R2689 Other abnormalities of gait and mobility: Secondary | ICD-10-CM | POA: Diagnosis not present

## 2023-09-20 DIAGNOSIS — M6281 Muscle weakness (generalized): Secondary | ICD-10-CM | POA: Diagnosis not present

## 2023-09-20 DIAGNOSIS — R2681 Unsteadiness on feet: Secondary | ICD-10-CM | POA: Diagnosis not present

## 2023-09-20 DIAGNOSIS — S72141D Displaced intertrochanteric fracture of right femur, subsequent encounter for closed fracture with routine healing: Secondary | ICD-10-CM | POA: Diagnosis not present

## 2023-09-21 DIAGNOSIS — R2681 Unsteadiness on feet: Secondary | ICD-10-CM | POA: Diagnosis not present

## 2023-09-21 DIAGNOSIS — M6281 Muscle weakness (generalized): Secondary | ICD-10-CM | POA: Diagnosis not present

## 2023-09-21 DIAGNOSIS — J449 Chronic obstructive pulmonary disease, unspecified: Secondary | ICD-10-CM | POA: Diagnosis not present

## 2023-09-21 DIAGNOSIS — S72091D Other fracture of head and neck of right femur, subsequent encounter for closed fracture with routine healing: Secondary | ICD-10-CM | POA: Diagnosis not present

## 2023-09-21 DIAGNOSIS — S72141D Displaced intertrochanteric fracture of right femur, subsequent encounter for closed fracture with routine healing: Secondary | ICD-10-CM | POA: Diagnosis not present

## 2023-09-21 DIAGNOSIS — Z4781 Encounter for orthopedic aftercare following surgical amputation: Secondary | ICD-10-CM | POA: Diagnosis not present

## 2023-09-21 DIAGNOSIS — R278 Other lack of coordination: Secondary | ICD-10-CM | POA: Diagnosis not present

## 2023-09-21 DIAGNOSIS — R2689 Other abnormalities of gait and mobility: Secondary | ICD-10-CM | POA: Diagnosis not present

## 2023-09-21 DIAGNOSIS — Z9181 History of falling: Secondary | ICD-10-CM | POA: Diagnosis not present

## 2023-09-22 DIAGNOSIS — S72141D Displaced intertrochanteric fracture of right femur, subsequent encounter for closed fracture with routine healing: Secondary | ICD-10-CM | POA: Diagnosis not present

## 2023-09-22 DIAGNOSIS — J449 Chronic obstructive pulmonary disease, unspecified: Secondary | ICD-10-CM | POA: Diagnosis not present

## 2023-09-22 DIAGNOSIS — R2681 Unsteadiness on feet: Secondary | ICD-10-CM | POA: Diagnosis not present

## 2023-09-22 DIAGNOSIS — M6281 Muscle weakness (generalized): Secondary | ICD-10-CM | POA: Diagnosis not present

## 2023-09-22 DIAGNOSIS — I10 Essential (primary) hypertension: Secondary | ICD-10-CM | POA: Diagnosis not present

## 2023-09-22 DIAGNOSIS — R2689 Other abnormalities of gait and mobility: Secondary | ICD-10-CM | POA: Diagnosis not present

## 2023-09-22 DIAGNOSIS — E46 Unspecified protein-calorie malnutrition: Secondary | ICD-10-CM | POA: Diagnosis not present

## 2023-09-23 DIAGNOSIS — R2681 Unsteadiness on feet: Secondary | ICD-10-CM | POA: Diagnosis not present

## 2023-09-23 DIAGNOSIS — S72141D Displaced intertrochanteric fracture of right femur, subsequent encounter for closed fracture with routine healing: Secondary | ICD-10-CM | POA: Diagnosis not present

## 2023-09-23 DIAGNOSIS — M6281 Muscle weakness (generalized): Secondary | ICD-10-CM | POA: Diagnosis not present

## 2023-09-23 DIAGNOSIS — R2689 Other abnormalities of gait and mobility: Secondary | ICD-10-CM | POA: Diagnosis not present

## 2023-09-25 DIAGNOSIS — R278 Other lack of coordination: Secondary | ICD-10-CM | POA: Diagnosis not present

## 2023-09-25 DIAGNOSIS — S72141D Displaced intertrochanteric fracture of right femur, subsequent encounter for closed fracture with routine healing: Secondary | ICD-10-CM | POA: Diagnosis not present

## 2023-09-25 DIAGNOSIS — S72091D Other fracture of head and neck of right femur, subsequent encounter for closed fracture with routine healing: Secondary | ICD-10-CM | POA: Diagnosis not present

## 2023-09-25 DIAGNOSIS — Z4781 Encounter for orthopedic aftercare following surgical amputation: Secondary | ICD-10-CM | POA: Diagnosis not present

## 2023-09-25 DIAGNOSIS — R2681 Unsteadiness on feet: Secondary | ICD-10-CM | POA: Diagnosis not present

## 2023-09-25 DIAGNOSIS — M6281 Muscle weakness (generalized): Secondary | ICD-10-CM | POA: Diagnosis not present

## 2023-09-25 DIAGNOSIS — Z9181 History of falling: Secondary | ICD-10-CM | POA: Diagnosis not present

## 2023-09-25 DIAGNOSIS — R2689 Other abnormalities of gait and mobility: Secondary | ICD-10-CM | POA: Diagnosis not present

## 2023-09-25 DIAGNOSIS — J449 Chronic obstructive pulmonary disease, unspecified: Secondary | ICD-10-CM | POA: Diagnosis not present

## 2023-09-26 DIAGNOSIS — S72141D Displaced intertrochanteric fracture of right femur, subsequent encounter for closed fracture with routine healing: Secondary | ICD-10-CM | POA: Diagnosis not present

## 2023-09-26 DIAGNOSIS — R2689 Other abnormalities of gait and mobility: Secondary | ICD-10-CM | POA: Diagnosis not present

## 2023-09-26 DIAGNOSIS — M6281 Muscle weakness (generalized): Secondary | ICD-10-CM | POA: Diagnosis not present

## 2023-09-26 DIAGNOSIS — R2681 Unsteadiness on feet: Secondary | ICD-10-CM | POA: Diagnosis not present

## 2023-09-27 DIAGNOSIS — S72141D Displaced intertrochanteric fracture of right femur, subsequent encounter for closed fracture with routine healing: Secondary | ICD-10-CM | POA: Diagnosis not present

## 2023-09-27 DIAGNOSIS — R2681 Unsteadiness on feet: Secondary | ICD-10-CM | POA: Diagnosis not present

## 2023-09-27 DIAGNOSIS — R2689 Other abnormalities of gait and mobility: Secondary | ICD-10-CM | POA: Diagnosis not present

## 2023-09-27 DIAGNOSIS — M6281 Muscle weakness (generalized): Secondary | ICD-10-CM | POA: Diagnosis not present

## 2023-09-28 DIAGNOSIS — S72141D Displaced intertrochanteric fracture of right femur, subsequent encounter for closed fracture with routine healing: Secondary | ICD-10-CM | POA: Diagnosis not present

## 2023-09-28 DIAGNOSIS — R2689 Other abnormalities of gait and mobility: Secondary | ICD-10-CM | POA: Diagnosis not present

## 2023-09-28 DIAGNOSIS — R2681 Unsteadiness on feet: Secondary | ICD-10-CM | POA: Diagnosis not present

## 2023-09-28 DIAGNOSIS — M6281 Muscle weakness (generalized): Secondary | ICD-10-CM | POA: Diagnosis not present

## 2023-10-02 ENCOUNTER — Other Ambulatory Visit: Payer: Self-pay

## 2023-10-02 ENCOUNTER — Emergency Department (HOSPITAL_COMMUNITY): Payer: Medicare Other

## 2023-10-02 ENCOUNTER — Inpatient Hospital Stay (HOSPITAL_COMMUNITY)
Admission: EM | Admit: 2023-10-02 | Discharge: 2023-10-13 | DRG: 304 | Disposition: A | Discharge status: Skilled Nursing Facility | Payer: Medicare Other | Source: Skilled Nursing Facility | Attending: Family Medicine | Admitting: Family Medicine

## 2023-10-02 DIAGNOSIS — Z8673 Personal history of transient ischemic attack (TIA), and cerebral infarction without residual deficits: Secondary | ICD-10-CM

## 2023-10-02 DIAGNOSIS — R64 Cachexia: Secondary | ICD-10-CM | POA: Diagnosis not present

## 2023-10-02 DIAGNOSIS — Z4682 Encounter for fitting and adjustment of non-vascular catheter: Secondary | ICD-10-CM | POA: Diagnosis not present

## 2023-10-02 DIAGNOSIS — M6250 Muscle wasting and atrophy, not elsewhere classified, unspecified site: Secondary | ICD-10-CM | POA: Diagnosis not present

## 2023-10-02 DIAGNOSIS — I10 Essential (primary) hypertension: Secondary | ICD-10-CM | POA: Diagnosis present

## 2023-10-02 DIAGNOSIS — M419 Scoliosis, unspecified: Secondary | ICD-10-CM | POA: Diagnosis not present

## 2023-10-02 DIAGNOSIS — D509 Iron deficiency anemia, unspecified: Secondary | ICD-10-CM | POA: Diagnosis not present

## 2023-10-02 DIAGNOSIS — Y92239 Unspecified place in hospital as the place of occurrence of the external cause: Secondary | ICD-10-CM | POA: Diagnosis not present

## 2023-10-02 DIAGNOSIS — R32 Unspecified urinary incontinence: Secondary | ICD-10-CM | POA: Diagnosis present

## 2023-10-02 DIAGNOSIS — S72091D Other fracture of head and neck of right femur, subsequent encounter for closed fracture with routine healing: Secondary | ICD-10-CM | POA: Diagnosis not present

## 2023-10-02 DIAGNOSIS — Z7401 Bed confinement status: Secondary | ICD-10-CM | POA: Diagnosis not present

## 2023-10-02 DIAGNOSIS — Z7982 Long term (current) use of aspirin: Secondary | ICD-10-CM

## 2023-10-02 DIAGNOSIS — L89152 Pressure ulcer of sacral region, stage 2: Secondary | ICD-10-CM | POA: Diagnosis present

## 2023-10-02 DIAGNOSIS — Z9981 Dependence on supplemental oxygen: Secondary | ICD-10-CM

## 2023-10-02 DIAGNOSIS — R4189 Other symptoms and signs involving cognitive functions and awareness: Principal | ICD-10-CM | POA: Diagnosis present

## 2023-10-02 DIAGNOSIS — Z743 Need for continuous supervision: Secondary | ICD-10-CM | POA: Diagnosis not present

## 2023-10-02 DIAGNOSIS — Z9911 Dependence on respirator [ventilator] status: Secondary | ICD-10-CM | POA: Diagnosis not present

## 2023-10-02 DIAGNOSIS — Z9889 Other specified postprocedural states: Secondary | ICD-10-CM

## 2023-10-02 DIAGNOSIS — Z9181 History of falling: Secondary | ICD-10-CM | POA: Diagnosis not present

## 2023-10-02 DIAGNOSIS — G9341 Metabolic encephalopathy: Secondary | ICD-10-CM | POA: Diagnosis present

## 2023-10-02 DIAGNOSIS — Z79899 Other long term (current) drug therapy: Secondary | ICD-10-CM

## 2023-10-02 DIAGNOSIS — K224 Dyskinesia of esophagus: Secondary | ICD-10-CM | POA: Diagnosis present

## 2023-10-02 DIAGNOSIS — I674 Hypertensive encephalopathy: Secondary | ICD-10-CM | POA: Diagnosis present

## 2023-10-02 DIAGNOSIS — I161 Hypertensive emergency: Principal | ICD-10-CM | POA: Diagnosis present

## 2023-10-02 DIAGNOSIS — M6281 Muscle weakness (generalized): Secondary | ICD-10-CM | POA: Diagnosis not present

## 2023-10-02 DIAGNOSIS — J9621 Acute and chronic respiratory failure with hypoxia: Secondary | ICD-10-CM | POA: Diagnosis not present

## 2023-10-02 DIAGNOSIS — I952 Hypotension due to drugs: Secondary | ICD-10-CM | POA: Diagnosis not present

## 2023-10-02 DIAGNOSIS — R6889 Other general symptoms and signs: Secondary | ICD-10-CM | POA: Diagnosis not present

## 2023-10-02 DIAGNOSIS — E785 Hyperlipidemia, unspecified: Secondary | ICD-10-CM | POA: Diagnosis present

## 2023-10-02 DIAGNOSIS — I6782 Cerebral ischemia: Secondary | ICD-10-CM | POA: Diagnosis not present

## 2023-10-02 DIAGNOSIS — I160 Hypertensive urgency: Secondary | ICD-10-CM | POA: Diagnosis not present

## 2023-10-02 DIAGNOSIS — R569 Unspecified convulsions: Secondary | ICD-10-CM | POA: Diagnosis present

## 2023-10-02 DIAGNOSIS — I2489 Other forms of acute ischemic heart disease: Secondary | ICD-10-CM | POA: Diagnosis present

## 2023-10-02 DIAGNOSIS — R000 Tachycardia, unspecified: Secondary | ICD-10-CM | POA: Diagnosis not present

## 2023-10-02 DIAGNOSIS — N390 Urinary tract infection, site not specified: Secondary | ICD-10-CM | POA: Diagnosis not present

## 2023-10-02 DIAGNOSIS — F32A Depression, unspecified: Secondary | ICD-10-CM | POA: Diagnosis present

## 2023-10-02 DIAGNOSIS — Z7951 Long term (current) use of inhaled steroids: Secondary | ICD-10-CM

## 2023-10-02 DIAGNOSIS — R278 Other lack of coordination: Secondary | ICD-10-CM | POA: Diagnosis not present

## 2023-10-02 DIAGNOSIS — M47814 Spondylosis without myelopathy or radiculopathy, thoracic region: Secondary | ICD-10-CM | POA: Diagnosis not present

## 2023-10-02 DIAGNOSIS — J9622 Acute and chronic respiratory failure with hypercapnia: Secondary | ICD-10-CM | POA: Diagnosis present

## 2023-10-02 DIAGNOSIS — Z4781 Encounter for orthopedic aftercare following surgical amputation: Secondary | ICD-10-CM | POA: Diagnosis not present

## 2023-10-02 DIAGNOSIS — Z6822 Body mass index (BMI) 22.0-22.9, adult: Secondary | ICD-10-CM

## 2023-10-02 DIAGNOSIS — G319 Degenerative disease of nervous system, unspecified: Secondary | ICD-10-CM | POA: Diagnosis not present

## 2023-10-02 DIAGNOSIS — J449 Chronic obstructive pulmonary disease, unspecified: Secondary | ICD-10-CM | POA: Diagnosis present

## 2023-10-02 DIAGNOSIS — E43 Unspecified severe protein-calorie malnutrition: Secondary | ICD-10-CM | POA: Diagnosis present

## 2023-10-02 DIAGNOSIS — F0393 Unspecified dementia, unspecified severity, with mood disturbance: Secondary | ICD-10-CM | POA: Diagnosis present

## 2023-10-02 DIAGNOSIS — D259 Leiomyoma of uterus, unspecified: Secondary | ICD-10-CM | POA: Diagnosis present

## 2023-10-02 DIAGNOSIS — I499 Cardiac arrhythmia, unspecified: Secondary | ICD-10-CM | POA: Diagnosis not present

## 2023-10-02 DIAGNOSIS — Z87891 Personal history of nicotine dependence: Secondary | ICD-10-CM

## 2023-10-02 DIAGNOSIS — M47816 Spondylosis without myelopathy or radiculopathy, lumbar region: Secondary | ICD-10-CM | POA: Diagnosis not present

## 2023-10-02 DIAGNOSIS — Z90710 Acquired absence of both cervix and uterus: Secondary | ICD-10-CM

## 2023-10-02 DIAGNOSIS — I700 Atherosclerosis of aorta: Secondary | ICD-10-CM | POA: Diagnosis not present

## 2023-10-02 DIAGNOSIS — R0689 Other abnormalities of breathing: Secondary | ICD-10-CM | POA: Diagnosis not present

## 2023-10-02 DIAGNOSIS — Z8781 Personal history of (healed) traumatic fracture: Secondary | ICD-10-CM

## 2023-10-02 DIAGNOSIS — R1310 Dysphagia, unspecified: Secondary | ICD-10-CM | POA: Diagnosis not present

## 2023-10-02 DIAGNOSIS — Z043 Encounter for examination and observation following other accident: Secondary | ICD-10-CM | POA: Diagnosis not present

## 2023-10-02 DIAGNOSIS — I6523 Occlusion and stenosis of bilateral carotid arteries: Secondary | ICD-10-CM | POA: Diagnosis not present

## 2023-10-02 DIAGNOSIS — E876 Hypokalemia: Secondary | ICD-10-CM | POA: Diagnosis not present

## 2023-10-02 DIAGNOSIS — M549 Dorsalgia, unspecified: Secondary | ICD-10-CM | POA: Diagnosis not present

## 2023-10-02 DIAGNOSIS — R0603 Acute respiratory distress: Secondary | ICD-10-CM | POA: Diagnosis not present

## 2023-10-02 DIAGNOSIS — M48061 Spinal stenosis, lumbar region without neurogenic claudication: Secondary | ICD-10-CM | POA: Diagnosis not present

## 2023-10-02 DIAGNOSIS — R739 Hyperglycemia, unspecified: Secondary | ICD-10-CM | POA: Diagnosis not present

## 2023-10-02 DIAGNOSIS — R410 Disorientation, unspecified: Secondary | ICD-10-CM | POA: Diagnosis not present

## 2023-10-02 DIAGNOSIS — J9690 Respiratory failure, unspecified, unspecified whether with hypoxia or hypercapnia: Secondary | ICD-10-CM | POA: Diagnosis not present

## 2023-10-02 DIAGNOSIS — T40415A Adverse effect of fentanyl or fentanyl analogs, initial encounter: Secondary | ICD-10-CM | POA: Diagnosis not present

## 2023-10-02 DIAGNOSIS — B9620 Unspecified Escherichia coli [E. coli] as the cause of diseases classified elsewhere: Secondary | ICD-10-CM | POA: Diagnosis not present

## 2023-10-02 LAB — CBC
HCT: 36.4 % (ref 36.0–46.0)
Hemoglobin: 10.9 g/dL — ABNORMAL LOW (ref 12.0–15.0)
MCH: 28.9 pg (ref 26.0–34.0)
MCHC: 29.9 g/dL — ABNORMAL LOW (ref 30.0–36.0)
MCV: 96.6 fL (ref 80.0–100.0)
Platelets: 294 10*3/uL (ref 150–400)
RBC: 3.77 MIL/uL — ABNORMAL LOW (ref 3.87–5.11)
RDW: 13.6 % (ref 11.5–15.5)
WBC: 13.5 10*3/uL — ABNORMAL HIGH (ref 4.0–10.5)
nRBC: 0 % (ref 0.0–0.2)

## 2023-10-02 LAB — RAPID URINE DRUG SCREEN, HOSP PERFORMED
Amphetamines: NOT DETECTED
Barbiturates: NOT DETECTED
Benzodiazepines: NOT DETECTED
Cocaine: NOT DETECTED
Opiates: NOT DETECTED
Tetrahydrocannabinol: NOT DETECTED

## 2023-10-02 LAB — I-STAT VENOUS BLOOD GAS, ED
Acid-Base Excess: 5 mmol/L — ABNORMAL HIGH (ref 0.0–2.0)
Bicarbonate: 37 mmol/L — ABNORMAL HIGH (ref 20.0–28.0)
Calcium, Ion: 1.32 mmol/L (ref 1.15–1.40)
HCT: 36 % (ref 36.0–46.0)
Hemoglobin: 12.2 g/dL (ref 12.0–15.0)
O2 Saturation: 99 %
Potassium: 4.1 mmol/L (ref 3.5–5.1)
Sodium: 140 mmol/L (ref 135–145)
TCO2: 40 mmol/L — ABNORMAL HIGH (ref 22–32)
pCO2, Ven: 101 mm[Hg] (ref 44–60)
pH, Ven: 7.172 — CL (ref 7.25–7.43)
pO2, Ven: 201 mm[Hg] — ABNORMAL HIGH (ref 32–45)

## 2023-10-02 LAB — I-STAT ARTERIAL BLOOD GAS, ED
Acid-Base Excess: 9 mmol/L — ABNORMAL HIGH (ref 0.0–2.0)
Bicarbonate: 33 mmol/L — ABNORMAL HIGH (ref 20.0–28.0)
Calcium, Ion: 1.25 mmol/L (ref 1.15–1.40)
HCT: 33 % — ABNORMAL LOW (ref 36.0–46.0)
Hemoglobin: 11.2 g/dL — ABNORMAL LOW (ref 12.0–15.0)
O2 Saturation: 100 %
Patient temperature: 37.3
Potassium: 3.5 mmol/L (ref 3.5–5.1)
Sodium: 139 mmol/L (ref 135–145)
TCO2: 34 mmol/L — ABNORMAL HIGH (ref 22–32)
pCO2 arterial: 42.9 mm[Hg] (ref 32–48)
pH, Arterial: 7.496 — ABNORMAL HIGH (ref 7.35–7.45)
pO2, Arterial: 287 mm[Hg] — ABNORMAL HIGH (ref 83–108)

## 2023-10-02 LAB — BASIC METABOLIC PANEL
Anion gap: 8 (ref 5–15)
BUN: 12 mg/dL (ref 8–23)
CO2: 34 mmol/L — ABNORMAL HIGH (ref 22–32)
Calcium: 9.6 mg/dL (ref 8.9–10.3)
Chloride: 100 mmol/L (ref 98–111)
Creatinine, Ser: 0.41 mg/dL — ABNORMAL LOW (ref 0.44–1.00)
GFR, Estimated: >60 mL/min (ref >60)
Glucose, Bld: 209 mg/dL — ABNORMAL HIGH (ref 70–99)
Potassium: 4.1 mmol/L (ref 3.5–5.1)
Sodium: 142 mmol/L (ref 135–145)

## 2023-10-02 LAB — GLUCOSE, CAPILLARY: Glucose-Capillary: 163 mg/dL — ABNORMAL HIGH (ref 70–99)

## 2023-10-02 LAB — TROPONIN I (HIGH SENSITIVITY)
Troponin I (High Sensitivity): 144 ng/L (ref <18)
Troponin I (High Sensitivity): 334 ng/L (ref <18)

## 2023-10-02 LAB — CBG MONITORING, ED: Glucose-Capillary: 197 mg/dL — ABNORMAL HIGH (ref 70–99)

## 2023-10-02 MED ORDER — MIDAZOLAM HCL 2 MG/2ML IJ SOLN: 1.0000 mg | INTRAMUSCULAR | Status: DC | PRN

## 2023-10-02 MED ORDER — CITALOPRAM HYDROBROMIDE 10 MG PO TABS
10.0000 mg | ORAL_TABLET | Freq: Every day | ORAL | Status: DC
Start: 2023-10-03 — End: 2023-10-04

## 2023-10-02 MED ORDER — FAMOTIDINE 20 MG PO TABS: 20.0000 mg | ORAL_TABLET | Freq: Two times a day (BID) | ORAL | Status: DC

## 2023-10-02 MED ORDER — ALBUTEROL SULFATE (2.5 MG/3ML) 0.083% IN NEBU: 2.5000 mg | INHALATION_SOLUTION | RESPIRATORY_TRACT | Status: DC | PRN

## 2023-10-02 MED ORDER — BUDESONIDE 0.5 MG/2ML IN SUSP
0.5000 mg | Freq: Two times a day (BID) | RESPIRATORY_TRACT | Status: DC
  Administered 2023-10-02 – 2023-10-13 (×22): 0.5 mg via RESPIRATORY_TRACT
  Filled 2023-10-02 (×22): qty 2

## 2023-10-02 MED ORDER — SODIUM CHLORIDE 0.9 % IV SOLN: INTRAVENOUS | Status: DC

## 2023-10-02 MED ORDER — ASPIRIN 81 MG PO CHEW
81.0000 mg | CHEWABLE_TABLET | Freq: Every day | ORAL | Status: DC
  Administered 2023-10-04: 81 mg
  Filled 2023-10-02: qty 1

## 2023-10-02 MED ORDER — LABETALOL HCL 5 MG/ML IV SOLN
10.0000 mg | Freq: Once | INTRAVENOUS | Status: AC
  Administered 2023-10-02: 10 mg via INTRAVENOUS
  Filled 2023-10-02: qty 4

## 2023-10-02 MED ORDER — CLEVIDIPINE BUTYRATE 0.5 MG/ML IV EMUL
0.0000 mg/h | INTRAVENOUS | Status: DC
  Administered 2023-10-02: 1 mg/h via INTRAVENOUS

## 2023-10-02 MED ORDER — HEPARIN SODIUM (PORCINE) 5000 UNIT/ML IJ SOLN
5000.0000 [IU] | Freq: Three times a day (TID) | INTRAMUSCULAR | Status: DC
  Administered 2023-10-02 – 2023-10-13 (×33): 5000 [IU] via SUBCUTANEOUS
  Filled 2023-10-02 (×33): qty 1

## 2023-10-02 MED ORDER — POLYETHYLENE GLYCOL 3350 17 G PO PACK
17.0000 g | PACK | Freq: Every day | ORAL | Status: DC | PRN
  Filled 2023-10-02 (×2): qty 1

## 2023-10-02 MED ORDER — LEVETIRACETAM IN NACL 500 MG/100ML IV SOLN
500.0000 mg | Freq: Two times a day (BID) | INTRAVENOUS | Status: DC
  Administered 2023-10-02 – 2023-10-06 (×8): 500 mg via INTRAVENOUS
  Filled 2023-10-02 (×8): qty 100

## 2023-10-02 MED ORDER — ORAL CARE MOUTH RINSE
15.0000 mL | OROMUCOSAL | Status: DC
  Administered 2023-10-03 (×8): 15 mL via OROMUCOSAL

## 2023-10-02 MED ORDER — IOHEXOL 350 MG/ML SOLN
75.0000 mL | Freq: Once | INTRAVENOUS | Status: AC | PRN
  Administered 2023-10-02: 75 mL via INTRAVENOUS

## 2023-10-02 MED ORDER — CLEVIDIPINE BUTYRATE 0.5 MG/ML IV EMUL: 0.0000 mg/h | INTRAVENOUS | Status: DC

## 2023-10-02 MED ORDER — DOCUSATE SODIUM 100 MG PO CAPS: 100.0000 mg | ORAL_CAPSULE | Freq: Two times a day (BID) | ORAL | Status: DC | PRN

## 2023-10-02 MED ORDER — ATORVASTATIN CALCIUM 40 MG PO TABS
40.0000 mg | ORAL_TABLET | Freq: Every day | ORAL | Status: DC
Start: 2023-10-02 — End: 2023-10-04

## 2023-10-02 MED ORDER — PROPOFOL 1000 MG/100ML IV EMUL
5.0000 ug/kg/min | INTRAVENOUS | Status: DC
  Administered 2023-10-02: 10 ug/kg/min via INTRAVENOUS

## 2023-10-02 MED ORDER — CHLORHEXIDINE GLUCONATE CLOTH 2 % EX PADS
6.0000 | MEDICATED_PAD | Freq: Every day | CUTANEOUS | Status: DC
  Administered 2023-10-03 – 2023-10-05 (×3): 6 via TOPICAL

## 2023-10-02 MED ORDER — POLYETHYLENE GLYCOL 3350 17 G PO PACK: 17.0000 g | PACK | Freq: Every day | ORAL | Status: DC

## 2023-10-02 MED ORDER — INSULIN ASPART 100 UNIT/ML IJ SOLN
0.0000 [IU] | INTRAMUSCULAR | Status: DC
Start: 2023-10-03 — End: 2023-10-05
  Administered 2023-10-02: 2 [IU] via SUBCUTANEOUS
  Administered 2023-10-04 – 2023-10-05 (×2): 1 [IU] via SUBCUTANEOUS

## 2023-10-02 MED ORDER — FENTANYL CITRATE PF 50 MCG/ML IJ SOSY
25.0000 ug | PREFILLED_SYRINGE | INTRAMUSCULAR | Status: DC | PRN
Start: 2023-10-02 — End: 2023-10-03
  Administered 2023-10-02 (×2): 25 ug via INTRAVENOUS
  Filled 2023-10-02 (×2): qty 1

## 2023-10-02 MED ORDER — REVEFENACIN 175 MCG/3ML IN SOLN
175.0000 ug | Freq: Every day | RESPIRATORY_TRACT | Status: DC
  Administered 2023-10-03 – 2023-10-13 (×11): 175 ug via RESPIRATORY_TRACT
  Filled 2023-10-02 (×11): qty 3

## 2023-10-02 MED ORDER — ASPIRIN 300 MG RE SUPP
300.0000 mg | Freq: Every day | RECTAL | Status: DC
  Administered 2023-10-03: 300 mg via RECTAL
  Filled 2023-10-02 (×2): qty 1

## 2023-10-02 MED ORDER — DOCUSATE SODIUM 50 MG/5ML PO LIQD: 100.0000 mg | Freq: Two times a day (BID) | ORAL | Status: DC

## 2023-10-02 MED ORDER — ORAL CARE MOUTH RINSE: 15.0000 mL | OROMUCOSAL | Status: DC | PRN

## 2023-10-02 MED ORDER — ARFORMOTEROL TARTRATE 15 MCG/2ML IN NEBU
15.0000 ug | INHALATION_SOLUTION | Freq: Two times a day (BID) | RESPIRATORY_TRACT | Status: DC
  Administered 2023-10-02 – 2023-10-13 (×22): 15 ug via RESPIRATORY_TRACT
  Filled 2023-10-02 (×22): qty 2

## 2023-10-02 MED ORDER — FENTANYL CITRATE PF 50 MCG/ML IJ SOSY
25.0000 ug | PREFILLED_SYRINGE | INTRAMUSCULAR | Status: DC | PRN
  Administered 2023-10-03 (×2): 25 ug via INTRAVENOUS
  Administered 2023-10-03: 50 ug via INTRAVENOUS
  Filled 2023-10-02: qty 2

## 2023-10-02 NOTE — Progress Notes (Signed)
RT transported pt from [facility] to 4N17 with RN at bedside. No complications at this time.

## 2023-10-02 NOTE — Progress Notes (Addendum)
eLink Physician-Brief Progress Note Patient Name: KATHREN SCEARCE DOB: [birth year] MRN: [id number]   Date of Service  10/02/2023  HPI/Events of Note  88 year old with a history of COPD on chronic 3 to 4 L of oxygen, essential hypertension, and recent hip fracture in August 2024 he was found to be encephalopathic, foaming at the mouth, and felt to be having a seizure.  Intubated.  On examination she has normal vitals.  Saturating 100% on 40% FiO2.  Metabolic panel consistent with mild hyperglycemia.  Troponin mildly elevated at 334.  Mild leukocytosis.  Imaging reviewed-no actionable findings.    eICU Interventions  Consider discontinuation of home tramadol  Maintain mechanical ventilation,, standard vent wean.  MRI brain pending, EEG pending viable  GI prophylaxis with Famotidine DVT  prophylaxis with heparin   0136 -KUB requested for medical screening prior to MRI.  Will order.  Unable to place enteric tube despite multiple attempts, can reassess in the morning. 0200 -had a moment of hypotension in response to fentanyl.  Low-dose normal saline running.  Cleviprex is off.  Slowly improved without intervention-now 122/60.  No intervention is indicated.  Reduced dose of fentanyl  Intervention Category Evaluation Type: New Patient Evaluation  Suzanne Garbers 10/02/2023, 11:55 PM

## 2023-10-02 NOTE — H&P (Cosign Needed Addendum)
NAME:  Chloe Wilcox, MRN:  [id number], DOB:  [birth year], LOS: 0 ADMISSION DATE:  10/02/2023, CONSULTATION DATE:  11/4 REFERRING MD:  Rhae Hammock, CHIEF COMPLAINT:  seizure and respiratory failure    History of Present Illness:  88 year old female w/ extensive medical hx as outlined below, currently resides at [facility] since her hip fracture in Aug 2024. Per Hx was found by staff w/ altered mentation, seizure activity and foaming at mouth.  On EMS arrival was unresponsive, had some facial twitching but before they could medicate for possible seizure she became apneic. Transferred to ER w/ bag mask ventilation. Intubated shortly after arrival by EDP.   ER eval  Recorded MAP 160  w/ SBP >230s and dbp >120s Glucose 197, wbc 13.5 hgb 10.9 VBG 7.17/pco2 101/o2 201/bicarb 40 CXR ETT good position. Hyperinflated. Air in stomach. No infiltrates or edema  CT head: neg for acute process CT angio negative for LVO   PCCM asked to admit   Pertinent  Medical History  Remote smoker, COPD (presumptive...could not complete PFTs), chronic hypoxic resp failure on oxygen (3-4 lpm baseline), depression, HTN, hyperlipidemia, FESO4 def anemia,  Hip fracture August 2024 s/p mechanical fall requiring IM nailing of the right hip   Significant Hospital Events: Including procedures, antibiotic start and stop dates in addition to other pertinent events   11/4 admitted w/ working dx of hypertensive encephalopathy and HCRF initial MAP 160  Interim History / Subjective:  sedated  Objective   Blood pressure (!) 190/95, pulse 84, temperature 99.1 F (37.3 C), temperature source Axillary, resp. rate 12, height 5\' 2"  (1.575 m), weight 57 kg, SpO2 100%.    Vent Mode: PRVC FiO2 (%):  [40 %-100 %] 40 % Set Rate:  [12 bmp-16 bmp] 12 bmp Vt Set:  [400 mL-500 mL] 400 mL PEEP:  [5 cmH20] 5 cmH20 Plateau Pressure:  [17 cmH20] 17 cmH20  No intake or output data in the 24 hours ending 10/02/23 2235 Filed Weights    10/02/23 2100  Weight: 57 kg    Examination: General: frail cachectic 88 year old female currently minimally responsive in vent  HENT: NCAT temporal wasting MMM orally intubated Lungs: clear. Fine crackles bases. No wheezing. CXR w/out infiltrate Cardiovascular: RRR w/out MRG Abdomen: soft + bowel sounds  Extremities: warm dry trace LE edema Neuro: grimaces w/ suctioning. Starting to raise left w/ noxious stim. There is some intermittent tremor noted w/ this. PERRL GU: due to void   Resolved Hospital Problem list     Assessment & Plan:  Acute on Chronic Hypercarbic respiratory failure.  Chronic resp failure 2/2 COPD Etiology of acute event not clear. Had witnessed seizure activity so possibly post ictal state superimposed on chronic disease. Had rapid improvement in PCO2 w/ assisted ventilation and exam neg for wheezing and bronchospasm  Plan Admit to ICU  VE reduced to adjust for hyperventilation  PAD protocol RASS goal -1 VAP bundle  Scheduled BDs F/u abg Am cxr  Hypertensive emergency  -has been here for > 2 hrs. Initial MAP 160s. This has slowly improved w/ propofol  Plan Dc prop Cleviprex goal SBP <160 Tele  Cycle CEs 12 lead ECHO in am   Acute metabolic encephalopathy most c/w hypertensive encephalopathy vs PRES and complicated by possible seizure  Plan BP control as above Serial neuro checks Dc prop. Change to PRN low dose fent and versed Load keppra EEG MRI   Hyperglycemia Plan Ssi   Feso4 def anemia Plan  Monitor    Mild leukocytosis  Plan Am cbc Trend fever curve Am cxr  Best Practice (right click and "Reselect all SmartList Selections" daily)   Diet/type: NPO DVT prophylaxis: prophylactic heparin  GI prophylaxis: H2B Lines: N/A Foley:  Yes, and it is still needed Code Status:  full code Last date of multidisciplinary goals of care discussion [pending ] Full code for now as discussed w/ family at bedside. The patient's daughter is not  currently available. I have discussed w/ current available family that we need to discuss this further   Labs   CBC: Recent Labs  Lab 10/02/23 2050 10/02/23 2101 10/02/23 2211  WBC 13.5*  --   --   HGB 10.9* 12.2 11.2*  HCT 36.4 36.0 33.0*  MCV 96.6  --   --   PLT 294  --   --     Basic Metabolic Panel: Recent Labs  Lab 10/02/23 2050 10/02/23 2101 10/02/23 2211  NA 142 140 139  K 4.1 4.1 3.5  CL 100  --   --   CO2 34*  --   --   GLUCOSE 209*  --   --   BUN 12  --   --   CREATININE 0.41*  --   --   CALCIUM 9.6  --   --    GFR: Estimated Creatinine Clearance: 39.9 mL/min (A) (by C-G formula based on SCr of 0.41 mg/dL (L)). Recent Labs  Lab 10/02/23 2050  WBC 13.5*    Liver Function Tests: No results for input(s): "AST", "ALT", "ALKPHOS", "BILITOT", "PROT", "ALBUMIN" in the last 168 hours. No results for input(s): "LIPASE", "AMYLASE" in the last 168 hours. No results for input(s): "AMMONIA" in the last 168 hours.  ABG    Component Value Date/Time   PHART 7.496 (H) 10/02/2023 2211   PCO2ART 42.9 10/02/2023 2211   PO2ART 287 (H) 10/02/2023 2211   HCO3 33.0 (H) 10/02/2023 2211   TCO2 34 (H) 10/02/2023 2211   O2SAT 100 10/02/2023 2211     Coagulation Profile: No results for input(s): "INR", "PROTIME" in the last 168 hours.  Cardiac Enzymes: No results for input(s): "CKTOTAL", "CKMB", "CKMBINDEX", "TROPONINI" in the last 168 hours.  HbA1C: No results found for: "HGBA1C"  CBG: Recent Labs  Lab 10/02/23 2109  GLUCAP 197*    Review of Systems:   Not able   Past Medical History:  She,  has a past medical history of COPD (chronic obstructive pulmonary disease) (HCC), Depression, and Hypertension.   Surgical History:   Past Surgical History:  Procedure Laterality Date   INTRAMEDULLARY (IM) NAIL INTERTROCHANTERIC Right 07/10/2023   Procedure: INTRAMEDULLARY (IM) NAIL INTERTROCHANTERIC;  Surgeon: Jones Broom, MD;  Location: [facility];  Service:  Orthopedics;  Laterality: Right;   VAGINAL HYSTERECTOMY       Social History:   reports that she quit smoking about 39 years ago. Her smoking use included cigarettes. She started smoking about 72 years ago. She has a 33 pack-year smoking history. She has never used smokeless tobacco. She reports that she does not drink alcohol and does not use drugs.   Family History:  Her Family history is unknown by patient.   Allergies No Known Allergies   Home Medications  Prior to Admission medications   Medication Sig Start Date End Date Taking? Authorizing Provider  acetaminophen (TYLENOL) 325 MG tablet Take 650 mg by mouth in the morning, at noon, and at bedtime.   Yes [provider]  atorvastatin (  LIPITOR) 40 MG tablet Take 40 mg by mouth at bedtime.   Yes [provider]  Budeson-Glycopyrrol-Formoterol (BREZTRI AEROSPHERE) 160-9-4.8 MCG/ACT AERO Inhale 2 puffs into the lungs in the morning and at bedtime. 05/30/23  Yes Parrett, Tammy S, NP  Cholecalciferol 1.25 MG (50000 UT) capsule Take 50,000 Units by mouth once a week. Takes on Tuesdays   Yes [provider]  citalopram (CELEXA) 10 MG tablet Take 1 tablet (10 mg total) by mouth daily. 07/14/23  Yes Rhetta Mura, MD  docusate sodium (COLACE) 100 MG capsule Take 1 capsule (100 mg total) by mouth 2 (two) times daily. 07/13/23  Yes Rhetta Mura, MD  ipratropium-albuterol (DUONEB) 0.5-2.5 (3) MG/3ML SOLN Inhale 3 mLs into the lungs in the morning and at bedtime. 06/26/23  Yes [provider]  mirtazapine (REMERON) 7.5 MG tablet Take 7.5 mg by mouth at bedtime. 09/22/23  Yes [provider]  olmesartan (BENICAR) 20 MG tablet Take 10 mg by mouth at bedtime.   Yes [provider]  traMADol (ULTRAM) 50 MG tablet Take 1 tablet (50 mg total) by mouth every 6 (six) hours as needed for moderate pain. 07/12/23  Yes Porterfield, Hospital doctor, PA-C  albuterol (PROVENTIL) (2.5 MG/3ML) 0.083% nebulizer  solution Take 3 mLs (2.5 mg total) by nebulization every 6 (six) hours as needed for wheezing or shortness of breath. Patient not taking: Reported on 10/02/2023 05/01/23   Parrett, Virgel Bouquet, NP  albuterol (VENTOLIN HFA) 108 (90 Base) MCG/ACT inhaler Inhale 2 puffs into the lungs every 6 (six) hours as needed for wheezing or shortness of breath. Patient not taking: Reported on 10/02/2023 05/30/23   Parrett, Virgel Bouquet, NP  aspirin EC 81 MG tablet Take 81 mg by mouth daily. Swallow whole. Patient not taking: Reported on 10/02/2023    [provider]     Critical care time: 45 minutes

## 2023-10-02 NOTE — Progress Notes (Incomplete)
eLink Physician-Brief Progress Note Patient Name: SHERRICA NIEHAUS DOB: September 07, 1935 MRN: [id number]   Date of Service  10/02/2023  HPI/Events of Note  88 year old with a history of COPD on chronic 3 to 4 L of oxygen, essential hypertension, and recent hip fracture in August 2024 he was found to be encephalopathic, foaming at the mouth, and felt to be having a seizure.  Intubated.  On examination she has normal vitals.  Saturating 100% on 40% FiO2.  Metabolic panel consistent with mild hyperglycemia.  Troponin mildly elevated at 334.  Mild leukocytosis.  Imaging reviewed-no actionable findings.    eICU Interventions  Consider discontinuation of home tramadol  Maintain mechanical ventilation,, standard vent wean.  MRI brain pending, EEG pending viable       Intervention Category Evaluation Type: New Patient Evaluation  Saraiah Bhat 10/02/2023, 11:55 PM

## 2023-10-02 NOTE — ED Notes (Signed)
2052- 15mg  Etomidate 2053- 70 of Rocuronium 2054- Intubated by Dr. Rhae Hammock 24 cm at the lip. 2056- Attempted OG x3 with no success. 2058- Propofol started.

## 2023-10-02 NOTE — ED Notes (Signed)
Called RN about pt HR and BP, he informed me that he was present in the room and that the pt was just intubated.

## 2023-10-02 NOTE — Progress Notes (Signed)
RT transported pt from TRA-C to CT-2 and back with RN at bedside. No complications at this time.

## 2023-10-02 NOTE — ED Notes (Signed)
Code medical ct 2

## 2023-10-02 NOTE — ED Provider Notes (Signed)
[place] EMERGENCY DEPARTMENT AT [facility] Provider Note   CSN: [id number] Arrival date & time: 10/02/23  2048     History {Add pertinent medical, surgical, social history, OB history to HPI:1} Chief Complaint  Patient presents with   Seizures    Chloe Wilcox is a 88 y.o. female.  88 year old female with past medical history of hypertension and dementia presenting to the emergency department today unresponsive.  The patient apparently was in her normal state of health earlier this evening and at 8 PM when they went to check on her at her skilled nursing facility she was unresponsive and "foaming at the mouth".  Medics did note some facial twitching.  They are going to give her medications for possible seizure but the patient became apneic.  The patient was being bagged when she was brought into the emergency department.  There is no further information on the patient on time of arrival.   Seizures      Home Medications Prior to Admission medications   Medication Sig Start Date End Date Taking? Authorizing Provider  acetaminophen (TYLENOL) 650 MG CR tablet Take 650 mg by mouth every 8 (eight) hours as needed for pain (or headaches).     [provider]  albuterol (PROVENTIL) (2.5 MG/3ML) 0.083% nebulizer solution Take 3 mLs (2.5 mg total) by nebulization every 6 (six) hours as needed for wheezing or shortness of breath. 05/01/23   Parrett, Virgel Bouquet, NP  albuterol (VENTOLIN HFA) 108 (90 Base) MCG/ACT inhaler Inhale 2 puffs into the lungs every 6 (six) hours as needed for wheezing or shortness of breath. 05/30/23   Parrett, Virgel Bouquet, NP  aspirin EC 81 MG tablet Take 81 mg by mouth daily. Swallow whole.    [provider]  atorvastatin (LIPITOR) 40 MG tablet Take 40 mg by mouth at bedtime.    [provider]  Budeson-Glycopyrrol-Formoterol (BREZTRI AEROSPHERE) 160-9-4.8 MCG/ACT AERO Inhale 2 puffs into the lungs in the morning and at bedtime. 05/30/23    Parrett, Virgel Bouquet, NP  citalopram (CELEXA) 10 MG tablet Take 1 tablet (10 mg total) by mouth daily. 07/14/23   Rhetta Mura, MD  docusate sodium (COLACE) 100 MG capsule Take 1 capsule (100 mg total) by mouth 2 (two) times daily. 07/13/23   Rhetta Mura, MD  ipratropium-albuterol (DUONEB) 0.5-2.5 (3) MG/3ML SOLN Inhale 3 mLs into the lungs in the morning and at bedtime. 06/26/23   [provider]  olmesartan (BENICAR) 20 MG tablet Take 10 mg by mouth at bedtime.    [provider]  traMADol (ULTRAM) 50 MG tablet Take 1 tablet (50 mg total) by mouth every 6 (six) hours as needed for moderate pain. 07/12/23   Porterfield, Amber, PA-C      Allergies    Patient has no known allergies.    Review of Systems   Review of Systems  Reason unable to perform ROS: Obtunded.  Neurological:  Positive for seizures.    Physical Exam Updated Vital Signs SpO2 100%  Physical Exam Vitals and nursing note reviewed.   Gen: Obtunded, GCS 3 Eyes: Pupils 5 mm and sluggish HEENT: no oropharyngeal swelling Neck: trachea midline Resp: Diminished at bilateral lung bases Card: RRR, no murmurs, rubs, or gallops Abd: Nondistended Extremities: no calf tenderness, no edema Vascular: Palpable radial and DP pulses bilaterally Neuro: GCS 3 Skin: no rashes   ED Results / Procedures / Treatments   Labs (all labs ordered are listed, but only abnormal results are  displayed) Labs Reviewed  BASIC METABOLIC PANEL  CBC  I-STAT VENOUS BLOOD GAS, ED  CBG MONITORING, ED  TROPONIN I (HIGH SENSITIVITY)    EKG None  Radiology No results found.  Procedures Procedures  {Document cardiac monitor, telemetry assessment procedure when appropriate:1}  Medications Ordered in ED Medications - No data to display  ED Course/ Medical Decision Making/ A&P   {   Click here for ABCD2, HEART and other calculatorsREFRESH Note before signing :1}                              Medical Decision  Making 88 year old female with past medical history of hypertension and dementia presenting to the emergency department today from her skilled nursing facility unresponsive and apneic.  The patient is intubated on arrival.  I will obtain basic labs here as well as a venous blood gas to eval for acidosis.  Will obtain a lactic acid as well to screen for seizure activity given the patient's initial presentation.  I will obtain an x-ray here to evaluate for pulmonary edema although during intubation I do not appreciate any significant edema at that time.  I will obtain CT scan of her head to evaluate for  Amount and/or Complexity of Data Reviewed Labs: ordered. Radiology: ordered.   ***  {Document critical care time when appropriate:1} {Document review of labs and clinical decision tools ie heart score, Chads2Vasc2 etc:1}  {Document your independent review of radiology images, and any outside records:1} {Document your discussion with family members, caretakers, and with consultants:1} {Document social determinants of health affecting pt's care:1} {Document your decision making why or why not admission, treatments were needed:1} Final Clinical Impression(s) / ED Diagnoses Final diagnoses:  None    Rx / DC Orders ED Discharge Orders     None

## 2023-10-02 NOTE — ED Triage Notes (Signed)
Patient coming from nursing home [facility]. Hx of dementia. Was talking with family when she started seizing and foaming at the mouth. Last known well 1900. Patient is not alert, she was being bagged by EMS. She is a full code.HTN in route. Pulse maintained whole time.

## 2023-10-03 ENCOUNTER — Inpatient Hospital Stay (HOSPITAL_COMMUNITY): Payer: Medicare Other

## 2023-10-03 DIAGNOSIS — R569 Unspecified convulsions: Secondary | ICD-10-CM | POA: Diagnosis not present

## 2023-10-03 DIAGNOSIS — G9341 Metabolic encephalopathy: Secondary | ICD-10-CM | POA: Diagnosis not present

## 2023-10-03 DIAGNOSIS — R0603 Acute respiratory distress: Secondary | ICD-10-CM

## 2023-10-03 DIAGNOSIS — R4189 Other symptoms and signs involving cognitive functions and awareness: Secondary | ICD-10-CM | POA: Diagnosis not present

## 2023-10-03 LAB — ECHOCARDIOGRAM COMPLETE
AR max vel: 2.43 cm2
AV Area VTI: 2.73 cm2
AV Area mean vel: 2.38 cm2
AV Mean grad: 5 mm[Hg]
AV Peak grad: 7.5 mm[Hg]
Ao pk vel: 1.37 m/s
Area-P 1/2: 2.1 cm2
Height: 62 in
S' Lateral: 1.8 cm
Weight: 1583.78 [oz_av]

## 2023-10-03 LAB — CBC
HCT: 33.6 % — ABNORMAL LOW (ref 36.0–46.0)
HCT: 35.8 % — ABNORMAL LOW (ref 36.0–46.0)
Hemoglobin: 10.2 g/dL — ABNORMAL LOW (ref 12.0–15.0)
Hemoglobin: 10.9 g/dL — ABNORMAL LOW (ref 12.0–15.0)
MCH: 28.5 pg (ref 26.0–34.0)
MCH: 28.6 pg (ref 26.0–34.0)
MCHC: 30.4 g/dL (ref 30.0–36.0)
MCHC: 30.4 g/dL (ref 30.0–36.0)
MCV: 93.9 fL (ref 80.0–100.0)
MCV: 94 fL (ref 80.0–100.0)
Platelets: 196 10*3/uL (ref 150–400)
Platelets: 225 10*3/uL (ref 150–400)
RBC: 3.58 MIL/uL — ABNORMAL LOW (ref 3.87–5.11)
RBC: 3.81 MIL/uL — ABNORMAL LOW (ref 3.87–5.11)
RDW: 13.8 % (ref 11.5–15.5)
RDW: 13.8 % (ref 11.5–15.5)
WBC: 10.3 10*3/uL (ref 4.0–10.5)
WBC: 11.3 10*3/uL — ABNORMAL HIGH (ref 4.0–10.5)
nRBC: 0 % (ref 0.0–0.2)
nRBC: 0 % (ref 0.0–0.2)

## 2023-10-03 LAB — POCT I-STAT 7, (LYTES, BLD GAS, ICA,H+H)
Acid-Base Excess: 8 mmol/L — ABNORMAL HIGH (ref 0.0–2.0)
Acid-Base Excess: 8 mmol/L — ABNORMAL HIGH (ref 0.0–2.0)
Bicarbonate: 33.5 mmol/L — ABNORMAL HIGH (ref 20.0–28.0)
Bicarbonate: 35.1 mmol/L — ABNORMAL HIGH (ref 20.0–28.0)
Calcium, Ion: 1.33 mmol/L (ref 1.15–1.40)
Calcium, Ion: 1.3 mmol/L (ref 1.15–1.40)
HCT: 31 % — ABNORMAL LOW (ref 36.0–46.0)
HCT: 31 % — ABNORMAL LOW (ref 36.0–46.0)
Hemoglobin: 10.5 g/dL — ABNORMAL LOW (ref 12.0–15.0)
Hemoglobin: 10.5 g/dL — ABNORMAL LOW (ref 12.0–15.0)
O2 Saturation: 99 %
O2 Saturation: 42 %
Patient temperature: 36.9
Patient temperature: 100
Potassium: 3.6 mmol/L (ref 3.5–5.1)
Potassium: 3.9 mmol/L (ref 3.5–5.1)
Sodium: 141 mmol/L (ref 135–145)
Sodium: 142 mmol/L (ref 135–145)
TCO2: 35 mmol/L — ABNORMAL HIGH (ref 22–32)
TCO2: 37 mmol/L — ABNORMAL HIGH (ref 22–32)
pCO2 arterial: 49.7 mm[Hg] — ABNORMAL HIGH (ref 32–48)
pCO2 arterial: 61.5 mm[Hg] — ABNORMAL HIGH (ref 32–48)
pH, Arterial: 7.436 (ref 7.35–7.45)
pH, Arterial: 7.368 (ref 7.35–7.45)
pO2, Arterial: 128 mm[Hg] — ABNORMAL HIGH (ref 83–108)
pO2, Arterial: 27 mm[Hg] — CL (ref 83–108)

## 2023-10-03 LAB — PROCALCITONIN: Procalcitonin: 0.14 ng/mL

## 2023-10-03 LAB — PHOSPHORUS: Phosphorus: 3.4 mg/dL (ref 2.5–4.6)

## 2023-10-03 LAB — BASIC METABOLIC PANEL
Anion gap: 11 (ref 5–15)
BUN: 12 mg/dL (ref 8–23)
CO2: 28 mmol/L (ref 22–32)
Calcium: 9.7 mg/dL (ref 8.9–10.3)
Chloride: 100 mmol/L (ref 98–111)
Creatinine, Ser: 0.46 mg/dL (ref 0.44–1.00)
GFR, Estimated: >60 mL/min (ref >60)
Glucose, Bld: 99 mg/dL (ref 70–99)
Potassium: 4 mmol/L (ref 3.5–5.1)
Sodium: 139 mmol/L (ref 135–145)

## 2023-10-03 LAB — STREP PNEUMONIAE URINARY ANTIGEN: Strep Pneumo Urinary Antigen: NEGATIVE

## 2023-10-03 LAB — GLUCOSE, CAPILLARY
Glucose-Capillary: 84 mg/dL (ref 70–99)
Glucose-Capillary: 88 mg/dL (ref 70–99)
Glucose-Capillary: 86 mg/dL (ref 70–99)
Glucose-Capillary: 81 mg/dL (ref 70–99)
Glucose-Capillary: 93 mg/dL (ref 70–99)
Glucose-Capillary: 104 mg/dL — ABNORMAL HIGH (ref 70–99)

## 2023-10-03 LAB — AMMONIA: Ammonia: 20 umol/L (ref 9–35)

## 2023-10-03 LAB — LACTIC ACID, PLASMA
Lactic Acid, Venous: 2.6 mmol/L (ref 0.5–1.9)
Lactic Acid, Venous: 2.4 mmol/L (ref 0.5–1.9)

## 2023-10-03 LAB — MRSA NEXT GEN BY PCR, NASAL: MRSA by PCR Next Gen: NOT DETECTED

## 2023-10-03 LAB — CREATININE, SERUM
Creatinine, Ser: 0.5 mg/dL (ref 0.44–1.00)
GFR, Estimated: >60 mL/min (ref >60)

## 2023-10-03 LAB — MAGNESIUM: Magnesium: 1.8 mg/dL (ref 1.7–2.4)

## 2023-10-03 LAB — HEMOGLOBIN A1C
Hgb A1c MFr Bld: 4.7 % — ABNORMAL LOW (ref 4.8–5.6)
Mean Plasma Glucose: 88.19 mg/dL

## 2023-10-03 LAB — TROPONIN I (HIGH SENSITIVITY)
Troponin I (High Sensitivity): 470 ng/L (ref <18)
Troponin I (High Sensitivity): 569 ng/L (ref <18)

## 2023-10-03 LAB — TRIGLYCERIDES: Triglycerides: 38 mg/dL (ref <150)

## 2023-10-03 LAB — TSH: TSH: 0.893 u[IU]/mL (ref 0.350–4.500)

## 2023-10-03 MED ORDER — LACTATED RINGERS IV SOLN: Freq: Once | INTRAVENOUS | Status: AC

## 2023-10-03 MED ORDER — THIAMINE HCL 100 MG/ML IJ SOLN
100.0000 mg | INTRAMUSCULAR | Status: DC
  Administered 2023-10-05: 100 mg via INTRAVENOUS
  Filled 2023-10-03: qty 2

## 2023-10-03 MED ORDER — THIAMINE HCL 100 MG/ML IJ SOLN
500.0000 mg | Freq: Three times a day (TID) | INTRAVENOUS | Status: AC
  Administered 2023-10-03 – 2023-10-05 (×6): 500 mg via INTRAVENOUS
  Filled 2023-10-03 (×6): qty 5

## 2023-10-03 MED ORDER — ORAL CARE MOUTH RINSE
15.0000 mL | OROMUCOSAL | Status: DC | PRN
Start: 2023-10-03 — End: 2023-10-07

## 2023-10-03 NOTE — Progress Notes (Addendum)
Date and time results received: 10/03/23 0104 (use smartphrase ".now" to insert current time)  Test: Lactate Critical Value: 2.6  Name of Provider Notified: eLink  Orders Received? Or Actions Taken?: Orders Received - See Orders for details  Attempted OFT tube placement x2 and NGT placement x1 without success. eLink notified

## 2023-10-03 NOTE — TOC Initial Note (Signed)
Transition of Care [facility]) - Initial/Assessment Note    Patient Details  Name: Chloe Wilcox MRN: [id number] Date of Birth: 30-Mar-1935  [facility]) CM/SW Contact:    Mearl Latin, LCSW Phone Number: 10/03/2023, 9:24 AM  Clinical Narrative:                 Patient was admitted from [facility] under private pay and is currently intubated. CSW will continue to follow.   Expected Discharge Plan: Skilled Nursing Facility Barriers to Discharge: Continued Medical Work up   Patient Goals and CMS Choice Patient states their goals for this hospitalization and ongoing recovery are:: Return to SNF          Expected Discharge Plan and Services In-house Referral: Clinical Social Work   Post Acute Care Choice: Skilled Nursing Facility Living arrangements for the past 2 months: Skilled Nursing Facility                                      Prior Living Arrangements/Services Living arrangements for the past 2 months: Skilled Nursing Facility Lives with:: Facility Resident Patient language and need for interpreter reviewed:: Yes Do you feel safe going back to the place where you live?: Yes      Need for Family Participation in Patient Care: Yes (Comment) Care giver support system in place?: Yes (comment)   Criminal Activity/Legal Involvement Pertinent to Current Situation/Hospitalization: No - Comment as needed  Activities of Daily Living      Permission Sought/Granted Permission sought to share information with : Facility Medical sales representative, Family Supports                Emotional Assessment Appearance:: Appears stated age Attitude/Demeanor/Rapport: Intubated (Following Commands or Not Following Commands) Affect (typically observed): Unable to Assess   Alcohol / Substance Use: Not Applicable Psych Involvement: No (comment)  Admission diagnosis:  Unresponsive [R41.89] Hypertensive urgency [I16.0] Acute metabolic encephalopathy  [G93.41] Patient Active Problem List   Diagnosis Date Noted   Acute metabolic encephalopathy 10/02/2023   Closed displaced intertrochanteric fracture of right femur (HCC) 07/10/2023   Fall 07/10/2023   Depression 07/10/2023   Hyperlipidemia 07/10/2023   Anemia of chronic disease 07/10/2023   History of COVID-19 09/14/2020   Abnormal findings on diagnostic imaging of lung 09/14/2020   Hypoxemia 07/23/2020   Respiratory failure (HCC) 06/15/2019   Acute respiratory failure with hypoxia (HCC) 06/14/2019   COPD (chronic obstructive pulmonary disease) (HCC) 06/14/2019   COPD with acute bronchitis (HCC) 06/14/2019   Hypokalemia 06/14/2019   Essential hypertension 06/14/2019   PCP:  Noberto Retort, MD Pharmacy:   [facility] [phone] [place], [place] - [address] [place] [address] Phone: [phone] Fax: [phone]  [id number] - [place], [place] - [address] AT [facility] [address] [place] [address] Phone: [phone] Fax: [phone]  [facility] - [place], [place] - [address] [place] [address] Phone: [phone] Fax: [phone]     Social Determinants of Health (SDOH) Social History: SDOH Screenings   Food Insecurity: No Food Insecurity (07/10/2023)  Housing: Low Risk  (07/10/2023)  Transportation Needs: No Transportation Needs (07/10/2023)  Utilities: Not At Risk (07/10/2023)  Tobacco Use: Medium Risk (07/10/2023)   SDOH Interventions:     Readmission Risk Interventions  No data to display

## 2023-10-03 NOTE — Progress Notes (Signed)
EEG complete - results pending 

## 2023-10-03 NOTE — Procedures (Signed)
Patient Name: BRYANNAH BOSTON  MRN: [id number]  Epilepsy Attending: Charlsie Quest  Referring Physician/Provider: Simonne Martinet, NP  Date: 10/03/2023  Duration: 24.39 mins  Patient history: 88 year old female w/ extensive medical hx as outlined below, currently resides at [facility] since her hip fracture in Aug 2024. Per Hx was talking w/ family and suddenly started seizing and foaming at mouth. On EMS arrival was unresponsive, had some facial twitching but before they could medicate for possible seizure she became apneic. EEG to evaluate for seizure  Level of alertness: comatose/ lethargic   AEDs during EEG study: LEV  Technical aspects: This EEG study was done with scalp electrodes positioned according to the 10-20 International system of electrode placement. Electrical activity was reviewed with band pass filter of 1-70Hz , sensitivity of 7 uV/mm, display speed of 22mm/sec with a 60Hz  notched filter applied as appropriate. EEG data were recorded continuously and digitally stored.  Video monitoring was available and reviewed as appropriate.  Description: EEG showed continuous generalized 3 to 5 Hz theta-delta slowing, at times with triphasic morphology admixed with 15 to 18 Hz beta activity distributed symmetrically and diffusely.  Sharp transients were noted in left frontal region. Hyperventilation and photic stimulation were not performed.     ABNORMALITY - Continuous slow, generalized  IMPRESSION: This study is suggestive of severe diffuse encephalopathy, likely secondary to toxin-metabolic causes. No seizures or definite epileptiform discharges were seen throughout the recording.  Please note lack of epileptiform activity during interictal EEG does not exclude the diagnosis of epilepsy.  Justyn Boyson Annabelle Harman

## 2023-10-03 NOTE — Procedures (Signed)
Extubation Procedure Note  Patient Details:   Name: Chloe Wilcox DOB: 1935/05/23 MRN: [id number]   Airway Documentation:    Vent end date: 10/03/23 Vent end time: 1533   Evaluation  O2 sats: stable throughout Complications: No apparent complications Patient did tolerate procedure well. Bilateral Breath Sounds: Clear, Diminished   Yes  RT extubated pt per order to 4L [place]. Pt had a positive cuff leak, able to speak softly and no stridor was noted.   Kerri Perches 10/03/2023, 3:33 PM

## 2023-10-03 NOTE — Progress Notes (Signed)
Transported patient to MRI while patient was on the ventilator.   

## 2023-10-03 NOTE — Progress Notes (Signed)
   NAME:  Chloe Wilcox, MRN:  [id number], DOB:  December 12, 1934, LOS: 1 ADMISSION DATE:  10/02/2023, CONSULTATION DATE:  11/4 REFERRING MD:  Rhae Hammock, CHIEF COMPLAINT:  seizure and respiratory failure    History of Present Illness:  88 year old female w/ extensive medical hx as outlined below, currently resides at [facility] since her hip fracture in Aug 2024. Per Hx was found by staff w/ altered mentation, seizure activity and foaming at mouth.  On EMS arrival was unresponsive, had some facial twitching but before they could medicate for possible seizure she became apneic. Transferred to ER w/ bag mask ventilation. Intubated shortly after arrival by EDP.   ER eval  Recorded MAP 160  w/ SBP >230s and dbp >120s Glucose 197, wbc 13.5 hgb 10.9 VBG 7.17/pco2 101/o2 201/bicarb 40 CXR ETT good position. Hyperinflated. Air in stomach. No infiltrates or edema  CT head: neg for acute process CT angio negative for LVO   PCCM asked to admit   Pertinent  Medical History  Remote smoker, COPD (presumptive...could not complete PFTs), chronic hypoxic resp failure on oxygen (3-4 lpm baseline), depression, HTN, hyperlipidemia, FESO4 def anemia,  Hip fracture August 2024 s/p mechanical fall requiring IM nailing of the right hip   Significant Hospital Events: Including procedures, antibiotic start and stop dates in addition to other pertinent events   11/4 admitted w/ working dx of hypertensive encephalopathy and HCRF initial MAP 160  Interim History / Subjective:  Back from MRI Starting to gag on ETT off sedation  Objective   Blood pressure 130/64, pulse 73, temperature 98.4 F (36.9 C), temperature source Axillary, resp. rate 12, height 5\' 2"  (1.575 m), weight 44.9 kg, SpO2 100%.    Vent Mode: PRVC FiO2 (%):  [40 %-100 %] 40 % Set Rate:  [12 bmp-16 bmp] 12 bmp Vt Set:  [400 mL-500 mL] 400 mL PEEP:  [5 cmH20] 5 cmH20 Plateau Pressure:  [16 cmH20-17 cmH20] 16 cmH20   Intake/Output Summary (Last 24  hours) at 10/03/2023 2130 Last data filed at 10/03/2023 0600 Gross per 24 hour  Intake 316.2 ml  Output 350 ml  Net -33.8 ml   Filed Weights   10/02/23 2100 10/03/23 0500  Weight: 57 kg 44.9 kg    Examination: Frail elderly woman on vent Vent mechanics good Triggers vent Winces and localizes to pain +muscle wasting Abd soft  Resolved Hospital Problem list     Assessment & Plan:  Acute on Chronic Hypercarbic respiratory failure.  Baseline COPD Hypertensive crisis MRI reassuring R/o status epilepticus- cEEG ongoing Remote bilateral cerebellar infarcts Incontinence Severe protein calorie malnutrition POA Fraily elderly Large fibroid uterus Hip fx in Aug 2024 Chronic anemia  - Hold all sedation - Check TSH, ammonia - Empiric thiamine - Continue bronchodilators - CCB gtt targeting SBP < 180 (has self-corrected) - Depending on SBT may extubate this am  Best Practice (right click and "Reselect all SmartList Selections" daily)   Diet/type: NPO DVT prophylaxis: prophylactic heparin  GI prophylaxis: H2B Lines: N/A Foley:  Yes, and it is still needed Code Status:  full code Last date of multidisciplinary goals of care discussion [pending ] Would benefit from palliative consult prior to DC given recent weight loss and worsening cachexia  34 min cc time Myrla Halsted MD PCCM

## 2023-10-03 NOTE — Progress Notes (Signed)
Not available for EEG at the moment. Pt will be going to MRI shortly , will check back for availability as schedule allows. qm

## 2023-10-03 NOTE — Plan of Care (Signed)
Patient admitted s/p seizure at rehab facility. Upon initial assessment patient was found to have increased drowsiness, became more alert throughout the day. Able to be successfully extubated, remains on 4L nasal canula. Patient and family updated in plan of care. ICU status maintained.   Problem: Education: Goal: Ability to describe self-care measures that may prevent or decrease complications (Diabetes Survival Skills Education) will improve Outcome: Progressing Goal: Individualized Educational Video(s) Outcome: Progressing   Problem: Coping: Goal: Ability to adjust to condition or change in health will improve Outcome: Progressing   Problem: Fluid Volume: Goal: Ability to maintain a balanced intake and output will improve Outcome: Progressing   Problem: Health Behavior/Discharge Planning: Goal: Ability to identify and utilize available resources and services will improve Outcome: Progressing Goal: Ability to manage health-related needs will improve Outcome: Progressing   Problem: Metabolic: Goal: Ability to maintain appropriate glucose levels will improve Outcome: Progressing   Problem: Nutritional: Goal: Maintenance of adequate nutrition will improve Outcome: Progressing Goal: Progress toward achieving an optimal weight will improve Outcome: Progressing   Problem: Skin Integrity: Goal: Risk for impaired skin integrity will decrease Outcome: Progressing   Problem: Tissue Perfusion: Goal: Adequacy of tissue perfusion will improve Outcome: Progressing   Problem: Education: Goal: Knowledge of General Education information will improve Description: Including pain rating scale, medication(s)/side effects and non-pharmacologic comfort measures Outcome: Progressing   Problem: Health Behavior/Discharge Planning: Goal: Ability to manage health-related needs will improve Outcome: Progressing   Problem: Clinical Measurements: Goal: Ability to maintain clinical measurements  within normal limits will improve Outcome: Progressing Goal: Will remain free from infection Outcome: Progressing Goal: Diagnostic test results will improve Outcome: Progressing Goal: Respiratory complications will improve Outcome: Progressing Goal: Cardiovascular complication will be avoided Outcome: Progressing   Problem: Activity: Goal: Risk for activity intolerance will decrease Outcome: Progressing   Problem: Nutrition: Goal: Adequate nutrition will be maintained Outcome: Progressing   Problem: Coping: Goal: Level of anxiety will decrease Outcome: Progressing   Problem: Elimination: Goal: Will not experience complications related to bowel motility Outcome: Progressing Goal: Will not experience complications related to urinary retention Outcome: Progressing   Problem: Pain Management: Goal: General experience of comfort will improve Outcome: Progressing   Problem: Safety: Goal: Ability to remain free from injury will improve Outcome: Progressing   Problem: Skin Integrity: Goal: Risk for impaired skin integrity will decrease Outcome: Progressing

## 2023-10-04 ENCOUNTER — Inpatient Hospital Stay (HOSPITAL_COMMUNITY): Payer: Medicare Other

## 2023-10-04 DIAGNOSIS — G9341 Metabolic encephalopathy: Secondary | ICD-10-CM | POA: Diagnosis not present

## 2023-10-04 LAB — GLUCOSE, CAPILLARY
Glucose-Capillary: 85 mg/dL (ref 70–99)
Glucose-Capillary: 88 mg/dL (ref 70–99)
Glucose-Capillary: 98 mg/dL (ref 70–99)
Glucose-Capillary: 133 mg/dL — ABNORMAL HIGH (ref 70–99)
Glucose-Capillary: 115 mg/dL — ABNORMAL HIGH (ref 70–99)

## 2023-10-04 LAB — MYOGLOBIN, URINE: Myoglobin, Ur: 13 ng/mL (ref 0–13)

## 2023-10-04 MED ORDER — ASPIRIN 81 MG PO CHEW
81.0000 mg | CHEWABLE_TABLET | Freq: Every day | ORAL | Status: DC
  Administered 2023-10-05 – 2023-10-13 (×8): 81 mg via ORAL
  Filled 2023-10-04 (×9): qty 1

## 2023-10-04 MED ORDER — POLYETHYLENE GLYCOL 3350 17 G PO PACK
17.0000 g | PACK | Freq: Every day | ORAL | Status: DC
  Administered 2023-10-04 – 2023-10-05 (×2): 17 g via ORAL
  Filled 2023-10-04 (×2): qty 1

## 2023-10-04 MED ORDER — DOCUSATE SODIUM 50 MG/5ML PO LIQD
100.0000 mg | Freq: Two times a day (BID) | ORAL | Status: DC
  Administered 2023-10-04 – 2023-10-05 (×3): 100 mg via ORAL
  Filled 2023-10-04 (×3): qty 10

## 2023-10-04 MED ORDER — ASPIRIN 300 MG RE SUPP
300.0000 mg | Freq: Every day | RECTAL | Status: DC
  Administered 2023-10-10: 300 mg via RECTAL
  Filled 2023-10-04 (×3): qty 1

## 2023-10-04 MED ORDER — ATORVASTATIN CALCIUM 40 MG PO TABS
40.0000 mg | ORAL_TABLET | Freq: Every day | ORAL | Status: DC
  Administered 2023-10-04 – 2023-10-12 (×9): 40 mg via ORAL
  Filled 2023-10-04 (×9): qty 1

## 2023-10-04 MED ORDER — ENSURE ENLIVE PO LIQD
237.0000 mL | Freq: Three times a day (TID) | ORAL | Status: DC
  Administered 2023-10-04 – 2023-10-13 (×26): 237 mL via ORAL

## 2023-10-04 MED ORDER — CITALOPRAM HYDROBROMIDE 10 MG PO TABS
10.0000 mg | ORAL_TABLET | Freq: Every day | ORAL | Status: DC
  Administered 2023-10-04 – 2023-10-13 (×10): 10 mg via ORAL
  Filled 2023-10-04 (×10): qty 1

## 2023-10-04 MED ORDER — ACETAMINOPHEN 325 MG PO TABS
650.0000 mg | ORAL_TABLET | Freq: Four times a day (QID) | ORAL | Status: DC | PRN
  Administered 2023-10-04 – 2023-10-08 (×4): 650 mg
  Filled 2023-10-04 (×4): qty 2

## 2023-10-04 NOTE — Progress Notes (Signed)
Afternoon rounds: to transfer out of ICU tomorrow to [facility]. A bit clearer mental status this afternoon; although, she went down for esophogram and was unable to complete due to agitation/cooperation issues. Likely mental status related. She did have bedside swallow eval, placed her on dysphagia 1. Daughter was updated over the phone by myself this AM.

## 2023-10-04 NOTE — Progress Notes (Signed)
NAME:  Chloe Wilcox, MRN:  [id number], DOB:  [birth year], LOS: 2 ADMISSION DATE:  10/02/2023, CONSULTATION DATE:  11/4 REFERRING MD:  Rhae Hammock, CHIEF COMPLAINT:  seizure and respiratory failure    History of Present Illness:  88 year old female w/ extensive medical hx as outlined below, currently resides at [facility] since her hip fracture in Aug 2024. Per Hx was found by staff w/ altered mentation, seizure activity and foaming at mouth.  On EMS arrival was unresponsive, had some facial twitching but before they could medicate for possible seizure she became apneic. Transferred to ER w/ bag mask ventilation. Intubated shortly after arrival by EDP.   ER eval  Recorded MAP 160  w/ SBP >230s and dbp >120s Glucose 197, wbc 13.5 hgb 10.9 VBG 7.17/pco2 101/o2 201/bicarb 40 CXR ETT good position. Hyperinflated. Air in stomach. No infiltrates or edema  CT head: neg for acute process CT angio negative for LVO   PCCM asked to admit   Pertinent  Medical History  Remote smoker, COPD (presumptive...could not complete PFTs), chronic hypoxic resp failure on oxygen (3-4 lpm baseline), depression, HTN, hyperlipidemia, FESO4 def anemia,  Hip fracture August 2024 s/p mechanical fall requiring IM nailing of the right hip   Significant Hospital Events: Including procedures, antibiotic start and stop dates in addition to other pertinent events   11/4 admitted w/ working dx of hypertensive encephalopathy and HCRF initial MAP 160 11/5: off cleviprex gtt, self regulated, extubated 11/6: out of ICU. Needs esophogram, speech.   Interim History / Subjective:  Altered, she denies pain. Repeats herself a bit but does tell me her name   Objective   Blood pressure (!) 132/59, pulse 93, temperature 98.4 F (36.9 C), temperature source Oral, resp. rate (!) 24, height 5\' 2"  (1.575 m), weight 46.8 kg, SpO2 98%.    Vent Mode: PSV;CPAP FiO2 (%):  [40 %] 40 % PEEP:  [5 cmH20] 5 cmH20 Pressure Support:  [5 cmH20-8  cmH20] 8 cmH20   Intake/Output Summary (Last 24 hours) at 10/04/2023 0710 Last data filed at 10/04/2023 0600 Gross per 24 hour  Intake 1917.59 ml  Output 645 ml  Net 1272.59 ml   Filed Weights   10/02/23 2100 10/03/23 0500 10/04/23 0500  Weight: 57 kg 44.9 kg 46.8 kg    Examination: General: elderly female, frail, laying in bed in no acute distress HENT: difficulty opening the right lid but PERRLA when able to evaluate the right pupil, anicteric sclera, MM dry Lungs: clear bilaterally Cardiac: s1/s2 w/o murmur, rub, gallop Abdomen: soft +BS, non tender  Extremities: no pitting edema, muscle wasting Neuro: awake, alert, confused, oriented to self. Repeats questions. Generally weak   Resolved Hospital Problem list   Acute on chronic hypercarbic respiratory failure  Assessment & Plan:  Hypertensive Crisis Altered Mental Status  Remote bilateral cerebellar infarcts  R/o status epilepticus  Confusion on admission, concern for status epilepticus with apparent seizure activity at SNF. MRI reassuring. cEEG negative for seizures. TSH, ammonia negative.  - BP self regulated, off Cleviprex - restart olmesartan if SBP >160 - con't EEG negative for seizures, con't keppra 500 BID  - con't empiric thiamine  Elevated Troponins; likely demand from above  - will not consult cards at this time - EKG without ischemic findings  - will not anticoagulate   COPD - con't brovana, pulmicort, yupelri   Severe protein calorie malnutrition POA Fraily elderly - esophogram today. Report of difficulty swallowing at facility  - will  need cortrak if unable to swallow  Large fibroid uterus Hip fx in Aug 2024 Chronic anemia  Best Practice (right click and "Reselect all SmartList Selections" daily)   Diet/type: NPO DVT prophylaxis: prophylactic heparin  GI prophylaxis: N/A Lines: N/A Foley:  Yes, and it is still needed Code Status:  full code Last date of multidisciplinary goals of care  discussion [pending ] Would benefit from palliative consult prior to DC given recent weight loss and worsening cachexia  CC Time: 35 minutes  Karma Greaser [facility] 10/04/2023, 9:05 AM  Please see [email or web address] for pager details.  From 7A-7P if no response, please call [phone]. After hours, please call ELink [phone].

## 2023-10-04 NOTE — Plan of Care (Signed)
Patient admitted s/p seizure event at rehab facility. Upon initial assessment patient seemed to be much more alert and oriented to self. Speech at bedside, patient able to progress to a dysphagia diet, tolerated well. Patient went down for esophogram, procedure was unsuccessful at this time. Will re-attempt at a later date. Patient and family updated in plan of care.    Problem: Education: Goal: Ability to describe self-care measures that may prevent or decrease complications (Diabetes Survival Skills Education) will improve Outcome: Progressing Goal: Individualized Educational Video(s) Outcome: Progressing   Problem: Coping: Goal: Ability to adjust to condition or change in health will improve Outcome: Progressing   Problem: Fluid Volume: Goal: Ability to maintain a balanced intake and output will improve Outcome: Progressing   Problem: Health Behavior/Discharge Planning: Goal: Ability to identify and utilize available resources and services will improve Outcome: Progressing Goal: Ability to manage health-related needs will improve Outcome: Progressing   Problem: Metabolic: Goal: Ability to maintain appropriate glucose levels will improve Outcome: Progressing   Problem: Nutritional: Goal: Maintenance of adequate nutrition will improve Outcome: Progressing Goal: Progress toward achieving an optimal weight will improve Outcome: Progressing   Problem: Skin Integrity: Goal: Risk for impaired skin integrity will decrease Outcome: Progressing   Problem: Tissue Perfusion: Goal: Adequacy of tissue perfusion will improve Outcome: Progressing   Problem: Education: Goal: Knowledge of General Education information will improve Description: Including pain rating scale, medication(s)/side effects and non-pharmacologic comfort measures Outcome: Progressing   Problem: Health Behavior/Discharge Planning: Goal: Ability to manage health-related needs will improve Outcome: Progressing    Problem: Clinical Measurements: Goal: Ability to maintain clinical measurements within normal limits will improve Outcome: Progressing Goal: Will remain free from infection Outcome: Progressing Goal: Diagnostic test results will improve Outcome: Progressing Goal: Respiratory complications will improve Outcome: Progressing Goal: Cardiovascular complication will be avoided Outcome: Progressing   Problem: Activity: Goal: Risk for activity intolerance will decrease Outcome: Progressing   Problem: Nutrition: Goal: Adequate nutrition will be maintained Outcome: Progressing   Problem: Coping: Goal: Level of anxiety will decrease Outcome: Progressing   Problem: Elimination: Goal: Will not experience complications related to bowel motility Outcome: Progressing Goal: Will not experience complications related to urinary retention Outcome: Progressing   Problem: Pain Management: Goal: General experience of comfort will improve Outcome: Progressing   Problem: Safety: Goal: Ability to remain free from injury will improve Outcome: Progressing   Problem: Skin Integrity: Goal: Risk for impaired skin integrity will decrease Outcome: Progressing

## 2023-10-04 NOTE — Evaluation (Signed)
Clinical/Bedside Swallow Evaluation Patient Details  Name: Chloe Wilcox MRN: [id number] Date of Birth: 03-Mar-1935  Today's Date: 10/04/2023 Time: SLP Start Time (ACUTE ONLY): 1008 SLP Stop Time (ACUTE ONLY): 1030 SLP Time Calculation (min) (ACUTE ONLY): 22 min  Past Medical History:  Past Medical History:  Diagnosis Date   COPD (chronic obstructive pulmonary disease) (HCC)    Depression    Hypertension    Past Surgical History:  Past Surgical History:  Procedure Laterality Date   INTRAMEDULLARY (IM) NAIL INTERTROCHANTERIC Right 07/10/2023   Procedure: INTRAMEDULLARY (IM) NAIL INTERTROCHANTERIC;  Surgeon: Jones Broom, MD;  Location: [facility];  Service: Orthopedics;  Laterality: Right;   VAGINAL HYSTERECTOMY     HPI:  Chloe Wilcox is an 88 yo female presenting to ED 11/4 with AMS after seizure activity. Intubated 11/4-11/5. MRI Brain negative. EEG with severe diffuse encephalopathy. Per MD, concern for esophageal stricture as NGT has been unable to pass. PMH includes dementia, HTN    Assessment / Plan / Recommendation  Clinical Impression  Pt alert, although significantly confused and disoriented. She is perseverative and repetitively praying throughout the session. She followed all commands to complete an oral motor exam, which was [facility]. Note missing dentition in poor condition. Pt's goddaughter, Claris Che, states that pt has some difficulty chewing and typically fully masticates solids before expectorating them in full, but does not typically have difficulty with liquids. Observed pt with trials of thin liquids and purees with no overt s/s of dysphagia. After a significant period of time following PO trials, pt had one instance of a delayed cough, which was strong and congested in quality. Question direct correlation to swallowing. Note frequent eructation which may support suspected esophageal involvement. Recommend initiating diet of Dys 1 texture solids with thin liquids and meds  crushed in purees. Pt will require full supervision and assistance with feeding. Given pt's mentation, question her ability to attend throughout the course of a meal although she participated well throughout this session. Additionally, her goddaughter states that her cognition appears significantly different from baseline which may warrant further assessment through a formal cognitive evaluation which SLP will f/u to complete as able. SLP Visit Diagnosis: Dysphagia, unspecified (R13.10)    Aspiration Risk  Moderate aspiration risk    Diet Recommendation Dysphagia 1 (Puree);Thin liquid    Liquid Administration via: Cup;Straw Medication Administration: Crushed with puree Supervision: Staff to assist with self feeding;Full supervision/cueing for compensatory strategies Compensations: Minimize environmental distractions;Slow rate;Small sips/bites Postural Changes: Seated upright at 90 degrees;Remain upright for at least 30 minutes after po intake    Other  Recommendations Recommended Consults: Consider esophageal assessment Oral Care Recommendations: Oral care QID;Staff/trained caregiver to provide oral care    Recommendations for follow up therapy are one component of a multi-disciplinary discharge planning process, led by the attending physician.  Recommendations may be updated based on patient status, additional functional criteria and insurance authorization.  Follow up Recommendations Skilled nursing-short term rehab (<3 hours/day)      Assistance Recommended at Discharge    Functional Status Assessment Patient has had a recent decline in their functional status and demonstrates the ability to make significant improvements in function in a reasonable and predictable amount of time.  Frequency and Duration min 2x/week  2 weeks       Prognosis Prognosis for improved oropharyngeal function: Fair Barriers to Reach Goals: Cognitive deficits;Time post onset;Severity of deficits       Swallow Study   General HPI: Chloe Wilcox is  an 88 yo female presenting to ED 11/4 with AMS after seizure activity. Intubated 11/4-11/5. MRI Brain negative. EEG with severe diffuse encephalopathy. Per MD, concern for esophageal stricture as NGT has been unable to pass. PMH includes dementia, HTN Type of Study: Bedside Swallow Evaluation Previous Swallow Assessment: none in chart Diet Prior to this Study: NPO Temperature Spikes Noted: No Respiratory Status: Nasal cannula History of Recent Intubation: Yes Total duration of intubation (days): 1 days Date extubated: 10/03/23 Behavior/Cognition: Alert;Cooperative;Distractible;Requires cueing Oral Cavity Assessment: Within Functional Limits Oral Care Completed by SLP: No Oral Cavity - Dentition: Poor condition;Missing dentition Vision: Functional for self-feeding Self-Feeding Abilities: Total assist Patient Positioning: Upright in bed Baseline Vocal Quality: Normal Volitional Cough: Strong;Congested Volitional Swallow: Able to elicit    Oral/Motor/Sensory Function Overall Oral Motor/Sensory Function: Within functional limits   Ice Chips Ice chips: Not tested   Thin Liquid Thin Liquid: Impaired Presentation: Straw Pharyngeal  Phase Impairments: Cough - Delayed    Nectar Thick Nectar Thick Liquid: Not tested   Honey Thick Honey Thick Liquid: Not tested   Puree Puree: Within functional limits Presentation: Spoon   Solid     Solid: Not tested      Gwynneth Aliment, M.A., CF-SLP Speech Language Pathology, Acute Rehabilitation Services  Secure Chat preferred [phone]  10/04/2023,10:44 AM

## 2023-10-04 NOTE — Progress Notes (Signed)
Initial Nutrition Assessment  DOCUMENTATION CODES:   Severe malnutrition in context of social or environmental circumstances  INTERVENTION:   Encourage PO intake  Ensure Enlive po TID, each supplement provides 350 kcal and 20 grams of protein.   NUTRITION DIAGNOSIS:   Severe Malnutrition related to social / environmental circumstances as evidenced by severe muscle depletion, severe fat depletion.  GOAL:   Patient will meet greater than or equal to 90% of their needs  MONITOR:   PO intake, Supplement acceptance  REASON FOR ASSESSMENT:   Consult Assessment of nutrition requirement/status  ASSESSMENT:     Pt with PMH of remote smoker, COPD, depression, HTN, iron def anemia, and hip fx in Aug 2024 s/p IM who was at [facility] since hip fx in Aug 2024.   Pt discussed during ICU rounds and with RN and NP.  Spoke with pt, unintelligible speech and was unable to answer questions. God-daughter at bedside and provides hx. She reports that pt had lost weight prior to falling and breaking her hip and going to rehab. She noticed in rehab that pt could eat foods like mashed potatoes but when she would eat meats she would chew a long time and eventually spit the meat out. She is unsure how long this had been happening.  She reports pt loves Pepsi and ice cream.  Pt was able to pass swallow eval and started on Dysphagia 1 diet with thin liquids. Unable to complete esophagram due to agitation.  Per god-daughter pt's granddaughter makes medical decisions for pt.  Per chart review pt was 125 lb in July prior to her fall. Pt's current weight is 103 lb.  Per god-daughter pt's knees bother her a lot and is not very mobile because of this. They are painful to the touch.    11/5 - extubated   Medications reviewed and include: colace, SSI, miralax, thiamine  Keppra   Labs reviewed:  A1C: 4.7 CBG's: 133    NUTRITION - FOCUSED PHYSICAL EXAM:  Flowsheet Row Most Recent Value   Orbital Region Severe depletion  Upper Arm Region Severe depletion  Thoracic and Lumbar Region Severe depletion  Buccal Region Severe depletion  Temple Region Severe depletion  Clavicle Bone Region Severe depletion  Clavicle and Acromion Bone Region Severe depletion  Scapular Bone Region Severe depletion  Dorsal Hand Severe depletion  Patellar Region Severe depletion  Anterior Thigh Region Severe depletion  Posterior Calf Region Severe depletion  Edema (RD Assessment) None  Hair Reviewed  Eyes Reviewed  Mouth Unable to assess  Skin Reviewed  Nails Reviewed       Diet Order:   Diet Order             DIET - DYS 1 Room service appropriate? Yes with Assist; Fluid consistency: Thin  Diet effective now                   EDUCATION NEEDS:   Not appropriate for education at this time  Skin:  Skin Assessment: Reviewed RN Assessment  Last BM:  11/6; type 2  Height:   Ht Readings from Last 1 Encounters:  10/02/23 5\' 2"  (1.575 m)    Weight:   Wt Readings from Last 1 Encounters:  10/04/23 46.8 kg    Ideal Body Weight:     BMI:  Body mass index is 18.87 kg/m.  Estimated Nutritional Needs:   Kcal:  1300-1500  Protein:  70-85 grams  Fluid:  > 1.5 L/day  Chloe Wilcox P.,  RD, LDN, CNSC See AMiON for contact information

## 2023-10-04 NOTE — Progress Notes (Signed)
Pt. Transfer from 4N- 3W07, A&O x1, 2L [place], calm and cooperative, denies pain.

## 2023-10-05 ENCOUNTER — Other Ambulatory Visit: Payer: Self-pay

## 2023-10-05 ENCOUNTER — Encounter (HOSPITAL_COMMUNITY): Payer: Self-pay | Admitting: Critical Care Medicine

## 2023-10-05 DIAGNOSIS — E43 Unspecified severe protein-calorie malnutrition: Secondary | ICD-10-CM | POA: Insufficient documentation

## 2023-10-05 DIAGNOSIS — G9341 Metabolic encephalopathy: Secondary | ICD-10-CM | POA: Diagnosis not present

## 2023-10-05 LAB — CBC
HCT: 30.9 % — ABNORMAL LOW (ref 36.0–46.0)
Hemoglobin: 9.5 g/dL — ABNORMAL LOW (ref 12.0–15.0)
MCH: 28.2 pg (ref 26.0–34.0)
MCHC: 30.7 g/dL (ref 30.0–36.0)
MCV: 91.7 fL (ref 80.0–100.0)
Platelets: 189 10*3/uL (ref 150–400)
RBC: 3.37 MIL/uL — ABNORMAL LOW (ref 3.87–5.11)
RDW: 13.7 % (ref 11.5–15.5)
WBC: 8.4 10*3/uL (ref 4.0–10.5)
nRBC: 0 % (ref 0.0–0.2)

## 2023-10-05 LAB — BASIC METABOLIC PANEL
Anion gap: 12 (ref 5–15)
BUN: 8 mg/dL (ref 8–23)
CO2: 29 mmol/L (ref 22–32)
Calcium: 9.7 mg/dL (ref 8.9–10.3)
Chloride: 101 mmol/L (ref 98–111)
Creatinine, Ser: 0.4 mg/dL — ABNORMAL LOW (ref 0.44–1.00)
GFR, Estimated: >60 mL/min (ref >60)
Glucose, Bld: 107 mg/dL — ABNORMAL HIGH (ref 70–99)
Potassium: 3.2 mmol/L — ABNORMAL LOW (ref 3.5–5.1)
Sodium: 142 mmol/L (ref 135–145)

## 2023-10-05 LAB — GLUCOSE, CAPILLARY
Glucose-Capillary: 103 mg/dL — ABNORMAL HIGH (ref 70–99)
Glucose-Capillary: 126 mg/dL — ABNORMAL HIGH (ref 70–99)
Glucose-Capillary: 129 mg/dL — ABNORMAL HIGH (ref 70–99)
Glucose-Capillary: 130 mg/dL — ABNORMAL HIGH (ref 70–99)
Glucose-Capillary: 85 mg/dL (ref 70–99)
Glucose-Capillary: 91 mg/dL (ref 70–99)
Glucose-Capillary: 97 mg/dL (ref 70–99)

## 2023-10-05 LAB — CULTURE, RESPIRATORY W GRAM STAIN: Culture: NORMAL

## 2023-10-05 LAB — LEGIONELLA PNEUMOPHILA SEROGP 1 UR AG: L. pneumophila Serogp 1 Ur Ag: NEGATIVE

## 2023-10-05 MED ORDER — POTASSIUM CHLORIDE 20 MEQ PO PACK
40.0000 meq | PACK | Freq: Once | ORAL | Status: AC
  Administered 2023-10-05: 40 meq via ORAL
  Filled 2023-10-05: qty 2

## 2023-10-05 MED ORDER — INSULIN ASPART 100 UNIT/ML IJ SOLN
0.0000 [IU] | Freq: Three times a day (TID) | INTRAMUSCULAR | Status: DC
  Administered 2023-10-05 – 2023-10-07 (×4): 1 [IU] via SUBCUTANEOUS
  Administered 2023-10-10: 2 [IU] via SUBCUTANEOUS

## 2023-10-05 NOTE — Progress Notes (Signed)
Speech Language Pathology Treatment: Dysphagia  Patient Details Name: Chloe Wilcox MRN: [id number] DOB: 02/22/1935 Today's Date: 10/05/2023 Time: 9147-8295 SLP Time Calculation (min) (ACUTE ONLY): 15 min  Assessment / Plan / Recommendation Clinical Impression  Pt presents with improved mentation, is awake and alert, but is still confused. Observed pt with trials of thin liquids, purees, and solids with no overt s/s of aspiration. Pt with increased oral residue following more solids texture trials. Discussed session with pt's daughter and granddaughter on the phone who state pt consumed a regular texture diet PTA. Given current mentation and missing dentition, recommend continuing Dys 1 texture diet with thin liquids. Pt's family verbalize agreement. SLP will follow to assess ability to upgrade diet as clinically indicated.    HPI HPI: Chloe Wilcox is an 88 yo female presenting to ED 11/4 with AMS after seizure activity. Intubated 11/4-11/5. MRI Brain negative. EEG with severe diffuse encephalopathy. Per MD, concern for esophageal stricture as NGT has been unable to pass. PMH includes dementia, HTN      SLP Plan  Continue with current plan of care      Recommendations for follow up therapy are one component of a multi-disciplinary discharge planning process, led by the attending physician.  Recommendations may be updated based on patient status, additional functional criteria and insurance authorization.    Recommendations  Diet recommendations: Dysphagia 1 (puree);Thin liquid Liquids provided via: Straw;Cup Medication Administration: Crushed with puree Supervision: Staff to assist with self feeding;Full supervision/cueing for compensatory strategies;Trained caregiver to feed patient Compensations: Minimize environmental distractions;Slow rate;Small sips/bites Postural Changes and/or Swallow Maneuvers: Seated upright 90 degrees                  Oral care BID;Staff/trained  caregiver to provide oral care   Frequent or constant Supervision/Assistance Dysphagia, unspecified (R13.10)     Continue with current plan of care     Gwynneth Aliment, M.A., CF-SLP Speech Language Pathology, Acute Rehabilitation Services  Secure Chat preferred [phone]   10/05/2023, 4:41 PM

## 2023-10-05 NOTE — Evaluation (Signed)
Physical Therapy Evaluation Patient Details Name: Chloe Wilcox MRN: [id number] DOB: 16-Dec-1934 Today's Date: 10/05/2023  History of Present Illness  Chloe Wilcox is a 88 y.o. female who presented with altered mentation in addition to seizure activity. Intubated 11/4-11/5. PMHx: COPD, chronic respiratory failure on 3-4 L/min, depression, hypertension, hyperlipidemia, iron deficiency anemia, right hip fracture.  Clinical Impression  Pt is from [facility] where pt has been since 8/24 following hip fx. Pt cognition level does not allow for determination of PLOF. Pt limited in safe mobility by decreased cognition, increased confusion, A&Ox1, in presence of pain with LE ROM and decreased strength and balance. Pt is unaware that bed is saturated in urine and requiring gown and linens change. Pt is total Ax2 to come to EoB, mod-max A for maintaining seated balance and totalAx2 to attempt to stand at [facility], unable to attain fully upright. PT recommends return to [facility] at discharge and resume PT if still participating. PT will continue to follow acutely to see if confusion clears and she is able to participate in therapy in hospital.             Can travel by private vehicle   No    Equipment Recommendations None recommended by PT  Recommendations for Other Services       Functional Status Assessment Patient has had a recent decline in their functional status and demonstrates the ability to make significant improvements in function in a reasonable and predictable amount of time.     Precautions / Restrictions Precautions Precautions: Fall Restrictions Weight Bearing Restrictions: No      Mobility  Bed Mobility Overal bed mobility: Needs Assistance Bed Mobility: Supine to Sit, Sit to Supine     Supine to sit: Total assist, +2 for physical assistance, +2 for safety/equipment Sit to supine: Total assist, +2 for physical assistance, +2 for safety/equipment   General bed  mobility comments: pt having difficulty following functional commands    Transfers Overall transfer level: Needs assistance Equipment used: 2 person hand held assist Transfers: Sit to/from Stand Sit to Stand: +2 physical assistance, Total assist, +2 safety/equipment           General transfer comment: unable to stand fully upright          Balance Overall balance assessment: Needs assistance Sitting-balance support: Feet supported, Bilateral upper extremity supported Sitting balance-Leahy Scale: Zero Sitting balance - Comments: mod-total A to correct posterior bias                                     Pertinent Vitals/Pain Pain Assessment Pain Assessment: Faces Faces Pain Scale: Hurts little more Pain Location: R hip with WBing Pain Descriptors / Indicators: Discomfort Pain Intervention(s): Monitored during session    Home Living Family/patient expects to be discharged to:: Skilled nursing facility                   Additional Comments: [facility] since aug 2024    Prior Function Prior Level of Function : Needs assist;Patient poor historian/Family not available             Mobility Comments: pt unable to report - anticipate bed bound ADLs Comments: unable to report - anticipate ADLs with staff assist at bed level     Extremity/Trunk Assessment   Upper Extremity Assessment Upper Extremity Assessment: Defer to OT evaluation  Lower Extremity Assessment Lower Extremity Assessment: RLE deficits/detail;LLE deficits/detail RLE Deficits / Details: knee swelling/OA decreasing pt ability for knee flexion, reports pain when blocking knees to attempt standing LLE Deficits / Details: knee swelling/OA decreasing pt ability for knee flexion, reports pain when blocking knees to attempt standing    Cervical / Trunk Assessment Cervical / Trunk Assessment: Kyphotic  Communication   Communication Communication: Hearing impairment  Cognition  Arousal: Alert Behavior During Therapy: Flat affect Overall Cognitive Status: No family/caregiver present to determine baseline cognitive functioning                                 General Comments: oriented to self only. followed very few functional commands. pleasantly confused throughout        General Comments General comments (skin integrity, edema, etc.): VSS on 3L        Assessment/Plan    PT Assessment Patient needs continued PT services  PT Problem List Decreased range of motion;Decreased activity tolerance;Decreased balance;Decreased mobility;Decreased cognition;Decreased coordination;Pain       PT Treatment Interventions DME instruction;Functional mobility training;Therapeutic activities;Therapeutic exercise;Balance training;Cognitive remediation;Patient/family education    PT Goals (Current goals can be found in the Care Plan section)  Acute Rehab PT Goals PT Goal Formulation: Patient unable to participate in goal setting Time For Goal Achievement: 10/19/23 Potential to Achieve Goals: Poor    Frequency Min 1X/week     Co-evaluation   Reason for Co-Treatment: Complexity of the patient's impairments (multi-system involvement);For patient/therapist safety;To address functional/ADL transfers   OT goals addressed during session: ADL's and self-care       AM-PAC PT "6 Clicks" Mobility  Outcome Measure Help needed turning from your back to your side while in a flat bed without using bedrails?: A Lot Help needed moving from lying on your back to sitting on the side of a flat bed without using bedrails?: A Lot Help needed moving to and from a bed to a chair (including a wheelchair)?: Total Help needed standing up from a chair using your arms (e.g., wheelchair or bedside chair)?: Total Help needed to walk in hospital room?: Total Help needed climbing 3-5 steps with a railing? : Total 6 Click Score: 8    End of Session Equipment Utilized During  Treatment: Gait belt Activity Tolerance: Patient limited by pain;Treatment limited secondary to agitation Patient left: in bed;with call bell/phone within reach;with chair alarm set Nurse Communication: Mobility status;Other (comment) (need to be cleaned up from [facility]) PT Visit Diagnosis: Unsteadiness on feet (R26.81);Muscle weakness (generalized) (M62.81);Difficulty in walking, not elsewhere classified (R26.2);Pain Pain - part of body: Knee    Time: 1103-1120 PT Time Calculation (min) (ACUTE ONLY): 17 min   Charges:   PT Evaluation $PT Eval Moderate Complexity: 1 Mod   PT General Charges $$ ACUTE PT VISIT: 1 Visit         Duana Benedict B. Beverely Risen PT, DPT Acute Rehabilitation Services Please use secure chat or  Call Office [phone]   Elon Alas [facility] 10/05/2023, 2:46 PM

## 2023-10-05 NOTE — TOC Progression Note (Addendum)
Transition of Care [facility]) - Progression Note    Patient Details  Name: Chloe Wilcox MRN: [id number] Date of Birth: 05/23/1935  Transition of Care [facility]) CM/SW Contact  Baldemar Lenis, [place] Phone Number: 10/05/2023, 3:46 PM  Clinical Narrative:   CSW spoke with patient's daughter, Louie Casa, and granddaughter, [name], about return to [facility] at discharge. [name] requested time to discuss with the rest of the family before deciding, as she discussed concerns about [facility] not giving her medications. Upon further discussion, it appears the medications were discontinued upon last hospital admission and not at [facility], but family has questions about why the medications were stopped. CSW unable to answer, sent a message with family's concerns to the MD. [name] and [place] will contact CSW once family has discussed disposition.   CSW contacted [facility] to ensure the patient could return at discharge. Per [facility], patient's baseline prior to admission was stand pivot transfers to the wheelchair with one person assist. Patient could feed herself meals if her tray was setup for her. It appears that patient is not at baseline and could benefit from another rehab stay at discharge, pending family's decision and insurance approval. CSW to follow.    Expected Discharge Plan: Skilled Nursing Facility Barriers to Discharge: Continued Medical Work up, English as a second language teacher  Expected Discharge Plan and Services In-house Referral: Clinical Social Work   Post Acute Care Choice: Skilled Nursing Facility Living arrangements for the past 2 months: Skilled Nursing Facility                                       Social Determinants of Health (SDOH) Interventions SDOH Screenings   Food Insecurity: No Food Insecurity (10/05/2023)  Housing: Low Risk  (10/05/2023)  Transportation Needs: No Transportation Needs (10/05/2023)  Utilities: Not At Risk (10/05/2023)  Tobacco Use: Medium Risk  (10/05/2023)    Readmission Risk Interventions     No data to display

## 2023-10-05 NOTE — Care Management Important Message (Signed)
Important Message  Patient Details  Name: Chloe Wilcox MRN: [id number] Date of Birth: 1935/02/03   Important Message Given:  Yes - Medicare IM     Dorena Bodo 10/05/2023, 2:49 PM

## 2023-10-05 NOTE — Hospital Course (Addendum)
Chloe Wilcox is a 88 y.o. female with a history of likely COPD, chronic respiratory failure on 3-4 L/min, depression, hypertension, hyperlipidemia, iron deficiency anemia, right hip fracture.  Patient presented secondary to altered mentation in addition to seizure active with mouth foaming. Patient was admitted to ICU for a diagnosis of hypertensive emergency with concern for possible seizure activity as well. Patient required short intubation and Cleviprex. Mentation improving slowly. Seizure workup revealed no evidence of seizure.Marland Kitchen

## 2023-10-05 NOTE — Progress Notes (Signed)
PROGRESS NOTE    SELYNA KLAHN  ZOX:[id number] DOB: 04-Dec-1934 DOA: 10/02/2023 PCP: Noberto Retort, MD   Brief Narrative: PIERCE BIAGINI is a 88 y.o. female with a history of likely COPD, chronic respiratory failure on 3-4 L/min, depression, hypertension, hyperlipidemia, iron deficiency anemia, right hip fracture.  Patient presented secondary to altered mentation in addition to seizure active with mouth foaming. Patient was admitted to ICU for a diagnosis of hypertensive emergency with concern for possible seizure activity as well. Patient required short intubation and Cleviprex. Mentation improving slowly. Seizure workup revealed no evidence of seizure..   Assessment and Plan:  Hypertensive emergency Associated encephalopathy and demand ischemia. Patient admitted to ICU on Cleviprex drip which was quickly weaned off. Blood pressure currently managed without scheduled antihypertensives.  Hypertensive encephalopathy Initial concern. Patient managed shortly with Celviprex drip. Status epilepticus unlikely with results of EEG testing.  Acute respiratory failure with hypoxia Patient was intubated on admission, 11/4 and extubated on 11/5. Patient stable on room air. Resolved.  Fever Tmax of 100.4 F yesterday. No leukocytosis. Asymptomatic, per patient. -Trend fever curve  Demand ischemia Elevated troponin without chest pain. In setting of hypertensive emergency. EKG without acute ischemic changes.  Hypokalemia -Potassium supplementation  COPD -Continue Brovana, Pulmicort and Yupelri  Dysphagia Speech therapy recommendations (11/6): Dysphagia 1 (Puree);Thin liquid   Liquid Administration via: Cup;Straw Medication Administration: Crushed with puree Supervision: Staff to assist with self feeding;Full supervision/cueing for compensatory strategies Compensations: Minimize environmental distractions;Slow rate;Small sips/bites Postural Changes: Seated upright at 90 degrees;Remain  upright for at least 30 minutes after po intake   Severe protein malnutrition Dietitian consulted. -Dietitian recommendations: Encourage PO intake Ensure Enlive po TID, each supplement provides 350 kcal and 20 grams of protein.  Chronic anemia Stable. No evidence of hemorrhaging.  History of bilateral cerebellar infarcts Noted. -Continue aspirin  Depression -Continue Celexa  Pressure injury Coccyx, present on admission.   DVT prophylaxis: Heparin subq Code Status:   Code Status: Full Code Family Communication: None at bedside Disposition Plan: Discharge to SNF pending stable fever curve, stable mental status   Consultants:  PCCM Neurology  Procedures:  Intubation/extubation EEG  Antimicrobials: None    Subjective: Patient reports no issues this morning. No concerns from overnight.  Objective: BP (!) 158/85 (BP Location: Right Arm)   Pulse 68   Temp 98.1 F (36.7 C) (Oral)   Resp 18   Ht 5\' 2"  (1.575 m)   Wt 46.9 kg   SpO2 95%   BMI 18.91 kg/m   Examination:  General exam: Appears calm and comfortable Respiratory system: Clear to auscultation. Respiratory effort normal. Cardiovascular system: S1 & S2 heard, RRR. Gastrointestinal system: Abdomen is nondistended, soft and nontender. No organomegaly or masses felt. Normal bowel sounds heard. Central nervous system: Alert and oriented to self only. Musculoskeletal: No edema. No calf tenderness   Data Reviewed: I have personally reviewed following labs and imaging studies  CBC Lab Results  Component Value Date   WBC 10.3 10/03/2023   RBC 3.58 (L) 10/03/2023   HGB 10.5 (L) 10/03/2023   HCT 31.0 (L) 10/03/2023   MCV 93.9 10/03/2023   MCH 28.5 10/03/2023   PLT 196 10/03/2023   MCHC 30.4 10/03/2023   RDW 13.8 10/03/2023   LYMPHSABS 0.6 (L) 07/10/2023   MONOABS 0.6 07/10/2023   EOSABS 0.1 07/10/2023   BASOSABS 0.0 07/10/2023     Last metabolic panel Lab Results  Component Value Date   NA  142 10/03/2023  K 3.9 10/03/2023   CL 100 10/03/2023   CO2 28 10/03/2023   BUN 12 10/03/2023   CREATININE 0.46 10/03/2023   GLUCOSE 99 10/03/2023   GFRNONAA >60 10/03/2023   GFRAA >60 07/25/2020   CALCIUM 9.7 10/03/2023   PHOS 3.4 10/03/2023   PROT 6.1 (L) 07/10/2023   ALBUMIN 3.9 07/10/2023   BILITOT 0.7 07/10/2023   ALKPHOS 64 07/10/2023   AST 17 07/10/2023   ALT 16 07/10/2023   ANIONGAP 11 10/03/2023    GFR: Estimated Creatinine Clearance: 37.4 mL/min (by C-G formula based on SCr of 0.46 mg/dL).  Recent Results (from the past 240 hour(s))  Culture, Respiratory w Gram Stain (tracheal aspirate)     Status: None (Preliminary result)   Collection Time: 10/02/23 11:34 PM   Specimen: Tracheal Aspirate; Respiratory  Result Value Ref Range Status   Specimen Description TRACHEAL ASPIRATE  Final   Special Requests NONE  Final   Gram Stain   Final    FEW WBC PRESENT, PREDOMINANTLY PMN FEW GRAM POSITIVE COCCI    Culture   Final    CULTURE REINCUBATED FOR BETTER GROWTH Performed at [facility], [address], [place], [place] [address]    Report Status PENDING  Incomplete  MRSA Next Gen by PCR, Nasal     Status: None   Collection Time: 10/02/23 11:34 PM   Specimen: Nasal Mucosa; Nasal Swab  Result Value Ref Range Status   MRSA by PCR Next Gen NOT DETECTED NOT DETECTED Final    Comment: (NOTE) The GeneXpert MRSA Assay (FDA approved for NASAL specimens only), is one component of a comprehensive MRSA colonization surveillance program. It is not intended to diagnose MRSA infection nor to guide or monitor treatment for MRSA infections. Test performance is not FDA approved in patients less than 49 years old. Performed at [facility], [address] [place], [place] [address]       Radiology Studies: DG Fluoro Rm 1-60 Min  Result Date: 10/04/2023 CLINICAL DATA:  88 year old admitted with confusion. Barium swallow requested. EXAM: DG C-ARM 1-60 MIN CONTRAST:   None FLUOROSCOPY: Fluoroscopy Time: 0.1 mGy COMPARISON:  None Available. FINDINGS: Scout imaging obtained in anticipation of esophagram. Unfortunately, patient non-participatory in exam due to confusion. IMPRESSION: Patient received scout imaging for barium swallow however ultimately procedure terminated due to confusion and patient's inability to participate. Electronically Signed   By: Leanna Battles M.D.   On: 10/04/2023 15:16   ECHOCARDIOGRAM COMPLETE  Result Date: 10/03/2023    ECHOCARDIOGRAM REPORT   Patient Name:   AMARANTA MEHL Date of Exam: 10/03/2023 Medical Rec #:  [id number]      Height:       62.0 in Accession #:    [id number]     Weight:       99.0 lb Date of Birth:  01/19/1935       BSA:          1.418 m Patient Age:    88 years       BP:           133/68 mmHg Patient Gender: F              HR:           93 bpm. Exam Location:  Inpatient Procedure: 2D Echo, Cardiac Doppler and Color Doppler Indications:     Acutre respiratory distress R06.03  History:         Patient has  prior history of Echocardiogram examinations, most                  recent 06/15/2019. COPD; Risk Factors:Hypertension.  Sonographer:     Darlys Gales Referring Phys:  [id number] Antionette Poles BABCOCK Diagnosing Phys: Lennie Odor MD IMPRESSIONS  1. Left ventricular ejection fraction, by estimation, is 70 to 75%. The left ventricle has hyperdynamic function. The left ventricle has no regional wall motion abnormalities. There is mild left ventricular hypertrophy. Left ventricular diastolic parameters are indeterminate.  2. Right ventricular systolic function is normal. The right ventricular size is normal. There is mildly elevated pulmonary artery systolic pressure. The estimated right ventricular systolic pressure is 38.2 mmHg.  3. The mitral valve is grossly normal. Mild mitral valve regurgitation. No evidence of mitral stenosis.  4. The aortic valve is tricuspid. There is mild calcification of the aortic valve. Aortic valve regurgitation is  not visualized. Aortic valve sclerosis is present, with no evidence of aortic valve stenosis.  5. The inferior vena cava is normal in size with <50% respiratory variability, suggesting right atrial pressure of 8 mmHg. FINDINGS  Left Ventricle: Left ventricular ejection fraction, by estimation, is 70 to 75%. The left ventricle has hyperdynamic function. The left ventricle has no regional wall motion abnormalities. The left ventricular internal cavity size was small. There is mild left ventricular hypertrophy. Left ventricular diastolic parameters are indeterminate. Right Ventricle: The right ventricular size is normal. No increase in right ventricular wall thickness. Right ventricular systolic function is normal. There is mildly elevated pulmonary artery systolic pressure. The tricuspid regurgitant velocity is 2.75  m/s, and with an assumed right atrial pressure of 8 mmHg, the estimated right ventricular systolic pressure is 38.2 mmHg. Left Atrium: Left atrial size was normal in size. Right Atrium: Right atrial size was normal in size. Pericardium: There is no evidence of pericardial effusion. Mitral Valve: The mitral valve is grossly normal. Mild mitral valve regurgitation. No evidence of mitral valve stenosis. Tricuspid Valve: The tricuspid valve is grossly normal. Tricuspid valve regurgitation is mild . No evidence of tricuspid stenosis. Aortic Valve: The aortic valve is tricuspid. There is mild calcification of the aortic valve. Aortic valve regurgitation is not visualized. Aortic valve sclerosis is present, with no evidence of aortic valve stenosis. Aortic valve mean gradient measures 5.0 mmHg. Aortic valve peak gradient measures 7.5 mmHg. Aortic valve area, by VTI measures 2.73 cm. Pulmonic Valve: The pulmonic valve was not well visualized. Pulmonic valve regurgitation is not visualized. Aorta: The aortic root is normal in size and structure. Venous: The inferior vena cava is normal in size with less than 50%  respiratory variability, suggesting right atrial pressure of 8 mmHg. IAS/Shunts: The atrial septum is grossly normal.  LEFT VENTRICLE PLAX 2D LVIDd:         3.10 cm   Diastology LVIDs:         1.80 cm   LV e' medial:    6.42 cm/s LV PW:         1.20 cm   LV E/e' medial:  9.5 LV IVS:        1.00 cm   LV e' lateral:   7.29 cm/s LVOT diam:     1.80 cm   LV E/e' lateral: 8.3 LV SV:         62 LV SV Index:   44 LVOT Area:     2.54 cm  RIGHT VENTRICLE RV S prime:     11.50 cm/s TAPSE (  M-mode): 2.5 cm LEFT ATRIUM             Index        RIGHT ATRIUM           Index LA Vol (A2C):   35.6 ml 25.11 ml/m  RA Area:     10.20 cm LA Vol (A4C):   30.7 ml 21.65 ml/m  RA Volume:   20.00 ml  14.11 ml/m LA Biplane Vol: 35.7 ml 25.18 ml/m  AORTIC VALVE AV Area (Vmax):    2.43 cm AV Area (Vmean):   2.38 cm AV Area (VTI):     2.73 cm AV Vmax:           137.00 cm/s AV Vmean:          101.000 cm/s AV VTI:            0.228 m AV Peak Grad:      7.5 mmHg AV Mean Grad:      5.0 mmHg LVOT Vmax:         131.00 cm/s LVOT Vmean:        94.500 cm/s LVOT VTI:          0.245 m LVOT/AV VTI ratio: 1.07  AORTA Ao Root diam: 2.40 cm MITRAL VALVE               TRICUSPID VALVE MV Area (PHT): 2.10 cm    TR Peak grad:   30.2 mmHg MV Decel Time: 362 msec    TR Vmax:        275.00 cm/s MV E velocity: 60.80 cm/s MV A velocity: 94.30 cm/s  SHUNTS MV E/A ratio:  0.64        Systemic VTI:  0.24 m                            Systemic Diam: 1.80 cm Lennie Odor MD Electronically signed by Lennie Odor MD Signature Date/Time: 10/03/2023/3:15:36 PM    Final (Updated)    EEG adult  Result Date: 10/03/2023 Charlsie Quest, MD     10/03/2023  8:42 AM Patient Name: CARYS MALINA MRN: [id number] Epilepsy Attending: Charlsie Quest Referring Physician/Provider: Simonne Martinet, NP Date: 10/03/2023 Duration: 24.39 mins Patient history: 88 year old female w/ extensive medical hx as outlined below, currently resides at [facility] since her hip fracture in  Aug 2024. Per Hx was talking w/ family and suddenly started seizing and foaming at mouth. On EMS arrival was unresponsive, had some facial twitching but before they could medicate for possible seizure she became apneic. EEG to evaluate for seizure Level of alertness: comatose/ lethargic AEDs during EEG study: LEV Technical aspects: This EEG study was done with scalp electrodes positioned according to the 10-20 International system of electrode placement. Electrical activity was reviewed with band pass filter of 1-70Hz , sensitivity of 7 uV/mm, display speed of 44mm/sec with a 60Hz  notched filter applied as appropriate. EEG data were recorded continuously and digitally stored.  Video monitoring was available and reviewed as appropriate. Description: EEG showed continuous generalized 3 to 5 Hz theta-delta slowing, at times with triphasic morphology admixed with 15 to 18 Hz beta activity distributed symmetrically and diffusely.  Sharp transients were noted in left frontal region. Hyperventilation and photic stimulation were not performed.   ABNORMALITY - Continuous slow, generalized IMPRESSION: This study is suggestive of severe diffuse encephalopathy, likely secondary to toxin-metabolic causes. No seizures or definite  epileptiform discharges were seen throughout the recording. Please note lack of epileptiform activity during interictal EEG does not exclude the diagnosis of epilepsy. Charlsie Quest   DG Chest Port 1 View  Result Date: 10/03/2023 CLINICAL DATA:  Respiratory failure. Ventilator dependence. Evaluate for pneumonia. EXAM: PORTABLE CHEST 1 VIEW COMPARISON:  10/02/2023 FINDINGS: Endotracheal tube tip is approximately 1.5 cm above the base of the carina. The lungs are clear without focal pneumonia, edema, pneumothorax or pleural effusion. Skin fold overlies the upper right lung. The cardiopericardial silhouette is within normal limits for size. Bones are diffusely demineralized. Telemetry leads overlie  the chest. IMPRESSION: 1. Endotracheal tube tip is approximately 1.5 cm above the base of the carina. 2. No acute cardiopulmonary findings. Electronically Signed   By: Kennith Center M.D.   On: 10/03/2023 06:58      LOS: 3 days    Jacquelin Hawking, MD [facility] 10/05/2023, 6:22 AM   If 7PM-7AM, please contact night-coverage [email or web address]

## 2023-10-05 NOTE — Evaluation (Signed)
Occupational Therapy Evaluation Patient Details Name: Chloe Wilcox MRN: [id number] DOB: May 07, 1935 Today's Date: 10/05/2023   History of Present Illness Chloe Wilcox is a 88 y.o. female who presented with altered mentation in addition to seizure activity. Intubated 11/4-11/5. PMHx: COPD, chronic respiratory failure on 3-4 L/min, depression, hypertension, hyperlipidemia, iron deficiency anemia, right hip fracture.   Clinical Impression   Chloe Wilcox was evaluated s/p the above admission list. She is from SNF at baseline and her PLOF is unknown, anticipate pt needs a lot of assist for all mobility and ADLs. Upon evaluation the pt was limited by impaired cognition, confusion, generalized weakness, posterior bias, apparent R hip pain and poor activity tolerance. Overall she needed total A +2 fro all aspects of bed mobility and ADLs. Once sitting EOB pt held a strong posterior bias and was unable to self correct. Standing attempt made with total A +2 to facilitate linen change, pt was unable to get fully upright. Pt will benefit from continued acute OT services and d/c back to skilled inpatient follow up therapy, <3 hours/day.        If plan is discharge home, recommend the following: A lot of help with walking and/or transfers;Two people to help with walking and/or transfers;A lot of help with bathing/dressing/bathroom;Two people to help with bathing/dressing/bathroom;Direct supervision/assist for medications management;Direct supervision/assist for financial management;Assistance with feeding;Assistance with cooking/housework;Help with stairs or ramp for entrance;Assist for transportation;Supervision due to cognitive status    Functional Status Assessment  Patient has had a recent decline in their functional status and demonstrates the ability to make significant improvements in function in a reasonable and predictable amount of time.  Equipment Recommendations  None recommended by OT        Precautions / Restrictions Precautions Precautions: Fall Restrictions Weight Bearing Restrictions: No      Mobility Bed Mobility Overal bed mobility: Needs Assistance Bed Mobility: Supine to Sit, Sit to Supine     Supine to sit: Total assist, +2 for physical assistance, +2 for safety/equipment Sit to supine: Total assist, +2 for physical assistance, +2 for safety/equipment   General bed mobility comments: pt having difficulty following functional commands    Transfers Overall transfer level: Needs assistance Equipment used: 2 person hand held assist Transfers: Sit to/from Stand Sit to Stand: +2 physical assistance, Total assist, +2 safety/equipment           General transfer comment: unable to stand fully upright      Balance Overall balance assessment: Needs assistance Sitting-balance support: Feet supported, Bilateral upper extremity supported Sitting balance-Leahy Scale: Zero Sitting balance - Comments: mod-total A to correct posterior bias         ADL either performed or assessed with clinical judgement   ADL Overall ADL's : Needs assistance/impaired               General ADL Comments: total A for all aspects at bed level     Vision   Vision Assessment?: Vision impaired- to be further tested in functional context Additional Comments: Pt held R eye closed the entire session     Perception Perception: Not tested       Praxis Praxis: Not tested       Pertinent Vitals/Pain Pain Assessment Pain Assessment: Faces Faces Pain Scale: Hurts little more Pain Location: R hip with WBing Pain Descriptors / Indicators: Discomfort Pain Intervention(s): Monitored during session     Extremity/Trunk Assessment Upper Extremity Assessment Upper Extremity Assessment: Generalized weakness;Difficult to assess due to impaired  cognition   Lower Extremity Assessment Lower Extremity Assessment: Defer to PT evaluation   Cervical / Trunk Assessment Cervical /  Trunk Assessment: Kyphotic   Communication Communication Communication: Hearing impairment   Cognition Arousal: Alert Behavior During Therapy: Flat affect Overall Cognitive Status: No family/caregiver present to determine baseline cognitive functioning       General Comments: oriented to self only. followed very few functional commands. pleasantly confused throughout     General Comments  VSS on 3L            Home Living Family/patient expects to be discharged to:: Skilled nursing facility         Additional Comments: [facility] since aug 2024      Prior Functioning/Environment Prior Level of Function : Needs assist;Patient poor historian/Family not available             Mobility Comments: pt unable to report - anticipate bed bound ADLs Comments: unable to report - anticipate ADLs with staff assist at bed level        OT Problem List: Decreased range of motion;Decreased strength;Decreased activity tolerance;Impaired balance (sitting and/or standing);Decreased cognition;Decreased safety awareness;Decreased knowledge of precautions;Decreased knowledge of use of DME or AE      OT Treatment/Interventions: Self-care/ADL training;Therapeutic exercise;DME and/or AE instruction;Therapeutic activities;Balance training;Patient/family education    OT Goals(Current goals can be found in the care plan section) Acute Rehab OT Goals Patient Stated Goal: to rest OT Goal Formulation: With patient Time For Goal Achievement: 10/19/23 Potential to Achieve Goals: Good ADL Goals Pt Will Perform Grooming: with min assist;sitting Pt Will Perform Upper Body Dressing: with min assist;sitting Pt Will Perform Lower Body Dressing: with max assist;sit to/from stand Pt Will Transfer to Toilet: with max assist;stand pivot transfer  OT Frequency: Min 1X/week    Co-evaluation PT/OT/SLP Co-Evaluation/Treatment: Yes Reason for Co-Treatment: Complexity of the patient's impairments  (multi-system involvement);For patient/therapist safety;To address functional/ADL transfers   OT goals addressed during session: ADL's and self-care      AM-PAC OT "6 Clicks" Daily Activity     Outcome Measure Help from another person eating meals?: Total Help from another person taking care of personal grooming?: Total Help from another person toileting, which includes using toliet, bedpan, or urinal?: Total Help from another person bathing (including washing, rinsing, drying)?: Total Help from another person to put on and taking off regular upper body clothing?: Total Help from another person to put on and taking off regular lower body clothing?: Total 6 Click Score: 6   End of Session Equipment Utilized During Treatment: Gait belt;Oxygen Nurse Communication: Mobility status  Activity Tolerance: Patient tolerated treatment well Patient left: in bed;with call bell/phone within reach;with bed alarm set  OT Visit Diagnosis: Unsteadiness on feet (R26.81);Other abnormalities of gait and mobility (R26.89);Muscle weakness (generalized) (M62.81)                Time: 0865-7846 OT Time Calculation (min): 18 min Charges:  OT General Charges $OT Visit: 1 Visit OT Evaluation $OT Eval Moderate Complexity: 1 Mod  Derenda Mis, OTR/L Acute Rehabilitation Services Office [phone] Secure Chat Communication Preferred   Donia Pounds 10/05/2023, 1:39 PM

## 2023-10-05 NOTE — Plan of Care (Signed)
  Problem: Education: Goal: Ability to describe self-care measures that may prevent or decrease complications (Diabetes Survival Skills Education) will improve Outcome: Progressing Goal: Individualized Educational Video(s) Outcome: Progressing   

## 2023-10-06 DIAGNOSIS — G9341 Metabolic encephalopathy: Secondary | ICD-10-CM | POA: Diagnosis not present

## 2023-10-06 LAB — GLUCOSE, CAPILLARY
Glucose-Capillary: 90 mg/dL (ref 70–99)
Glucose-Capillary: 97 mg/dL (ref 70–99)
Glucose-Capillary: 112 mg/dL — ABNORMAL HIGH (ref 70–99)
Glucose-Capillary: 121 mg/dL — ABNORMAL HIGH (ref 70–99)
Glucose-Capillary: 94 mg/dL (ref 70–99)

## 2023-10-06 MED ORDER — SODIUM CHLORIDE 0.9 % IV SOLN: INTRAVENOUS | Status: AC

## 2023-10-06 MED ORDER — LEVETIRACETAM 500 MG PO TABS
500.0000 mg | ORAL_TABLET | Freq: Two times a day (BID) | ORAL | Status: DC
  Administered 2023-10-06 – 2023-10-12 (×12): 500 mg via ORAL
  Filled 2023-10-06 (×12): qty 1

## 2023-10-06 MED ORDER — MIRTAZAPINE 15 MG PO TABS: 7.5000 mg | ORAL_TABLET | Freq: Every day | ORAL | Status: DC

## 2023-10-06 MED ORDER — THIAMINE MONONITRATE 100 MG PO TABS
100.0000 mg | ORAL_TABLET | Freq: Every day | ORAL | Status: DC
  Administered 2023-10-06 – 2023-10-13 (×8): 100 mg via ORAL
  Filled 2023-10-06 (×8): qty 1

## 2023-10-06 NOTE — Progress Notes (Addendum)
PROGRESS NOTE    Chloe BENTE  Wilcox:[id number] DOB: 08/15/1935 DOA: 10/02/2023 PCP: Noberto Retort, MD   Brief Narrative: Chloe Wilcox is a 88 y.o. female with a history of likely COPD, chronic respiratory failure on 3-4 L/min, depression, hypertension, hyperlipidemia, iron deficiency anemia, right hip fracture.  Patient presented secondary to altered mentation in addition to seizure active with mouth foaming. Patient was admitted to ICU for a diagnosis of hypertensive emergency with concern for possible seizure activity as well. Patient required short intubation and Cleviprex. Mentation improving slowly. Seizure workup revealed no evidence of seizure..   Assessment and Plan:  Hypertensive emergency Associated encephalopathy and demand ischemia. Patient admitted to ICU on Cleviprex drip which was quickly weaned off. Blood pressure currently managed without scheduled antihypertensives.  Hypertensive encephalopathy Initial concern. Patient managed shortly with Celviprex drip. Status epilepticus unlikely with results of EEG testing.  Acute respiratory failure with hypoxia Patient was intubated on admission, 11/4 and extubated on 11/5. Patient stable on room air. Resolved.  Fever Tmax of 100.4 F. No leukocytosis. Asymptomatic, per patient. Afebrile over the past 24 hours. -Trend fever curve  Demand ischemia Elevated troponin without chest pain. In setting of hypertensive emergency. EKG without acute ischemic changes.  Hypokalemia -Potassium supplementation  COPD -Continue Brovana, Pulmicort and Yupelri  Dysphagia Speech therapy recommendations (11/6): Dysphagia 1 (Puree);Thin liquid   Liquid Administration via: Cup;Straw Medication Administration: Crushed with puree Supervision: Staff to assist with self feeding;Full supervision/cueing for compensatory strategies Compensations: Minimize environmental distractions;Slow rate;Small sips/bites Postural Changes: Seated  upright at 90 degrees;Remain upright for at least 30 minutes after po intake   Severe protein malnutrition Dietitian consulted. -Dietitian recommendations: Encourage PO intake Ensure Enlive po TID, each supplement provides 350 kcal and 20 grams of protein.  Chronic anemia Stable. No evidence of hemorrhaging.  History of bilateral cerebellar infarcts Noted. -Continue aspirin  Depression -Continue Celexa  Pressure injury Coccyx, present on admission.   DVT prophylaxis: Heparin subq Code Status:   Code Status: Full Code Family Communication: None at bedside Disposition Plan: Discharge to SNF pending stable fever curve, stable mental status   Consultants:  PCCM Neurology  Procedures:  Intubation/extubation EEG  Antimicrobials: None    Subjective: Patient reports no issues this morning. No concerns from overnight.  Objective: BP 129/71 (BP Location: Right Arm)   Pulse 81   Temp 98 F (36.7 C)   Resp 16   Ht 5\' 2"  (1.575 m)   Wt 49.2 kg   SpO2 99%   BMI 19.84 kg/m   Examination:  General exam: Appears calm and comfortable Respiratory system: Clear to auscultation. Respiratory effort normal. Cardiovascular system: S1 & S2 heard, RRR. Gastrointestinal system: Abdomen is nondistended, soft and nontender. No organomegaly or masses felt. Normal bowel sounds heard. Central nervous system: Alert and oriented to self. Aware of friends/family around her. Difficulty hearing. Musculoskeletal: No edema. No calf tenderness   Data Reviewed: I have personally reviewed following labs and imaging studies  CBC Lab Results  Component Value Date   WBC 8.4 10/05/2023   RBC 3.37 (L) 10/05/2023   HGB 9.5 (L) 10/05/2023   HCT 30.9 (L) 10/05/2023   MCV 91.7 10/05/2023   MCH 28.2 10/05/2023   PLT 189 10/05/2023   MCHC 30.7 10/05/2023   RDW 13.7 10/05/2023   LYMPHSABS 0.6 (L) 07/10/2023   MONOABS 0.6 07/10/2023   EOSABS 0.1 07/10/2023   BASOSABS 0.0 07/10/2023      Last metabolic panel Lab Results  Component Value Date   NA 142 10/05/2023   K 3.2 (L) 10/05/2023   CL 101 10/05/2023   CO2 29 10/05/2023   BUN 8 10/05/2023   CREATININE 0.40 (L) 10/05/2023   GLUCOSE 107 (H) 10/05/2023   GFRNONAA >60 10/05/2023   GFRAA >60 07/25/2020   CALCIUM 9.7 10/05/2023   PHOS 3.4 10/03/2023   PROT 6.1 (L) 07/10/2023   ALBUMIN 3.9 07/10/2023   BILITOT 0.7 07/10/2023   ALKPHOS 64 07/10/2023   AST 17 07/10/2023   ALT 16 07/10/2023   ANIONGAP 12 10/05/2023    GFR: Estimated Creatinine Clearance: 39.2 mL/min (A) (by C-G formula based on SCr of 0.4 mg/dL (L)).  Recent Results (from the past 240 hour(s))  Culture, Respiratory w Gram Stain (tracheal aspirate)     Status: None   Collection Time: 10/02/23 11:34 PM   Specimen: Tracheal Aspirate; Respiratory  Result Value Ref Range Status   Specimen Description TRACHEAL ASPIRATE  Final   Special Requests NONE  Final   Gram Stain   Final    FEW WBC PRESENT, PREDOMINANTLY PMN FEW GRAM POSITIVE COCCI    Culture   Final    Normal respiratory flora-no Staph aureus or Pseudomonas seen Performed at [facility], [address], [place], [place] [address]    Report Status 10/05/2023 FINAL  Final  MRSA Next Gen by PCR, Nasal     Status: None   Collection Time: 10/02/23 11:34 PM   Specimen: Nasal Mucosa; Nasal Swab  Result Value Ref Range Status   MRSA by PCR Next Gen NOT DETECTED NOT DETECTED Final    Comment: (NOTE) The GeneXpert MRSA Assay (FDA approved for NASAL specimens only), is one component of a comprehensive MRSA colonization surveillance program. It is not intended to diagnose MRSA infection nor to guide or monitor treatment for MRSA infections. Test performance is not FDA approved in patients less than 74 years old. Performed at [facility], [address], [place], [place] [address]       Radiology Studies: No results found.    LOS: 4 days    Jacquelin Hawking, MD [facility] 10/06/2023, 3:35 PM   If 7PM-7AM, please contact night-coverage [email or web address]

## 2023-10-06 NOTE — Progress Notes (Addendum)
Speech Language Pathology Treatment: Dysphagia  Patient Details Name: Chloe Wilcox MRN: [id number] DOB: 1935-05-21 Today's Date: 10/06/2023 Time: 4098-1191 SLP Time Calculation (min) (ACUTE ONLY): 8 min  Assessment / Plan / Recommendation Clinical Impression  Pt seen for limited session targeting swallowing goals. Pt had bites of pureed solids and straw sips of thin liquids with no overt s/s of dysphagia or aspiration. Based on conversation with pt's daughter previous date, pt did eat regular textures PTA with careful consideration of most appropriate textures. For example, they report pt is able to eat soft sandwiches cut into pieces and softer meats such as fish in addition to cooked vegetables and mashed potatoes. Suspect pt's dysphagia is near baseline due to her edentulism and cognition but SLP will f/u at least once more to assess ability to upgrade diet during this admission.   HPI HPI: Chloe Wilcox is an 88 yo female presenting to ED 11/4 with AMS after seizure activity. Intubated 11/4-11/5. MRI Brain negative. EEG with severe diffuse encephalopathy. Per MD, concern for esophageal stricture as NGT has been unable to pass. PMH includes dementia, HTN      SLP Plan  Continue with current plan of care      Recommendations for follow up therapy are one component of a multi-disciplinary discharge planning process, led by the attending physician.  Recommendations may be updated based on patient status, additional functional criteria and insurance authorization.    Recommendations  Diet recommendations: Thin liquid;Dysphagia 1 (puree) Liquids provided via: Straw;Cup Medication Administration: Crushed with puree Supervision: Staff to assist with self feeding;Full supervision/cueing for compensatory strategies;Trained caregiver to feed patient Compensations: Minimize environmental distractions;Slow rate;Small sips/bites Postural Changes and/or Swallow Maneuvers: Seated upright 90 degrees                   Oral care BID;Staff/trained caregiver to provide oral care   Frequent or constant Supervision/Assistance Dysphagia, unspecified (R13.10)     Continue with current plan of care     Gwynneth Aliment, M.A., CF-SLP Speech Language Pathology, Acute Rehabilitation Services  Secure Chat preferred [phone]   10/06/2023, 2:40 PM

## 2023-10-06 NOTE — Plan of Care (Signed)
  Problem: Health Behavior/Discharge Planning: Goal: Ability to manage health-related needs will improve Outcome: Progressing   Problem: Metabolic: Goal: Ability to maintain appropriate glucose levels will improve Outcome: Progressing   Problem: Skin Integrity: Goal: Risk for impaired skin integrity will decrease Outcome: Progressing   Problem: Clinical Measurements: Goal: Respiratory complications will improve Outcome: Progressing Goal: Cardiovascular complication will be avoided Outcome: Progressing   Problem: Elimination: Goal: Will not experience complications related to urinary retention Outcome: Progressing

## 2023-10-06 NOTE — TOC Progression Note (Signed)
Transition of Care [facility]) - Progression Note    Patient Details  Name: Chloe Wilcox MRN: [id number] Date of Birth: 1935-01-29  [facility] CM/SW Contact  Baldemar Lenis, [place] Phone Number: 10/06/2023, 1:16 PM  Clinical Narrative:   CSW spoke with granddaughter, [name], again about possible disposition. [name] still has concerns about what has happened to her grandmother and would really like to discuss with the doctor before the family makes any decisions regarding a possible disposition when ready for discharge. Family is unsure if [employer] can handle the patient's care needs if she does not improve from her current state, but would like to understand more about what is occurring before making discharge plans. CSW updated MD and requested him to reach out to family to discuss. CSW to follow.    Expected Discharge Plan: Skilled Nursing Facility Barriers to Discharge: Continued Medical Work up, English as a second language teacher  Expected Discharge Plan and Services In-house Referral: Clinical Social Work   Post Acute Care Choice: Skilled Nursing Facility Living arrangements for the past 2 months: Skilled Nursing Facility                                       Social Determinants of Health (SDOH) Interventions SDOH Screenings   Food Insecurity: No Food Insecurity (10/05/2023)  Housing: Low Risk  (10/05/2023)  Transportation Needs: No Transportation Needs (10/05/2023)  Utilities: Not At Risk (10/05/2023)  Tobacco Use: Medium Risk (10/05/2023)    Readmission Risk Interventions     No data to display

## 2023-10-06 NOTE — Plan of Care (Signed)

## 2023-10-07 DIAGNOSIS — G9341 Metabolic encephalopathy: Secondary | ICD-10-CM | POA: Diagnosis not present

## 2023-10-07 LAB — URINALYSIS, ROUTINE W REFLEX MICROSCOPIC
Bilirubin Urine: NEGATIVE
Glucose, UA: NEGATIVE mg/dL
Ketones, ur: NEGATIVE mg/dL
Nitrite: POSITIVE — AB
Protein, ur: NEGATIVE mg/dL
Specific Gravity, Urine: 1.02 (ref 1.005–1.030)
WBC, UA: >50 WBC/hpf (ref 0–5)
pH: 5 (ref 5.0–8.0)

## 2023-10-07 LAB — GLUCOSE, CAPILLARY
Glucose-Capillary: 100 mg/dL — ABNORMAL HIGH (ref 70–99)
Glucose-Capillary: 105 mg/dL — ABNORMAL HIGH (ref 70–99)
Glucose-Capillary: 121 mg/dL — ABNORMAL HIGH (ref 70–99)
Glucose-Capillary: 127 mg/dL — ABNORMAL HIGH (ref 70–99)
Glucose-Capillary: 129 mg/dL — ABNORMAL HIGH (ref 70–99)

## 2023-10-07 LAB — BASIC METABOLIC PANEL
Anion gap: 7 (ref 5–15)
BUN: 14 mg/dL (ref 8–23)
CO2: 31 mmol/L (ref 22–32)
Calcium: 9.8 mg/dL (ref 8.9–10.3)
Chloride: 103 mmol/L (ref 98–111)
Creatinine, Ser: 0.42 mg/dL — ABNORMAL LOW (ref 0.44–1.00)
GFR, Estimated: >60 mL/min (ref >60)
Glucose, Bld: 92 mg/dL (ref 70–99)
Potassium: 3.3 mmol/L — ABNORMAL LOW (ref 3.5–5.1)
Sodium: 141 mmol/L (ref 135–145)

## 2023-10-07 MED ORDER — POTASSIUM CHLORIDE CRYS ER 20 MEQ PO TBCR
40.0000 meq | EXTENDED_RELEASE_TABLET | Freq: Once | ORAL | Status: AC
  Administered 2023-10-07: 40 meq via ORAL
  Filled 2023-10-07: qty 2

## 2023-10-07 MED ORDER — ORAL CARE MOUTH RINSE
15.0000 mL | OROMUCOSAL | Status: DC
  Administered 2023-10-07 – 2023-10-13 (×21): 15 mL via OROMUCOSAL

## 2023-10-07 MED ORDER — ORAL CARE MOUTH RINSE: 15.0000 mL | OROMUCOSAL | Status: DC | PRN

## 2023-10-07 NOTE — Progress Notes (Signed)
PROGRESS NOTE    Chloe Wilcox  HQI:[id number] DOB: [birth year] DOA: 10/02/2023 PCP: Noberto Retort, MD   Brief Narrative: Chloe Wilcox is a 88 y.o. female with a history of likely COPD, chronic respiratory failure on 3-4 L/min, depression, hypertension, hyperlipidemia, iron deficiency anemia, right hip fracture.  Patient presented secondary to altered mentation in addition to seizure active with mouth foaming. Patient was admitted to ICU for a diagnosis of hypertensive emergency with concern for possible seizure activity as well. Patient required short intubation and Cleviprex. Mentation improving slowly. Seizure workup revealed no evidence of seizure..   Assessment and Plan:  Hypertensive emergency Associated encephalopathy and demand ischemia. Patient admitted to ICU on Cleviprex drip which was quickly weaned off. Blood pressure currently managed without scheduled antihypertensives.  Hypertensive encephalopathy Initial concern. Patient managed shortly with Celviprex drip. Status epilepticus unlikely with results of EEG testing.  Acute respiratory failure with hypoxia Patient was intubated on admission, 11/4 and extubated on 11/5. Patient stable on room air. Resolved.  Fever Tmax of 98.7 F. No leukocytosis. Asymptomatic, per patient. Afebrile over the past 24 hours. -Trend fever curve  Dark urine Concern for possible dehydration. Possibly related to high bilirubin content. No improvement with IV fluids overnight. -Check urinalysis  Demand ischemia Elevated troponin without chest pain. In setting of hypertensive emergency. EKG without acute ischemic changes.  Hypokalemia -Potassium supplementation  COPD -Continue Brovana, Pulmicort and Yupelri  Dysphagia Speech therapy recommendations (11/8): Diet recommendations: Thin liquid;Dysphagia 1 (puree) Liquids provided via: Straw;Cup Medication Administration: Crushed with puree Supervision: Staff to assist with self  feeding;Full supervision/cueing for compensatory strategies;Trained caregiver to feed patient Compensations: Minimize environmental distractions;Slow rate;Small sips/bites Postural Changes and/or Swallow Maneuvers: Seated upright 90 degrees  Severe protein malnutrition Dietitian consulted. -Dietitian recommendations: Encourage PO intake Ensure Enlive po TID, each supplement provides 350 kcal and 20 grams of protein.  Chronic anemia Stable. No evidence of hemorrhaging.  History of bilateral cerebellar infarcts Noted. -Continue aspirin  Depression -Continue Celexa  Pressure injury Coccyx, present on admission.   DVT prophylaxis: Heparin subq Code Status:   Code Status: Full Code Family Communication: None at bedside. Granddaughter at bedside Disposition Plan: Discharge to SNF pending stable fever curve, stable mental status   Consultants:  PCCM Neurology  Procedures:  Intubation/extubation EEG  Antimicrobials: None    Subjective: No issues this morning, per patient, but she is confused.  Objective: BP (!) 154/92 (BP Location: Right Arm)   Pulse 71   Temp 98.7 F (37.1 C) (Oral)   Resp 18   Ht 5\' 2"  (1.575 m)   Wt 49.2 kg   SpO2 99%   BMI 19.84 kg/m   Examination:  General exam: Appears calm and comfortable Respiratory system: Clear to auscultation. Respiratory effort normal. Cardiovascular system: S1 & S2 heard, Normal rate with regular rhythm. Gastrointestinal system: Abdomen is nondistended, soft and nontender.  Normal bowel sounds heard. Central nervous system: Alert and oriented to self. Musculoskeletal: No edema. No calf tenderness Psychiatry: Judgement and insight impaired.  Data Reviewed: I have personally reviewed following labs and imaging studies  CBC Lab Results  Component Value Date   WBC 8.4 10/05/2023   RBC 3.37 (L) 10/05/2023   HGB 9.5 (L) 10/05/2023   HCT 30.9 (L) 10/05/2023   MCV 91.7 10/05/2023   MCH 28.2 10/05/2023   PLT  189 10/05/2023   MCHC 30.7 10/05/2023   RDW 13.7 10/05/2023   LYMPHSABS 0.6 (L) 07/10/2023   MONOABS 0.6 07/10/2023  EOSABS 0.1 07/10/2023   BASOSABS 0.0 07/10/2023     Last metabolic panel Lab Results  Component Value Date   NA 141 10/07/2023   K 3.3 (L) 10/07/2023   CL 103 10/07/2023   CO2 31 10/07/2023   BUN 14 10/07/2023   CREATININE 0.42 (L) 10/07/2023   GLUCOSE 92 10/07/2023   GFRNONAA >60 10/07/2023   GFRAA >60 07/25/2020   CALCIUM 9.8 10/07/2023   PHOS 3.4 10/03/2023   PROT 6.1 (L) 07/10/2023   ALBUMIN 3.9 07/10/2023   BILITOT 0.7 07/10/2023   ALKPHOS 64 07/10/2023   AST 17 07/10/2023   ALT 16 07/10/2023   ANIONGAP 7 10/07/2023    GFR: Estimated Creatinine Clearance: 39.2 mL/min (A) (by C-G formula based on SCr of 0.42 mg/dL (L)).  Recent Results (from the past 240 hour(s))  Culture, Respiratory w Gram Stain (tracheal aspirate)     Status: None   Collection Time: 10/02/23 11:34 PM   Specimen: Tracheal Aspirate; Respiratory  Result Value Ref Range Status   Specimen Description TRACHEAL ASPIRATE  Final   Special Requests NONE  Final   Gram Stain   Final    FEW WBC PRESENT, PREDOMINANTLY PMN FEW GRAM POSITIVE COCCI    Culture   Final    Normal respiratory flora-no Staph aureus or Pseudomonas seen Performed at [facility], [address], [place], [place] [address]    Report Status 10/05/2023 FINAL  Final  MRSA Next Gen by PCR, Nasal     Status: None   Collection Time: 10/02/23 11:34 PM   Specimen: Nasal Mucosa; Nasal Swab  Result Value Ref Range Status   MRSA by PCR Next Gen NOT DETECTED NOT DETECTED Final    Comment: (NOTE) The GeneXpert MRSA Assay (FDA approved for NASAL specimens only), is one component of a comprehensive MRSA colonization surveillance program. It is not intended to diagnose MRSA infection nor to guide or monitor treatment for MRSA infections. Test performance is not FDA approved in patients less than 64  years old. Performed at [facility], [address], [place], [place] [address]       Radiology Studies: No results found.    LOS: 5 days    Jacquelin Hawking, MD [facility] 10/07/2023, 10:00 AM   If 7PM-7AM, please contact night-coverage [email or web address]

## 2023-10-07 NOTE — Plan of Care (Signed)
  Problem: Education: Goal: Ability to describe self-care measures that may prevent or decrease complications (Diabetes Survival Skills Education) will improve Outcome: Progressing   Problem: Nutritional: Goal: Progress toward achieving an optimal weight will improve Outcome: Progressing   Problem: Clinical Measurements: Goal: Respiratory complications will improve Outcome: Progressing   Problem: Safety: Goal: Ability to remain free from injury will improve Outcome: Progressing

## 2023-10-07 NOTE — Plan of Care (Signed)

## 2023-10-08 DIAGNOSIS — G9341 Metabolic encephalopathy: Secondary | ICD-10-CM | POA: Diagnosis not present

## 2023-10-08 LAB — GLUCOSE, CAPILLARY
Glucose-Capillary: 73 mg/dL (ref 70–99)
Glucose-Capillary: 120 mg/dL — ABNORMAL HIGH (ref 70–99)
Glucose-Capillary: 105 mg/dL — ABNORMAL HIGH (ref 70–99)
Glucose-Capillary: 100 mg/dL — ABNORMAL HIGH (ref 70–99)

## 2023-10-08 MED ORDER — SODIUM CHLORIDE 0.9 % IV SOLN
1.0000 g | INTRAVENOUS | Status: AC
  Administered 2023-10-08 – 2023-10-10 (×3): 1 g via INTRAVENOUS
  Filled 2023-10-08 (×3): qty 10

## 2023-10-08 MED ORDER — ACETAMINOPHEN 325 MG PO TABS
650.0000 mg | ORAL_TABLET | Freq: Four times a day (QID) | ORAL | Status: DC | PRN
  Administered 2023-10-09 – 2023-10-11 (×3): 650 mg via ORAL
  Filled 2023-10-08 (×3): qty 2

## 2023-10-08 NOTE — Progress Notes (Signed)
Patient's midnight vitals not taken to allow for rest, patient has been agitated until sleeping this evening.  VSS, will cont to monitor.

## 2023-10-08 NOTE — Progress Notes (Signed)
PROGRESS NOTE    Chloe Wilcox  UEA:[id number] DOB: 1935/08/23 DOA: 10/02/2023 PCP: Chloe Retort, MD   Brief Narrative: Chloe Wilcox is a 88 y.o. female with a history of likely COPD, chronic respiratory failure on 3-4 L/min, depression, hypertension, hyperlipidemia, iron deficiency anemia, right hip fracture.  Patient presented secondary to altered mentation in addition to seizure active with mouth foaming. Patient was admitted to ICU for a diagnosis of hypertensive emergency with concern for possible seizure activity as well. Patient required short intubation and Cleviprex. Mentation improving slowly. Seizure workup revealed no evidence of seizure..   Assessment and Plan:  Hypertensive emergency Associated encephalopathy and demand ischemia. Patient admitted to ICU on Cleviprex drip which was quickly weaned off. Blood pressure currently managed without scheduled antihypertensives.  Hypertensive encephalopathy Initial concern. Patient managed shortly with Celviprex drip. Status epilepticus unlikely with results of EEG testing.  Acute respiratory failure with hypoxia Patient was intubated on admission, 11/4 and extubated on 11/5. Patient stable on room air. Resolved.  Fever Tmax of 98.7 F. No leukocytosis. Asymptomatic, per patient. Afebrile over the past 24 hours. Resolved,  UTI Concern for possible dehydration. Possibly related to high bilirubin content. No improvement with IV fluids overnight. Urinalysis suggests bacterial infection. With associated confusion and fever, will treat empirically -Urine culture -Ceftriaxone  g IV x3 days  Demand ischemia Elevated troponin without chest pain. In setting of hypertensive emergency. EKG without acute ischemic changes.  Hypokalemia -Potassium supplementation  COPD -Continue Brovana, Pulmicort and Yupelri  Dysphagia Speech therapy recommendations (11/8): Diet recommendations: Thin liquid;Dysphagia 1 (puree) Liquids  provided via: Straw;Cup Medication Administration: Crushed with puree Supervision: Staff to assist with self feeding;Full supervision/cueing for compensatory strategies;Trained caregiver to feed patient Compensations: Minimize environmental distractions;Slow rate;Small sips/bites Postural Changes and/or Swallow Maneuvers: Seated upright 90 degrees  Severe protein malnutrition Dietitian consulted. -Dietitian recommendations: Encourage PO intake Ensure Enlive po TID, each supplement provides 350 kcal and 20 grams of protein.  Chronic anemia Stable. No evidence of hemorrhaging.  History of bilateral cerebellar infarcts Noted. -Continue aspirin  Depression -Continue Celexa  Pressure injury Coccyx, present on admission.   DVT prophylaxis: Heparin subq Code Status:   Code Status: Full Code Family Communication: None at bedside. Granddaughter on telephone Disposition Plan: Discharge to SNF pending stable fever curve, stable mental status. Anticipate medical readiness today.   Consultants:  PCCM Neurology  Procedures:  Intubation/extubation EEG  Antimicrobials: Ceftriaxone IV    Subjective: No concerns today per patient.  Objective: BP (!) 150/71 (BP Location: Right Arm)   Pulse 78   Temp 98.2 F (36.8 C) (Oral)   Resp 18   Ht 5\' 2"  (1.575 m)   Wt 49.2 kg   SpO2 98%   BMI 19.84 kg/m   Examination:  General exam: Appears calm and comfortable Respiratory system: Clear to auscultation. Respiratory effort normal. Cardiovascular system: S1 & S2 heard, RRR. No murmurs. Gastrointestinal system: Abdomen is nondistended, soft and nontender. Normal bowel sounds heard. Central nervous system: Alert and oriented. No focal neurological deficits. Psychiatry: Judgement and insight appear normal. Full affect    Data Reviewed: I have personally reviewed following labs and imaging studies  CBC Lab Results  Component Value Date   WBC 8.4 10/05/2023   RBC 3.37 (L)  10/05/2023   HGB 9.5 (L) 10/05/2023   HCT 30.9 (L) 10/05/2023   MCV 91.7 10/05/2023   MCH 28.2 10/05/2023   PLT 189 10/05/2023   MCHC 30.7 10/05/2023   RDW  13.7 10/05/2023   LYMPHSABS 0.6 (L) 07/10/2023   MONOABS 0.6 07/10/2023   EOSABS 0.1 07/10/2023   BASOSABS 0.0 07/10/2023     Last metabolic panel Lab Results  Component Value Date   NA 141 10/07/2023   K 3.3 (L) 10/07/2023   CL 103 10/07/2023   CO2 31 10/07/2023   BUN 14 10/07/2023   CREATININE 0.42 (L) 10/07/2023   GLUCOSE 92 10/07/2023   GFRNONAA >60 10/07/2023   GFRAA >60 07/25/2020   CALCIUM 9.8 10/07/2023   PHOS 3.4 10/03/2023   PROT 6.1 (L) 07/10/2023   ALBUMIN 3.9 07/10/2023   BILITOT 0.7 07/10/2023   ALKPHOS 64 07/10/2023   AST 17 07/10/2023   ALT 16 07/10/2023   ANIONGAP 7 10/07/2023    GFR: Estimated Creatinine Clearance: 39.2 mL/min (A) (by C-G formula based on SCr of 0.42 mg/dL (L)).  Recent Results (from the past 240 hour(s))  Culture, Respiratory w Gram Stain (tracheal aspirate)     Status: None   Collection Time: 10/02/23 11:34 PM   Specimen: Tracheal Aspirate; Respiratory  Result Value Ref Range Status   Specimen Description TRACHEAL ASPIRATE  Final   Special Requests NONE  Final   Gram Stain   Final    FEW WBC PRESENT, PREDOMINANTLY PMN FEW GRAM POSITIVE COCCI    Culture   Final    Normal respiratory flora-no Staph aureus or Pseudomonas seen Performed at [facility] [place] [facility], [address], [place], [place] [address]    Report Status 10/05/2023 FINAL  Final  MRSA Next Gen by PCR, Nasal     Status: None   Collection Time: 10/02/23 11:34 PM   Specimen: Nasal Mucosa; Nasal Swab  Result Value Ref Range Status   MRSA by PCR Next Gen NOT DETECTED NOT DETECTED Final    Comment: (NOTE) The GeneXpert MRSA Assay (FDA approved for NASAL specimens only), is one component of a comprehensive MRSA colonization surveillance program. It is not intended to diagnose MRSA infection nor to guide or  monitor treatment for MRSA infections. Test performance is not FDA approved in patients less than 65 years old. Performed at [facility], [address] [place], [place] [address]       Radiology Studies: No results found.    LOS: 6 days    Jacquelin Hawking, MD [facility] 10/08/2023, 11:27 AM   If 7PM-7AM, please contact night-coverage [email or web address]

## 2023-10-08 NOTE — Plan of Care (Signed)
  Problem: Metabolic: Goal: Ability to maintain appropriate glucose levels will improve Outcome: Progressing   Problem: Nutritional: Goal: Maintenance of adequate nutrition will improve Outcome: Progressing   Problem: Skin Integrity: Goal: Risk for impaired skin integrity will decrease Outcome: Progressing   Problem: Clinical Measurements: Goal: Respiratory complications will improve Outcome: Progressing Goal: Cardiovascular complication will be avoided Outcome: Progressing

## 2023-10-09 ENCOUNTER — Inpatient Hospital Stay (HOSPITAL_COMMUNITY): Payer: Medicare Other

## 2023-10-09 DIAGNOSIS — G9341 Metabolic encephalopathy: Secondary | ICD-10-CM | POA: Diagnosis not present

## 2023-10-09 LAB — GLUCOSE, CAPILLARY
Glucose-Capillary: 80 mg/dL (ref 70–99)
Glucose-Capillary: 114 mg/dL — ABNORMAL HIGH (ref 70–99)
Glucose-Capillary: 84 mg/dL (ref 70–99)
Glucose-Capillary: 82 mg/dL (ref 70–99)

## 2023-10-09 LAB — POTASSIUM: Potassium: 4.1 mmol/L (ref 3.5–5.1)

## 2023-10-09 MED ORDER — IRBESARTAN 150 MG PO TABS
150.0000 mg | ORAL_TABLET | Freq: Every day | ORAL | Status: DC
  Administered 2023-10-09 – 2023-10-13 (×5): 150 mg via ORAL
  Filled 2023-10-09 (×5): qty 1

## 2023-10-09 NOTE — Progress Notes (Signed)
Physical Therapy Treatment Patient Details Name: Chloe Wilcox MRN: [id number] DOB: February 10, 1935 Today's Date: 10/09/2023   History of Present Illness Chloe Wilcox is a 88 y.o. female who presented with altered mentation in addition to seizure activity. Intubated 11/4-11/5. PMHx: COPD, chronic respiratory failure on 3-4 L/min, depression, hypertension, hyperlipidemia, iron deficiency anemia, right hip fracture.    PT Comments  Pt greeted resting in bed and agreeable to session with steady progress towards acute goals. Pt able to follow all simple single step commands throughout session and requiring less assist with all mobility. Pt requiring mod A to come to sitting up EOB with pt demonstrating posterior lean due to increased hip and back pain with increased hip flexion, however pt able to maintain without assist. Pt able to come to partial stand x2 with max A, clearing hips from EOB but unable to achieve full upright standing with pt calling out in pain with attempts. Pt able to perform seated LE therex with cues for technique. Pt needing max A to return to supine at end of session due to pain and bed positioned in partial chair position with pt tolerating well. Current plan remains appropriate to address deficits and maximize functional independence and decrease caregiver burden. Pt continues to benefit from skilled PT services to progress toward functional mobility goals.     If plan is discharge home, recommend the following:     Can travel by private vehicle     No  Equipment Recommendations  None recommended by PT    Recommendations for Other Services       Precautions / Restrictions Precautions Precautions: Fall Restrictions Weight Bearing Restrictions: No     Mobility  Bed Mobility Overal bed mobility: Needs Assistance Bed Mobility: Supine to Sit, Sit to Supine     Supine to sit: Mod assist, HOB elevated Sit to supine: Max assist   General bed mobility comments: mod A  to elevate trunk and manage LEs to EOB, max A to return to supine due to pain    Transfers Overall transfer level: Needs assistance Equipment used: Rolling walker (2 wheels) Transfers: Sit to/from Stand Sit to Stand: Max assist           General transfer comment: unable to stand fully upright, pt with posterior lean due to hip/back pain, attempted x2    Ambulation/Gait                   Stairs             Wheelchair Mobility     Tilt Bed    Modified Rankin (Stroke Patients Only)       Balance Overall balance assessment: Needs assistance Sitting-balance support: Feet supported, Bilateral upper extremity supported Sitting balance-Leahy Scale: Poor Sitting balance - Comments: posterior lean due to pain, but able to maintain with BUE support without assist, increased time spent coming to upright sitting with hips flexed to 90 degrees                                    Cognition Arousal: Alert Behavior During Therapy: Flat affect Overall Cognitive Status: History of cognitive impairments - at baseline Area of Impairment: Orientation, Memory, Safety/judgement                 Orientation Level: Disoriented to, Time   Memory: Decreased short-term memory   Safety/Judgement: Decreased awareness of safety, Decreased  awareness of deficits     General Comments: oriented to self only and place, able to follow all simple single step commands, pt god daughter at bedside and reporting pt sometimes orientented to year        Exercises General Exercises - Lower Extremity Long Arc Quad: AROM, Right, Left, 5 reps, Seated    General Comments General comments (skin integrity, edema, etc.): VSS on 3L, pt goddaughter present at bedside and reporting pt was walking hallway distances with RW at SNF PTA      Pertinent Vitals/Pain Pain Assessment Pain Assessment: Faces Faces Pain Scale: Hurts even more Pain Location: back Pain Descriptors /  Indicators: Grimacing, Guarding, Moaning Pain Intervention(s): Monitored during session, Limited activity within patient's tolerance, Repositioned    Home Living                          Prior Function            PT Goals (current goals can now be found in the care plan section) Acute Rehab PT Goals PT Goal Formulation: Patient unable to participate in goal setting Time For Goal Achievement: 10/19/23 Potential to Achieve Goals: Poor Progress towards PT goals: Progressing toward goals    Frequency    Min 1X/week      PT Plan      Co-evaluation              AM-PAC PT "6 Clicks" Mobility   Outcome Measure  Help needed turning from your back to your side while in a flat bed without using bedrails?: A Lot Help needed moving from lying on your back to sitting on the side of a flat bed without using bedrails?: A Lot Help needed moving to and from a bed to a chair (including a wheelchair)?: Total Help needed standing up from a chair using your arms (e.g., wheelchair or bedside chair)?: Total Help needed to walk in hospital room?: Total Help needed climbing 3-5 steps with a railing? : Total 6 Click Score: 8    End of Session   Activity Tolerance: Patient limited by pain Patient left: in bed;with call bell/phone within reach;with family/visitor present (with bed in chair position) Nurse Communication: Mobility status;Other (comment) (need to be cleaned up from [facility]) PT Visit Diagnosis: Unsteadiness on feet (R26.81);Muscle weakness (generalized) (M62.81);Difficulty in walking, not elsewhere classified (R26.2);Pain Pain - part of body: Knee     Time: 1610-9604 PT Time Calculation (min) (ACUTE ONLY): 21 min  Charges:    $Therapeutic Activity: 8-22 mins PT General Charges $$ ACUTE PT VISIT: 1 Visit                     Alegandro Macnaughton R. PTA Acute Rehabilitation Services Office: [phone]   Catalina Antigua 10/09/2023, 11:37 AM

## 2023-10-09 NOTE — Progress Notes (Signed)
Speech Language Pathology Treatment: Dysphagia  Patient Details Name: Chloe Wilcox MRN: [id number] DOB: [birth year] Today's Date: 10/09/2023 Time: 8416-6063 SLP Time Calculation (min) (ACUTE ONLY): 12 min  Assessment / Plan / Recommendation Clinical Impression  Pt presents with significantly improved mentation this date. She is able to thoroughly masticate bite-sized solids. Observed pt with consecutive straw sips of thin liquids with no signs clinically concerning for aspiration. She consistently belched following each swallow, which her granddaughter reports is similar to baseline and she attributes to "gulping" as she drinks. This may be representative of an esophageal component. Recommend upgrading diet to include Dys 3 texture solids with thin liquids. Provided education regarding diet modification to pt and her granddaughter. Given pt's return to baseline, no further SLP f/u is necessary at this time.    HPI HPI: Chloe Wilcox is an 88 yo female presenting to ED 11/4 with AMS after seizure activity. Intubated 11/4-11/5. MRI Brain negative. EEG with severe diffuse encephalopathy. Per MD, concern for esophageal stricture as NGT has been unable to pass. PMH includes dementia, HTN      SLP Plan  All goals met      Recommendations for follow up therapy are one component of a multi-disciplinary discharge planning process, led by the attending physician.  Recommendations may be updated based on patient status, additional functional criteria and insurance authorization.    Recommendations  Diet recommendations: Dysphagia 3 (mechanical soft);Thin liquid Liquids provided via: Straw;Cup Medication Administration: Crushed with puree Supervision: Staff to assist with self feeding;Full supervision/cueing for compensatory strategies;Trained caregiver to feed patient Compensations: Minimize environmental distractions;Slow rate;Small sips/bites Postural Changes and/or Swallow Maneuvers: Seated  upright 90 degrees                  Oral care BID;Staff/trained caregiver to provide oral care   Frequent or constant Supervision/Assistance Dysphagia, unspecified (R13.10)     All goals met     Gwynneth Aliment, M.A., CF-SLP Speech Language Pathology, Acute Rehabilitation Services  Secure Chat preferred [phone]   10/09/2023, 2:18 PM

## 2023-10-09 NOTE — Plan of Care (Signed)

## 2023-10-09 NOTE — TOC Progression Note (Signed)
Transition of Care [facility]) - Progression Note    Patient Details  Name: Chloe Wilcox MRN: [id number] Date of Birth: 1935-10-02  Transition of Care [facility]) CM/SW Contact  Erin Sons, [place] Phone Number: 10/09/2023, 2:31 PM  Clinical Narrative:     CSW called pt's granddaughter to discuss disposition. Daughter states she will need to discuss pt's care with [facility] before she decides for pt to return there. She also expresses concerns about blood pressure medications and wishes to speak with MD. CSW notified MD.   CSW spoke with [facility] admissions who confirm pt can return. They request PT see her and evaluate if he she needs therapy on return and if so would need to start auth.   PT is recommending therapy on return. CSW initiated SNF auth request; status is pending.   Expected Discharge Plan: Skilled Nursing Facility Barriers to Discharge: Continued Medical Work up, English as a second language teacher  Expected Discharge Plan and Services In-house Referral: Clinical Social Work   Post Acute Care Choice: Skilled Nursing Facility Living arrangements for the past 2 months: Skilled Nursing Facility                                       Social Determinants of Health (SDOH) Interventions SDOH Screenings   Food Insecurity: No Food Insecurity (10/05/2023)  Housing: Low Risk  (10/05/2023)  Transportation Needs: No Transportation Needs (10/05/2023)  Utilities: Not At Risk (10/05/2023)  Tobacco Use: Medium Risk (10/05/2023)    Readmission Risk Interventions     No data to display

## 2023-10-09 NOTE — Progress Notes (Addendum)
PROGRESS NOTE    Chloe Wilcox  OZH:[id number] DOB: 31-Dec-1934 DOA: 10/02/2023 PCP: Noberto Retort, MD   Brief Narrative: Chloe Wilcox is a 88 y.o. female with a history of likely COPD, chronic respiratory failure on 3-4 L/min, depression, hypertension, hyperlipidemia, iron deficiency anemia, right hip fracture.  Patient presented secondary to altered mentation in addition to seizure active with mouth foaming. Patient was admitted to ICU for a diagnosis of hypertensive emergency with concern for possible seizure activity as well. Patient required short intubation and Cleviprex. Mentation improving slowly. Seizure workup revealed no evidence of seizure..   Assessment and Plan:  Hypertensive emergency Associated encephalopathy and demand ischemia. Patient admitted to ICU on Cleviprex drip which was quickly weaned off. -irbesartan (substituted for home olmesartan)  Hypertensive encephalopathy Initial concern. Patient managed shortly with Celviprex drip. Status epilepticus unlikely with results of EEG testing.  Acute on chronic respiratory failure with hypoxia Patient was intubated on admission, 11/4 and extubated on 11/5. -Continue oxygen supplementation  Fever Tmax of 98.7 F. No leukocytosis. Asymptomatic, per patient. Afebrile over the past 24 hours. Resolved,  UTI Concern for possible dehydration. Possibly related to high bilirubin content. No improvement with IV fluids overnight. Urinalysis suggests bacterial infection. With associated confusion and fever, will treat empirically -Urine culture pending -Ceftriaxone  g IV x3 days  Demand ischemia Elevated troponin without chest pain. In setting of hypertensive emergency. EKG without acute ischemic changes.  Hypokalemia -Potassium supplementation  COPD -Continue Brovana, Pulmicort and Yupelri  Dysphagia Speech therapy recommendations (11/8): Diet recommendations: Thin liquid;Dysphagia 1 (puree) Liquids provided via:  Straw;Cup Medication Administration: Crushed with puree Supervision: Staff to assist with self feeding;Full supervision/cueing for compensatory strategies;Trained caregiver to feed patient Compensations: Minimize environmental distractions;Slow rate;Small sips/bites Postural Changes and/or Swallow Maneuvers: Seated upright 90 degrees -Check barium swallow  Severe protein malnutrition Dietitian consulted. -Dietitian recommendations: Encourage PO intake Ensure Enlive po TID, each supplement provides 350 kcal and 20 grams of protein.  Chronic anemia Stable. No evidence of hemorrhaging.  History of bilateral cerebellar infarcts Noted. -Continue aspirin  Depression -Continue Celexa  Pressure injury Coccyx, present on admission.   DVT prophylaxis: Heparin subq Code Status:   Code Status: Full Code Family Communication: None at bedside. Granddaughter on telephone Disposition Plan: Discharge to SNF tomorrow pending barium swallow study   Consultants:  PCCM Neurology  Procedures:  Intubation/extubation EEG  Antimicrobials: Ceftriaxone IV    Subjective: No issues today.  Objective: BP 128/66 (BP Location: Right Arm)   Pulse 69   Temp 97.8 F (36.6 C) (Axillary)   Resp 19   Ht 5\' 2"  (1.575 m)   Wt 49.2 kg   SpO2 97%   BMI 19.84 kg/m   Examination:  General exam: Appears calm and comfortable Respiratory system: Clear to auscultation. Respiratory effort normal. Cardiovascular system: S1 & S2 heard, RRR. No murmurs. Gastrointestinal system: Abdomen is nondistended, soft and nontender. Normal bowel sounds heard. Central nervous system: Alert and oriented. Musculoskeletal: No edema. No calf tenderness Psychiatry: Judgement and insight appear normal. Mood & affect appropriate.    Data Reviewed: I have personally reviewed following labs and imaging studies  CBC Lab Results  Component Value Date   WBC 8.4 10/05/2023   RBC 3.37 (L) 10/05/2023   HGB 9.5 (L)  10/05/2023   HCT 30.9 (L) 10/05/2023   MCV 91.7 10/05/2023   MCH 28.2 10/05/2023   PLT 189 10/05/2023   MCHC 30.7 10/05/2023   RDW 13.7 10/05/2023  LYMPHSABS 0.6 (L) 07/10/2023   MONOABS 0.6 07/10/2023   EOSABS 0.1 07/10/2023   BASOSABS 0.0 07/10/2023     Last metabolic panel Lab Results  Component Value Date   NA 141 10/07/2023   K 4.1 10/09/2023   CL 103 10/07/2023   CO2 31 10/07/2023   BUN 14 10/07/2023   CREATININE 0.42 (L) 10/07/2023   GLUCOSE 92 10/07/2023   GFRNONAA >60 10/07/2023   GFRAA >60 07/25/2020   CALCIUM 9.8 10/07/2023   PHOS 3.4 10/03/2023   PROT 6.1 (L) 07/10/2023   ALBUMIN 3.9 07/10/2023   BILITOT 0.7 07/10/2023   ALKPHOS 64 07/10/2023   AST 17 07/10/2023   ALT 16 07/10/2023   ANIONGAP 7 10/07/2023    GFR: Estimated Creatinine Clearance: 39.2 mL/min (A) (by C-G formula based on SCr of 0.42 mg/dL (L)).  Recent Results (from the past 240 hour(s))  Culture, Respiratory w Gram Stain (tracheal aspirate)     Status: None   Collection Time: 10/02/23 11:34 PM   Specimen: Tracheal Aspirate; Respiratory  Result Value Ref Range Status   Specimen Description TRACHEAL ASPIRATE  Final   Special Requests NONE  Final   Gram Stain   Final    FEW WBC PRESENT, PREDOMINANTLY PMN FEW GRAM POSITIVE COCCI    Culture   Final    Normal respiratory flora-no Staph aureus or Pseudomonas seen Performed at [facility], [address], [place], [place] [address]    Report Status 10/05/2023 FINAL  Final  MRSA Next Gen by PCR, Nasal     Status: None   Collection Time: 10/02/23 11:34 PM   Specimen: Nasal Mucosa; Nasal Swab  Result Value Ref Range Status   MRSA by PCR Next Gen NOT DETECTED NOT DETECTED Final    Comment: (NOTE) The GeneXpert MRSA Assay (FDA approved for NASAL specimens only), is one component of a comprehensive MRSA colonization surveillance program. It is not intended to diagnose MRSA infection nor to guide or monitor treatment for MRSA  infections. Test performance is not FDA approved in patients less than 22 years old. Performed at [facility], [address], [place], [place] [address]   Urine Culture (for pregnant, neutropenic or urologic patients or patients with an indwelling urinary catheter)     Status: Abnormal (Preliminary result)   Collection Time: 10/07/23  1:08 PM   Specimen: Urine, Clean Catch  Result Value Ref Range Status   Specimen Description URINE, CLEAN CATCH  Final   Special Requests NONE  Final   Culture (A)  Final    >=100,000 COLONIES/mL GRAM NEGATIVE RODS SUSCEPTIBILITIES TO FOLLOW Performed at [facility], [address] [place], [place] [address]    Report Status PENDING  Incomplete      Radiology Studies: No results found.    LOS: 7 days    Jacquelin Hawking, MD [facility] 10/09/2023, 11:38 AM   If 7PM-7AM, please contact night-coverage [email or web address]

## 2023-10-10 DIAGNOSIS — G9341 Metabolic encephalopathy: Secondary | ICD-10-CM | POA: Diagnosis not present

## 2023-10-10 LAB — URINE CULTURE: Culture: >=100000 — AB

## 2023-10-10 LAB — GLUCOSE, CAPILLARY
Glucose-Capillary: 100 mg/dL — ABNORMAL HIGH (ref 70–99)
Glucose-Capillary: 157 mg/dL — ABNORMAL HIGH (ref 70–99)
Glucose-Capillary: 102 mg/dL — ABNORMAL HIGH (ref 70–99)
Glucose-Capillary: 100 mg/dL — ABNORMAL HIGH (ref 70–99)

## 2023-10-10 MED ORDER — ENSURE ENLIVE PO LIQD: 237.0000 mL | Freq: Three times a day (TID) | ORAL | Status: DC

## 2023-10-10 MED ORDER — VITAMIN B-1 100 MG PO TABS: 100.0000 mg | ORAL_TABLET | Freq: Every day | ORAL | Status: DC

## 2023-10-10 MED ORDER — TRAMADOL HCL 50 MG PO TABS
50.0000 mg | ORAL_TABLET | Freq: Four times a day (QID) | ORAL | Status: DC
  Administered 2023-10-10 – 2023-10-12 (×7): 50 mg via ORAL
  Filled 2023-10-10 (×7): qty 1

## 2023-10-10 MED ORDER — LEVETIRACETAM 500 MG PO TABS: 500.0000 mg | ORAL_TABLET | Freq: Two times a day (BID) | ORAL | Status: DC

## 2023-10-10 MED ORDER — TRAMADOL HCL 50 MG PO TABS: 50.0000 mg | ORAL_TABLET | Freq: Four times a day (QID) | ORAL | 0 refills | Status: DC | PRN

## 2023-10-10 NOTE — Evaluation (Signed)
Occupational Therapy Evaluation Patient Details Name: Chloe Wilcox MRN: [id number] DOB: 19-Apr-1935 Today's Date: 10/10/2023   History of Present Illness Chloe Wilcox is a 88 y.o. female who presented with altered mentation in addition to seizure activity. Intubated 11/4-11/5. PMHx: COPD, chronic respiratory failure on 3-4 L/min, depression, hypertension, hyperlipidemia, iron deficiency anemia, right hip fracture.   Clinical Impression   Pt is making incremental progress towards their acute OT goals. Upon arrival pt was restless and yelling out but denied pain. Pt perseverating on needing to pee (despite urinating at the start of the session), and difficult to redirect. Overall she followed about 50% of simple functional commands and needed maximal cues to participate in mobility and grooming attempts. Overall she needed total A for bed mobility with a significant posterior lean once her legs were off of the EOB. Pt did not initiate or sequence face washing task and ultimately needed max A. OT to continue to follow acutely to facilitate progress towards established goals. Pt will continue to benefit from skilled inpatient follow up therapy, <3 hours/day.        If plan is discharge home, recommend the following: A lot of help with walking and/or transfers;Two people to help with walking and/or transfers;A lot of help with bathing/dressing/bathroom;Two people to help with bathing/dressing/bathroom;Direct supervision/assist for medications management;Direct supervision/assist for financial management;Assistance with feeding;Assistance with cooking/housework;Help with stairs or ramp for entrance;Assist for transportation;Supervision due to cognitive status       Equipment Recommendations  None recommended by OT       Precautions / Restrictions Precautions Precautions: Fall Restrictions Weight Bearing Restrictions: No      Mobility Bed Mobility Overal bed mobility: Needs Assistance Bed  Mobility: Supine to Sit, Sit to Supine     Supine to sit: Total assist Sit to supine: Total assist   General bed mobility comments: pt not assisting with bed mobility despite maximal cues, limited by confusion    Transfers                   General transfer comment: unable to safety attempt      Balance Overall balance assessment: Needs assistance Sitting-balance support: Feet supported, Bilateral upper extremity supported Sitting balance-Leahy Scale: Poor Sitting balance - Comments: significant posterior lean                   ADL either performed or assessed with clinical judgement   ADL Overall ADL's : Needs assistance/impaired     Grooming: Maximal assistance Grooming Details (indicate cue type and reason): max A for physical assist and cues to participate in task                 Toilet Transfer: Total assistance;+2 for physical assistance;+2 for safety/equipment Toilet Transfer Details (indicate cue type and reason): attempted EOB with total A, unable to correct posterior bias         Functional mobility during ADLs: +2 for safety/equipment;+2 for physical assistance;Total assistance General ADL Comments: limited by cognition, leg pain and posterior bias     Vision   Vision Assessment?: Vision impaired- to be further tested in functional context Additional Comments: R eye intermittently opening throughout the session     Perception Perception: Not tested       Praxis Praxis: Not tested       Pertinent Vitals/Pain Pain Assessment Pain Assessment: Faces Faces Pain Scale: Hurts little more Pain Location: "my leg" Pain Descriptors / Indicators: Grimacing, Guarding, Moaning Pain Intervention(s):  Limited activity within patient's tolerance, Monitored during session     Extremity/Trunk Assessment Upper Extremity Assessment Upper Extremity Assessment: Generalized weakness   Lower Extremity Assessment Lower Extremity Assessment: Defer  to PT evaluation          Cognition Arousal: Alert Behavior During Therapy: Restless Overall Cognitive Status: History of cognitive impairments - at baseline                                 General Comments: following ~50% of basic cues. perseverating on needing to pee. difficulty expressing needs, restless, confused     General Comments  VSS, pt seemingly in pain, states she is attempting to pee.                          OT Goals(Current goals can be found in the care plan section) Acute Rehab OT Goals Patient Stated Goal: to pee? OT Goal Formulation: With patient Time For Goal Achievement: 10/19/23 Potential to Achieve Goals: Good ADL Goals Pt Will Perform Grooming: with min assist;sitting Pt Will Perform Upper Body Dressing: with min assist;sitting Pt Will Perform Lower Body Dressing: with max assist;sit to/from stand Pt Will Transfer to Toilet: with max assist;stand pivot transfer  OT Frequency: Min 1X/week       AM-PAC OT "6 Clicks" Daily Activity     Outcome Measure Help from another person eating meals?: Total Help from another person taking care of personal grooming?: A Lot Help from another person toileting, which includes using toliet, bedpan, or urinal?: Total Help from another person bathing (including washing, rinsing, drying)?: Total Help from another person to put on and taking off regular upper body clothing?: Total Help from another person to put on and taking off regular lower body clothing?: Total 6 Click Score: 7   End of Session Nurse Communication: Mobility status  Activity Tolerance: Patient tolerated treatment well Patient left: in bed;with call bell/phone within reach;with bed alarm set  OT Visit Diagnosis: Unsteadiness on feet (R26.81);Other abnormalities of gait and mobility (R26.89);Muscle weakness (generalized) (M62.81)                Time: 5188-4166 OT Time Calculation (min): 17 min Charges:  OT General Charges $OT  Visit: 1 Visit OT Treatments $Therapeutic Activity: 8-22 mins  Derenda Mis, OTR/L Acute Rehabilitation Services Office [phone] Secure Chat Communication Preferred   Donia Pounds 10/10/2023, 3:05 PM

## 2023-10-10 NOTE — Plan of Care (Signed)
  Problem: Education: Goal: Ability to describe self-care measures that may prevent or decrease complications (Diabetes Survival Skills Education) will improve 10/10/2023 1101 by Elba Barman, RN Outcome: Progressing 10/10/2023 1101 by Elba Barman, RN Outcome: Progressing Goal: Individualized Educational Video(s) 10/10/2023 1101 by Elba Barman, RN Outcome: Progressing 10/10/2023 1101 by Elba Barman, RN Outcome: Progressing   Problem: Coping: Goal: Ability to adjust to condition or change in health will improve 10/10/2023 1101 by Elba Barman, RN Outcome: Progressing 10/10/2023 1101 by Elba Barman, RN Outcome: Progressing   Problem: Fluid Volume: Goal: Ability to maintain a balanced intake and output will improve 10/10/2023 1101 by Elba Barman, RN Outcome: Progressing 10/10/2023 1101 by Elba Barman, RN Outcome: Progressing   Problem: Health Behavior/Discharge Planning: Goal: Ability to identify and utilize available resources and services will improve 10/10/2023 1101 by Elba Barman, RN Outcome: Progressing 10/10/2023 1101 by Elba Barman, RN Outcome: Progressing Goal: Ability to manage health-related needs will improve 10/10/2023 1101 by Elba Barman, RN Outcome: Progressing 10/10/2023 1101 by Elba Barman, RN Outcome: Progressing   Problem: Metabolic: Goal: Ability to maintain appropriate glucose levels will improve Outcome: Progressing   Problem: Nutritional: Goal: Maintenance of adequate nutrition will improve Outcome: Progressing Goal: Progress toward achieving an optimal weight will improve Outcome: Progressing   Problem: Skin Integrity: Goal: Risk for impaired skin integrity will decrease Outcome: Progressing   Problem: Tissue Perfusion: Goal: Adequacy of tissue perfusion will improve Outcome: Progressing   Problem: Education: Goal: Knowledge of General Education information will improve Description: Including pain rating  scale, medication(s)/side effects and non-pharmacologic comfort measures Outcome: Progressing   Problem: Health Behavior/Discharge Planning: Goal: Ability to manage health-related needs will improve Outcome: Progressing   Problem: Clinical Measurements: Goal: Ability to maintain clinical measurements within normal limits will improve Outcome: Progressing Goal: Will remain free from infection Outcome: Progressing Goal: Diagnostic test results will improve Outcome: Progressing Goal: Respiratory complications will improve Outcome: Progressing Goal: Cardiovascular complication will be avoided Outcome: Progressing   Problem: Activity: Goal: Risk for activity intolerance will decrease Outcome: Progressing   Problem: Nutrition: Goal: Adequate nutrition will be maintained Outcome: Progressing   Problem: Coping: Goal: Level of anxiety will decrease Outcome: Progressing   Problem: Elimination: Goal: Will not experience complications related to bowel motility Outcome: Progressing Goal: Will not experience complications related to urinary retention Outcome: Progressing   Problem: Pain Management: Goal: General experience of comfort will improve Outcome: Progressing   Problem: Safety: Goal: Ability to remain free from injury will improve Outcome: Progressing   Problem: Skin Integrity: Goal: Risk for impaired skin integrity will decrease Outcome: Progressing

## 2023-10-10 NOTE — TOC Progression Note (Addendum)
Transition of Care [facility]) - Progression Note    Patient Details  Name: [name] MRN: [id number] Date of Birth: September 17, 1935  Transition of Care [facility]) CM/SW Contact  Erin Sons, [place] Phone Number: 10/10/2023, 1:21 PM  Clinical Narrative:     [facility] requiring Berkley Harvey prior to pt return. Auth was started yesterday and is pending.   CSW called granddaughter and provided update. Family is agreeable for pt to return to [facility].   1300: Peer to Peer offered by insurance. Attending would need to call [phone] and select option 5. Due by 5pm today.  1550: Peer to Peer completed and Insurance is requesting update therapy notes. CSW uploaded  recent clinicals to pts auth in online portal.     Expected Discharge Plan: Skilled Nursing Facility Barriers to Discharge: Continued Medical Work up, English as a second language teacher  Expected Discharge Plan and Services In-house Referral: Clinical Social Work   Post Acute Care Choice: Skilled Nursing Facility Living arrangements for the past 2 months: Skilled Nursing Facility                                       Social Determinants of Health (SDOH) Interventions SDOH Screenings   Food Insecurity: No Food Insecurity (10/05/2023)  Housing: Low Risk  (10/05/2023)  Transportation Needs: No Transportation Needs (10/05/2023)  Utilities: Not At Risk (10/05/2023)  Tobacco Use: Medium Risk (10/05/2023)    Readmission Risk Interventions     No data to display

## 2023-10-10 NOTE — Plan of Care (Signed)
  Problem: Education: Goal: Ability to describe self-care measures that may prevent or decrease complications (Diabetes Survival Skills Education) will improve Outcome: Progressing   Problem: Coping: Goal: Ability to adjust to condition or change in health will improve Outcome: Progressing   Problem: Fluid Volume: Goal: Ability to maintain a balanced intake and output will improve Outcome: Progressing   Problem: Nutritional: Goal: Maintenance of adequate nutrition will improve Outcome: Progressing   Problem: Skin Integrity: Goal: Risk for impaired skin integrity will decrease Outcome: Progressing   Problem: Tissue Perfusion: Goal: Adequacy of tissue perfusion will improve Outcome: Progressing

## 2023-10-10 NOTE — Progress Notes (Signed)
PROGRESS NOTE    Chloe Wilcox  ZHY:[id number] DOB: 08/25/1935 DOA: 10/02/2023 PCP: Noberto Retort, MD   Brief Narrative: Chloe Wilcox is a 88 y.o. female with a history of likely COPD, chronic respiratory failure on 3-4 L/min, depression, hypertension, hyperlipidemia, iron deficiency anemia, right hip fracture.  Patient presented secondary to altered mentation in addition to seizure active with mouth foaming. Patient was admitted to ICU for a diagnosis of hypertensive emergency with concern for possible seizure activity as well. Patient required short intubation and Cleviprex. Mentation improving slowly. Seizure workup revealed no evidence of seizure.   Assessment and Plan:  Hypertensive emergency Associated encephalopathy and demand ischemia. Patient admitted to ICU on Cleviprex drip which was quickly weaned off. -irbesartan (substituted for home olmesartan)  Hypertensive encephalopathy Initial concern. Patient managed shortly with Celviprex drip. Status epilepticus unlikely with results of EEG testing.  Acute on chronic respiratory failure with hypoxia Patient was intubated on admission, 11/4 and extubated on 11/5. -Continue oxygen supplementation  Fever Tmax of 98.7 F. No leukocytosis. Asymptomatic, per patient. Afebrile over the past 24 hours. Resolved,  UTI Concern for possible dehydration. Possibly related to high bilirubin content. No improvement with IV fluids overnight. Urinalysis suggests bacterial infection. With associated confusion and fever, will treat empirically -Urine culture pending -Ceftriaxone  g IV x3 days  Demand ischemia Elevated troponin without chest pain. In setting of hypertensive emergency. EKG without acute ischemic changes.  Hypokalemia -Potassium supplementation  COPD -Continue Brovana, Pulmicort and Yupelri  Dysphagia Barium swallow identifies severe mid-esophageal dysmotility. Discussed with [facility], Dr. Tomasa Rand, with  recommendation for no upper endoscopy secondary to high risk and anesthesia need. Option for patient to follow-up outpatient, but after discussion with granddaughter, will likely defer outpatient follow-up. Speech therapy recommendations (11/8): Diet recommendations: Thin liquid;Dysphagia 1 (puree) Liquids provided via: Straw;Cup Medication Administration: Crushed with puree Supervision: Staff to assist with self feeding;Full supervision/cueing for compensatory strategies;Trained caregiver to feed patient Compensations: Minimize environmental distractions;Slow rate;Small sips/bites Postural Changes and/or Swallow Maneuvers: Seated upright 90 degrees  Severe protein malnutrition -Dietitian consult  Chronic anemia Stable. No evidence of hemorrhaging.  History of bilateral cerebellar infarcts Noted. -Continue aspirin  Depression -Continue Celexa  Pressure injury Coccyx, present on admission.   DVT prophylaxis: Heparin subq Code Status:   Code Status: Full Code Family Communication: None at bedside. Granddaughter on telephone Disposition Plan: Discharge to SNF pending bed availability   Consultants:  PCCM Neurology [facility] (curbside)  Procedures:  Intubation/extubation EEG  Antimicrobials: Ceftriaxone IV    Subjective: No concerns. Not concerned about her lack of oral intake. No trouble with swallowing.  Objective: BP 133/71 (BP Location: Right Arm)   Pulse 78   Temp 98.1 F (36.7 C) (Oral)   Resp 16   Ht 5\' 2"  (1.575 m)   Wt 48.6 kg   SpO2 100%   BMI 19.60 kg/m   Examination:  General exam: Appears calm and comfortable Respiratory system: Clear to auscultation. Respiratory effort normal. Cardiovascular system: S1 & S2 heard, RRR. Gastrointestinal system: Abdomen is nondistended, soft and nontender. Normal bowel sounds heard. Central nervous system: Alert and oriented.   Data Reviewed: I have personally reviewed following labs and  imaging studies  CBC Lab Results  Component Value Date   WBC 8.4 10/05/2023   RBC 3.37 (L) 10/05/2023   HGB 9.5 (L) 10/05/2023   HCT 30.9 (L) 10/05/2023   MCV 91.7 10/05/2023   MCH 28.2 10/05/2023   PLT 189 10/05/2023  MCHC 30.7 10/05/2023   RDW 13.7 10/05/2023   LYMPHSABS 0.6 (L) 07/10/2023   MONOABS 0.6 07/10/2023   EOSABS 0.1 07/10/2023   BASOSABS 0.0 07/10/2023     Last metabolic panel Lab Results  Component Value Date   NA 141 10/07/2023   K 4.1 10/09/2023   CL 103 10/07/2023   CO2 31 10/07/2023   BUN 14 10/07/2023   CREATININE 0.42 (L) 10/07/2023   GLUCOSE 92 10/07/2023   GFRNONAA >60 10/07/2023   GFRAA >60 07/25/2020   CALCIUM 9.8 10/07/2023   PHOS 3.4 10/03/2023   PROT 6.1 (L) 07/10/2023   ALBUMIN 3.9 07/10/2023   BILITOT 0.7 07/10/2023   ALKPHOS 64 07/10/2023   AST 17 07/10/2023   ALT 16 07/10/2023   ANIONGAP 7 10/07/2023    GFR: Estimated Creatinine Clearance: 38.7 mL/min (A) (by C-G formula based on SCr of 0.42 mg/dL (L)).  Recent Results (from the past 240 hour(s))  Culture, Respiratory w Gram Stain (tracheal aspirate)     Status: None   Collection Time: 10/02/23 11:34 PM   Specimen: Tracheal Aspirate; Respiratory  Result Value Ref Range Status   Specimen Description TRACHEAL ASPIRATE  Final   Special Requests NONE  Final   Gram Stain   Final    FEW WBC PRESENT, PREDOMINANTLY PMN FEW GRAM POSITIVE COCCI    Culture   Final    Normal respiratory flora-no Staph aureus or Pseudomonas seen Performed at [facility], [address] [place], [place] [address]    Report Status 10/05/2023 FINAL  Final  MRSA Next Gen by PCR, Nasal     Status: None   Collection Time: 10/02/23 11:34 PM   Specimen: Nasal Mucosa; Nasal Swab  Result Value Ref Range Status   MRSA by PCR Next Gen NOT DETECTED NOT DETECTED Final    Comment: (NOTE) The GeneXpert MRSA Assay (FDA approved for NASAL specimens only), is one component of a comprehensive MRSA  colonization surveillance program. It is not intended to diagnose MRSA infection nor to guide or monitor treatment for MRSA infections. Test performance is not FDA approved in patients less than 82 years old. Performed at [facility], [address] [place], [place] [address]   Urine Culture (for pregnant, neutropenic or urologic patients or patients with an indwelling urinary catheter)     Status: Abnormal   Collection Time: 10/07/23  1:08 PM   Specimen: Urine, Clean Catch  Result Value Ref Range Status   Specimen Description URINE, CLEAN CATCH  Final   Special Requests   Final    NONE Performed at [facility], [address], [place], [place] [address]    Culture >=100,000 COLONIES/mL ESCHERICHIA COLI (A)  Final   Report Status 10/10/2023 FINAL  Final   Organism ID, Bacteria ESCHERICHIA COLI (A)  Final      Susceptibility   Escherichia coli - MIC*    AMPICILLIN 8 SENSITIVE Sensitive     CEFAZOLIN <=4 SENSITIVE Sensitive     CEFEPIME <=0.12 SENSITIVE Sensitive     CEFTRIAXONE <=0.25 SENSITIVE Sensitive     CIPROFLOXACIN <=0.25 SENSITIVE Sensitive     GENTAMICIN <=1 SENSITIVE Sensitive     IMIPENEM <=0.25 SENSITIVE Sensitive     NITROFURANTOIN <=16 SENSITIVE Sensitive     TRIMETH/SULFA <=20 SENSITIVE Sensitive     AMPICILLIN/SULBACTAM <=2 SENSITIVE Sensitive     PIP/TAZO <=4 SENSITIVE Sensitive ug/mL    * >=100,000 COLONIES/mL ESCHERICHIA COLI  Radiology Studies: DG Lumbar Spine 2-3 Views  Result Date: 10/09/2023 CLINICAL DATA:  Fall with back pain EXAM: LUMBAR SPINE - 2-3 VIEW COMPARISON:  Radiograph 10/03/2023, lumbar radiograph 08/03/2020 FINDINGS: Limited by the presence of enteral contrast and large calcified fibroid in the pelvis. Levoscoliosis. L4 and L5 vertebral bodies are obscured by enteral contrast. Advanced degenerative changes at L3-L4 with mild disc space narrowing at the remaining lumbar levels. Imaged vertebral bodies demonstrate grossly  normal stature. IMPRESSION: L4 on L5 are obscured by contrast. No acute osseous abnormality allowing for this. Multilevel degenerative changes. Electronically Signed   By: Jasmine Pang M.D.   On: 10/09/2023 20:52   DG Thoracic Spine 2 View  Result Date: 10/09/2023 CLINICAL DATA:  Fall EXAM: THORACIC SPINE 2 VIEWS COMPARISON:  CT 09/23/2020 FINDINGS: Scoliosis. Vertebral body heights are maintained. Multilevel degenerative osteophytes. IMPRESSION: Scoliosis and degenerative change. Electronically Signed   By: Jasmine Pang M.D.   On: 10/09/2023 20:48   DG ESOPHAGUS W SINGLE CM (SOL OR THIN BA)  Result Date: 10/09/2023 CLINICAL DATA:  Dysphagia. Recommendation for formal barium esophagram following modified barium evaluation with speech pathology. EXAM: ESOPHAGUS/BARIUM SWALLOW/TABLET STUDY TECHNIQUE: Single contrast examination was performed using thin liquid barium. Procedure performed by Brayton El PA-C and supervised by Dr. Marliss Coots Study is somewhat limited given patient's inability to stand or lay prone. FLUOROSCOPY: Radiation Exposure Index (as provided by the fluoroscopic device): 16.70 mGy Kerma COMPARISON:  None Available. FINDINGS: Swallowing: Appears normal. No vestibular penetration or aspiration seen. Pharynx: Unremarkable. Esophagus: No definitive mucosal lesions. Both tertiary and absent contractions resulting contrast stasis and pooling within the mid esophagus. Hiatal Hernia: None. Gastroesophageal reflux: None visualized. Ingested 13 mm barium tablet: Became lodged in the proximal lower esophagus, never reaching the GE junction. Despite additional swallows of thin barium and water, tablet would not move beyond this point. IMPRESSION: Limited study due to patient age and immobility. No definitive mucosal lesions or strictures. Findings consistent with significant dysmotility resulting in contrast stasis and pooling in the mid esophagus. Tablet would not pass beyond the lower  esophagus. Electronically Signed   By: Marliss Coots M.D.   On: 10/09/2023 14:27      LOS: 8 days    Jacquelin Hawking, MD [facility] 10/10/2023, 11:31 AM   If 7PM-7AM, please contact night-coverage [email or web address]

## 2023-10-11 DIAGNOSIS — G9341 Metabolic encephalopathy: Secondary | ICD-10-CM | POA: Diagnosis not present

## 2023-10-11 LAB — GLUCOSE, CAPILLARY
Glucose-Capillary: 65 mg/dL — ABNORMAL LOW (ref 70–99)
Glucose-Capillary: 70 mg/dL (ref 70–99)
Glucose-Capillary: 105 mg/dL — ABNORMAL HIGH (ref 70–99)
Glucose-Capillary: 101 mg/dL — ABNORMAL HIGH (ref 70–99)
Glucose-Capillary: 112 mg/dL — ABNORMAL HIGH (ref 70–99)

## 2023-10-11 MED ORDER — ADULT MULTIVITAMIN W/MINERALS CH
1.0000 | ORAL_TABLET | Freq: Every day | ORAL | Status: DC
  Administered 2023-10-12 – 2023-10-13 (×2): 1 via ORAL
  Filled 2023-10-11 (×2): qty 1

## 2023-10-11 MED ORDER — CEFADROXIL 500 MG PO CAPS
500.0000 mg | ORAL_CAPSULE | Freq: Two times a day (BID) | ORAL | Status: AC
  Administered 2023-10-11 – 2023-10-12 (×4): 500 mg via ORAL
  Filled 2023-10-11 (×4): qty 1

## 2023-10-11 MED ORDER — VITAMIN C 500 MG PO TABS
250.0000 mg | ORAL_TABLET | Freq: Two times a day (BID) | ORAL | Status: DC
  Administered 2023-10-11 – 2023-10-13 (×4): 250 mg via ORAL
  Filled 2023-10-11 (×4): qty 1

## 2023-10-11 NOTE — Plan of Care (Signed)

## 2023-10-11 NOTE — Progress Notes (Signed)
PROGRESS NOTE    Chloe Wilcox  WUX:[id number] DOB: Apr 10, 1935 DOA: 10/02/2023 PCP: Noberto Retort, MD  Chief Complaint  Patient presents with   Seizures   Respiratory Distress    Brief Narrative:   Chloe Wilcox is Chloe Wilcox 88 y.o. female with Chloe Wilcox history of likely COPD, chronic respiratory failure on 3-4 L/min, depression, hypertension, hyperlipidemia, iron deficiency anemia, right hip fracture. Patient presented secondary to altered mentation in addition to seizure active with mouth foaming. Patient was admitted to ICU for Chloe Wilcox diagnosis of hypertensive emergency with concern for possible seizure activity as well. Patient required short intubation and Cleviprex. Mentation improving slowly. Seizure workup revealed no evidence of seizure.   Assessment & Plan:   Principal Problem:   Acute metabolic encephalopathy Active Problems:   Protein-calorie malnutrition, severe  Hypertensive emergency Associated encephalopathy and demand ischemia. Patient admitted to ICU on Cleviprex drip which was quickly weaned off. -irbesartan (substituted for home olmesartan)   Hypertensive encephalopathy Initial concern. Patient managed shortly with Celviprex drip. Status epilepticus unlikely with results of EEG testing. Improved, continue to monitor Delirium precautions   Acute on chronic respiratory failure with hypoxia COPD Patient was intubated on admission, 11/4 and extubated on 11/5. -Continue oxygen supplementation -> wean as able (apparently wears 3-4 L at home) - CXR 11/5 without acute abnormality   Fever Resolved, last fever 11/6   E. Coli UTI With associated confusion and fever, will treat empirically -Urine culture pending -s/p Ceftriaxone  g IV x3 days.  Will transition to duricef.   Demand ischemia Elevated troponin without chest pain. In setting of hypertensive emergency. EKG without acute ischemic changes. Echo without regional [facility] Outpatient cardiology follow up  Lipid panel,  A1c Aspirin, lipitor   Hypokalemia -Potassium supplementation   COPD -Continue Brovana, Pulmicort and Yupelri   Dysphagia Barium swallow identifies severe mid-esophageal dysmotility. Discussed with Chloe Wilcox, Chloe Wilcox, with recommendation for no upper endoscopy secondary to high risk and anesthesia need. Option for patient to follow-up outpatient, but after discussion with granddaughter, will likely defer outpatient follow-up. Speech therapy recommendations (11/8): Diet recommendations: Thin liquid;Dysphagia 1 (puree) Liquids provided via: Straw;Cup Medication Administration: Crushed with puree Supervision: Staff to assist with self feeding;Full supervision/cueing for compensatory strategies;Trained caregiver to feed patient Compensations: Minimize environmental distractions;Slow rate;Small sips/bites Postural Changes and/or Swallow Maneuvers: Seated upright 90 degrees   Severe protein malnutrition -Dietitian consult   Chronic anemia Stable. No evidence of hemorrhaging.   History of bilateral cerebellar infarcts -Continue aspirin, statin   Depression -Continue Celexa   Pressure injury Coccyx, present on admission.     DVT prophylaxis: heparin Code Status: full Family Communication: none Disposition:   Status is: Inpatient Remains inpatient appropriate because: need for continued inpatient care   Consultants:  PCCM  Procedures:  Echo IMPRESSIONS     1. Left ventricular ejection fraction, by estimation, is 70 to 75%. The  left ventricle has hyperdynamic function. The left ventricle has no  regional wall motion abnormalities. There is mild left ventricular  hypertrophy. Left ventricular diastolic  parameters are indeterminate.   2. Right ventricular systolic function is normal. The right ventricular  size is normal. There is mildly elevated pulmonary artery systolic  pressure. The estimated right ventricular systolic pressure is 38.2 mmHg.   3. The  mitral valve is grossly normal. Mild mitral valve regurgitation.  No evidence of mitral stenosis.   4. The aortic valve is tricuspid. There is mild calcification of the  aortic valve. Aortic valve  regurgitation is not visualized. Aortic valve  sclerosis is present, with no evidence of aortic valve stenosis.   5. The inferior vena cava is normal in size with <50% respiratory  variability, suggesting right atrial pressure of 8 mmHg.    Antimicrobials:  Anti-infectives (From admission, onward)    Start     Dose/Rate Route Frequency Ordered Stop   10/08/23 1230  cefTRIAXone (ROCEPHIN) 1 g in sodium chloride 0.9 % 100 mL IVPB        1 g 200 mL/hr over 30 Minutes Intravenous Every 24 hours 10/08/23 1135 10/10/23 1255       Subjective: No new complaints  Objective: Vitals:   10/11/23 0742 10/11/23 0859 10/11/23 1040 10/11/23 1530  BP: (!) 142/70  (!) 144/61 (!) 154/62  Pulse: (!) 59  68 72  Resp: 17  19 19   Temp: 97.9 F (36.6 C)  98 F (36.7 C) (!) 97.5 F (36.4 C)  TempSrc: Oral  Oral Oral  SpO2: 99% 100% 100% 100%  Weight:      Height:        Intake/Output Summary (Last 24 hours) at 10/11/2023 1534 Last data filed at 10/11/2023 1210 Gross per 24 hour  Intake 578 ml  Output 1150 ml  Net -572 ml   Filed Weights   10/05/23 0500 10/06/23 0500 10/10/23 0500  Weight: 46.9 kg 49.2 kg 48.6 kg    Examination:  General exam: Appears calm and comfortable  Respiratory system: unlabored Cardiovascular system: RRR Gastrointestinal system: Abdomen is nondistended, soft and nontender. Central nervous system: Alert.     Data Reviewed: I have personally reviewed following labs and imaging studies  CBC: Recent Labs  Lab 10/05/23 0818  WBC 8.4  HGB 9.5*  HCT 30.9*  MCV 91.7  PLT 189    Basic Metabolic Panel: Recent Labs  Lab 10/05/23 0818 10/07/23 0438 10/09/23 1001  NA 142 141  --   K 3.2* 3.3* 4.1  CL 101 103  --   CO2 29 31  --   GLUCOSE 107* 92  --    BUN 8 14  --   CREATININE 0.40* 0.42*  --   CALCIUM 9.7 9.8  --     GFR: Estimated Creatinine Clearance: 38.7 mL/min (Chloe Wilcox) (by C-G formula based on SCr of 0.42 mg/dL (L)).  Liver Function Tests: No results for input(s): "AST", "ALT", "ALKPHOS", "BILITOT", "PROT", "ALBUMIN" in the last 168 hours.  CBG: Recent Labs  Lab 10/10/23 1149 10/10/23 1708 10/10/23 2110 10/11/23 0647 10/11/23 1058  GLUCAP 157* 102* 100* 70 105*     Recent Results (from the past 240 hour(s))  Culture, Respiratory w Gram Stain (tracheal aspirate)     Status: None   Collection Time: 10/02/23 11:34 PM   Specimen: Tracheal Aspirate; Respiratory  Result Value Ref Range Status   Specimen Description TRACHEAL ASPIRATE  Final   Special Requests NONE  Final   Gram Stain   Final    FEW WBC PRESENT, PREDOMINANTLY PMN FEW GRAM POSITIVE COCCI    Culture   Final    Normal respiratory flora-no Staph aureus or Pseudomonas seen Performed at [facility], [address] [place], [place] [address]    Report Status 10/05/2023 FINAL  Final  MRSA Next Gen by PCR, Nasal     Status: None   Collection Time: 10/02/23 11:34 PM   Specimen: Nasal Mucosa; Nasal Swab  Result Value Ref Range Status   MRSA by PCR Next Gen NOT DETECTED  NOT DETECTED Final    Comment: (NOTE) The GeneXpert MRSA Assay (FDA approved for NASAL specimens only), is one component of [facility] comprehensive MRSA colonization surveillance program. It is not intended to diagnose MRSA infection nor to guide or monitor treatment for MRSA infections. Test performance is not FDA approved in patients less than 61 years old. Performed at [facility] Lab, [address] [place], [place] [address]   Urine Culture (for pregnant, neutropenic or urologic patients or patients with an indwelling urinary catheter)     Status: Abnormal   Collection Time: 10/07/23  1:08 PM   Specimen: Urine, Clean Catch  Result Value Ref Range Status   Specimen Description URINE,  CLEAN CATCH  Final   Special Requests   Final    NONE Performed at [facility], [address] [place], [place] [address]    Culture >=100,000 COLONIES/mL ESCHERICHIA COLI (Chanze Teagle)  Final   Report Status 10/10/2023 FINAL  Final   Organism ID, Bacteria ESCHERICHIA COLI (Angelita Harnack)  Final      Susceptibility   Escherichia coli - MIC*    AMPICILLIN 8 SENSITIVE Sensitive     CEFAZOLIN <=4 SENSITIVE Sensitive     CEFEPIME <=0.12 SENSITIVE Sensitive     CEFTRIAXONE <=0.25 SENSITIVE Sensitive     CIPROFLOXACIN <=0.25 SENSITIVE Sensitive     GENTAMICIN <=1 SENSITIVE Sensitive     IMIPENEM <=0.25 SENSITIVE Sensitive     NITROFURANTOIN <=16 SENSITIVE Sensitive     TRIMETH/SULFA <=20 SENSITIVE Sensitive     AMPICILLIN/SULBACTAM <=2 SENSITIVE Sensitive     PIP/TAZO <=4 SENSITIVE Sensitive ug/mL    * >=100,000 COLONIES/mL ESCHERICHIA COLI         Radiology Studies: DG Lumbar Spine 2-3 Views  Result Date: 10/09/2023 CLINICAL DATA:  Fall with back pain EXAM: LUMBAR SPINE - 2-3 VIEW COMPARISON:  Radiograph 10/03/2023, lumbar radiograph 08/03/2020 FINDINGS: Limited by the presence of enteral contrast and large calcified fibroid in the pelvis. Levoscoliosis. L4 and L5 vertebral bodies are obscured by enteral contrast. Advanced degenerative changes at L3-L4 with mild disc space narrowing at the remaining lumbar levels. Imaged vertebral bodies demonstrate grossly normal stature. IMPRESSION: L4 on L5 are obscured by contrast. No acute osseous abnormality allowing for this. Multilevel degenerative changes. Electronically Signed   By: Jasmine Pang M.D.   On: 10/09/2023 20:52   DG Thoracic Spine 2 View  Result Date: 10/09/2023 CLINICAL DATA:  Fall EXAM: THORACIC SPINE 2 VIEWS COMPARISON:  CT 09/23/2020 FINDINGS: Scoliosis. Vertebral body heights are maintained. Multilevel degenerative osteophytes. IMPRESSION: Scoliosis and degenerative change. Electronically Signed   By: Jasmine Pang M.D.   On: 10/09/2023  20:48        Scheduled Meds:  arformoterol  15 mcg Nebulization BID   ascorbic acid  250 mg Oral BID   aspirin  81 mg Oral Daily   Or   aspirin  300 mg Rectal Daily   atorvastatin  40 mg Oral QHS   budesonide (PULMICORT) nebulizer solution  0.5 mg Nebulization BID   Chlorhexidine Gluconate Cloth  6 each Topical Daily   citalopram  10 mg Oral Daily   feeding supplement  237 mL Oral TID BM   heparin  5,000 Units Subcutaneous Q8H   insulin aspart  0-9 Units Subcutaneous TID WC   irbesartan  150 mg Oral Daily   levETIRAcetam  500 mg Oral BID   [START ON 10/12/2023] multivitamin with minerals  1 tablet Oral Daily   mouth rinse  15 mL Mouth Rinse 4 times per day   revefenacin  175 mcg Nebulization Daily   thiamine  100 mg Oral Daily   traMADol  50 mg Oral Q6H   Continuous Infusions:   LOS: 9 days    Time spent: over 30 min    Lacretia Nicks, MD [facility]   To contact the attending provider between 7A-7P or the covering provider during after hours 7P-7A, please log into the web site [email or web address] and access using universal [facility] password for that web site. If you do not have the password, please call the hospital operator.  10/11/2023, 3:34 PM

## 2023-10-11 NOTE — TOC Progression Note (Addendum)
Transition of Care [facility]) - Progression Note    Patient Details  Name: Chloe Wilcox MRN: [id number] Date of Birth: [birth year]  Transition of Care [facility]) CM/SW Contact  Baldemar Lenis, [place] Phone Number: 10/11/2023, 10:37 AM  Clinical Narrative:   CSW following for SNF placement. Insurance authorization is still pending at this time.  UPDATE: CSW notified via [facility] portal that patient's request for SNF has been denied. CSW to follow.    Expected Discharge Plan: Skilled Nursing Facility Barriers to Discharge: Continued Medical Work up, English as a second language teacher  Expected Discharge Plan and Services In-house Referral: Clinical Social Work   Post Acute Care Choice: Skilled Nursing Facility Living arrangements for the past 2 months: Skilled Nursing Facility                                       Social Determinants of Health (SDOH) Interventions SDOH Screenings   Food Insecurity: No Food Insecurity (10/05/2023)  Housing: Low Risk  (10/05/2023)  Transportation Needs: No Transportation Needs (10/05/2023)  Utilities: Not At Risk (10/05/2023)  Tobacco Use: Medium Risk (10/05/2023)    Readmission Risk Interventions     No data to display

## 2023-10-11 NOTE — Plan of Care (Signed)
  Problem: Education: Goal: Ability to describe self-care measures that may prevent or decrease complications (Diabetes Survival Skills Education) will improve Outcome: Not Progressing   Problem: Fluid Volume: Goal: Ability to maintain a balanced intake and output will improve Outcome: Progressing   Problem: Health Behavior/Discharge Planning: Goal: Ability to identify and utilize available resources and services will improve Outcome: Not Progressing Goal: Ability to manage health-related needs will improve Outcome: Not Progressing   Problem: Metabolic: Goal: Ability to maintain appropriate glucose levels will improve Outcome: Progressing   Problem: Nutritional: Goal: Maintenance of adequate nutrition will improve Outcome: Progressing Goal: Progress toward achieving an optimal weight will improve Outcome: Progressing   Problem: Skin Integrity: Goal: Risk for impaired skin integrity will decrease Outcome: Progressing   Problem: Tissue Perfusion: Goal: Adequacy of tissue perfusion will improve Outcome: Progressing   Problem: Education: Goal: Knowledge of General Education information will improve Description: Including pain rating scale, medication(s)/side effects and non-pharmacologic comfort measures Outcome: Not Progressing   Problem: Health Behavior/Discharge Planning: Goal: Ability to manage health-related needs will improve Outcome: Not Progressing   Problem: Clinical Measurements: Goal: Ability to maintain clinical measurements within normal limits will improve Outcome: Progressing Goal: Will remain free from infection Outcome: Progressing Goal: Diagnostic test results will improve Outcome: Progressing Goal: Respiratory complications will improve Outcome: Progressing Goal: Cardiovascular complication will be avoided Outcome: Progressing   Problem: Activity: Goal: Risk for activity intolerance will decrease Outcome: Not Progressing   Problem:  Nutrition: Goal: Adequate nutrition will be maintained Outcome: Progressing   Problem: Pain Management: Goal: General experience of comfort will improve Outcome: Progressing   Problem: Safety: Goal: Ability to remain free from injury will improve Outcome: Progressing   Problem: Skin Integrity: Goal: Risk for impaired skin integrity will decrease Outcome: Progressing

## 2023-10-11 NOTE — Progress Notes (Signed)
Nutrition Follow-up  DOCUMENTATION CODES:   Severe malnutrition in context of social or environmental circumstances  INTERVENTION:   Ensure Enlive po TID, each supplement provides 350 kcal and 20 grams of protein.  Magic cup TID with meals, each supplement provides 290 kcal and 9 grams of protein  Pt remains at refeed risk; recommend monitor potassium, magnesium and phosphorus labs daily until stable  MVI po daily   Vitamin C 250mg  po BID  Daily weights   NUTRITION DIAGNOSIS:   Severe Malnutrition related to social / environmental circumstances as evidenced by severe muscle depletion, severe fat depletion. -ongoing   GOAL:   Patient will meet greater than or equal to 90% of their needs -progressing   MONITOR:   PO intake, Supplement acceptance, Weight trends, Labs, Skin, I & O's  ASSESSMENT:   88 y/o female with h/o dementia, COPD, HTN, recent hip fracture (August), HLD and depression who is admitted from SNF with possible seizures.  RD working remotely.  Pt with improved appetite and oral intake after upgrading to a mechanical soft diet. Pt documented to be eating 25-75% of meals; pt ate 50% of her breakfast this morning. Pt does require full assist. RD will add Magic Cups to meal trays. RD will add vitamins to support wound healing. Per chart, pt is up ~9lbs since admission. Pt remains down about 17lbs(14%) since suffering from a hip fracture in August.   Spoke with pt's granddaughter via phone. Granddaughter reports that pt's appetite is good, but reports that she does not really like the food at her SNF; pt may only take bites of her meals. Provided granddaughter with education on how to increase calories and protein in her grandmother's diet. Discussed use of supplements and protein powders and how to add ingredients such as cream, milk powder, cheese, etc to add additional calories. Granddaughter very Adult nurse of education. Pt is currently pending insurance  authorization to return to SNF.    Medications reviewed and include: aspirin, celexa, heparin, insulin, thiamine, tramadol   Labs reviewed: K 4.1 wnl- 11/11  Diet Order:   Diet Order             DIET DYS 3 Room service appropriate? Yes with Assist; Fluid consistency: Thin  Diet effective now                  EDUCATION NEEDS:   Not appropriate for education at this time  Skin:  Skin Assessment: Reviewed RN Assessment (Stage II coccyx)  Last BM:  11/12- type 6  Height:   Ht Readings from Last 1 Encounters:  10/02/23 5\' 2"  (1.575 m)    Weight:   Wt Readings from Last 1 Encounters:  10/10/23 48.6 kg   BMI:  Body mass index is 19.6 kg/m.  Estimated Nutritional Needs:   Kcal:  1300-1500kcal/day  Protein:  65-75g/day  Fluid:  1.2-1.4L/day  Betsey Holiday MS, RD, LDN Please refer to [facility] for RD and/or RD on-call/weekend/after hours pager

## 2023-10-12 ENCOUNTER — Inpatient Hospital Stay (HOSPITAL_COMMUNITY): Payer: Medicare Other

## 2023-10-12 DIAGNOSIS — G9341 Metabolic encephalopathy: Secondary | ICD-10-CM | POA: Diagnosis not present

## 2023-10-12 LAB — GLUCOSE, CAPILLARY
Glucose-Capillary: 70 mg/dL (ref 70–99)
Glucose-Capillary: 95 mg/dL (ref 70–99)
Glucose-Capillary: 83 mg/dL (ref 70–99)
Glucose-Capillary: 46 mg/dL — ABNORMAL LOW (ref 70–99)
Glucose-Capillary: 61 mg/dL — ABNORMAL LOW (ref 70–99)
Glucose-Capillary: 71 mg/dL (ref 70–99)

## 2023-10-12 LAB — PHOSPHORUS: Phosphorus: 4 mg/dL (ref 2.5–4.6)

## 2023-10-12 LAB — HEMOGLOBIN A1C
Hgb A1c MFr Bld: 4.8 % (ref 4.8–5.6)
Mean Plasma Glucose: 91.06 mg/dL

## 2023-10-12 LAB — BASIC METABOLIC PANEL
Anion gap: 10 (ref 5–15)
BUN: 13 mg/dL (ref 8–23)
CO2: 30 mmol/L (ref 22–32)
Calcium: 9.2 mg/dL (ref 8.9–10.3)
Chloride: 98 mmol/L (ref 98–111)
Creatinine, Ser: 0.64 mg/dL (ref 0.44–1.00)
GFR, Estimated: >60 mL/min (ref >60)
Glucose, Bld: 79 mg/dL (ref 70–99)
Potassium: 4.6 mmol/L (ref 3.5–5.1)
Sodium: 138 mmol/L (ref 135–145)

## 2023-10-12 LAB — LIPID PANEL
Cholesterol: 121 mg/dL (ref 0–200)
HDL: 71 mg/dL (ref >40)
LDL Cholesterol: 42 mg/dL (ref 0–99)
Total CHOL/HDL Ratio: 1.7 {ratio}
Triglycerides: 41 mg/dL (ref <150)
VLDL: 8 mg/dL (ref 0–40)

## 2023-10-12 LAB — MAGNESIUM: Magnesium: 2 mg/dL (ref 1.7–2.4)

## 2023-10-12 MED ORDER — CEFADROXIL 500 MG PO CAPS: 500.0000 mg | ORAL_CAPSULE | Freq: Two times a day (BID) | ORAL | Status: AC

## 2023-10-12 MED ORDER — OLMESARTAN MEDOXOMIL 20 MG PO TABS: 20.0000 mg | ORAL_TABLET | Freq: Every day | ORAL | Status: DC

## 2023-10-12 MED ORDER — TRAMADOL HCL 50 MG PO TABS: 50.0000 mg | ORAL_TABLET | Freq: Four times a day (QID) | ORAL | Status: DC | PRN

## 2023-10-12 NOTE — Discharge Summary (Addendum)
Physician Discharge Summary  Chloe Wilcox QMV:[id number] DOB: March 18, 1935 DOA: 10/02/2023  PCP: Noberto Retort, MD  Admit date: 10/02/2023 Discharge date: 10/12/2023  Time spent: 40 minutes  Recommendations for Outpatient Follow-up:  Follow outpatient CBC/CMP  Follow with neurology as an outpatient for concern for her cognitive deficits as well as determining whether to continue keppra long term Needs significant assistance with PO, see SLP recs below - would have SLP continue to follow Follow outpatient with GI with regards to severe mid esophageal dysmotility Follow with cardiology outpatient for elevated troponin/demand ischemia    Discussed with neurology, no need to continue keppra, will hold at this time.  Granddaughter concerned regarding dysarthria and worsening AMS over past few days, will hold off on discharge.  Hold keppra.  Repeat MRI and follow.   Discharge Diagnoses:  Principal Problem:   Acute metabolic encephalopathy Active Problems:   Protein-calorie malnutrition, severe   Discharge Condition: stable  Diet recommendation: per SLP, dysphagia 1 thin liquids  Filed Weights   10/05/23 0500 10/06/23 0500 10/10/23 0500  Weight: 46.9 kg 49.2 kg 48.6 kg    History of present illness:   Chloe Wilcox is Chloe Wilcox 88 y.o. female with Aldair Rickel history of likely COPD, chronic respiratory failure on 3-4 L/min, depression, hypertension, hyperlipidemia, iron deficiency anemia, right hip fracture. Patient presented secondary to altered mentation in addition to seizure active with mouth foaming. Patient was admitted to ICU for Ariele Vidrio diagnosis of hypertensive emergency with concern for possible seizure activity as well. Patient required short intubation and Cleviprex. She's gradually improved.  Plan at this point is for discharge to SNF.  Will need continued follow up of her blood pressure and adjustment of hypertensive regimen.  She'll need neurology follow up for her cognitive deficit as well as  to determine whether to continue keppra long term.   Hospital Course:  Assessment and Plan:  Hypertensive emergency Associated encephalopathy and demand ischemia. Patient admitted to ICU on Cleviprex drip which was quickly weaned off. -irbesartan (substituted for home olmesartan) -will discharge home on olmesartan    Hypertensive encephalopathy Initial concern. Patient managed shortly with Celviprex drip. Improved, continue to monitor Will discharge on olmesartan, continue to follow and adjust as needed outpatient  Delirium precautions   Seizure Like Activity MRI without acute abnormality, EEG suggestive of severe diffuse encephalopathy - no seizures or definite epileptiform discharges She was started on keppra on 11/5, transitioned to PO 11/8 Will continue for now, would reevaluate with neurology outpatient   Cognitive Deficit Needs outpatient evaluation for dementia   Acute on chronic respiratory failure with hypoxia COPD Patient was intubated on admission, 11/4 and extubated on 11/5. -Continue oxygen supplementation -> wean as able (apparently wears 3-4 L at home) - CXR 11/5 without acute abnormality   Fever Resolved, last fever 11/6   E. Coli UTI With associated confusion and fever, will treat empirically -Urine culture pending -s/p Ceftriaxone  g IV x3 days.  Will transition to duricef at discharge.   Demand ischemia Elevated troponin without chest pain. In setting of hypertensive emergency. EKG without acute ischemic changes. Echo without regional [facility] Outpatient cardiology follow up  Lipid panel (LDL 43), A1c 4.8 Aspirin, lipitor    Hypokalemia resolved   COPD -Continue Brovana, Pulmicort and Yupelri   Dysphagia Barium swallow identifies severe mid-esophageal dysmotility.  Previous hospitalist discussed with  GI, Dr. Tomasa Rand, with recommendation for no upper endoscopy secondary to high risk and anesthesia need. Option for patient to follow-up  outpatient, but after discussion with granddaughter, will likely defer outpatient follow-up. Speech therapy recommendations (11/8): Diet recommendations: Thin liquid;Dysphagia 1 (puree) Liquids provided via: Straw;Cup Medication Administration: Crushed with puree Supervision: Staff to assist with self feeding;Full supervision/cueing for compensatory strategies;Trained caregiver to feed patient Compensations: Minimize environmental distractions;Slow rate;Small sips/bites Postural Changes and/or Swallow Maneuvers: Seated upright 90 degrees   Severe protein malnutrition -Dietitian consult   Chronic anemia Stable. No evidence of hemorrhaging.   History of bilateral cerebellar infarcts -Continue aspirin, statin   Depression -Continue Celexa   Pressure injury Coccyx, present on admission. Continue offloading, frequent turns outpatient    Pressure Injury 10/04/23 Coccyx Stage 2 -  Partial thickness loss of dermis presenting as Chloe Wilcox shallow open injury with Chloe Wilcox red, pink wound bed without slough. (Active)  10/04/23 1900  Location: Coccyx  Location Orientation:   Staging: Stage 2 -  Partial thickness loss of dermis presenting as Chloe Wilcox shallow open injury with Chloe Wilcox red, pink wound bed without slough.  Wound Description (Comments):   Present on Admission: Yes   Severe Protein Calorie Malnutrition Body mass index is 19.6 kg/m. Appreciate RD assistance     Procedures: Echo   IMPRESSIONS     1. Left ventricular ejection fraction, by estimation, is 70 to 75%. The  left ventricle has hyperdynamic function. The left ventricle has no  regional wall motion abnormalities. There is mild left ventricular  hypertrophy. Left ventricular diastolic  parameters are indeterminate.   2. Right ventricular systolic function is normal. The right ventricular  size is normal. There is mildly elevated pulmonary artery systolic  pressure. The estimated right ventricular systolic pressure is 38.2 mmHg.   3.  The mitral valve is grossly normal. Mild mitral valve regurgitation.  No evidence of mitral stenosis.   4. The aortic valve is tricuspid. There is mild calcification of the  aortic valve. Aortic valve regurgitation is not visualized. Aortic valve  sclerosis is present, with no evidence of aortic valve stenosis.   5. The inferior vena cava is normal in size with <50% respiratory  variability, suggesting right atrial pressure of 8 mmHg.    Consultations: PCCM  Discharge Exam: Vitals:   10/12/23 0758 10/12/23 1108  BP: (!) 173/60 (!) 119/58  Pulse: 61   Resp: 17 18  Temp: (!) 97.5 F (36.4 C)   SpO2: 100%    No complaints Moaning, but when asked if she needs anything or if anything bothering her, she calmly says no Called daughter to discuss discharge plan  General: chronically ill appearing, no acute distress Cardiovascular: RRR Lungs: unlabored Neurological: Alert - seems confused, moaning unintelligible speech occasionally when I'm outside room - when I enter, she responds appropriately, denies any pain or discomfort or needs.  Moves all extremities 4. Cranial nerves II through XII grossly intact. Extremities: No clubbing or cyanosis. No edema.  Discharge Instructions   Discharge Instructions     Ambulatory referral to Neurology   Complete by: As directed    An appointment is requested in approximately: 4 weeks   Call MD for:  difficulty breathing, headache or visual disturbances   Complete by: As directed    Call MD for:  extreme fatigue   Complete by: As directed    Call MD for:  hives   Complete by: As directed    Call MD for:  persistant dizziness or light-headedness   Complete by: As directed    Call MD for:  persistant nausea and vomiting   Complete by:  As directed    Call MD for:  redness, tenderness, or signs of infection (pain, swelling, redness, odor or green/yellow discharge around incision site)   Complete by: As directed    Call MD for:  severe  uncontrolled pain   Complete by: As directed    Call MD for:  temperature >100.4   Complete by: As directed    Diet - low sodium heart healthy   Complete by: As directed    Discharge instructions   Complete by: As directed    You were seen for confusion related to high blood pressure (hypertensive encephalopathy).  You required admission to the ICU and have gradually improved.  We also treated Chloe Wilcox UTI and will discharge you on antibiotics to complete Chloe Wilcox course.  You continue to have some confusion on the day of discharge (delirium), I think it's likely there's an underlying cognitive deficit and possibly dementia.  We'll refer you to neurology for further evaluation outpatient.  We'll continue keppra for you at discharge.  Would follow up with neurology outpatient to determine whether to continue this long term or if this can be discontinued.   You'll need to continue to follow outpatient for your swallowing issues.  You can consider GI follow up outpatient and possible endoscopy.  Speech therapy should continue to follow you.  You'll need Conlin Brahm dysphagia 1 diet with thin liquids.   Return for new, recurrent, or worsening symptoms.  Please ask your PCP to request records from this hospitalization so they know what was done and what the next steps will be.   Discharge wound care:   Complete by: As directed    Continue offloading, frequent turns, foam dressing   Increase activity slowly   Complete by: As directed       Allergies as of 10/12/2023   No Known Allergies      Medication List     STOP taking these medications    mirtazapine 7.5 MG tablet Commonly known as: REMERON       TAKE these medications    acetaminophen 325 MG tablet Commonly known as: TYLENOL Take 650 mg by mouth in the morning, at noon, and at bedtime.   albuterol (2.5 MG/3ML) 0.083% nebulizer solution Commonly known as: PROVENTIL Take 3 mLs (2.5 mg total) by nebulization every 6 (six) hours as needed  for wheezing or shortness of breath.   albuterol 108 (90 Base) MCG/ACT inhaler Commonly known as: VENTOLIN HFA Inhale 2 puffs into the lungs every 6 (six) hours as needed for wheezing or shortness of breath.   aspirin EC 81 MG tablet Take 81 mg by mouth daily. Swallow whole.   atorvastatin 40 MG tablet Commonly known as: LIPITOR Take 40 mg by mouth at bedtime.   Breztri Aerosphere 160-9-4.8 MCG/ACT Aero Generic drug: Budeson-Glycopyrrol-Formoterol Inhale 2 puffs into the lungs in the morning and at bedtime.   cefadroxil 500 MG capsule Commonly known as: DURICEF Take 1 capsule (500 mg total) by mouth 2 (two) times daily for 3 days.   Cholecalciferol 1.25 MG (50000 UT) capsule Take 50,000 Units by mouth once Chloe Wilcox week. Takes on Tuesdays   citalopram 10 MG tablet Commonly known as: CELEXA Take 1 tablet (10 mg total) by mouth daily.   docusate sodium 100 MG capsule Commonly known as: COLACE Take 1 capsule (100 mg total) by mouth 2 (two) times daily.   feeding supplement Liqd Take 237 mLs by mouth 3 (three) times daily between meals.   ipratropium-albuterol 0.5-2.5 (3)  MG/3ML Soln Commonly known as: DUONEB Inhale 3 mLs into the lungs in the morning and at bedtime.   levETIRAcetam 500 MG tablet Commonly known as: KEPPRA Take 1 tablet (500 mg total) by mouth 2 (two) times daily.   olmesartan 20 MG tablet Commonly known as: BENICAR Take 1 tablet (20 mg total) by mouth daily. What changed:  how much to take when to take this   thiamine 100 MG tablet Commonly known as: Vitamin B-1 Take 1 tablet (100 mg total) by mouth daily.   traMADol 50 MG tablet Commonly known as: ULTRAM Take 1 tablet (50 mg total) by mouth every 6 (six) hours as needed for severe pain (pain score 7-10). What changed: reasons to take this               Discharge Care Instructions  (From admission, onward)           Start     Ordered   10/12/23 0000  Discharge wound care:        Comments: Continue offloading, frequent turns, foam dressing   10/12/23 1144           No Known Allergies    The results of significant diagnostics from this hospitalization (including imaging, microbiology, ancillary and laboratory) are listed below for reference.    Significant Diagnostic Studies: DG Lumbar Spine 2-3 Views  Result Date: 10/09/2023 CLINICAL DATA:  Fall with back pain EXAM: LUMBAR SPINE - 2-3 VIEW COMPARISON:  Radiograph 10/03/2023, lumbar radiograph 08/03/2020 FINDINGS: Limited by the presence of enteral contrast and large calcified fibroid in the pelvis. Levoscoliosis. L4 and L5 vertebral bodies are obscured by enteral contrast. Advanced degenerative changes at L3-L4 with mild disc space narrowing at the remaining lumbar levels. Imaged vertebral bodies demonstrate grossly normal stature. IMPRESSION: L4 on L5 are obscured by contrast. No acute osseous abnormality allowing for this. Multilevel degenerative changes. Electronically Signed   By: Jasmine Pang M.D.   On: 10/09/2023 20:52   DG Thoracic Spine 2 View  Result Date: 10/09/2023 CLINICAL DATA:  Fall EXAM: THORACIC SPINE 2 VIEWS COMPARISON:  CT 09/23/2020 FINDINGS: Scoliosis. Vertebral body heights are maintained. Multilevel degenerative osteophytes. IMPRESSION: Scoliosis and degenerative change. Electronically Signed   By: Jasmine Pang M.D.   On: 10/09/2023 20:48   DG ESOPHAGUS W SINGLE CM (SOL OR THIN BA)  Result Date: 10/09/2023 CLINICAL DATA:  Dysphagia. Recommendation for formal barium esophagram following modified barium evaluation with speech pathology. EXAM: ESOPHAGUS/BARIUM SWALLOW/TABLET STUDY TECHNIQUE: Single contrast examination was performed using thin liquid barium. Procedure performed by Brayton El PA-C and supervised by Dr. Marliss Coots Study is somewhat limited given patient's inability to stand or lay prone. FLUOROSCOPY: Radiation Exposure Index (as provided by the fluoroscopic device): 16.70  mGy Kerma COMPARISON:  None Available. FINDINGS: Swallowing: Appears normal. No vestibular penetration or aspiration seen. Pharynx: Unremarkable. Esophagus: No definitive mucosal lesions. Both tertiary and absent contractions resulting contrast stasis and pooling within the mid esophagus. Hiatal Hernia: None. Gastroesophageal reflux: None visualized. Ingested 13 mm barium tablet: Became lodged in the proximal lower esophagus, never reaching the GE junction. Despite additional swallows of thin barium and water, tablet would not move beyond this point. IMPRESSION: Limited study due to patient age and immobility. No definitive mucosal lesions or strictures. Findings consistent with significant dysmotility resulting in contrast stasis and pooling in the mid esophagus. Tablet would not pass beyond the lower esophagus. Electronically Signed   By: Marliss Coots M.D.   On: 10/09/2023  14:27   DG Fluoro Rm 1-60 Min  Result Date: 10/04/2023 CLINICAL DATA:  88 year old admitted with confusion. Barium swallow requested. EXAM: DG C-ARM 1-60 MIN CONTRAST:  None FLUOROSCOPY: Fluoroscopy Time: 0.1 mGy COMPARISON:  None Available. FINDINGS: Scout imaging obtained in anticipation of esophagram. Unfortunately, patient non-participatory in exam due to confusion. IMPRESSION: Patient received scout imaging for barium swallow however ultimately procedure terminated due to confusion and patient's inability to participate. Electronically Signed   By: Leanna Battles M.D.   On: 10/04/2023 15:16   ECHOCARDIOGRAM COMPLETE  Result Date: 10/03/2023    ECHOCARDIOGRAM REPORT   Patient Name:   Chloe Wilcox Date of Exam: 10/03/2023 Medical Rec #:  [id number]      Height:       62.0 in Accession #:    [id number]     Weight:       99.0 lb Date of Birth:  [birth year]       BSA:          1.418 m Patient Age:    88 years       BP:           133/68 mmHg Patient Gender: F              HR:           93 bpm. Exam Location:  Inpatient Procedure: 2D Echo,  Cardiac Doppler and Color Doppler Indications:     Acutre respiratory distress R06.03  History:         Patient has prior history of Echocardiogram examinations, most                  recent 06/15/2019. COPD; Risk Factors:Hypertension.  Sonographer:     Darlys Gales Referring Phys:  [id number] Antionette Poles BABCOCK Diagnosing Phys: Lennie Odor MD IMPRESSIONS  1. Left ventricular ejection fraction, by estimation, is 70 to 75%. The left ventricle has hyperdynamic function. The left ventricle has no regional wall motion abnormalities. There is mild left ventricular hypertrophy. Left ventricular diastolic parameters are indeterminate.  2. Right ventricular systolic function is normal. The right ventricular size is normal. There is mildly elevated pulmonary artery systolic pressure. The estimated right ventricular systolic pressure is 38.2 mmHg.  3. The mitral valve is grossly normal. Mild mitral valve regurgitation. No evidence of mitral stenosis.  4. The aortic valve is tricuspid. There is mild calcification of the aortic valve. Aortic valve regurgitation is not visualized. Aortic valve sclerosis is present, with no evidence of aortic valve stenosis.  5. The inferior vena cava is normal in size with <50% respiratory variability, suggesting right atrial pressure of 8 mmHg. FINDINGS  Left Ventricle: Left ventricular ejection fraction, by estimation, is 70 to 75%. The left ventricle has hyperdynamic function. The left ventricle has no regional wall motion abnormalities. The left ventricular internal cavity size was small. There is mild left ventricular hypertrophy. Left ventricular diastolic parameters are indeterminate. Right Ventricle: The right ventricular size is normal. No increase in right ventricular wall thickness. Right ventricular systolic function is normal. There is mildly elevated pulmonary artery systolic pressure. The tricuspid regurgitant velocity is 2.75  m/s, and with an assumed right atrial pressure of 8 mmHg, the  estimated right ventricular systolic pressure is 38.2 mmHg. Left Atrium: Left atrial size was normal in size. Right Atrium: Right atrial size was normal in size. Pericardium: There is no evidence of pericardial effusion. Mitral Valve: The mitral valve is grossly normal. Mild mitral valve  regurgitation. No evidence of mitral valve stenosis. Tricuspid Valve: The tricuspid valve is grossly normal. Tricuspid valve regurgitation is mild . No evidence of tricuspid stenosis. Aortic Valve: The aortic valve is tricuspid. There is mild calcification of the aortic valve. Aortic valve regurgitation is not visualized. Aortic valve sclerosis is present, with no evidence of aortic valve stenosis. Aortic valve mean gradient measures 5.0 mmHg. Aortic valve peak gradient measures 7.5 mmHg. Aortic valve area, by VTI measures 2.73 cm. Pulmonic Valve: The pulmonic valve was not well visualized. Pulmonic valve regurgitation is not visualized. Aorta: The aortic root is normal in size and structure. Venous: The inferior vena cava is normal in size with less than 50% respiratory variability, suggesting right atrial pressure of 8 mmHg. IAS/Shunts: The atrial septum is grossly normal.  LEFT VENTRICLE PLAX 2D LVIDd:         3.10 cm   Diastology LVIDs:         1.80 cm   LV e' medial:    6.42 cm/s LV PW:         1.20 cm   LV E/e' medial:  9.5 LV IVS:        1.00 cm   LV e' lateral:   7.29 cm/s LVOT diam:     1.80 cm   LV E/e' lateral: 8.3 LV SV:         62 LV SV Index:   44 LVOT Area:     2.54 cm  RIGHT VENTRICLE RV S prime:     11.50 cm/s TAPSE (M-mode): 2.5 cm LEFT ATRIUM             Index        RIGHT ATRIUM           Index LA Vol (A2C):   35.6 ml 25.11 ml/m  RA Area:     10.20 cm LA Vol (A4C):   30.7 ml 21.65 ml/m  RA Volume:   20.00 ml  14.11 ml/m LA Biplane Vol: 35.7 ml 25.18 ml/m  AORTIC VALVE AV Area (Vmax):    2.43 cm AV Area (Vmean):   2.38 cm AV Area (VTI):     2.73 cm AV Vmax:           137.00 cm/s AV Vmean:           101.000 cm/s AV VTI:            0.228 m AV Peak Grad:      7.5 mmHg AV Mean Grad:      5.0 mmHg LVOT Vmax:         131.00 cm/s LVOT Vmean:        94.500 cm/s LVOT VTI:          0.245 m LVOT/AV VTI ratio: 1.07  AORTA Ao Root diam: 2.40 cm MITRAL VALVE               TRICUSPID VALVE MV Area (PHT): 2.10 cm    TR Peak grad:   30.2 mmHg MV Decel Time: 362 msec    TR Vmax:        275.00 cm/s MV E velocity: 60.80 cm/s MV Damari Hiltz velocity: 94.30 cm/s  SHUNTS MV E/Willis Kuipers ratio:  0.64        Systemic VTI:  0.24 m                            Systemic Diam: 1.80 cm Lennie Odor MD  Electronically signed by Lennie Odor MD Signature Date/Time: 10/03/2023/3:15:36 PM    Final (Updated)    EEG adult  Result Date: 10/03/2023 Charlsie Quest, MD     10/03/2023  8:42 AM Patient Name: Chloe Wilcox MRN: [id number] Epilepsy Attending: Charlsie Quest Referring Physician/Provider: Simonne Martinet, NP Date: 10/03/2023 Duration: 24.39 mins Patient history: 88 year old female w/ extensive medical hx as outlined below, currently resides at [facility] since her hip fracture in Aug 2024. Per Hx was talking w/ family and suddenly started seizing and foaming at mouth. On EMS arrival was unresponsive, had some facial twitching but before they could medicate for possible seizure she became apneic. EEG to evaluate for seizure Level of alertness: comatose/ lethargic AEDs during EEG study: LEV Technical aspects: This EEG study was done with scalp electrodes positioned according to the 10-20 International system of electrode placement. Electrical activity was reviewed with band pass filter of 1-70Hz , sensitivity of 7 uV/mm, display speed of 5mm/sec with Marcellino Fidalgo 60Hz  notched filter applied as appropriate. EEG data were recorded continuously and digitally stored.  Video monitoring was available and reviewed as appropriate. Description: EEG showed continuous generalized 3 to 5 Hz theta-delta slowing, at times with triphasic morphology admixed with 15 to 18 Hz  beta activity distributed symmetrically and diffusely.  Sharp transients were noted in left frontal region. Hyperventilation and photic stimulation were not performed.   ABNORMALITY - Continuous slow, generalized IMPRESSION: This study is suggestive of severe diffuse encephalopathy, likely secondary to toxin-metabolic causes. No seizures or definite epileptiform discharges were seen throughout the recording. Please note lack of epileptiform activity during interictal EEG does not exclude the diagnosis of epilepsy. Charlsie Quest   DG Chest Port 1 View  Result Date: 10/03/2023 CLINICAL DATA:  Respiratory failure. Ventilator dependence. Evaluate for pneumonia. EXAM: PORTABLE CHEST 1 VIEW COMPARISON:  10/02/2023 FINDINGS: Endotracheal tube tip is approximately 1.5 cm above the base of the carina. The lungs are clear without focal pneumonia, edema, pneumothorax or pleural effusion. Skin fold overlies the upper right lung. The cardiopericardial silhouette is within normal limits for size. Bones are diffusely demineralized. Telemetry leads overlie the chest. IMPRESSION: 1. Endotracheal tube tip is approximately 1.5 cm above the base of the carina. 2. No acute cardiopulmonary findings. Electronically Signed   By: Kennith Center M.D.   On: 10/03/2023 06:58   MR BRAIN WO CONTRAST  Result Date: 10/03/2023 CLINICAL DATA:  Hypertensive emergency EXAM: MRI HEAD WITHOUT CONTRAST TECHNIQUE: Multiplanar, multiecho pulse sequences of the brain and surrounding structures were obtained without intravenous contrast. COMPARISON:  None Available. FINDINGS: Brain: No acute infarct, mass effect or extra-axial collection. Trace, old subarachnoid blood products over both convexities. 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 There is multifocal hyperintense T2-weighted signal within the white matter. Parenchymal volume and CSF spaces are normal. The midline structures are normal. Vascular: Normal flow voids. Skull and upper  cervical spine: At least moderate upper cervical spinal canal stenosis. Sinuses/Orbits:No paranasal sinus fluid levels or advanced mucosal thickening. No mastoid or middle ear effusion. Normal orbits. IMPRESSION: 1. No acute intracranial abnormality. 2. 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 3. At least moderate upper cervical spinal canal stenosis. Electronically Signed   By: Deatra Robinson M.D.   On: 10/03/2023 03:56   DG Abd 1 View  Result Date: 10/03/2023 CLINICAL DATA:  MRI screening EXAM: ABDOMEN - 1 VIEW COMPARISON:  CT abdomen/pelvis dated 08/02/2017 FINDINGS: Large calcified fibroid overlying  the pelvis. SP tori contrast in the bilateral renal collecting systems. Status post ORIF of the right hip, incompletely visualized. This does not preclude MRI. Mild degenerative changes of the left hip. IMPRESSION: Status post ORIF of the right hip, incompletely visualized. This does not preclude MRI. Large calcified fibroid overlying the pelvis. Electronically Signed   By: Charline Bills M.D.   On: 10/03/2023 02:39   CT ANGIO HEAD NECK W WO CM  Result Date: 10/02/2023 CLINICAL DATA:  Seizure EXAM: CT ANGIOGRAPHY HEAD AND NECK WITH AND WITHOUT CONTRAST TECHNIQUE: Multidetector CT imaging of the head and neck was performed using the standard protocol during bolus administration of intravenous contrast. Multiplanar CT image reconstructions and MIPs were obtained to evaluate the vascular anatomy. Carotid stenosis measurements (when applicable) are obtained utilizing NASCET criteria, using the distal internal carotid diameter as the denominator. RADIATION DOSE REDUCTION: This exam was performed according to the departmental dose-optimization program which includes automated exposure control, adjustment of the mA and/or kV according to patient size and/or use of iterative reconstruction technique. CONTRAST:  75mL OMNIPAQUE IOHEXOL 350 MG/ML SOLN COMPARISON:  None  Available. FINDINGS: CTA NECK FINDINGS SKELETON: No acute abnormality or high grade bony spinal canal stenosis. OTHER NECK: Endotracheal tube with tip below the field of view. Fluid throughout the upper airway. UPPER CHEST: Biapical emphysema. AORTIC ARCH: There is calcific atherosclerosis of the aortic arch. Conventional 3 vessel aortic branching pattern. RIGHT CAROTID SYSTEM: No dissection, occlusion or aneurysm. Mild atherosclerotic calcification at the carotid bifurcation without hemodynamically significant stenosis. LEFT CAROTID SYSTEM: No dissection, occlusion or aneurysm. Mild atherosclerotic calcification at the carotid bifurcation without hemodynamically significant stenosis. VERTEBRAL ARTERIES: Right dominant configuration. There is no dissection, occlusion or flow-limiting stenosis to the skull base (V1-V3 segments). CTA HEAD FINDINGS POSTERIOR CIRCULATION: --Vertebral arteries: Normal V4 segments. --Inferior cerebellar arteries: Normal. --Basilar artery: Normal. --Superior cerebellar arteries: Normal. --Posterior cerebral arteries (PCA): Normal. ANTERIOR CIRCULATION: --Intracranial internal carotid arteries: Normal. --Anterior cerebral arteries (ACA): Normal. --Middle cerebral arteries (MCA): Normal. VENOUS SINUSES: As permitted by contrast timing, patent. ANATOMIC VARIANTS: None Review of the MIP images confirms the above findings. IMPRESSION: 1. No emergent large vessel occlusion or hemodynamically significant stenosis of the head or neck. 2. Mild bilateral carotid bifurcation atherosclerosis without hemodynamically significant stenosis. Aortic Atherosclerosis (ICD10-I70.0) and Emphysema (ICD10-J43.9). Electronically Signed   By: Deatra Robinson M.D.   On: 10/02/2023 22:09   CT Head Wo Contrast  Result Date: 10/02/2023 CLINICAL DATA:  Seizure, new onset, no history of trauma. EXAM: CT HEAD WITHOUT CONTRAST TECHNIQUE: Contiguous axial images were obtained from the base of the skull through the vertex  without intravenous contrast. RADIATION DOSE REDUCTION: This exam was performed according to the departmental dose-optimization program which includes automated exposure control, adjustment of the mA and/or kV according to patient size and/or use of iterative reconstruction technique. COMPARISON:  07/18/2023. FINDINGS: Brain: No acute intracranial hemorrhage, midline shift or mass effect. No extra-axial fluid collection is seen. Mild diffuse atrophy is noted. Extensive subcortical and periventricular white matter hypodensities are present bilaterally. No hydrocephalus. An old lacunar infarct is present in the right cerebellar hemisphere. Vascular: No hyperdense vessel or unexpected calcification. Skull: Normal. Negative for fracture or focal lesion. Sinuses/Orbits: There is partial opacification of the ethmoid air cells. No acute orbital abnormality. Other: None. IMPRESSION: 1. No acute intracranial process. 2. Atrophy with chronic microvascular ischemic changes. Electronically Signed   By: Thornell Sartorius M.D.   On: 10/02/2023 21:49   DG Chest Portable  1 View  Result Date: 10/02/2023 CLINICAL DATA:  Intubation EXAM: PORTABLE CHEST 1 VIEW COMPARISON:  07/10/2023 FINDINGS: Endotracheal tube is in place with the tip 4 cm above the carina. Heart mediastinal contours within normal limits. No confluent opacities or effusions. No acute bony abnormality. IMPRESSION: ET tube 4 cm above the carina.  No active disease. Electronically Signed   By: Charlett Nose M.D.   On: 10/02/2023 21:20    Microbiology: Recent Results (from the past 240 hour(s))  Culture, Respiratory w Gram Stain (tracheal aspirate)     Status: None   Collection Time: 10/02/23 11:34 PM   Specimen: Tracheal Aspirate; Respiratory  Result Value Ref Range Status   Specimen Description TRACHEAL ASPIRATE  Final   Special Requests NONE  Final   Gram Stain   Final    FEW WBC PRESENT, PREDOMINANTLY PMN FEW GRAM POSITIVE COCCI    Culture   Final     Normal respiratory flora-no Staph aureus or Pseudomonas seen Performed at [facility], [address] [place], [place] [address]    Report Status 10/05/2023 FINAL  Final  MRSA Next Gen by PCR, Nasal     Status: None   Collection Time: 10/02/23 11:34 PM   Specimen: Nasal Mucosa; Nasal Swab  Result Value Ref Range Status   MRSA by PCR Next Gen NOT DETECTED NOT DETECTED Final    Comment: (NOTE) The GeneXpert MRSA Assay (FDA approved for NASAL specimens only), is one component of Antoino Westhoff comprehensive MRSA colonization surveillance program. It is not intended to diagnose MRSA infection nor to guide or monitor treatment for MRSA infections. Test performance is not FDA approved in patients less than 49 years old. Performed at [facility], [address] [place], [place] [address]   Urine Culture (for pregnant, neutropenic or urologic patients or patients with an indwelling urinary catheter)     Status: Abnormal   Collection Time: 10/07/23  1:08 PM   Specimen: Urine, Clean Catch  Result Value Ref Range Status   Specimen Description URINE, CLEAN CATCH  Final   Special Requests   Final    NONE Performed at [facility], [address], [place], [place] [address]    Culture >=100,000 COLONIES/mL ESCHERICHIA COLI (Geovanna Simko)  Final   Report Status 10/10/2023 FINAL  Final   Organism ID, Bacteria ESCHERICHIA COLI (Kurtis Anastasia)  Final      Susceptibility   Escherichia coli - MIC*    AMPICILLIN 8 SENSITIVE Sensitive     CEFAZOLIN <=4 SENSITIVE Sensitive     CEFEPIME <=0.12 SENSITIVE Sensitive     CEFTRIAXONE <=0.25 SENSITIVE Sensitive     CIPROFLOXACIN <=0.25 SENSITIVE Sensitive     GENTAMICIN <=1 SENSITIVE Sensitive     IMIPENEM <=0.25 SENSITIVE Sensitive     NITROFURANTOIN <=16 SENSITIVE Sensitive     TRIMETH/SULFA <=20 SENSITIVE Sensitive     AMPICILLIN/SULBACTAM <=2 SENSITIVE Sensitive     PIP/TAZO <=4 SENSITIVE Sensitive ug/mL    * >=100,000 COLONIES/mL ESCHERICHIA COLI      Labs: Basic Metabolic Panel: Recent Labs  Lab 10/07/23 0438 10/09/23 1001 10/12/23 0737  NA 141  --  138  K 3.3* 4.1 4.6  CL 103  --  98  CO2 31  --  30  GLUCOSE 92  --  79  BUN 14  --  13  CREATININE 0.42*  --  0.64  CALCIUM 9.8  --  9.2  MG  --   --  2.0  PHOS  --   --  4.0   Liver Function Tests: No results for input(s): "AST", "ALT", "ALKPHOS", "BILITOT", "PROT", "ALBUMIN" in the last 168 hours. No results for input(s): "LIPASE", "AMYLASE" in the last 168 hours. No results for input(s): "AMMONIA" in the last 168 hours. CBC: No results for input(s): "WBC", "NEUTROABS", "HGB", "HCT", "MCV", "PLT" in the last 168 hours. Cardiac Enzymes: No results for input(s): "CKTOTAL", "CKMB", "CKMBINDEX", "TROPONINI" in the last 168 hours. BNP: BNP (last 3 results) No results for input(s): "BNP" in the last 8760 hours.  ProBNP (last 3 results) No results for input(s): "PROBNP" in the last 8760 hours.  CBG: Recent Labs  Lab 10/11/23 1058 10/11/23 1605 10/11/23 2119 10/12/23 0621 10/12/23 1108  GLUCAP 105* 101* 112* 70 95       Signed:  Lacretia Nicks MD.  [facility] 10/12/2023, 11:44 AM

## 2023-10-12 NOTE — Plan of Care (Signed)
Problem: Education: Goal: Ability to describe self-care measures that may prevent or decrease complications (Diabetes Survival Skills Education) will improve Outcome: Not Progressing   Problem: Fluid Volume: Goal: Ability to maintain a balanced intake and output will improve Outcome: Progressing   Problem: Health Behavior/Discharge Planning: Goal: Ability to identify and utilize available resources and services will improve Outcome: Not Progressing Goal: Ability to manage health-related needs will improve Outcome: Not Progressing   Problem: Metabolic: Goal: Ability to maintain appropriate glucose levels will improve Outcome: Progressing   Problem: Nutritional: Goal: Maintenance of adequate nutrition will improve Outcome: Progressing Goal: Progress toward achieving an optimal weight will improve Outcome: Progressing   Problem: Skin Integrity: Goal: Risk for impaired skin integrity will decrease Outcome: Progressing   Problem: Tissue Perfusion: Goal: Adequacy of tissue perfusion will improve Outcome: Progressing   Problem: Education: Goal: Knowledge of General Education information will improve Description: Including pain rating scale, medication(s)/side effects and non-pharmacologic comfort measures Outcome: Not Progressing   Problem: Health Behavior/Discharge Planning: Goal: Ability to manage health-related needs will improve Outcome: Not Progressing   Problem: Clinical Measurements: Goal: Ability to maintain clinical measurements within normal limits will improve Outcome: Progressing Goal: Will remain free from infection Outcome: Progressing Goal: Diagnostic test results will improve Outcome: Progressing Goal: Respiratory complications will improve Outcome: Progressing Goal: Cardiovascular complication will be avoided Outcome: Progressing   Problem: Activity: Goal: Risk for activity intolerance will decrease Outcome: Not Progressing   Problem:  Nutrition: Goal: Adequate nutrition will be maintained Outcome: Progressing   Problem: Pain Management: Goal: General experience of comfort will improve Outcome: Progressing   Problem: Safety: Goal: Ability to remain free from injury will improve Outcome: Progressing   Problem: Skin Integrity: Goal: Risk for impaired skin integrity will decrease Outcome: Progressing

## 2023-10-12 NOTE — Progress Notes (Signed)
Physical Therapy Treatment Patient Details Name: Chloe Wilcox MRN: [id number] DOB: Nov 09, 1935 Today's Date: 10/12/2023   History of Present Illness Chloe Wilcox is a 88 y.o. female who presented with altered mentation in addition to seizure activity. Intubated 11/4-11/5. PMHx: COPD, chronic respiratory failure on 3-4 L/min, depression, hypertension, hyperlipidemia, iron deficiency anemia, right hip fracture.    PT Comments  Pt greeted resting in bed, demonstrating slow but steady progress towards acute goals this session. Pt requiring total A to initiate bed mobility, however once Les off EOB pt able to initiate trunk elevation and complete with max A. Pt with improved seated balance this session, able to maintain with min A initially fading to CGA with increased time. Pt able to elevate hips from EOB x2 attempts with max A however pt with poor weight bearing and weight shift through RLE due to increased pain. Pt positioned in chair position in bed at end of session. Current plan remains appropriate to address deficits and maximize functional independence and decrease caregiver burden. Pt continues to benefit from skilled PT services to progress toward functional mobility goals.     If plan is discharge home, recommend the following:     Can travel by private vehicle     No  Equipment Recommendations  None recommended by PT    Recommendations for Other Services       Precautions / Restrictions Precautions Precautions: Fall Restrictions Weight Bearing Restrictions: No     Mobility  Bed Mobility Overal bed mobility: Needs Assistance Bed Mobility: Supine to Sit, Sit to Supine     Supine to sit: Max assist Sit to supine: Total assist   General bed mobility comments: total A to bring LEs to and off EOB, pt assisting to elevate trunk to sitting    Transfers Overall transfer level: Needs assistance Equipment used: 1 person hand held assist Transfers: Sit to/from Stand Sit to  Stand: Max assist           General transfer comment: max A to clear hips from EOB x2, pt limited by pain, calling out with each attempt    Ambulation/Gait               General Gait Details: unable   Stairs             Wheelchair Mobility     Tilt Bed    Modified Rankin (Stroke Patients Only)       Balance Overall balance assessment: Needs assistance Sitting-balance support: Feet supported, Bilateral upper extremity supported Sitting balance-Leahy Scale: Fair Sitting balance - Comments: able to maintain without assist this session                                    Cognition Arousal: Alert Behavior During Therapy: Restless Overall Cognitive Status: History of cognitive impairments - at baseline Area of Impairment: Orientation, Memory, Safety/judgement                 Orientation Level: Disoriented to, Time   Memory: Decreased short-term memory   Safety/Judgement: Decreased awareness of safety, Decreased awareness of deficits     General Comments: following ~50% of basic cues. difficulty expressing needs, restless, confused        Exercises      General Comments General comments (skin integrity, edema, etc.): VSS      Pertinent Vitals/Pain Pain Assessment Pain Assessment: Faces Faces Pain Scale: Hurts  a little bit Pain Location: "my leg" with weight bearing Pain Descriptors / Indicators: Grimacing, Guarding, Moaning Pain Intervention(s): Monitored during session, Limited activity within patient's tolerance    Home Living                          Prior Function            PT Goals (current goals can now be found in the care plan section) Acute Rehab PT Goals PT Goal Formulation: Patient unable to participate in goal setting Time For Goal Achievement: 10/19/23 Progress towards PT goals: Progressing toward goals    Frequency    Min 1X/week      PT Plan      Co-evaluation               AM-PAC PT "6 Clicks" Mobility   Outcome Measure  Help needed turning from your back to your side while in a flat bed without using bedrails?: A Lot Help needed moving from lying on your back to sitting on the side of a flat bed without using bedrails?: A Lot Help needed moving to and from a bed to a chair (including a wheelchair)?: Total Help needed standing up from a chair using your arms (e.g., wheelchair or bedside chair)?: Total Help needed to walk in hospital room?: Total Help needed climbing 3-5 steps with a railing? : Total 6 Click Score: 8    End of Session Equipment Utilized During Treatment: Gait belt Activity Tolerance: Patient limited by pain Patient left: in bed;with call bell/phone within reach;with family/visitor present;with bed alarm set (with bed in chair position) Nurse Communication: Mobility status PT Visit Diagnosis: Unsteadiness on feet (R26.81);Muscle weakness (generalized) (M62.81);Difficulty in walking, not elsewhere classified (R26.2);Pain Pain - part of body: Knee     Time: 1413-1430 PT Time Calculation (min) (ACUTE ONLY): 17 min  Charges:    $Therapeutic Activity: 8-22 mins PT General Charges $$ ACUTE PT VISIT: 1 Visit                     Katherin Ramey R. PTA Acute Rehabilitation Services Office: [phone]   Catalina Antigua 10/12/2023, 3:20 PM

## 2023-10-13 DIAGNOSIS — G9341 Metabolic encephalopathy: Secondary | ICD-10-CM | POA: Diagnosis not present

## 2023-10-13 LAB — COMPREHENSIVE METABOLIC PANEL
ALT: 11 U/L (ref 0–44)
AST: 13 U/L — ABNORMAL LOW (ref 15–41)
Albumin: 3.2 g/dL — ABNORMAL LOW (ref 3.5–5.0)
Alkaline Phosphatase: 41 U/L (ref 38–126)
Anion gap: 7 (ref 5–15)
BUN: 17 mg/dL (ref 8–23)
CO2: 33 mmol/L — ABNORMAL HIGH (ref 22–32)
Calcium: 9.7 mg/dL (ref 8.9–10.3)
Chloride: 98 mmol/L (ref 98–111)
Creatinine, Ser: 0.64 mg/dL (ref 0.44–1.00)
GFR, Estimated: >60 mL/min (ref >60)
Glucose, Bld: 89 mg/dL (ref 70–99)
Potassium: 4.9 mmol/L (ref 3.5–5.1)
Sodium: 138 mmol/L (ref 135–145)
Total Bilirubin: 0.6 mg/dL (ref <1.2)
Total Protein: 5.3 g/dL — ABNORMAL LOW (ref 6.5–8.1)

## 2023-10-13 LAB — CBC WITH DIFFERENTIAL/PLATELET
Abs Immature Granulocytes: 0.02 10*3/uL (ref 0.00–0.07)
Basophils Absolute: 0.1 10*3/uL (ref 0.0–0.1)
Basophils Relative: 1 %
Eosinophils Absolute: 0.2 10*3/uL (ref 0.0–0.5)
Eosinophils Relative: 3 %
HCT: 31.3 % — ABNORMAL LOW (ref 36.0–46.0)
Hemoglobin: 9.3 g/dL — ABNORMAL LOW (ref 12.0–15.0)
Immature Granulocytes: 0 %
Lymphocytes Relative: 17 %
Lymphs Abs: 1 10*3/uL (ref 0.7–4.0)
MCH: 28.6 pg (ref 26.0–34.0)
MCHC: 29.7 g/dL — ABNORMAL LOW (ref 30.0–36.0)
MCV: 96.3 fL (ref 80.0–100.0)
Monocytes Absolute: 0.7 10*3/uL (ref 0.1–1.0)
Monocytes Relative: 13 %
Neutro Abs: 3.6 10*3/uL (ref 1.7–7.7)
Neutrophils Relative %: 66 %
Platelets: 315 10*3/uL (ref 150–400)
RBC: 3.25 MIL/uL — ABNORMAL LOW (ref 3.87–5.11)
RDW: 13.6 % (ref 11.5–15.5)
WBC: 5.5 10*3/uL (ref 4.0–10.5)
nRBC: 0 % (ref 0.0–0.2)

## 2023-10-13 LAB — MAGNESIUM: Magnesium: 2 mg/dL (ref 1.7–2.4)

## 2023-10-13 LAB — PHOSPHORUS: Phosphorus: 3.8 mg/dL (ref 2.5–4.6)

## 2023-10-13 LAB — GLUCOSE, CAPILLARY
Glucose-Capillary: 79 mg/dL (ref 70–99)
Glucose-Capillary: 94 mg/dL (ref 70–99)

## 2023-10-13 MED ORDER — LEVETIRACETAM 500 MG PO TABS
500.0000 mg | ORAL_TABLET | Freq: Two times a day (BID) | ORAL | Status: DC
  Administered 2023-10-13: 500 mg via ORAL
  Filled 2023-10-13: qty 1

## 2023-10-13 NOTE — NC FL2 (Signed)
MEDICAID FL2 LEVEL OF CARE FORM     IDENTIFICATION  Patient Name: Chloe Wilcox Birthdate: 22-Apr-1935 Sex: female Admission Date (Current Location): 10/02/2023  [facility] and [place] Number:  Producer, television/film/video and Address:  [facility], [address] [place], [place] [address]      Provider Number: [id number]  Attending Physician Name and Address:  Zigmund Daniel., *  Relative Name and Phone Number:       Current Level of Care: Hospital Recommended Level of Care: Skilled Nursing Facility Prior Approval Number:    Date Approved/Denied:   PASRR Number:    Discharge Plan: SNF    Current Diagnoses: Patient Active Problem List   Diagnosis Date Noted   Protein-calorie malnutrition, severe 10/05/2023   Acute metabolic encephalopathy 10/02/2023   Closed displaced intertrochanteric fracture of right femur (HCC) 07/10/2023   Fall 07/10/2023   Depression 07/10/2023   Hyperlipidemia 07/10/2023   Anemia of chronic disease 07/10/2023   History of COVID-19 09/14/2020   Abnormal findings on diagnostic imaging of lung 09/14/2020   Hypoxemia 07/23/2020   Respiratory failure (HCC) 06/15/2019   Acute respiratory failure with hypoxia (HCC) 06/14/2019   COPD (chronic obstructive pulmonary disease) (HCC) 06/14/2019   COPD with acute bronchitis (HCC) 06/14/2019   Hypokalemia 06/14/2019   Essential hypertension 06/14/2019    Orientation RESPIRATION BLADDER Height & Weight     Self  O2 ([place] 3L) Incontinent Weight: 107 lb 2.3 oz (48.6 kg) Height:  5\' 2"  (157.5 cm)  BEHAVIORAL SYMPTOMS/MOOD NEUROLOGICAL BOWEL NUTRITION STATUS      Incontinent Diet (see DC summary)  AMBULATORY STATUS COMMUNICATION OF NEEDS Skin   Extensive Assist Verbally PU Stage and Appropriate Care   PU Stage 2 Dressing: Daily (coccyx, foam dressing)                   Personal Care Assistance Level of Assistance  Bathing, Feeding, Dressing Bathing Assistance:  Maximum assistance Feeding assistance: Limited assistance Dressing Assistance: Maximum assistance     Functional Limitations Info  Sight, Hearing Sight Info: Impaired Hearing Info: Impaired      SPECIAL CARE FACTORS FREQUENCY  PT (By licensed PT), OT (By licensed OT)     PT Frequency: eval and treat OT Frequency: eval and treat            Contractures Contractures Info: Not present    Additional Factors Info  Code Status, Allergies Code Status Info: Full Allergies Info: NKA           Current Medications (10/13/2023):  This is the current hospital active medication list Current Facility-Administered Medications  Medication Dose Route Frequency Provider Last Rate Last Admin   acetaminophen (TYLENOL) tablet 650 mg  650 mg Oral Q6H PRN Narda Bonds, MD   650 mg at 10/11/23 2119   albuterol (PROVENTIL) (2.5 MG/3ML) 0.083% nebulizer solution 2.5 mg  2.5 mg Nebulization Q4H PRN Simonne Martinet, NP       arformoterol (BROVANA) nebulizer solution 15 mcg  15 mcg Nebulization BID Simonne Martinet, NP   15 mcg at 10/13/23 8119   ascorbic acid (VITAMIN C) tablet 250 mg  250 mg Oral BID Zigmund Daniel., MD   250 mg at 10/13/23 1478   aspirin chewable tablet 81 mg  81 mg Oral Daily Lorin Glass, MD   81 mg at 10/13/23 2956   Or   aspirin suppository 300 mg  300 mg  Rectal Daily Lorin Glass, MD   300 mg at 10/10/23 1610   atorvastatin (LIPITOR) tablet 40 mg  40 mg Oral QHS Lorin Glass, MD   40 mg at 10/12/23 2121   budesonide (PULMICORT) nebulizer solution 0.5 mg  0.5 mg Nebulization BID Simonne Martinet, NP   0.5 mg at 10/13/23 0746   citalopram (CELEXA) tablet 10 mg  10 mg Oral Daily Lorin Glass, MD   10 mg at 10/13/23 9604   docusate sodium (COLACE) capsule 100 mg  100 mg Oral BID PRN Simonne Martinet, NP       feeding supplement (ENSURE ENLIVE / ENSURE PLUS) liquid 237 mL  237 mL Oral TID BM Lorin Glass, MD   237 mL at 10/13/23 1248   heparin injection  5,000 Units  5,000 Units Subcutaneous Q8H Simonne Martinet, NP   5,000 Units at 10/13/23 5409   insulin aspart (novoLOG) injection 0-9 Units  0-9 Units Subcutaneous TID WC Narda Bonds, MD   2 Units at 10/10/23 1227   irbesartan (AVAPRO) tablet 150 mg  150 mg Oral Daily Narda Bonds, MD   150 mg at 10/13/23 0851   levETIRAcetam (KEPPRA) tablet 500 mg  500 mg Oral BID Zigmund Daniel., MD       multivitamin with minerals tablet 1 tablet  1 tablet Oral Daily Zigmund Daniel., MD   1 tablet at 10/13/23 8119   Oral care mouth rinse  15 mL Mouth Rinse 4 times per day Narda Bonds, MD   15 mL at 10/13/23 1478   Oral care mouth rinse  15 mL Mouth Rinse PRN Narda Bonds, MD       polyethylene glycol (MIRALAX / GLYCOLAX) packet 17 g  17 g Oral Daily PRN Simonne Martinet, NP       revefenacin (YUPELRI) nebulizer solution 175 mcg  175 mcg Nebulization Daily Simonne Martinet, NP   175 mcg at 10/13/23 0746   thiamine (VITAMIN B1) tablet 100 mg  100 mg Oral Daily Leander Rams, RPH   100 mg at 10/13/23 2956   traMADol (ULTRAM) tablet 50 mg  50 mg Oral Q6H PRN Zigmund Daniel., MD         Discharge Medications: Please see discharge summary for a list of discharge medications.  Relevant Imaging Results:  Relevant Lab Results:   Additional Information SS#: [id number]  Baldemar Lenis, LCSW

## 2023-10-13 NOTE — TOC Progression Note (Signed)
Transition of Care [facility]) - Progression Note    Patient Details  Name: Chloe Wilcox MRN: [id number] Date of Birth: [birth year]  Transition of Care [facility]) CM/SW Contact  Baldemar Lenis, [place] Phone Number: 10/13/2023, 1:53 PM  Clinical Narrative:   CSW updated Dorann Lodge and MD about insurance denial. [facility] can still admit the patient back, will return Medicaid Pending for long term care. Per MD, plan to discharge today has been canceled. CSW updated [employer], will continue to follow.    Expected Discharge Plan: Skilled Nursing Facility Barriers to Discharge: Continued Medical Work up, English as a second language teacher  Expected Discharge Plan and Services In-house Referral: Clinical Social Work   Post Acute Care Choice: Skilled Nursing Facility Living arrangements for the past 2 months: Skilled Nursing Facility Expected Discharge Date: 10/13/23                                     Social Determinants of Health (SDOH) Interventions SDOH Screenings   Food Insecurity: No Food Insecurity (10/05/2023)  Housing: Low Risk  (10/05/2023)  Transportation Needs: No Transportation Needs (10/05/2023)  Utilities: Not At Risk (10/05/2023)  Tobacco Use: Medium Risk (10/05/2023)    Readmission Risk Interventions     No data to display

## 2023-10-13 NOTE — TOC Transition Note (Signed)
Transition of Care [facility]) - CM/SW Discharge Note   Patient Details  Name: Chloe Wilcox MRN: [id number] Date of Birth: 16-Nov-1935  Transition of Care [facility]) CM/SW Contact:  Baldemar Lenis, LCSW Phone Number: 10/13/2023, 2:00 PM   Clinical Narrative:   Patient medically stable for discharge to SNF today, [facility] able to accept. CSW sent discharge information to [facility]. CSW spoke with granddaughter, [name], she would like to see patient at the hospital prior to transport. Transport arranged with [facility] for after 2:00 to ensure granddaughter can arrive in time.  Nurse to call report to [phone], Room 216.    Final next level of care: Skilled Nursing Facility Barriers to Discharge: Barriers Resolved   Patient Goals and CMS Choice      Discharge Placement                Patient chooses bed at: [facility] Patient to be transferred to facility by: [facility] Name of family member notified: [name] Patient and family notified of of transfer: 10/13/23  Discharge Plan and Services Additional resources added to the After Visit Summary for   In-house Referral: Clinical Social Work   Post Acute Care Choice: Skilled Nursing Facility                               Social Determinants of Health (SDOH) Interventions SDOH Screenings   Food Insecurity: No Food Insecurity (10/05/2023)  Housing: Low Risk  (10/05/2023)  Transportation Needs: No Transportation Needs (10/05/2023)  Utilities: Not At Risk (10/05/2023)  Tobacco Use: Medium Risk (10/05/2023)     Readmission Risk Interventions     No data to display

## 2023-10-13 NOTE — Plan of Care (Signed)
Patient discharged back to [facility]. Report called.  [name] the granddaughter called and updated. Transported via stretcher.  Patient clean and placed on 3L [place]  Problem: Education: Goal: Knowledge of General Education information will improve Description: Including pain rating scale, medication(s)/side effects and non-pharmacologic comfort measures Outcome: Completed/Met   Problem: Health Behavior/Discharge Planning: Goal: Ability to manage health-related needs will improve Outcome: Completed/Met   Problem: Clinical Measurements: Goal: Ability to maintain clinical measurements within normal limits will improve Outcome: Completed/Met Goal: Will remain free from infection Outcome: Completed/Met Goal: Diagnostic test results will improve Outcome: Completed/Met Goal: Respiratory complications will improve Outcome: Completed/Met Goal: Cardiovascular complication will be avoided Outcome: Completed/Met   Problem: Activity: Goal: Risk for activity intolerance will decrease Outcome: Completed/Met   Problem: Nutrition: Goal: Adequate nutrition will be maintained Outcome: Completed/Met   Problem: Coping: Goal: Level of anxiety will decrease Outcome: Completed/Met   Problem: Elimination: Goal: Will not experience complications related to bowel motility Outcome: Completed/Met Goal: Will not experience complications related to urinary retention Outcome: Completed/Met   Problem: Pain Management: Goal: General experience of comfort will improve Outcome: Completed/Met   Problem: Safety: Goal: Ability to remain free from injury will improve Outcome: Completed/Met   Problem: Skin Integrity: Goal: Risk for impaired skin integrity will decrease Outcome: Completed/Met   Problem: Education: Goal: Ability to describe self-care measures that may prevent or decrease complications (Diabetes Survival Skills Education) will improve Outcome: Completed/Met Goal: Individualized  Educational Video(s) Outcome: Completed/Met   Problem: Coping: Goal: Ability to adjust to condition or change in health will improve Outcome: Completed/Met   Problem: Fluid Volume: Goal: Ability to maintain a balanced intake and output will improve Outcome: Completed/Met   Problem: Health Behavior/Discharge Planning: Goal: Ability to identify and utilize available resources and services will improve Outcome: Completed/Met Goal: Ability to manage health-related needs will improve Outcome: Completed/Met   Problem: Metabolic: Goal: Ability to maintain appropriate glucose levels will improve Outcome: Completed/Met   Problem: Nutritional: Goal: Maintenance of adequate nutrition will improve Outcome: Completed/Met Goal: Progress toward achieving an optimal weight will improve Outcome: Completed/Met   Problem: Skin Integrity: Goal: Risk for impaired skin integrity will decrease Outcome: Completed/Met   Problem: Tissue Perfusion: Goal: Adequacy of tissue perfusion will improve Outcome: Completed/Met

## 2023-10-13 NOTE — Discharge Summary (Signed)
Physician Discharge Summary  NALLEY RAVEL EPP:[id number] DOB: Jul 04, 1935 DOA: 10/02/2023  PCP: Noberto Retort, MD  Admit date: 10/02/2023 Discharge date: 10/13/2023  Time spent: 40 minutes  Recommendations for Outpatient Follow-up:  Follow outpatient CBC/CMP  Follow with neurology as an outpatient for concern for her cognitive deficits as well as determining whether to continue keppra long term If concern for keppra causing side effects consider holding medication +/- change in medication Needs significant assistance with PO, see SLP recs below - would have SLP continue to follow Follow outpatient with GI with regards to severe mid esophageal dysmotility Follow with cardiology outpatient for elevated troponin/demand ischemia    Discharge Diagnoses:  Principal Problem:   Acute metabolic encephalopathy Active Problems:   Protein-calorie malnutrition, severe   Discharge Condition: stable  Diet recommendation: per SLP, dysphagia 1 thin liquids  Filed Weights   10/05/23 0500 10/06/23 0500 10/10/23 0500  Weight: 46.9 kg 49.2 kg 48.6 kg    History of present illness:   ROSS SZABO is Jaslin Novitski 88 y.o. female with Jadon Harbaugh history of likely COPD, chronic respiratory failure on 3-4 L/min, depression, hypertension, hyperlipidemia, iron deficiency anemia, right hip fracture. Patient presented secondary to altered mentation in addition to seizure active with mouth foaming. Patient was admitted to ICU for Nobel Brar diagnosis of hypertensive emergency with concern for possible seizure activity as well. Patient required short intubation and Cleviprex. She's gradually improved.  Plan at this point is for discharge to SNF.  Will need continued follow up of her blood pressure and adjustment of hypertensive regimen.  She'll need neurology follow up for her cognitive deficit as well as to determine whether to continue keppra long term.   Hospital Course:  Assessment and Plan:  Hypertensive emergency Associated  encephalopathy and demand ischemia. Patient admitted to ICU on Cleviprex drip which was quickly weaned off. -irbesartan (substituted for home olmesartan) -will discharge home on olmesartan    Hypertensive encephalopathy Initial concern. Patient managed shortly with Celviprex drip. Improved, continue to monitor Will discharge on olmesartan, continue to follow and adjust as needed outpatient  Delirium precautions   Seizure Like Activity MRI without acute abnormality, EEG suggestive of severe diffuse encephalopathy - no seizures or definite epileptiform discharges She was started on keppra on 11/5, transitioned to PO 11/8 Discussed case with neurology informally 11/14-14.  Given MRI finding with chronic foci of hemosiderin staining involving the bilateral cerebral hemispheres (could potentially act as seizure focus), will continue keppra (had briefly held on 11/14 with concern for AMS - though she's improved today and granddaughter noted she was better Jeramyah Goodpasture few days ago as well - when she was on the same keppra dose).  Follow with neurology outpatient regarding long term plan with keppra.    Cognitive Deficit  Delirium Needs outpatient evaluation for dementia  Repeat MRI 11/14 was without acute intracranial abnormality, did show moderate advanced cerebral atrophy with chronic small vessel ischemic disease with Lyndon Chenoweth few scattered small remote bilateral cerebellar infarcts.  Few small chronic foci of hemosiderin staining involving the bilateral cerebral hemispheres. I discussed case informally with neurology yesterday, we decided to hold keppra, but with MRI findings with chronic foci of hemosiderin staining involving the bilateral cerebral hemispheres (potentially seizure focus) we've decided to continue keppra for now.  Would watch for side effects, consider holding or changing if concern for side effects.   Acute on chronic respiratory failure with hypoxia COPD Patient was intubated on admission, 11/4  and extubated on 11/5. -Continue oxygen  supplementation -> wean as able (apparently wears 3-4 L at home) - CXR 11/5 without acute abnormality   Fever Resolved, last fever 11/6   E. Coli UTI With associated confusion and fever, will treat empirically -Urine culture pending -s/p Ceftriaxone  g IV x3 days.  Will transition to duricef at discharge.   Demand ischemia Elevated troponin without chest pain. In setting of hypertensive emergency. EKG without acute ischemic changes. Echo without regional [facility] follow up  Lipid panel (LDL 43), A1c 4.8 Aspirin, lipitor    Hypokalemia resolved   COPD -Continue Brovana, Pulmicort and Yupelri   Dysphagia Barium swallow identifies severe mid-esophageal dysmotility.  Previous hospitalist discussed with [facility], Dr. Tomasa Rand, with recommendation for no upper endoscopy secondary to high risk and anesthesia need. Option for patient to follow-up outpatient, but after discussion with granddaughter, will likely defer outpatient follow-up. Speech therapy recommendations (11/8): Diet recommendations: Thin liquid;Dysphagia 1 (puree) Liquids provided via: Straw;Cup Medication Administration: Crushed with puree Supervision: Staff to assist with self feeding;Full supervision/cueing for compensatory strategies;Trained caregiver to feed patient Compensations: Minimize environmental distractions;Slow rate;Small sips/bites Postural Changes and/or Swallow Maneuvers: Seated upright 90 degrees   Severe protein malnutrition -Dietitian consult   Chronic anemia Stable. No evidence of hemorrhaging.   History of bilateral cerebellar infarcts -Continue aspirin, statin   Depression -Continue Celexa   Pressure injury Coccyx, present on admission. Continue offloading, frequent turns outpatient    Pressure Injury 10/04/23 Coccyx Stage 2 -  Partial thickness loss of dermis presenting as Randall Colden shallow open injury with Cardin Nitschke red, pink wound bed  without slough. (Active)  10/04/23 1900  Location: Coccyx  Location Orientation:   Staging: Stage 2 -  Partial thickness loss of dermis presenting as Hart Haas shallow open injury with Nakyiah Kuck red, pink wound bed without slough.  Wound Description (Comments):   Present on Admission: Yes   Severe Protein Calorie Malnutrition Body mass index is 19.6 kg/m. Appreciate RD assistance     Procedures: Echo   IMPRESSIONS     1. Left ventricular ejection fraction, by estimation, is 70 to 75%. The  left ventricle has hyperdynamic function. The left ventricle has no  regional wall motion abnormalities. There is mild left ventricular  hypertrophy. Left ventricular diastolic  parameters are indeterminate.   2. Right ventricular systolic function is normal. The right ventricular  size is normal. There is mildly elevated pulmonary artery systolic  pressure. The estimated right ventricular systolic pressure is 38.2 mmHg.   3. The mitral valve is grossly normal. Mild mitral valve regurgitation.  No evidence of mitral stenosis.   4. The aortic valve is tricuspid. There is mild calcification of the  aortic valve. Aortic valve regurgitation is not visualized. Aortic valve  sclerosis is present, with no evidence of aortic valve stenosis.   5. The inferior vena cava is normal in size with <50% respiratory  variability, suggesting right atrial pressure of 8 mmHg.    Consultations: PCCM  Discharge Exam: Vitals:   10/13/23 0747 10/13/23 0801  BP:  133/73  Pulse: 70 65  Resp: 18 18  Temp:  98.2 F (36.8 C)  SpO2: 94% 100%   No complaints Excited about Atianna Haidar possible discharge  General: No acute distress. Cardiovascular: RRR Lungs: unlabored Neurological: Alert. Moves all extremities 4. Cranial nerves II through XII grossly intact. Extremities: No clubbing or cyanosis. No edema.   Discharge Instructions   Discharge Instructions     Ambulatory referral to Neurology   Complete by: As directed  An appointment is requested in approximately: 4 weeks   Call MD for:  difficulty breathing, headache or visual disturbances   Complete by: As directed    Call MD for:  extreme fatigue   Complete by: As directed    Call MD for:  hives   Complete by: As directed    Call MD for:  persistant dizziness or light-headedness   Complete by: As directed    Call MD for:  persistant nausea and vomiting   Complete by: As directed    Call MD for:  redness, tenderness, or signs of infection (pain, swelling, redness, odor or green/yellow discharge around incision site)   Complete by: As directed    Call MD for:  severe uncontrolled pain   Complete by: As directed    Call MD for:  temperature >100.4   Complete by: As directed    Diet - low sodium heart healthy   Complete by: As directed    Discharge instructions   Complete by: As directed    You were seen for confusion related to high blood pressure (hypertensive encephalopathy).  You required admission to the ICU and have gradually improved.  We also treated Mervin Ramires UTI and will discharge you on antibiotics to complete Champayne Kocian course.  You had some delirium (confusion) during the hospital stay.   We sometimes see this in patients with advanced age (with or without dementia).   It's possible you have Osinachi Navarrette previously undiagnosed cognitive deficit.  We'll refer you to neurology for further evaluation outpatient.  We'll continue keppra for you at discharge.  Would follow up with neurology outpatient to determine whether to continue this long term or if this can be discontinued. Watch for side effects like sedation or confusion.  You'll need to continue to follow outpatient for your swallowing issues.  You can consider GI follow up outpatient and possible endoscopy.  Speech therapy should continue to follow you.  You'll need Mardella Nuckles dysphagia 1 diet with thin liquids.   You should follow with cardiology outpatient for demand ischemia.   Return for new, recurrent, or  worsening symptoms.  Please ask your PCP to request records from this hospitalization so they know what was done and what the next steps will be.  Per [facility] statutes, patients with seizures are not allowed to drive until  they have been seizure-free for six months. Use caution when using heavy equipment or power tools. Avoid working on ladders or at heights. Take showers instead of baths. Ensure the water temperature is not too high on the home water heater. Do not go swimming alone. When caring for infants or small children, sit down when holding, feeding, or changing them to minimize risk of injury to the child in the event you have Alena Blankenbeckler seizure. Also, Maintain good sleep hygiene. Avoid alcohol.   Discharge wound care:   Complete by: As directed    Continue offloading, frequent turns, foam dressing   Increase activity slowly   Complete by: As directed       Allergies as of 10/13/2023   No Known Allergies      Medication List     STOP taking these medications    mirtazapine 7.5 MG tablet Commonly known as: REMERON       TAKE these medications    acetaminophen 325 MG tablet Commonly known as: TYLENOL Take 650 mg by mouth in the morning, at noon, and at bedtime.   albuterol (2.5 MG/3ML) 0.083% nebulizer solution Commonly known  as: PROVENTIL Take 3 mLs (2.5 mg total) by nebulization every 6 (six) hours as needed for wheezing or shortness of breath.   albuterol 108 (90 Base) MCG/ACT inhaler Commonly known as: VENTOLIN HFA Inhale 2 puffs into the lungs every 6 (six) hours as needed for wheezing or shortness of breath.   aspirin EC 81 MG tablet Take 81 mg by mouth daily. Swallow whole.   atorvastatin 40 MG tablet Commonly known as: LIPITOR Take 40 mg by mouth at bedtime.   Breztri Aerosphere 160-9-4.8 MCG/ACT Aero Generic drug: Budeson-Glycopyrrol-Formoterol Inhale 2 puffs into the lungs in the morning and at bedtime.   cefadroxil 500 MG capsule Commonly  known as: DURICEF Take 1 capsule (500 mg total) by mouth 2 (two) times daily for 3 days.   Cholecalciferol 1.25 MG (50000 UT) capsule Take 50,000 Units by mouth once Idelle Reimann week. Takes on Tuesdays   citalopram 10 MG tablet Commonly known as: CELEXA Take 1 tablet (10 mg total) by mouth daily.   docusate sodium 100 MG capsule Commonly known as: COLACE Take 1 capsule (100 mg total) by mouth 2 (two) times daily.   feeding supplement Liqd Take 237 mLs by mouth 3 (three) times daily between meals.   ipratropium-albuterol 0.5-2.5 (3) MG/3ML Soln Commonly known as: DUONEB Inhale 3 mLs into the lungs in the morning and at bedtime.   levETIRAcetam 500 MG tablet Commonly known as: KEPPRA Take 1 tablet (500 mg total) by mouth 2 (two) times daily.   olmesartan 20 MG tablet Commonly known as: BENICAR Take 1 tablet (20 mg total) by mouth daily. What changed:  how much to take when to take this   thiamine 100 MG tablet Commonly known as: Vitamin B-1 Take 1 tablet (100 mg total) by mouth daily.   traMADol 50 MG tablet Commonly known as: ULTRAM Take 1 tablet (50 mg total) by mouth every 6 (six) hours as needed for severe pain (pain score 7-10). What changed: reasons to take this               Discharge Care Instructions  (From admission, onward)           Start     Ordered   10/12/23 0000  Discharge wound care:       Comments: Continue offloading, frequent turns, foam dressing   10/12/23 1144           No Known Allergies    The results of significant diagnostics from this hospitalization (including imaging, microbiology, ancillary and laboratory) are listed below for reference.    Significant Diagnostic Studies: MR BRAIN WO CONTRAST  Result Date: 10/12/2023 CLINICAL DATA:  Initial evaluation for neuro deficit, stroke suspected. Seizure. EXAM: MRI HEAD WITHOUT CONTRAST TECHNIQUE: Multiplanar, multiecho pulse sequences of the brain and surrounding structures were  obtained without intravenous contrast. COMPARISON:  Prior study from 10/03/2023. FINDINGS: Brain: Moderately advanced cerebral atrophy. Patchy T2/FLAIR hyperintensity involving the periventricular deep white matter, consistent with chronic small vessel ischemic disease, moderately advanced in nature. Patchy involvement of the pons. Gliosis involving the subcortical left frontal lobe, likely Xuan Mateus remote ischemic infarct, stable (series 6, image 22). Few scattered small remote bilateral cerebellar infarcts noted, greater on the right. No evidence for acute or subacute ischemia. Gray-white matter differentiation maintained. No acute intracranial hemorrhage. Few small foci of chronic hemosiderin staining noted involving the bilateral cerebral hemispheres (series 7, image 80), suggesting prior hemorrhage at these locations. No mass lesion, midline shift or mass effect. No hydrocephalus or  extra-axial fluid collection. Pituitary gland and suprasellar region within normal limits. No intrinsic temporal lobe abnormality. Vascular: Major intracranial vascular flow voids are maintained. Skull and upper cervical spine: Craniocervical junction within normal limits. Bone marrow signal intensity normal. Small lipoma noted at the right parietal scalp. Scalp soft tissues otherwise unremarkable. Sinuses/Orbits: Prior bilateral ocular lens replacement. Paranasal sinuses are clear. Trace right mass or muscle, Other: None. IMPRESSION: 1. No acute intracranial abnormality. 2. Moderately advanced cerebral atrophy with chronic small vessel ischemic disease, with Jerolene Kupfer few scattered small remote bilateral cerebellar infarcts. 3. Few small chronic foci of hemosiderin staining involving the bilateral cerebral hemispheres, suggesting prior hemorrhage at these locations. Findings could potentially act as Danity Schmelzer seizure focus. Electronically Signed   By: Rise Mu M.D.   On: 10/12/2023 20:57   DG Lumbar Spine 2-3 Views  Result Date:  10/09/2023 CLINICAL DATA:  Fall with back pain EXAM: LUMBAR SPINE - 2-3 VIEW COMPARISON:  Radiograph 10/03/2023, lumbar radiograph 08/03/2020 FINDINGS: Limited by the presence of enteral contrast and large calcified fibroid in the pelvis. Levoscoliosis. L4 and L5 vertebral bodies are obscured by enteral contrast. Advanced degenerative changes at L3-L4 with mild disc space narrowing at the remaining lumbar levels. Imaged vertebral bodies demonstrate grossly normal stature. IMPRESSION: L4 on L5 are obscured by contrast. No acute osseous abnormality allowing for this. Multilevel degenerative changes. Electronically Signed   By: Jasmine Pang M.D.   On: 10/09/2023 20:52   DG Thoracic Spine 2 View  Result Date: 10/09/2023 CLINICAL DATA:  Fall EXAM: THORACIC SPINE 2 VIEWS COMPARISON:  CT 09/23/2020 FINDINGS: Scoliosis. Vertebral body heights are maintained. Multilevel degenerative osteophytes. IMPRESSION: Scoliosis and degenerative change. Electronically Signed   By: Jasmine Pang M.D.   On: 10/09/2023 20:48   DG ESOPHAGUS W SINGLE CM (SOL OR THIN BA)  Result Date: 10/09/2023 CLINICAL DATA:  Dysphagia. Recommendation for formal barium esophagram following modified barium evaluation with speech pathology. EXAM: ESOPHAGUS/BARIUM SWALLOW/TABLET STUDY TECHNIQUE: Single contrast examination was performed using thin liquid barium. Procedure performed by Brayton El PA-C and supervised by Dr. Marliss Coots Study is somewhat limited given patient's inability to stand or lay prone. FLUOROSCOPY: Radiation Exposure Index (as provided by the fluoroscopic device): 16.70 mGy Kerma COMPARISON:  None Available. FINDINGS: Swallowing: Appears normal. No vestibular penetration or aspiration seen. Pharynx: Unremarkable. Esophagus: No definitive mucosal lesions. Both tertiary and absent contractions resulting contrast stasis and pooling within the mid esophagus. Hiatal Hernia: None. Gastroesophageal reflux: None visualized.  Ingested 13 mm barium tablet: Became lodged in the proximal lower esophagus, never reaching the GE junction. Despite additional swallows of thin barium and water, tablet would not move beyond this point. IMPRESSION: Limited study due to patient age and immobility. No definitive mucosal lesions or strictures. Findings consistent with significant dysmotility resulting in contrast stasis and pooling in the mid esophagus. Tablet would not pass beyond the lower esophagus. Electronically Signed   By: Marliss Coots M.D.   On: 10/09/2023 14:27   DG Fluoro Rm 1-60 Min  Result Date: 10/04/2023 CLINICAL DATA:  88 year old admitted with confusion. Barium swallow requested. EXAM: DG C-ARM 1-60 MIN CONTRAST:  None FLUOROSCOPY: Fluoroscopy Time: 0.1 mGy COMPARISON:  None Available. FINDINGS: Scout imaging obtained in anticipation of esophagram. Unfortunately, patient non-participatory in exam due to confusion. IMPRESSION: Patient received scout imaging for barium swallow however ultimately procedure terminated due to confusion and patient's inability to participate. Electronically Signed   By: Leanna Battles M.D.   On: 10/04/2023 15:16  ECHOCARDIOGRAM COMPLETE  Result Date: 10/03/2023    ECHOCARDIOGRAM REPORT   Patient Name:   SHANEL CONTRERAS Date of Exam: 10/03/2023 Medical Rec #:  [id number]      Height:       62.0 in Accession #:    [id number]     Weight:       99.0 lb Date of Birth:  [birth year]       BSA:          1.418 m Patient Age:    88 years       BP:           133/68 mmHg Patient Gender: F              HR:           93 bpm. Exam Location:  Inpatient Procedure: 2D Echo, Cardiac Doppler and Color Doppler Indications:     Acutre respiratory distress R06.03  History:         Patient has prior history of Echocardiogram examinations, most                  recent 06/15/2019. COPD; Risk Factors:Hypertension.  Sonographer:     Darlys Gales Referring Phys:  [id number] Antionette Poles BABCOCK Diagnosing Phys: Lennie Odor MD IMPRESSIONS   1. Left ventricular ejection fraction, by estimation, is 70 to 75%. The left ventricle has hyperdynamic function. The left ventricle has no regional wall motion abnormalities. There is mild left ventricular hypertrophy. Left ventricular diastolic parameters are indeterminate.  2. Right ventricular systolic function is normal. The right ventricular size is normal. There is mildly elevated pulmonary artery systolic pressure. The estimated right ventricular systolic pressure is 38.2 mmHg.  3. The mitral valve is grossly normal. Mild mitral valve regurgitation. No evidence of mitral stenosis.  4. The aortic valve is tricuspid. There is mild calcification of the aortic valve. Aortic valve regurgitation is not visualized. Aortic valve sclerosis is present, with no evidence of aortic valve stenosis.  5. The inferior vena cava is normal in size with <50% respiratory variability, suggesting right atrial pressure of 8 mmHg. FINDINGS  Left Ventricle: Left ventricular ejection fraction, by estimation, is 70 to 75%. The left ventricle has hyperdynamic function. The left ventricle has no regional wall motion abnormalities. The left ventricular internal cavity size was small. There is mild left ventricular hypertrophy. Left ventricular diastolic parameters are indeterminate. Right Ventricle: The right ventricular size is normal. No increase in right ventricular wall thickness. Right ventricular systolic function is normal. There is mildly elevated pulmonary artery systolic pressure. The tricuspid regurgitant velocity is 2.75  m/s, and with an assumed right atrial pressure of 8 mmHg, the estimated right ventricular systolic pressure is 38.2 mmHg. Left Atrium: Left atrial size was normal in size. Right Atrium: Right atrial size was normal in size. Pericardium: There is no evidence of pericardial effusion. Mitral Valve: The mitral valve is grossly normal. Mild mitral valve regurgitation. No evidence of mitral valve stenosis.  Tricuspid Valve: The tricuspid valve is grossly normal. Tricuspid valve regurgitation is mild . No evidence of tricuspid stenosis. Aortic Valve: The aortic valve is tricuspid. There is mild calcification of the aortic valve. Aortic valve regurgitation is not visualized. Aortic valve sclerosis is present, with no evidence of aortic valve stenosis. Aortic valve mean gradient measures 5.0 mmHg. Aortic valve peak gradient measures 7.5 mmHg. Aortic valve area, by VTI measures 2.73 cm. Pulmonic Valve: The pulmonic valve was not well visualized. Pulmonic valve regurgitation  is not visualized. Aorta: The aortic root is normal in size and structure. Venous: The inferior vena cava is normal in size with less than 50% respiratory variability, suggesting right atrial pressure of 8 mmHg. IAS/Shunts: The atrial septum is grossly normal.  LEFT VENTRICLE PLAX 2D LVIDd:         3.10 cm   Diastology LVIDs:         1.80 cm   LV e' medial:    6.42 cm/s LV PW:         1.20 cm   LV E/e' medial:  9.5 LV IVS:        1.00 cm   LV e' lateral:   7.29 cm/s LVOT diam:     1.80 cm   LV E/e' lateral: 8.3 LV SV:         62 LV SV Index:   44 LVOT Area:     2.54 cm  RIGHT VENTRICLE RV S prime:     11.50 cm/s TAPSE (M-mode): 2.5 cm LEFT ATRIUM             Index        RIGHT ATRIUM           Index LA Vol (A2C):   35.6 ml 25.11 ml/m  RA Area:     10.20 cm LA Vol (A4C):   30.7 ml 21.65 ml/m  RA Volume:   20.00 ml  14.11 ml/m LA Biplane Vol: 35.7 ml 25.18 ml/m  AORTIC VALVE AV Area (Vmax):    2.43 cm AV Area (Vmean):   2.38 cm AV Area (VTI):     2.73 cm AV Vmax:           137.00 cm/s AV Vmean:          101.000 cm/s AV VTI:            0.228 m AV Peak Grad:      7.5 mmHg AV Mean Grad:      5.0 mmHg LVOT Vmax:         131.00 cm/s LVOT Vmean:        94.500 cm/s LVOT VTI:          0.245 m LVOT/AV VTI ratio: 1.07  AORTA Ao Root diam: 2.40 cm MITRAL VALVE               TRICUSPID VALVE MV Area (PHT): 2.10 cm    TR Peak grad:   30.2 mmHg MV Decel Time:  362 msec    TR Vmax:        275.00 cm/s MV E velocity: 60.80 cm/s MV Juanette Urizar velocity: 94.30 cm/s  SHUNTS MV E/Shiara Mcgough ratio:  0.64        Systemic VTI:  0.24 m                            Systemic Diam: 1.80 cm Lennie Odor MD Electronically signed by Lennie Odor MD Signature Date/Time: 10/03/2023/3:15:36 PM    Final (Updated)    EEG adult  Result Date: 10/03/2023 Charlsie Quest, MD     10/03/2023  8:42 AM Patient Name: PHRONIA ASCH MRN: [id number] Epilepsy Attending: Charlsie Quest Referring Physician/Provider: Simonne Martinet, NP Date: 10/03/2023 Duration: 24.39 mins Patient history: 88 year old female w/ extensive medical hx as outlined below, currently resides at [facility] [facility] in Aug 2024. Per Hx was talking w/ family and suddenly  started seizing and foaming at mouth. On EMS arrival was unresponsive, had some facial twitching but before they could medicate for possible seizure she became apneic. EEG to evaluate for seizure Level of alertness: comatose/ lethargic AEDs during EEG study: LEV Technical aspects: This EEG study was done with scalp electrodes positioned according to the 10-20 International system of electrode placement. Electrical activity was reviewed with band pass filter of 1-70Hz , sensitivity of 7 uV/mm, display speed of 64mm/sec with Eren Ryser 60Hz  notched filter applied as appropriate. EEG data were recorded continuously and digitally stored.  Video monitoring was available and reviewed as appropriate. Description: EEG showed continuous generalized 3 to 5 Hz theta-delta slowing, at times with triphasic morphology admixed with 15 to 18 Hz beta activity distributed symmetrically and diffusely.  Sharp transients were noted in left frontal region. Hyperventilation and photic stimulation were not performed.   ABNORMALITY - Continuous slow, generalized IMPRESSION: This study is suggestive of severe diffuse encephalopathy, likely secondary to toxin-metabolic causes. No seizures or  definite epileptiform discharges were seen throughout the recording. Please note lack of epileptiform activity during interictal EEG does not exclude the diagnosis of epilepsy. Charlsie Quest   DG Chest Port 1 View  Result Date: 10/03/2023 CLINICAL DATA:  Respiratory failure. Ventilator dependence. Evaluate for pneumonia. EXAM: PORTABLE CHEST 1 VIEW COMPARISON:  10/02/2023 FINDINGS: Endotracheal tube tip is approximately 1.5 cm above the base of the carina. The lungs are clear without focal pneumonia, edema, pneumothorax or pleural effusion. Skin fold overlies the upper right lung. The cardiopericardial silhouette is within normal limits for size. Bones are diffusely demineralized. Telemetry leads overlie the chest. IMPRESSION: 1. Endotracheal tube tip is approximately 1.5 cm above the base of the carina. 2. No acute cardiopulmonary findings. Electronically Signed   By: Kennith Center M.D.   On: 10/03/2023 06:58   MR BRAIN WO CONTRAST  Result Date: 10/03/2023 CLINICAL DATA:  Hypertensive emergency EXAM: MRI HEAD WITHOUT CONTRAST TECHNIQUE: Multiplanar, multiecho pulse sequences of the brain and surrounding structures were obtained without intravenous contrast. COMPARISON:  None Available. FINDINGS: Brain: No acute infarct, mass effect or extra-axial collection. Trace, old subarachnoid blood products over both convexities. 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 There is multifocal hyperintense T2-weighted signal within the white matter. Parenchymal volume and CSF spaces are normal. The midline structures are normal. Vascular: Normal flow voids. Skull and upper cervical spine: At least moderate upper cervical spinal canal stenosis. Sinuses/Orbits:No paranasal sinus fluid levels or advanced mucosal thickening. No mastoid or middle ear effusion. Normal orbits. IMPRESSION: 1. No acute intracranial abnormality. 2. 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 3. At  least moderate upper cervical spinal canal stenosis. Electronically Signed   By: Deatra Robinson M.D.   On: 10/03/2023 03:56   DG Abd 1 View  Result Date: 10/03/2023 CLINICAL DATA:  MRI screening EXAM: ABDOMEN - 1 VIEW COMPARISON:  CT abdomen/pelvis dated 08/02/2017 FINDINGS: Large calcified fibroid overlying the pelvis. SP tori contrast in the bilateral renal collecting systems. Status post ORIF of the right hip, incompletely visualized. This does not preclude MRI. Mild degenerative changes of the left hip. IMPRESSION: Status post ORIF of the right hip, incompletely visualized. This does not preclude MRI. Large calcified fibroid overlying the pelvis. Electronically Signed   By: Charline Bills M.D.   On: 10/03/2023 02:39   CT ANGIO HEAD NECK W WO CM  Result Date: 10/02/2023 CLINICAL DATA:  Seizure EXAM: CT ANGIOGRAPHY HEAD AND NECK WITH AND WITHOUT  CONTRAST TECHNIQUE: Multidetector CT imaging of the head and neck was performed using the standard protocol during bolus administration of intravenous contrast. Multiplanar CT image reconstructions and MIPs were obtained to evaluate the vascular anatomy. Carotid stenosis measurements (when applicable) are obtained utilizing NASCET criteria, using the distal internal carotid diameter as the denominator. RADIATION DOSE REDUCTION: This exam was performed according to the departmental dose-optimization program which includes automated exposure control, adjustment of the mA and/or kV according to patient size and/or use of iterative reconstruction technique. CONTRAST:  75mL OMNIPAQUE IOHEXOL 350 MG/ML SOLN COMPARISON:  None Available. FINDINGS: CTA NECK FINDINGS SKELETON: No acute abnormality or high grade bony spinal canal stenosis. OTHER NECK: Endotracheal tube with tip below the field of view. Fluid throughout the upper airway. UPPER CHEST: Biapical emphysema. AORTIC ARCH: There is calcific atherosclerosis of the aortic arch. Conventional 3 vessel aortic branching  pattern. RIGHT CAROTID SYSTEM: No dissection, occlusion or aneurysm. Mild atherosclerotic calcification at the carotid bifurcation without hemodynamically significant stenosis. LEFT CAROTID SYSTEM: No dissection, occlusion or aneurysm. Mild atherosclerotic calcification at the carotid bifurcation without hemodynamically significant stenosis. VERTEBRAL ARTERIES: Right dominant configuration. There is no dissection, occlusion or flow-limiting stenosis to the skull base (V1-V3 segments). CTA HEAD FINDINGS POSTERIOR CIRCULATION: --Vertebral arteries: Normal V4 segments. --Inferior cerebellar arteries: Normal. --Basilar artery: Normal. --Superior cerebellar arteries: Normal. --Posterior cerebral arteries (PCA): Normal. ANTERIOR CIRCULATION: --Intracranial internal carotid arteries: Normal. --Anterior cerebral arteries (ACA): Normal. --Middle cerebral arteries (MCA): Normal. VENOUS SINUSES: As permitted by contrast timing, patent. ANATOMIC VARIANTS: None Review of the MIP images confirms the above findings. IMPRESSION: 1. No emergent large vessel occlusion or hemodynamically significant stenosis of the head or neck. 2. Mild bilateral carotid bifurcation atherosclerosis without hemodynamically significant stenosis. Aortic Atherosclerosis (ICD10-I70.0) and Emphysema (ICD10-J43.9). Electronically Signed   By: Deatra Robinson M.D.   On: 10/02/2023 22:09   CT Head Wo Contrast  Result Date: 10/02/2023 CLINICAL DATA:  Seizure, new onset, no history of trauma. EXAM: CT HEAD WITHOUT CONTRAST TECHNIQUE: Contiguous axial images were obtained from the base of the skull through the vertex without intravenous contrast. RADIATION DOSE REDUCTION: This exam was performed according to the departmental dose-optimization program which includes automated exposure control, adjustment of the mA and/or kV according to patient size and/or use of iterative reconstruction technique. COMPARISON:  07/18/2023. FINDINGS: Brain: No acute intracranial  hemorrhage, midline shift or mass effect. No extra-axial fluid collection is seen. Mild diffuse atrophy is noted. Extensive subcortical and periventricular white matter hypodensities are present bilaterally. No hydrocephalus. An old lacunar infarct is present in the right cerebellar hemisphere. Vascular: No hyperdense vessel or unexpected calcification. Skull: Normal. Negative for fracture or focal lesion. Sinuses/Orbits: There is partial opacification of the ethmoid air cells. No acute orbital abnormality. Other: None. IMPRESSION: 1. No acute intracranial process. 2. Atrophy with chronic microvascular ischemic changes. Electronically Signed   By: Thornell Sartorius M.D.   On: 10/02/2023 21:49   DG Chest Portable 1 View  Result Date: 10/02/2023 CLINICAL DATA:  Intubation EXAM: PORTABLE CHEST 1 VIEW COMPARISON:  07/10/2023 FINDINGS: Endotracheal tube is in place with the tip 4 cm above the carina. Heart mediastinal contours within normal limits. No confluent opacities or effusions. No acute bony abnormality. IMPRESSION: ET tube 4 cm above the carina.  No active disease. Electronically Signed   By: Charlett Nose M.D.   On: 10/02/2023 21:20    Microbiology: Recent Results (from the past 240 hour(s))  Urine Culture (for pregnant, neutropenic or urologic patients  or patients with an indwelling urinary catheter)     Status: Abnormal   Collection Time: 10/07/23  1:08 PM   Specimen: Urine, Clean Catch  Result Value Ref Range Status   Specimen Description URINE, CLEAN CATCH  Final   Special Requests   Final    NONE Performed at [facility], [address], [place], [place] [address]    Culture >=100,000 COLONIES/mL ESCHERICHIA COLI (Kathleene Bergemann)  Final   Report Status 10/10/2023 FINAL  Final   Organism ID, Bacteria ESCHERICHIA COLI (Najeeb Uptain)  Final      Susceptibility   Escherichia coli - MIC*    AMPICILLIN 8 SENSITIVE Sensitive     CEFAZOLIN <=4 SENSITIVE Sensitive     CEFEPIME <=0.12 SENSITIVE Sensitive      CEFTRIAXONE <=0.25 SENSITIVE Sensitive     CIPROFLOXACIN <=0.25 SENSITIVE Sensitive     GENTAMICIN <=1 SENSITIVE Sensitive     IMIPENEM <=0.25 SENSITIVE Sensitive     NITROFURANTOIN <=16 SENSITIVE Sensitive     TRIMETH/SULFA <=20 SENSITIVE Sensitive     AMPICILLIN/SULBACTAM <=2 SENSITIVE Sensitive     PIP/TAZO <=4 SENSITIVE Sensitive ug/mL    * >=100,000 COLONIES/mL ESCHERICHIA COLI     Labs: Basic Metabolic Panel: Recent Labs  Lab 10/07/23 0438 10/09/23 1001 10/12/23 0737 10/13/23 0457  NA 141  --  138 138  K 3.3* 4.1 4.6 4.9  CL 103  --  98 98  CO2 31  --  30 33*  GLUCOSE 92  --  79 89  BUN 14  --  13 17  CREATININE 0.42*  --  0.64 0.64  CALCIUM 9.8  --  9.2 9.7  MG  --   --  2.0 2.0  PHOS  --   --  4.0 3.8   Liver Function Tests: Recent Labs  Lab 10/13/23 0457  AST 13*  ALT 11  ALKPHOS 41  BILITOT 0.6  PROT 5.3*  ALBUMIN 3.2*   No results for input(s): "LIPASE", "AMYLASE" in the last 168 hours. No results for input(s): "AMMONIA" in the last 168 hours. CBC: Recent Labs  Lab 10/13/23 0457  WBC 5.5  NEUTROABS 3.6  HGB 9.3*  HCT 31.3*  MCV 96.3  PLT 315   Cardiac Enzymes: No results for input(s): "CKTOTAL", "CKMB", "CKMBINDEX", "TROPONINI" in the last 168 hours. BNP: BNP (last 3 results) No results for input(s): "BNP" in the last 8760 hours.  ProBNP (last 3 results) No results for input(s): "PROBNP" in the last 8760 hours.  CBG: Recent Labs  Lab 10/12/23 1702 10/12/23 2134 10/12/23 2226 10/12/23 2229 10/13/23 0608  GLUCAP 83 61* 46* 71 79       Signed:  Lacretia Nicks MD.  [facility] 10/13/2023, 11:49 AM

## 2023-10-16 DIAGNOSIS — G9341 Metabolic encephalopathy: Secondary | ICD-10-CM | POA: Diagnosis not present

## 2023-10-16 DIAGNOSIS — R1313 Dysphagia, pharyngeal phase: Secondary | ICD-10-CM | POA: Diagnosis not present

## 2023-10-16 DIAGNOSIS — M6281 Muscle weakness (generalized): Secondary | ICD-10-CM | POA: Diagnosis not present

## 2023-10-16 DIAGNOSIS — I161 Hypertensive emergency: Secondary | ICD-10-CM | POA: Diagnosis not present

## 2023-10-16 DIAGNOSIS — R2689 Other abnormalities of gait and mobility: Secondary | ICD-10-CM | POA: Diagnosis not present

## 2023-10-16 DIAGNOSIS — J9601 Acute respiratory failure with hypoxia: Secondary | ICD-10-CM | POA: Diagnosis not present

## 2023-10-16 DIAGNOSIS — R2681 Unsteadiness on feet: Secondary | ICD-10-CM | POA: Diagnosis not present

## 2023-10-17 DIAGNOSIS — M6281 Muscle weakness (generalized): Secondary | ICD-10-CM | POA: Diagnosis not present

## 2023-10-17 DIAGNOSIS — G9341 Metabolic encephalopathy: Secondary | ICD-10-CM | POA: Diagnosis not present

## 2023-10-17 DIAGNOSIS — R2681 Unsteadiness on feet: Secondary | ICD-10-CM | POA: Diagnosis not present

## 2023-10-17 DIAGNOSIS — R2689 Other abnormalities of gait and mobility: Secondary | ICD-10-CM | POA: Diagnosis not present

## 2023-10-17 DIAGNOSIS — R1313 Dysphagia, pharyngeal phase: Secondary | ICD-10-CM | POA: Diagnosis not present

## 2023-10-18 DIAGNOSIS — G9341 Metabolic encephalopathy: Secondary | ICD-10-CM | POA: Diagnosis not present

## 2023-10-18 DIAGNOSIS — R2689 Other abnormalities of gait and mobility: Secondary | ICD-10-CM | POA: Diagnosis not present

## 2023-10-18 DIAGNOSIS — R1313 Dysphagia, pharyngeal phase: Secondary | ICD-10-CM | POA: Diagnosis not present

## 2023-10-18 DIAGNOSIS — R2681 Unsteadiness on feet: Secondary | ICD-10-CM | POA: Diagnosis not present

## 2023-10-18 DIAGNOSIS — M6281 Muscle weakness (generalized): Secondary | ICD-10-CM | POA: Diagnosis not present

## 2023-10-19 DIAGNOSIS — R1313 Dysphagia, pharyngeal phase: Secondary | ICD-10-CM | POA: Diagnosis not present

## 2023-10-19 DIAGNOSIS — M6281 Muscle weakness (generalized): Secondary | ICD-10-CM | POA: Diagnosis not present

## 2023-10-19 DIAGNOSIS — R2681 Unsteadiness on feet: Secondary | ICD-10-CM | POA: Diagnosis not present

## 2023-10-19 DIAGNOSIS — R2689 Other abnormalities of gait and mobility: Secondary | ICD-10-CM | POA: Diagnosis not present

## 2023-10-19 DIAGNOSIS — G9341 Metabolic encephalopathy: Secondary | ICD-10-CM | POA: Diagnosis not present

## 2023-10-19 DIAGNOSIS — I10 Essential (primary) hypertension: Secondary | ICD-10-CM | POA: Diagnosis not present

## 2023-10-20 DIAGNOSIS — M6281 Muscle weakness (generalized): Secondary | ICD-10-CM | POA: Diagnosis not present

## 2023-10-20 DIAGNOSIS — R2681 Unsteadiness on feet: Secondary | ICD-10-CM | POA: Diagnosis not present

## 2023-10-20 DIAGNOSIS — R1313 Dysphagia, pharyngeal phase: Secondary | ICD-10-CM | POA: Diagnosis not present

## 2023-10-20 DIAGNOSIS — G9341 Metabolic encephalopathy: Secondary | ICD-10-CM | POA: Diagnosis not present

## 2023-10-20 DIAGNOSIS — R2689 Other abnormalities of gait and mobility: Secondary | ICD-10-CM | POA: Diagnosis not present

## 2023-10-21 DIAGNOSIS — G9341 Metabolic encephalopathy: Secondary | ICD-10-CM | POA: Diagnosis not present

## 2023-10-21 DIAGNOSIS — R1313 Dysphagia, pharyngeal phase: Secondary | ICD-10-CM | POA: Diagnosis not present

## 2023-10-21 DIAGNOSIS — R2681 Unsteadiness on feet: Secondary | ICD-10-CM | POA: Diagnosis not present

## 2023-10-21 DIAGNOSIS — R2689 Other abnormalities of gait and mobility: Secondary | ICD-10-CM | POA: Diagnosis not present

## 2023-10-21 DIAGNOSIS — M6281 Muscle weakness (generalized): Secondary | ICD-10-CM | POA: Diagnosis not present

## 2023-10-23 DIAGNOSIS — J9692 Respiratory failure, unspecified with hypercapnia: Secondary | ICD-10-CM | POA: Diagnosis not present

## 2023-10-23 DIAGNOSIS — R1313 Dysphagia, pharyngeal phase: Secondary | ICD-10-CM | POA: Diagnosis not present

## 2023-10-23 DIAGNOSIS — R2689 Other abnormalities of gait and mobility: Secondary | ICD-10-CM | POA: Diagnosis not present

## 2023-10-23 DIAGNOSIS — I161 Hypertensive emergency: Secondary | ICD-10-CM | POA: Diagnosis not present

## 2023-10-23 DIAGNOSIS — I10 Essential (primary) hypertension: Secondary | ICD-10-CM | POA: Diagnosis not present

## 2023-10-23 DIAGNOSIS — R2681 Unsteadiness on feet: Secondary | ICD-10-CM | POA: Diagnosis not present

## 2023-10-23 DIAGNOSIS — G9341 Metabolic encephalopathy: Secondary | ICD-10-CM | POA: Diagnosis not present

## 2023-10-23 DIAGNOSIS — M6281 Muscle weakness (generalized): Secondary | ICD-10-CM | POA: Diagnosis not present

## 2023-10-24 DIAGNOSIS — G9341 Metabolic encephalopathy: Secondary | ICD-10-CM | POA: Diagnosis not present

## 2023-10-24 DIAGNOSIS — M6281 Muscle weakness (generalized): Secondary | ICD-10-CM | POA: Diagnosis not present

## 2023-10-24 DIAGNOSIS — R2681 Unsteadiness on feet: Secondary | ICD-10-CM | POA: Diagnosis not present

## 2023-10-24 DIAGNOSIS — R1313 Dysphagia, pharyngeal phase: Secondary | ICD-10-CM | POA: Diagnosis not present

## 2023-10-24 DIAGNOSIS — R2689 Other abnormalities of gait and mobility: Secondary | ICD-10-CM | POA: Diagnosis not present

## 2023-10-25 DIAGNOSIS — G9341 Metabolic encephalopathy: Secondary | ICD-10-CM | POA: Diagnosis not present

## 2023-10-25 DIAGNOSIS — R1313 Dysphagia, pharyngeal phase: Secondary | ICD-10-CM | POA: Diagnosis not present

## 2023-10-25 DIAGNOSIS — M6281 Muscle weakness (generalized): Secondary | ICD-10-CM | POA: Diagnosis not present

## 2023-10-25 DIAGNOSIS — R2681 Unsteadiness on feet: Secondary | ICD-10-CM | POA: Diagnosis not present

## 2023-10-25 DIAGNOSIS — R2689 Other abnormalities of gait and mobility: Secondary | ICD-10-CM | POA: Diagnosis not present

## 2023-10-26 DIAGNOSIS — M6281 Muscle weakness (generalized): Secondary | ICD-10-CM | POA: Diagnosis not present

## 2023-10-26 DIAGNOSIS — R2689 Other abnormalities of gait and mobility: Secondary | ICD-10-CM | POA: Diagnosis not present

## 2023-10-26 DIAGNOSIS — R1313 Dysphagia, pharyngeal phase: Secondary | ICD-10-CM | POA: Diagnosis not present

## 2023-10-26 DIAGNOSIS — G9341 Metabolic encephalopathy: Secondary | ICD-10-CM | POA: Diagnosis not present

## 2023-10-26 DIAGNOSIS — R2681 Unsteadiness on feet: Secondary | ICD-10-CM | POA: Diagnosis not present

## 2023-10-27 DIAGNOSIS — R2681 Unsteadiness on feet: Secondary | ICD-10-CM | POA: Diagnosis not present

## 2023-10-27 DIAGNOSIS — R2689 Other abnormalities of gait and mobility: Secondary | ICD-10-CM | POA: Diagnosis not present

## 2023-10-27 DIAGNOSIS — R1313 Dysphagia, pharyngeal phase: Secondary | ICD-10-CM | POA: Diagnosis not present

## 2023-10-27 DIAGNOSIS — G9341 Metabolic encephalopathy: Secondary | ICD-10-CM | POA: Diagnosis not present

## 2023-10-27 DIAGNOSIS — M6281 Muscle weakness (generalized): Secondary | ICD-10-CM | POA: Diagnosis not present

## 2023-10-28 DIAGNOSIS — R1313 Dysphagia, pharyngeal phase: Secondary | ICD-10-CM | POA: Diagnosis not present

## 2023-10-28 DIAGNOSIS — M6281 Muscle weakness (generalized): Secondary | ICD-10-CM | POA: Diagnosis not present

## 2023-10-28 DIAGNOSIS — R2681 Unsteadiness on feet: Secondary | ICD-10-CM | POA: Diagnosis not present

## 2023-10-28 DIAGNOSIS — G9341 Metabolic encephalopathy: Secondary | ICD-10-CM | POA: Diagnosis not present

## 2023-10-28 DIAGNOSIS — R2689 Other abnormalities of gait and mobility: Secondary | ICD-10-CM | POA: Diagnosis not present

## 2023-10-29 DIAGNOSIS — R2681 Unsteadiness on feet: Secondary | ICD-10-CM | POA: Diagnosis not present

## 2023-10-29 DIAGNOSIS — G9341 Metabolic encephalopathy: Secondary | ICD-10-CM | POA: Diagnosis not present

## 2023-10-29 DIAGNOSIS — R1313 Dysphagia, pharyngeal phase: Secondary | ICD-10-CM | POA: Diagnosis not present

## 2023-10-29 DIAGNOSIS — M6281 Muscle weakness (generalized): Secondary | ICD-10-CM | POA: Diagnosis not present

## 2023-10-29 DIAGNOSIS — R2689 Other abnormalities of gait and mobility: Secondary | ICD-10-CM | POA: Diagnosis not present

## 2023-10-30 DIAGNOSIS — R2689 Other abnormalities of gait and mobility: Secondary | ICD-10-CM | POA: Diagnosis not present

## 2023-10-30 DIAGNOSIS — M6281 Muscle weakness (generalized): Secondary | ICD-10-CM | POA: Diagnosis not present

## 2023-10-30 DIAGNOSIS — R1313 Dysphagia, pharyngeal phase: Secondary | ICD-10-CM | POA: Diagnosis not present

## 2023-10-30 DIAGNOSIS — R2681 Unsteadiness on feet: Secondary | ICD-10-CM | POA: Diagnosis not present

## 2023-10-30 DIAGNOSIS — G9341 Metabolic encephalopathy: Secondary | ICD-10-CM | POA: Diagnosis not present

## 2023-11-24 DIAGNOSIS — R2689 Other abnormalities of gait and mobility: Secondary | ICD-10-CM | POA: Diagnosis not present

## 2023-11-24 DIAGNOSIS — R2681 Unsteadiness on feet: Secondary | ICD-10-CM | POA: Diagnosis not present

## 2023-11-24 DIAGNOSIS — M6281 Muscle weakness (generalized): Secondary | ICD-10-CM | POA: Diagnosis not present

## 2023-11-24 DIAGNOSIS — R1313 Dysphagia, pharyngeal phase: Secondary | ICD-10-CM | POA: Diagnosis not present

## 2023-11-24 DIAGNOSIS — G9341 Metabolic encephalopathy: Secondary | ICD-10-CM | POA: Diagnosis not present

## 2023-11-26 DIAGNOSIS — M6281 Muscle weakness (generalized): Secondary | ICD-10-CM | POA: Diagnosis not present

## 2023-11-26 DIAGNOSIS — R2689 Other abnormalities of gait and mobility: Secondary | ICD-10-CM | POA: Diagnosis not present

## 2023-11-26 DIAGNOSIS — R2681 Unsteadiness on feet: Secondary | ICD-10-CM | POA: Diagnosis not present

## 2023-11-26 DIAGNOSIS — R1313 Dysphagia, pharyngeal phase: Secondary | ICD-10-CM | POA: Diagnosis not present

## 2023-11-26 DIAGNOSIS — G9341 Metabolic encephalopathy: Secondary | ICD-10-CM | POA: Diagnosis not present

## 2023-11-27 DIAGNOSIS — R1313 Dysphagia, pharyngeal phase: Secondary | ICD-10-CM | POA: Diagnosis not present

## 2023-11-27 DIAGNOSIS — R2689 Other abnormalities of gait and mobility: Secondary | ICD-10-CM | POA: Diagnosis not present

## 2023-11-27 DIAGNOSIS — M6281 Muscle weakness (generalized): Secondary | ICD-10-CM | POA: Diagnosis not present

## 2023-11-27 DIAGNOSIS — G9341 Metabolic encephalopathy: Secondary | ICD-10-CM | POA: Diagnosis not present

## 2023-11-27 DIAGNOSIS — R2681 Unsteadiness on feet: Secondary | ICD-10-CM | POA: Diagnosis not present

## 2023-11-28 DIAGNOSIS — R2681 Unsteadiness on feet: Secondary | ICD-10-CM | POA: Diagnosis not present

## 2023-11-28 DIAGNOSIS — R1313 Dysphagia, pharyngeal phase: Secondary | ICD-10-CM | POA: Diagnosis not present

## 2023-11-28 DIAGNOSIS — G9341 Metabolic encephalopathy: Secondary | ICD-10-CM | POA: Diagnosis not present

## 2023-11-28 DIAGNOSIS — R2689 Other abnormalities of gait and mobility: Secondary | ICD-10-CM | POA: Diagnosis not present

## 2023-11-28 DIAGNOSIS — M6281 Muscle weakness (generalized): Secondary | ICD-10-CM | POA: Diagnosis not present

## 2023-11-29 DIAGNOSIS — M6281 Muscle weakness (generalized): Secondary | ICD-10-CM | POA: Diagnosis not present

## 2023-11-29 DIAGNOSIS — R2681 Unsteadiness on feet: Secondary | ICD-10-CM | POA: Diagnosis not present

## 2023-11-29 DIAGNOSIS — R2689 Other abnormalities of gait and mobility: Secondary | ICD-10-CM | POA: Diagnosis not present

## 2023-11-29 DIAGNOSIS — J449 Chronic obstructive pulmonary disease, unspecified: Secondary | ICD-10-CM | POA: Diagnosis not present

## 2023-12-01 DIAGNOSIS — R2689 Other abnormalities of gait and mobility: Secondary | ICD-10-CM | POA: Diagnosis not present

## 2023-12-01 DIAGNOSIS — J449 Chronic obstructive pulmonary disease, unspecified: Secondary | ICD-10-CM | POA: Diagnosis not present

## 2023-12-01 DIAGNOSIS — R2681 Unsteadiness on feet: Secondary | ICD-10-CM | POA: Diagnosis not present

## 2023-12-01 DIAGNOSIS — M6281 Muscle weakness (generalized): Secondary | ICD-10-CM | POA: Diagnosis not present

## 2023-12-04 DIAGNOSIS — R2681 Unsteadiness on feet: Secondary | ICD-10-CM | POA: Diagnosis not present

## 2023-12-04 DIAGNOSIS — R2689 Other abnormalities of gait and mobility: Secondary | ICD-10-CM | POA: Diagnosis not present

## 2023-12-04 DIAGNOSIS — M6281 Muscle weakness (generalized): Secondary | ICD-10-CM | POA: Diagnosis not present

## 2023-12-04 DIAGNOSIS — J449 Chronic obstructive pulmonary disease, unspecified: Secondary | ICD-10-CM | POA: Diagnosis not present

## 2023-12-05 DIAGNOSIS — R2689 Other abnormalities of gait and mobility: Secondary | ICD-10-CM | POA: Diagnosis not present

## 2023-12-05 DIAGNOSIS — M6281 Muscle weakness (generalized): Secondary | ICD-10-CM | POA: Diagnosis not present

## 2023-12-05 DIAGNOSIS — R2681 Unsteadiness on feet: Secondary | ICD-10-CM | POA: Diagnosis not present

## 2023-12-05 DIAGNOSIS — J449 Chronic obstructive pulmonary disease, unspecified: Secondary | ICD-10-CM | POA: Diagnosis not present

## 2023-12-06 DIAGNOSIS — J449 Chronic obstructive pulmonary disease, unspecified: Secondary | ICD-10-CM | POA: Diagnosis not present

## 2023-12-06 DIAGNOSIS — M6281 Muscle weakness (generalized): Secondary | ICD-10-CM | POA: Diagnosis not present

## 2023-12-06 DIAGNOSIS — R2689 Other abnormalities of gait and mobility: Secondary | ICD-10-CM | POA: Diagnosis not present

## 2023-12-06 DIAGNOSIS — R2681 Unsteadiness on feet: Secondary | ICD-10-CM | POA: Diagnosis not present

## 2023-12-07 DIAGNOSIS — M6281 Muscle weakness (generalized): Secondary | ICD-10-CM | POA: Diagnosis not present

## 2023-12-07 DIAGNOSIS — J449 Chronic obstructive pulmonary disease, unspecified: Secondary | ICD-10-CM | POA: Diagnosis not present

## 2023-12-07 DIAGNOSIS — R2689 Other abnormalities of gait and mobility: Secondary | ICD-10-CM | POA: Diagnosis not present

## 2023-12-07 DIAGNOSIS — R2681 Unsteadiness on feet: Secondary | ICD-10-CM | POA: Diagnosis not present

## 2024-03-06 ENCOUNTER — Inpatient Hospital Stay (HOSPITAL_COMMUNITY)
Admission: EM | Admit: 2024-03-06 | Discharge: 2024-04-02 | DRG: 100 | Disposition: A | Discharge status: Hospice | Source: Skilled Nursing Facility | Attending: Internal Medicine | Admitting: Internal Medicine

## 2024-03-06 ENCOUNTER — Emergency Department (HOSPITAL_COMMUNITY)

## 2024-03-06 ENCOUNTER — Inpatient Hospital Stay (HOSPITAL_COMMUNITY)

## 2024-03-06 ENCOUNTER — Encounter (HOSPITAL_COMMUNITY): Payer: Self-pay | Admitting: Emergency Medicine

## 2024-03-06 ENCOUNTER — Other Ambulatory Visit: Payer: Self-pay

## 2024-03-06 DIAGNOSIS — J9621 Acute and chronic respiratory failure with hypoxia: Secondary | ICD-10-CM | POA: Diagnosis present

## 2024-03-06 DIAGNOSIS — Z87891 Personal history of nicotine dependence: Secondary | ICD-10-CM

## 2024-03-06 DIAGNOSIS — Z681 Body mass index (BMI) 19 or less, adult: Secondary | ICD-10-CM | POA: Diagnosis not present

## 2024-03-06 DIAGNOSIS — E785 Hyperlipidemia, unspecified: Secondary | ICD-10-CM | POA: Diagnosis present

## 2024-03-06 DIAGNOSIS — R64 Cachexia: Secondary | ICD-10-CM | POA: Diagnosis present

## 2024-03-06 DIAGNOSIS — J440 Chronic obstructive pulmonary disease with acute lower respiratory infection: Secondary | ICD-10-CM | POA: Diagnosis present

## 2024-03-06 DIAGNOSIS — J449 Chronic obstructive pulmonary disease, unspecified: Secondary | ICD-10-CM | POA: Diagnosis not present

## 2024-03-06 DIAGNOSIS — Y95 Nosocomial condition: Secondary | ICD-10-CM | POA: Diagnosis present

## 2024-03-06 DIAGNOSIS — E871 Hypo-osmolality and hyponatremia: Secondary | ICD-10-CM | POA: Diagnosis present

## 2024-03-06 DIAGNOSIS — J9622 Acute and chronic respiratory failure with hypercapnia: Secondary | ICD-10-CM | POA: Diagnosis present

## 2024-03-06 DIAGNOSIS — Z9981 Dependence on supplemental oxygen: Secondary | ICD-10-CM | POA: Diagnosis not present

## 2024-03-06 DIAGNOSIS — R627 Adult failure to thrive: Secondary | ICD-10-CM | POA: Diagnosis present

## 2024-03-06 DIAGNOSIS — F03A3 Unspecified dementia, mild, with mood disturbance: Secondary | ICD-10-CM | POA: Diagnosis present

## 2024-03-06 DIAGNOSIS — Z66 Do not resuscitate: Secondary | ICD-10-CM | POA: Diagnosis not present

## 2024-03-06 DIAGNOSIS — N179 Acute kidney failure, unspecified: Secondary | ICD-10-CM | POA: Diagnosis not present

## 2024-03-06 DIAGNOSIS — J9811 Atelectasis: Secondary | ICD-10-CM | POA: Diagnosis not present

## 2024-03-06 DIAGNOSIS — F32A Depression, unspecified: Secondary | ICD-10-CM | POA: Diagnosis present

## 2024-03-06 DIAGNOSIS — R4701 Aphasia: Secondary | ICD-10-CM | POA: Diagnosis present

## 2024-03-06 DIAGNOSIS — J15211 Pneumonia due to Methicillin susceptible Staphylococcus aureus: Secondary | ICD-10-CM | POA: Diagnosis present

## 2024-03-06 DIAGNOSIS — D509 Iron deficiency anemia, unspecified: Secondary | ICD-10-CM | POA: Diagnosis present

## 2024-03-06 DIAGNOSIS — Z515 Encounter for palliative care: Secondary | ICD-10-CM

## 2024-03-06 DIAGNOSIS — Z8616 Personal history of COVID-19: Secondary | ICD-10-CM | POA: Diagnosis not present

## 2024-03-06 DIAGNOSIS — L98429 Non-pressure chronic ulcer of back with unspecified severity: Secondary | ICD-10-CM | POA: Diagnosis present

## 2024-03-06 DIAGNOSIS — G40901 Epilepsy, unspecified, not intractable, with status epilepticus: Principal | ICD-10-CM | POA: Diagnosis present

## 2024-03-06 DIAGNOSIS — G40909 Epilepsy, unspecified, not intractable, without status epilepticus: Secondary | ICD-10-CM | POA: Diagnosis not present

## 2024-03-06 DIAGNOSIS — R1310 Dysphagia, unspecified: Secondary | ICD-10-CM | POA: Diagnosis not present

## 2024-03-06 DIAGNOSIS — J9601 Acute respiratory failure with hypoxia: Secondary | ICD-10-CM | POA: Diagnosis not present

## 2024-03-06 DIAGNOSIS — Z7189 Other specified counseling: Secondary | ICD-10-CM | POA: Diagnosis not present

## 2024-03-06 DIAGNOSIS — G9340 Encephalopathy, unspecified: Secondary | ICD-10-CM | POA: Diagnosis not present

## 2024-03-06 DIAGNOSIS — D72829 Elevated white blood cell count, unspecified: Secondary | ICD-10-CM | POA: Diagnosis not present

## 2024-03-06 DIAGNOSIS — B9561 Methicillin susceptible Staphylococcus aureus infection as the cause of diseases classified elsewhere: Secondary | ICD-10-CM | POA: Diagnosis present

## 2024-03-06 DIAGNOSIS — I10 Essential (primary) hypertension: Secondary | ICD-10-CM | POA: Diagnosis present

## 2024-03-06 DIAGNOSIS — E11649 Type 2 diabetes mellitus with hypoglycemia without coma: Secondary | ICD-10-CM | POA: Diagnosis not present

## 2024-03-06 DIAGNOSIS — H9190 Unspecified hearing loss, unspecified ear: Secondary | ICD-10-CM | POA: Diagnosis present

## 2024-03-06 DIAGNOSIS — E46 Unspecified protein-calorie malnutrition: Secondary | ICD-10-CM | POA: Diagnosis not present

## 2024-03-06 DIAGNOSIS — Z90710 Acquired absence of both cervix and uterus: Secondary | ICD-10-CM

## 2024-03-06 DIAGNOSIS — R569 Unspecified convulsions: Secondary | ICD-10-CM

## 2024-03-06 DIAGNOSIS — Z79899 Other long term (current) drug therapy: Secondary | ICD-10-CM

## 2024-03-06 DIAGNOSIS — E43 Unspecified severe protein-calorie malnutrition: Secondary | ICD-10-CM | POA: Diagnosis present

## 2024-03-06 DIAGNOSIS — Z7982 Long term (current) use of aspirin: Secondary | ICD-10-CM

## 2024-03-06 LAB — URINALYSIS, W/ REFLEX TO CULTURE (INFECTION SUSPECTED)
Bilirubin Urine: NEGATIVE
Glucose, UA: NEGATIVE mg/dL
Hgb urine dipstick: NEGATIVE
Ketones, ur: NEGATIVE mg/dL
Nitrite: NEGATIVE
Protein, ur: 100 mg/dL — AB
Specific Gravity, Urine: 1.015 (ref 1.005–1.030)
WBC, UA: >50 WBC/hpf (ref 0–5)
pH: 6 (ref 5.0–8.0)

## 2024-03-06 LAB — I-STAT ARTERIAL BLOOD GAS, ED
Acid-Base Excess: 7 mmol/L — ABNORMAL HIGH (ref 0.0–2.0)
Acid-Base Excess: 8 mmol/L — ABNORMAL HIGH (ref 0.0–2.0)
Bicarbonate: 36.2 mmol/L — ABNORMAL HIGH (ref 20.0–28.0)
Bicarbonate: 34.7 mmol/L — ABNORMAL HIGH (ref 20.0–28.0)
Calcium, Ion: 1.38 mmol/L (ref 1.15–1.40)
Calcium, Ion: 1.35 mmol/L (ref 1.15–1.40)
HCT: 32 % — ABNORMAL LOW (ref 36.0–46.0)
HCT: 31 % — ABNORMAL LOW (ref 36.0–46.0)
Hemoglobin: 10.9 g/dL — ABNORMAL LOW (ref 12.0–15.0)
Hemoglobin: 10.5 g/dL — ABNORMAL LOW (ref 12.0–15.0)
O2 Saturation: 100 %
O2 Saturation: 100 %
Patient temperature: 97.7
Patient temperature: 98.3
Potassium: 4 mmol/L (ref 3.5–5.1)
Potassium: 4 mmol/L (ref 3.5–5.1)
Sodium: 143 mmol/L (ref 135–145)
Sodium: 142 mmol/L (ref 135–145)
TCO2: 39 mmol/L — ABNORMAL HIGH (ref 22–32)
TCO2: 36 mmol/L — ABNORMAL HIGH (ref 22–32)
pCO2 arterial: 81 mmHg (ref 32–48)
pCO2 arterial: 58.4 mmHg — ABNORMAL HIGH (ref 32–48)
pH, Arterial: 7.255 — ABNORMAL LOW (ref 7.35–7.45)
pH, Arterial: 7.381 (ref 7.35–7.45)
pO2, Arterial: 304 mmHg — ABNORMAL HIGH (ref 83–108)
pO2, Arterial: 263 mmHg — ABNORMAL HIGH (ref 83–108)

## 2024-03-06 LAB — GLUCOSE, CAPILLARY: Glucose-Capillary: 96 mg/dL (ref 70–99)

## 2024-03-06 LAB — COMPREHENSIVE METABOLIC PANEL WITH GFR
ALT: 11 U/L (ref 0–44)
AST: 18 U/L (ref 15–41)
Albumin: 3.8 g/dL (ref 3.5–5.0)
Alkaline Phosphatase: 44 U/L (ref 38–126)
Anion gap: 6 (ref 5–15)
BUN: 17 mg/dL (ref 8–23)
CO2: 33 mmol/L — ABNORMAL HIGH (ref 22–32)
Calcium: 9.6 mg/dL (ref 8.9–10.3)
Chloride: 105 mmol/L (ref 98–111)
Creatinine, Ser: 0.4 mg/dL — ABNORMAL LOW (ref 0.44–1.00)
GFR, Estimated: >60 mL/min (ref >60)
Glucose, Bld: 116 mg/dL — ABNORMAL HIGH (ref 70–99)
Potassium: 4.1 mmol/L (ref 3.5–5.1)
Sodium: 144 mmol/L (ref 135–145)
Total Bilirubin: 0.5 mg/dL (ref 0.0–1.2)
Total Protein: 6.4 g/dL — ABNORMAL LOW (ref 6.5–8.1)

## 2024-03-06 LAB — CBC WITH DIFFERENTIAL/PLATELET
Abs Immature Granulocytes: 0.04 10*3/uL (ref 0.00–0.07)
Basophils Absolute: 0.1 10*3/uL (ref 0.0–0.1)
Basophils Relative: 1 %
Eosinophils Absolute: 0.1 10*3/uL (ref 0.0–0.5)
Eosinophils Relative: 2 %
HCT: 36.6 % (ref 36.0–46.0)
Hemoglobin: 10.8 g/dL — ABNORMAL LOW (ref 12.0–15.0)
Immature Granulocytes: 1 %
Lymphocytes Relative: 8 %
Lymphs Abs: 0.6 10*3/uL — ABNORMAL LOW (ref 0.7–4.0)
MCH: 28.8 pg (ref 26.0–34.0)
MCHC: 29.5 g/dL — ABNORMAL LOW (ref 30.0–36.0)
MCV: 97.6 fL (ref 80.0–100.0)
Monocytes Absolute: 0.3 10*3/uL (ref 0.1–1.0)
Monocytes Relative: 4 %
Neutro Abs: 6.8 10*3/uL (ref 1.7–7.7)
Neutrophils Relative %: 84 %
Platelets: 265 10*3/uL (ref 150–400)
RBC: 3.75 MIL/uL — ABNORMAL LOW (ref 3.87–5.11)
RDW: 12.8 % (ref 11.5–15.5)
WBC: 7.9 10*3/uL (ref 4.0–10.5)
nRBC: 0 % (ref 0.0–0.2)

## 2024-03-06 LAB — RAPID URINE DRUG SCREEN, HOSP PERFORMED
Amphetamines: NOT DETECTED
Barbiturates: NOT DETECTED
Benzodiazepines: POSITIVE — AB
Cocaine: NOT DETECTED
Opiates: NOT DETECTED
Tetrahydrocannabinol: NOT DETECTED

## 2024-03-06 LAB — CBG MONITORING, ED: Glucose-Capillary: 113 mg/dL — ABNORMAL HIGH (ref 70–99)

## 2024-03-06 MED ORDER — DOCUSATE SODIUM 50 MG/5ML PO LIQD
100.0000 mg | Freq: Two times a day (BID) | ORAL | Status: DC
  Administered 2024-03-06 – 2024-03-09 (×6): 100 mg
  Filled 2024-03-06 (×7): qty 10

## 2024-03-06 MED ORDER — LEVETIRACETAM IN NACL 1500 MG/100ML IV SOLN
1500.0000 mg | Freq: Once | INTRAVENOUS | Status: AC
  Administered 2024-03-06: 1500 mg via INTRAVENOUS
  Filled 2024-03-06: qty 100

## 2024-03-06 MED ORDER — LACTATED RINGERS IV SOLN: INTRAVENOUS | Status: DC

## 2024-03-06 MED ORDER — ORAL CARE MOUTH RINSE: 15.0000 mL | OROMUCOSAL | Status: DC | PRN

## 2024-03-06 MED ORDER — ASPIRIN 81 MG PO CHEW
81.0000 mg | CHEWABLE_TABLET | Freq: Every day | ORAL | Status: DC
  Administered 2024-03-06 – 2024-03-10 (×5): 81 mg
  Filled 2024-03-06 (×5): qty 1

## 2024-03-06 MED ORDER — REVEFENACIN 175 MCG/3ML IN SOLN
175.0000 ug | Freq: Every day | RESPIRATORY_TRACT | Status: DC
  Administered 2024-03-07 – 2024-04-02 (×25): 175 ug via RESPIRATORY_TRACT
  Filled 2024-03-06 (×25): qty 3

## 2024-03-06 MED ORDER — DOCUSATE SODIUM 50 MG/5ML PO LIQD: 100.0000 mg | Freq: Two times a day (BID) | ORAL | Status: DC | PRN

## 2024-03-06 MED ORDER — FENTANYL CITRATE PF 50 MCG/ML IJ SOSY
25.0000 ug | PREFILLED_SYRINGE | INTRAMUSCULAR | Status: DC | PRN
  Filled 2024-03-06: qty 1

## 2024-03-06 MED ORDER — MIDAZOLAM HCL 2 MG/2ML IJ SOLN
1.0000 mg | INTRAMUSCULAR | Status: DC | PRN
  Administered 2024-03-07 – 2024-03-08 (×2): 2 mg via INTRAVENOUS
  Filled 2024-03-06 (×2): qty 2

## 2024-03-06 MED ORDER — ORAL CARE MOUTH RINSE
15.0000 mL | OROMUCOSAL | Status: DC
  Administered 2024-03-06 – 2024-03-16 (×106): 15 mL via OROMUCOSAL

## 2024-03-06 MED ORDER — IPRATROPIUM-ALBUTEROL 0.5-2.5 (3) MG/3ML IN SOLN
RESPIRATORY_TRACT | Status: AC
Start: 2024-03-06 — End: 2024-03-06
  Administered 2024-03-06: 3 mL via RESPIRATORY_TRACT
  Filled 2024-03-06: qty 3

## 2024-03-06 MED ORDER — IPRATROPIUM-ALBUTEROL 0.5-2.5 (3) MG/3ML IN SOLN: 3.0000 mL | Freq: Four times a day (QID) | RESPIRATORY_TRACT | Status: DC

## 2024-03-06 MED ORDER — ARFORMOTEROL TARTRATE 15 MCG/2ML IN NEBU
15.0000 ug | INHALATION_SOLUTION | Freq: Two times a day (BID) | RESPIRATORY_TRACT | Status: DC
  Administered 2024-03-07 – 2024-04-02 (×44): 15 ug via RESPIRATORY_TRACT
  Filled 2024-03-06 (×48): qty 2

## 2024-03-06 MED ORDER — BUDESONIDE 0.5 MG/2ML IN SUSP
0.5000 mg | Freq: Two times a day (BID) | RESPIRATORY_TRACT | Status: DC
  Administered 2024-03-07 – 2024-04-02 (×50): 0.5 mg via RESPIRATORY_TRACT
  Filled 2024-03-06 (×51): qty 2

## 2024-03-06 MED ORDER — FENTANYL CITRATE PF 50 MCG/ML IJ SOSY
25.0000 ug | PREFILLED_SYRINGE | INTRAMUSCULAR | Status: DC | PRN
  Administered 2024-03-06: 50 ug via INTRAVENOUS
  Administered 2024-03-07: 25 ug via INTRAVENOUS
  Administered 2024-03-07 (×2): 50 ug via INTRAVENOUS
  Administered 2024-03-07: 100 ug via INTRAVENOUS
  Administered 2024-03-08 – 2024-03-09 (×4): 50 ug via INTRAVENOUS
  Filled 2024-03-06 (×4): qty 1
  Filled 2024-03-06: qty 2
  Filled 2024-03-06 (×3): qty 1

## 2024-03-06 MED ORDER — DOCUSATE SODIUM 100 MG PO CAPS
100.0000 mg | ORAL_CAPSULE | Freq: Two times a day (BID) | ORAL | Status: DC | PRN
Start: 2024-03-06 — End: 2024-03-06

## 2024-03-06 MED ORDER — PANTOPRAZOLE SODIUM 40 MG IV SOLR
40.0000 mg | Freq: Every day | INTRAVENOUS | Status: DC
  Administered 2024-03-06 – 2024-03-11 (×6): 40 mg via INTRAVENOUS
  Filled 2024-03-06 (×6): qty 10

## 2024-03-06 MED ORDER — HEPARIN SODIUM (PORCINE) 5000 UNIT/ML IJ SOLN
5000.0000 [IU] | Freq: Three times a day (TID) | INTRAMUSCULAR | Status: DC
  Administered 2024-03-06 – 2024-03-31 (×75): 5000 [IU] via SUBCUTANEOUS
  Filled 2024-03-06 (×75): qty 1

## 2024-03-06 MED ORDER — ALBUTEROL SULFATE (2.5 MG/3ML) 0.083% IN NEBU: 2.5000 mg | INHALATION_SOLUTION | RESPIRATORY_TRACT | Status: DC | PRN

## 2024-03-06 MED ORDER — LEVETIRACETAM 500 MG PO TABS
500.0000 mg | ORAL_TABLET | Freq: Two times a day (BID) | ORAL | Status: DC
  Administered 2024-03-07: 500 mg
  Filled 2024-03-06: qty 1

## 2024-03-06 MED ORDER — PROPOFOL 1000 MG/100ML IV EMUL
0.0000 ug/kg/min | INTRAVENOUS | Status: DC
  Administered 2024-03-06: 10 ug/kg/min via INTRAVENOUS
  Administered 2024-03-07: 50 ug/kg/min via INTRAVENOUS
  Administered 2024-03-07: 40 ug/kg/min via INTRAVENOUS
  Filled 2024-03-06 (×3): qty 100

## 2024-03-06 MED ORDER — CHLORHEXIDINE GLUCONATE CLOTH 2 % EX PADS: 6.0000 | MEDICATED_PAD | Freq: Every day | CUTANEOUS | Status: DC

## 2024-03-06 MED ORDER — POLYETHYLENE GLYCOL 3350 17 G PO PACK: 17.0000 g | PACK | Freq: Every day | ORAL | Status: DC | PRN

## 2024-03-06 MED ORDER — LEVETIRACETAM IN NACL 500 MG/100ML IV SOLN
500.0000 mg | Freq: Two times a day (BID) | INTRAVENOUS | Status: DC
  Filled 2024-03-06: qty 100

## 2024-03-06 MED ORDER — POLYETHYLENE GLYCOL 3350 17 G PO PACK
17.0000 g | PACK | Freq: Every day | ORAL | Status: DC
  Administered 2024-03-07 – 2024-03-09 (×3): 17 g
  Filled 2024-03-06 (×3): qty 1

## 2024-03-06 MED ORDER — ATORVASTATIN CALCIUM 40 MG PO TABS
40.0000 mg | ORAL_TABLET | Freq: Every day | ORAL | Status: DC
  Administered 2024-03-06 – 2024-03-09 (×4): 40 mg
  Filled 2024-03-06 (×4): qty 1

## 2024-03-06 NOTE — ED Provider Notes (Signed)
 [place] EMERGENCY DEPARTMENT AT [facility] Provider Note   CSN: [id number] Arrival date & time: 03/06/24  2025     History  Chief Complaint  Patient presents with   Seizures    Chloe Wilcox is a 89 y.o. female.  This is an 89 year old female brought in by EMS after a seizure.  Per EMS report, patient is a nursing home resident, had been doing fine at 6 PM.  Patient was then noticed to be less responsive, twitching, and looking to the right.  Patient has a history of epilepsy, and I see Keppra in her historical medications, however Keppra was not listed in the patient's nursing home MAR.  Patient continued to have seizure activity in front of EMS, and O2 sats were noted to be low.  They gave 5 mg of IM Versed while they are obtaining an IV.  Seizure persisted, they gave 2.5 of IV Versed.  Patient's seizure aborted, unfortunately patient was no longer protecting her airway.  Patient was full code, so they intubated.   Seizures      Home Medications Prior to Admission medications   Medication Sig Start Date End Date Taking? Authorizing Provider  acetaminophen (TYLENOL) 325 MG tablet Take 650 mg by mouth in the morning, at noon, and at bedtime.    [provider]  albuterol (PROVENTIL) (2.5 MG/3ML) 0.083% nebulizer solution Take 3 mLs (2.5 mg total) by nebulization every 6 (six) hours as needed for wheezing or shortness of breath. Patient not taking: Reported on 10/02/2023 05/01/23   Parrett, Virgel Bouquet, NP  albuterol (VENTOLIN HFA) 108 (90 Base) MCG/ACT inhaler Inhale 2 puffs into the lungs every 6 (six) hours as needed for wheezing or shortness of breath. Patient not taking: Reported on 10/02/2023 05/30/23   Parrett, Virgel Bouquet, NP  aspirin EC 81 MG tablet Take 81 mg by mouth daily. Swallow whole. Patient not taking: Reported on 10/02/2023    [provider]  atorvastatin (LIPITOR) 40 MG tablet Take 40 mg by mouth at bedtime.    [provider]   Budeson-Glycopyrrol-Formoterol (BREZTRI AEROSPHERE) 160-9-4.8 MCG/ACT AERO Inhale 2 puffs into the lungs in the morning and at bedtime. 05/30/23   Parrett, Virgel Bouquet, NP  Cholecalciferol 1.25 MG (50000 UT) capsule Take 50,000 Units by mouth once a week. Takes on Tuesdays    [provider]  citalopram (CELEXA) 10 MG tablet Take 1 tablet (10 mg total) by mouth daily. 07/14/23   Rhetta Mura, MD  docusate sodium (COLACE) 100 MG capsule Take 1 capsule (100 mg total) by mouth 2 (two) times daily. 07/13/23   Rhetta Mura, MD  feeding supplement (ENSURE ENLIVE / ENSURE PLUS) LIQD Take 237 mLs by mouth 3 (three) times daily between meals. 10/10/23   Narda Bonds, MD  ipratropium-albuterol (DUONEB) 0.5-2.5 (3) MG/3ML SOLN Inhale 3 mLs into the lungs in the morning and at bedtime. 06/26/23   [provider]  levETIRAcetam (KEPPRA) 500 MG tablet Take 1 tablet (500 mg total) by mouth 2 (two) times daily. 10/10/23   Narda Bonds, MD  olmesartan (BENICAR) 20 MG tablet Take 1 tablet (20 mg total) by mouth daily. 10/12/23   Zigmund Daniel., MD  thiamine (VITAMIN B-1) 100 MG tablet Take 1 tablet (100 mg total) by mouth daily. 10/11/23   Narda Bonds, MD  traMADol (ULTRAM) 50 MG tablet Take 1 tablet (50 mg total) by mouth every 6 (six) hours as needed for severe pain (pain  score 7-10). 10/10/23   Narda Bonds, MD      Allergies    Patient has no known allergies.    Review of Systems   Review of Systems  Neurological:  Positive for seizures.    Physical Exam Updated Vital Signs BP (!) 181/95   Pulse (!) 102   Temp 97.7 F (36.5 C) (Temporal)   Resp 18   Wt 48 kg   SpO2 99%   BMI 19.35 kg/m  Physical Exam Vitals reviewed.  Constitutional:      Comments: Patient intubated  HENT:     Right Ear: There is no impacted cerumen.     Nose: No congestion.  Eyes:     Pupils: Pupils are equal, round, and reactive to light.  Cardiovascular:     Rate and  Rhythm: Normal rate.  Pulmonary:     Comments: Intubated, clear breath sounds bilaterally Musculoskeletal:        General: No deformity.  Skin:    General: Skin is warm and dry.  Neurological:     Comments: Unable to assess.  Patient not exhibiting any overt seizure activity at this time.     ED Results / Procedures / Treatments   Labs (all labs ordered are listed, but only abnormal results are displayed) Labs Reviewed  COMPREHENSIVE METABOLIC PANEL WITH GFR - Abnormal; Notable for the following components:      Result Value   CO2 33 (*)    Glucose, Bld 116 (*)    Creatinine, Ser 0.40 (*)    Total Protein 6.4 (*)    All other components within normal limits  CBC WITH DIFFERENTIAL/PLATELET - Abnormal; Notable for the following components:   RBC 3.75 (*)    Hemoglobin 10.8 (*)    MCHC 29.5 (*)    Lymphs Abs 0.6 (*)    All other components within normal limits  CBG MONITORING, ED - Abnormal; Notable for the following components:   Glucose-Capillary 113 (*)    All other components within normal limits  I-STAT ARTERIAL BLOOD GAS, ED - Abnormal; Notable for the following components:   pH, Arterial 7.255 (*)    pCO2 arterial 81.0 (*)    pO2, Arterial 304 (*)    Bicarbonate 36.2 (*)    TCO2 39 (*)    Acid-Base Excess 7.0 (*)    HCT 32.0 (*)    Hemoglobin 10.9 (*)    All other components within normal limits  TRIGLYCERIDES  RAPID URINE DRUG SCREEN, HOSP PERFORMED  URINALYSIS, W/ REFLEX TO CULTURE (INFECTION SUSPECTED)    EKG None  Radiology No results found.  Procedures .Critical Care  Performed by: Arletha Pili, DO Authorized by: Arletha Pili, DO   Critical care provider statement:    Critical care time (minutes):  35   Critical care was necessary to treat or prevent imminent or life-threatening deterioration of the following conditions:  Respiratory failure and CNS failure or compromise   Critical care was time spent personally by me on the following  activities:  Development of treatment plan with patient or surrogate, discussions with consultants, evaluation of patient's response to treatment, examination of patient, ordering and review of laboratory studies, ordering and review of radiographic studies, ordering and performing treatments and interventions, pulse oximetry, re-evaluation of patient's condition and review of old charts   Care discussed with: admitting provider       Medications Ordered in ED Medications  propofol (DIPRIVAN) 1000 MG/100ML infusion (30 mcg/kg/min  48  kg Intravenous Rate/Dose Change 03/06/24 2105)  levETIRAcetam (KEPPRA) IVPB 1500 mg/ 100 mL premix (0 mg Intravenous Stopped 03/06/24 2107)    ED Course/ Medical Decision Making/ A&P                                 Medical Decision Making 89 year old female here today after seizure, intubated.  Plan-after confirming patient's airway, ordered labs, expedited CT imaging of patient's head to assess for intracranial hemorrhage.  My dependent review of the patient's head CT shows no intracranial hemorrhage.  I think we have a good story for why the patient seized.  Likely not receiving her Keppra as prescribed.  Patient hypertensive, started on propofol, fentanyl.  Spoke with Dr. Ezzie Dural from neurology, he is arranging EEG.  Will plan to admit patient to ICU.  ABG, labs pending.  Amount and/or Complexity of Data Reviewed Labs: ordered. Radiology: ordered.  Risk Prescription drug management.           Final Clinical Impression(s) / ED Diagnoses Final diagnoses:  Status epilepticus [facility])    Rx / DC Orders ED Discharge Orders     None         Arletha Pili, DO 03/06/24 2151

## 2024-03-06 NOTE — Progress Notes (Signed)
 LTM EEG hooked up and running - no initial skin breakdown - push button tested - [facility] monitoring. HZ and GMD relieved on ECB

## 2024-03-06 NOTE — Progress Notes (Signed)
 Pt transported via ventilator to CT and back w/o complications. RT and RN, NT at bedside.

## 2024-03-06 NOTE — ED Triage Notes (Signed)
 Pt BIB EMS from [facility] with c/o seizures since 1900. LKW was 1800, pt was wheeling around facility. Upon ems arrival, jaw twitching, eyes deviated to the right, and unresponsive. Pt's O2 initially 67% on [place], pt intubated by ems, O2 increased to 100%. Hx of epilepsy, no meds on mar from snf.   5mg  IM versed with no change, 2.5 IV versed then given.  192/100 100HR 80-90 CO2 146 cbg  18 L forearm 18 R AC

## 2024-03-06 NOTE — Progress Notes (Signed)
 eLink Physician-Brief Progress Note Patient Name: Chloe Wilcox DOB: 1935/08/20 MRN: [id number]   Date of Service  03/06/2024  HPI/Events of Note  Patient with a history of epilepsy admitted with seizures, altered mental status, and acute respiratory failure requiring intubation.  eICU Interventions  New Patient Evaluation.        Thomasene Lot Datrell Dunton 03/06/2024, 11:58 PM

## 2024-03-06 NOTE — Consult Note (Signed)
 NEUROLOGY CONSULT NOTE   Date of service: March 06, 2024 Patient Name: Chloe Wilcox MRN:  [id number] DOB:  21-Jan-1935 Chief Complaint: "seizure, intubated" Requesting Provider: Oretha Milch, MD  History of Present Illness  Chloe Wilcox is a 89 y.o. female with hx of COPD on chronic oxygen, depression, hypertension, hyperlipidemia, iron deficiency anemia, right hip fracture, recent diagnosis of seizures in November 2024 and discharged on Keppra 500 mg twice daily.  She she was brought into the ED today after she had a prolonged seizure at her facility.  Per notes, EMS were called after patient was noted to have right gaze deviation along with facial and body twitching and poor responsiveness.  She was given Versed 5 mg IM and then an additional Versed IV 2.5 mg due to continued seizure.  She had to be intubated in the field.  She was brought in and was not noted to have any clinical seizure activity on arrival.  I reviewed facility [facility] and I do not see Keppra or any other antiseizure medication listed on it.  It does not seem like she has been getting Keppra at her facility.  I reviewed the discharge summary from 10/13/2023, and I do see Keppra listed.  It seems that back in November 2024, she had a first-time seizure and she had workup with an MRI brain without contrast and routine EEG.  Her routine EEG was negative for any acute epileptiform discharges.  However, MRI was notable for chronic foci of hemosiderin staining involving bilateral cerebral hemispheres suggesting prior hemorrhages at these locations.  These were felt to be potential seizure focus and she was therefore started and discharged on Keppra 500 mg twice daily.   ROS  Unable to ascertain due to to patient and sedation.  Past History   Past Medical History:  Diagnosis Date   COPD (chronic obstructive pulmonary disease) (HCC)    Depression    Hypertension     Past Surgical History:  Procedure Laterality Date    INTRAMEDULLARY (IM) NAIL INTERTROCHANTERIC Right 07/10/2023   Procedure: INTRAMEDULLARY (IM) NAIL INTERTROCHANTERIC;  Surgeon: Jones Broom, MD;  Location: [facility];  Service: Orthopedics;  Laterality: Right;   VAGINAL HYSTERECTOMY      Family History: Family History  Family history unknown: Yes    Social History  reports that she quit smoking about 40 years ago. Her smoking use included cigarettes. She started smoking about 73 years ago. She has a 33 pack-year smoking history. She has never used smokeless tobacco. She reports that she does not drink alcohol and does not use drugs.  No Known Allergies  Medications   Current Facility-Administered Medications:    aspirin chewable tablet 81 mg, 81 mg, Per Tube, Daily, Desai, Rahul P, PA-C   docusate (COLACE) 50 MG/5ML liquid 100 mg, 100 mg, Per Tube, BID, Desai, Rahul P, PA-C   docusate (COLACE) 50 MG/5ML liquid 100 mg, 100 mg, Per Tube, BID PRN, Oretha Milch, MD   fentaNYL (SUBLIMAZE) injection 25 mcg, 25 mcg, Intravenous, Q15 min PRN, Desai, Rahul P, PA-C   fentaNYL (SUBLIMAZE) injection 25-100 mcg, 25-100 mcg, Intravenous, Q30 min PRN, Desai, Rahul P, PA-C   heparin injection 5,000 Units, 5,000 Units, Subcutaneous, Q8H, Desai, Rahul P, PA-C   ipratropium-albuterol (DUONEB) 0.5-2.5 (3) MG/3ML nebulizer solution 3 mL, 3 mL, Nebulization, Q6H, Desai, Rahul P, PA-C   lactated ringers infusion, , Intravenous, Continuous, Desai, Rahul P, PA-C   midazolam (VERSED) injection 1-2 mg, 1-2 mg, Intravenous, Q1H  PRN, Celine Mans, Rahul P, PA-C   pantoprazole (PROTONIX) injection 40 mg, 40 mg, Intravenous, QHS, Desai, Rahul P, PA-C   polyethylene glycol (MIRALAX / GLYCOLAX) packet 17 g, 17 g, Per Tube, Daily, Desai, Rahul P, PA-C   polyethylene glycol (MIRALAX / GLYCOLAX) packet 17 g, 17 g, Per Tube, Daily PRN, Desai, Rahul P, PA-C   propofol (DIPRIVAN) 1000 MG/100ML infusion, 0-50 mcg/kg/min, Intravenous, Continuous, Anders Simmonds T, DO, Last Rate:  8.64 mL/hr at 03/06/24 2105, 30 mcg/kg/min at 03/06/24 2105  Current Outpatient Medications:    acetaminophen (TYLENOL) 325 MG tablet, Take 650 mg by mouth in the morning, at noon, and at bedtime., Disp: , Rfl:    albuterol (PROVENTIL) (2.5 MG/3ML) 0.083% nebulizer solution, Take 3 mLs (2.5 mg total) by nebulization every 6 (six) hours as needed for wheezing or shortness of breath. (Patient not taking: Reported on 10/02/2023), Disp: 150 mL, Rfl: 5   albuterol (VENTOLIN HFA) 108 (90 Base) MCG/ACT inhaler, Inhale 2 puffs into the lungs every 6 (six) hours as needed for wheezing or shortness of breath. (Patient not taking: Reported on 10/02/2023), Disp: 18 g, Rfl: 2   aspirin EC 81 MG tablet, Take 81 mg by mouth daily. Swallow whole. (Patient not taking: Reported on 10/02/2023), Disp: , Rfl:    atorvastatin (LIPITOR) 40 MG tablet, Take 40 mg by mouth at bedtime., Disp: , Rfl:    Budeson-Glycopyrrol-Formoterol (BREZTRI AEROSPHERE) 160-9-4.8 MCG/ACT AERO, Inhale 2 puffs into the lungs in the morning and at bedtime., Disp: 10.7 g, Rfl: 5   Cholecalciferol 1.25 MG (50000 UT) capsule, Take 50,000 Units by mouth once a week. Takes on Tuesdays, Disp: , Rfl:    citalopram (CELEXA) 10 MG tablet, Take 1 tablet (10 mg total) by mouth daily., Disp: 12 tablet, Rfl: 0   docusate sodium (COLACE) 100 MG capsule, Take 1 capsule (100 mg total) by mouth 2 (two) times daily., Disp: , Rfl:    feeding supplement (ENSURE ENLIVE / ENSURE PLUS) LIQD, Take 237 mLs by mouth 3 (three) times daily between meals., Disp: , Rfl:    ipratropium-albuterol (DUONEB) 0.5-2.5 (3) MG/3ML SOLN, Inhale 3 mLs into the lungs in the morning and at bedtime., Disp: , Rfl:    levETIRAcetam (KEPPRA) 500 MG tablet, Take 1 tablet (500 mg total) by mouth 2 (two) times daily., Disp: , Rfl:    olmesartan (BENICAR) 20 MG tablet, Take 1 tablet (20 mg total) by mouth daily., Disp: , Rfl:    thiamine (VITAMIN B-1) 100 MG tablet, Take 1 tablet (100 mg total) by  mouth daily., Disp: , Rfl:    traMADol (ULTRAM) 50 MG tablet, Take 1 tablet (50 mg total) by mouth every 6 (six) hours as needed for severe pain (pain score 7-10)., Disp: 5 tablet, Rfl: 0  Vitals   Vitals:   03/06/24 2030 03/06/24 2040 03/06/24 2040 03/06/24 2100  BP: (!) 201/124   (!) 181/95  Pulse: (!) 108 (!) 107  (!) 102  Resp: 14 20  18   Temp:   97.7 F (36.5 C)   TempSrc:   Temporal   SpO2: (!) 86% 100%  99%  Weight:        Body mass index is 19.35 kg/m.  Physical Exam   General: Appears frail and old, laying comfortably in bed; in no acute distress.  HENT: Normal oropharynx and mucosa. Normal external appearance of ears and nose.  Neck: Supple, no pain or tenderness  CV: No JVD. No peripheral edema.  Pulmonary: Symmetric  Chest rise.  She is breathing over the vent. Abdomen: Soft to touch, non-tender.  Ext: No cyanosis, edema, or deformity  Skin: No rash. Normal palpation of skin.   Musculoskeletal: Normal digits and nails by inspection. No clubbing.   Neurologic Examination  Mental status/Cognition: Obtunded, no response to loud voice or clap.  Does have symmetric facial grimace noted to noxious stimuli.   Speech/language: Mute, does not follow commands or answer any questions.  No attempts to communicate.  Cranial nerves:   CN II Pupils equal and reactive to light, unable to assess for visual field deficits.   CN III,IV,VI EOMI intact to doll's eye, no gaze preference or deviation, no nystagmus.   CN V Corneals are intact bilaterally.   CN VII Symmetric facial grimace noted to noxious stimuli.   CN VIII She does not turn her head towards the speech.   CN IX & X Cough and gag are intact.   CN XI Head is midline, she does move her head left to right to noxious stimuli.   CN XII She does not protrude her tongue on command.   Sensory/motor:  Muscle bulk: Very poor, tone flaccid She withdraws bilateral upper extremities to noxious stimuli. She withdraws bilateral  lower extremities to Babinski's.  Coordination/Complex Motor:  Unable to assess. Labs/Imaging/Neurodiagnostic studies   CBC:  Recent Labs  Lab 03/16/24 2103 2024-03-16 2104  WBC  --  7.9  NEUTROABS  --  6.8  HGB 10.9* 10.8*  HCT 32.0* 36.6  MCV  --  97.6  PLT  --  265   Basic Metabolic Panel:  Lab Results  Component Value Date   NA 144 03/16/24   K 4.1 03-16-24   CO2 33 (H) 03/16/24   GLUCOSE 116 (H) 03/16/2024   BUN 17 Mar 16, 2024   CREATININE 0.40 (L) 2024-03-16   CALCIUM 9.6 2024/03/16   GFRNONAA >60 03/16/2024   GFRAA >60 07/25/2020   Lipid Panel:  Lab Results  Component Value Date   LDLCALC 42 10/12/2023   HgbA1c:  Lab Results  Component Value Date   HGBA1C 4.8 10/12/2023   Urine Drug Screen:     Component Value Date/Time   LABOPIA NONE DETECTED 10/02/2023 2308   COCAINSCRNUR NONE DETECTED 10/02/2023 2308   LABBENZ NONE DETECTED 10/02/2023 2308   AMPHETMU NONE DETECTED 10/02/2023 2308   THCU NONE DETECTED 10/02/2023 2308   LABBARB NONE DETECTED 10/02/2023 2308    Alcohol Level No results found for: "ETH" INR  Lab Results  Component Value Date   INR 1.2 07/10/2023   APTT No results found for: "APTT" AED levels: No results found for: "PHENYTOIN", "ZONISAMIDE", "LAMOTRIGINE", "LEVETIRACETA"  CT Head without contrast(Personally reviewed): CTH was negative for a large hypodensity concerning for a large territory infarct or hyperdensity concerning for an ICH  Neurodiagnostics cEEG:  Pending  ASSESSMENT   Chloe Wilcox is a 89 y.o. female with hx of COPD on chronic oxygen, depression, hypertension, hyperlipidemia, iron deficiency anemia, right hip fracture, recent diagnosis of seizures in November 2024 and discharged on Keppra 500 mg twice daily.  She she was brought into the ED today after she had a prolonged seizure at her facility.  Her seizure resolved clinically after 7.5 mg of Versed in the field, she had to be intubated after that for  inability to protect her airway.  She was discharged on Keppra 500 mg twice daily after she presented with first-time seizure back in November 2024.  However, reviewing facility [facility] list,  I do not see that she is on Keppra at her facility.  She is currently nonfebrile, no obvious nuchal rigidity, vitals are not suggestive of meningitis or sepsis in general, labs with no leukocytosis.  My overall suspicion for meningitis is pretty low.  Chemistry with no significant electrolyte abnormalities.  Etiology of her prolonged seizure is probably secondary to not being on AEDs.  RECOMMENDATIONS  - Agree with Keppra load of 1500 mg IV once. - Start Keppra 500 mg twice daily - Continue propofol at 40 until she is hooked up on continuous EEG. - Continuous EEG ordered.  Further AED type ration based on continuous EEG. -  ______________________________________________________________________  This patient is critically ill and at significant risk of neurological worsening, death and care requires constant monitoring of vital signs, hemodynamics,respiratory and cardiac monitoring, neurological assessment, discussion with family, other specialists and medical decision making of high complexity. I spent 40 minutes of neurocritical care time  in the care of  this patient. This was time spent independent of any time provided by nurse practitioner or PA.  Plan was discussed with EDP Dr. Andria Meuse and with patient's goddaughter at the bedside.  Erick Blinks [facility] 03/06/2024  10:20 PM   Signed, Erick Blinks, MD [facility]

## 2024-03-06 NOTE — H&P (Signed)
 NAME:  Chloe Wilcox, MRN:  [id number], DOB:  1935-08-23, LOS: 0 ADMISSION DATE:  03/06/2024, CONSULTATION DATE:  03/06/24 REFERRING MD:  Andria Meuse CHIEF COMPLAINT:  Seizures   History of Present Illness:  Pt is encephelopathic; therefore, this HPI is obtained from chart review. Chloe Wilcox is a 89 y.o. female who has a PMH as below including but not limited to epilepsy, cognitive deficits, failure to thrive and who resides at a nursing home. She has a diagnosis of epilepsy and was supposed to be on Keppra 500mg  BID (listed on discharge summary from 10/02/23); however, she was apparently not on this at nursing facility for unclear reasons. She was seen normal at 6pm on evening of 03/06/24. When staff checked on her later, she was less responsive and was twitching with a right sided gaze.  EMS was called and administered IM Versed without improvement before establishing IV access and administering an additional Versed dose IV. There was roughly 45 minutes reported before seizures resolved.  She was brought to [facility] ED where she required intubation for airway protection. She had head CT which preliminary is negative for bleed. She was loaded with 1.5g Keppra and was seen by neurology who has started LTM. She is currently on a propofol infusion.  PCCM called for ICU admission.  Pertinent  Medical History:  has Acute respiratory failure with hypoxia (HCC); COPD (chronic obstructive pulmonary disease) (HCC); COPD with acute bronchitis (HCC); Hypokalemia; Essential hypertension; Respiratory failure (HCC); Hypoxemia; History of COVID-19; Abnormal findings on diagnostic imaging of lung; Closed displaced intertrochanteric fracture of right femur (HCC); Fall; Depression; Hyperlipidemia; Anemia of chronic disease; Acute metabolic encephalopathy; Protein-calorie malnutrition, severe; and Status epilepticus (HCC) on their problem list.  Significant Hospital Events: Including procedures, antibiotic start and stop dates  in addition to other pertinent events   4/9 admit.  Interim History / Subjective:  Sedated on Propofol. Not responsive. Family member at bedside.  Objective:  Blood pressure (!) 181/95, pulse (!) 102, temperature 97.7 F (36.5 C), temperature source Temporal, resp. rate 18, weight 48 kg, SpO2 99%.    Vent Mode: PRVC FiO2 (%):  [60 %] 60 % Set Rate:  [16 bmp-18 bmp] 18 bmp Vt Set:  [400 mL] 400 mL PEEP:  [5 cmH20] 5 cmH20 Plateau Pressure:  [20 cmH20] 20 cmH20   Intake/Output Summary (Last 24 hours) at 03/06/2024 2208 Last data filed at 03/06/2024 2107 Gross per 24 hour  Intake 100 ml  Output --  Net 100 ml   Filed Weights   03/06/24 2000  Weight: 48 kg    Examination: General: Elderly female, frail, chronically ill appearing, in NAD. Neuro: Sedated, not responsive. HEENT: [place]/AT. Sclerae anicteric. ETT in place. Cardiovascular: RRR, no M/R/G.  Lungs: Respirations even and unlabored.  CTA bilaterally, No W/R/R. Abdomen: BS x 4, soft, NT/ND.  Musculoskeletal: Muscle wasting throughout. No gross deformities, no edema.  Skin: Intact, warm, no rashes.   Labs/imaging personally reviewed:  CT head 4/9 >  EEG 4/9 >  MRI brain 4/10 >   Assessment & Plan:   Seizures with underlying hx Epilepsy - was supposed to be on Keppra 500mg  BID but was not on this at nursing facility for unclear reasons. - Neurology on board, appreciate the assistance. - LTM per neuro. - Further AED's per neuro (loaded with 1500mg  Keppra in ED). - Brain MRI.  Acute hypoxic respiratory failure with inability to protect the airway - 2/2 above. - Full vent support. - Wean as mental  status allows. - Bronchial hygiene. - Repeat ABG tonight after vent adjusted in ED following intubation. - CXR intermittently.]  Hx COPD. - Budesonide/Brovana/Yupelri in lieu of PTA Breztri. - PRN Albuterol.  Hx HTN. - Continue PTA ASA, Atorvastatin. - Hold PTA Olmesartan.  Hx Depression, Cognitive Deficits, FTT,  Protein Calorie Malnutrition. - Ongoing goals of care discussions are needed. I recommended DNR in the event of an arrest as this would certainly cause further harm given her frailty. Family understands and will have further discussions. Recommend ongoing supportive care otherwise. - Hold PTA Celexa.   Best practice (evaluated daily):  Diet/type: NPO DVT prophylaxis: prophylactic heparin  Pressure ulcer(s): pressure ulcer assessment deferred  GI prophylaxis: PPI Lines: N/A Foley:  N/A Code Status:  full code Last date of multidisciplinary goals of care discussion: None yet.  Labs   CBC: Recent Labs  Lab 03/06/24 2103 03/06/24 2104  WBC  --  7.9  NEUTROABS  --  6.8  HGB 10.9* 10.8*  HCT 32.0* 36.6  MCV  --  97.6  PLT  --  265    Basic Metabolic Panel: Recent Labs  Lab 03/06/24 2103 03/06/24 2104  NA 143 144  K 4.0 4.1  CL  --  105  CO2  --  33*  GLUCOSE  --  116*  BUN  --  17  CREATININE  --  0.40*  CALCIUM  --  9.6   GFR: Estimated Creatinine Clearance: 38.3 mL/min (A) (by C-G formula based on SCr of 0.4 mg/dL (L)). Recent Labs  Lab 03/06/24 2104  WBC 7.9    Liver Function Tests: Recent Labs  Lab 03/06/24 2104  AST 18  ALT 11  ALKPHOS 44  BILITOT 0.5  PROT 6.4*  ALBUMIN 3.8   No results for input(s): "LIPASE", "AMYLASE" in the last 168 hours. No results for input(s): "AMMONIA" in the last 168 hours.  ABG    Component Value Date/Time   PHART 7.255 (L) 03/06/2024 2103   PCO2ART 81.0 (HH) 03/06/2024 2103   PO2ART 304 (H) 03/06/2024 2103   HCO3 36.2 (H) 03/06/2024 2103   TCO2 39 (H) 03/06/2024 2103   O2SAT 100 03/06/2024 2103     Coagulation Profile: No results for input(s): "INR", "PROTIME" in the last 168 hours.  Cardiac Enzymes: No results for input(s): "CKTOTAL", "CKMB", "CKMBINDEX", "TROPONINI" in the last 168 hours.  HbA1C: Hgb A1c MFr Bld  Date/Time Value Ref Range Status  10/12/2023 07:37 AM 4.8 4.8 - 5.6 % Final    Comment:     (NOTE) Pre diabetes:          5.7%-6.4%  Diabetes:              >6.4%  Glycemic control for   <7.0% adults with diabetes   10/03/2023 12:23 AM 4.7 (L) 4.8 - 5.6 % Final    Comment:    (NOTE) Pre diabetes:          5.7%-6.4%  Diabetes:              >6.4%  Glycemic control for   <7.0% adults with diabetes     CBG: Recent Labs  Lab 03/06/24 2029  GLUCAP 113*    Review of Systems:   Unable to obtain as pt is encephalopathic.  Past Medical History:  She,  has a past medical history of COPD (chronic obstructive pulmonary disease) (HCC), Depression, and Hypertension.   Surgical History:   Past Surgical History:  Procedure Laterality Date  INTRAMEDULLARY (IM) NAIL INTERTROCHANTERIC Right 07/10/2023   Procedure: INTRAMEDULLARY (IM) NAIL INTERTROCHANTERIC;  Surgeon: Jones Broom, MD;  Location: [facility];  Service: Orthopedics;  Laterality: Right;   VAGINAL HYSTERECTOMY       Social History:   reports that she quit smoking about 40 years ago. Her smoking use included cigarettes. She started smoking about 73 years ago. She has a 33 pack-year smoking history. She has never used smokeless tobacco. She reports that she does not drink alcohol and does not use drugs.   Family History:  Her Family history is unknown by patient.   Allergies No Known Allergies   Home Medications  Prior to Admission medications   Medication Sig Start Date End Date Taking? Authorizing Provider  acetaminophen (TYLENOL) 325 MG tablet Take 650 mg by mouth in the morning, at noon, and at bedtime.    [provider]  albuterol (PROVENTIL) (2.5 MG/3ML) 0.083% nebulizer solution Take 3 mLs (2.5 mg total) by nebulization every 6 (six) hours as needed for wheezing or shortness of breath. Patient not taking: Reported on 10/02/2023 05/01/23   Parrett, Virgel Bouquet, NP  albuterol (VENTOLIN HFA) 108 (90 Base) MCG/ACT inhaler Inhale 2 puffs into the lungs every 6 (six) hours as needed for wheezing or  shortness of breath. Patient not taking: Reported on 10/02/2023 05/30/23   Parrett, Virgel Bouquet, NP  aspirin EC 81 MG tablet Take 81 mg by mouth daily. Swallow whole. Patient not taking: Reported on 10/02/2023    [provider]  atorvastatin (LIPITOR) 40 MG tablet Take 40 mg by mouth at bedtime.    [provider]  Budeson-Glycopyrrol-Formoterol (BREZTRI AEROSPHERE) 160-9-4.8 MCG/ACT AERO Inhale 2 puffs into the lungs in the morning and at bedtime. 05/30/23   Parrett, Virgel Bouquet, NP  Cholecalciferol 1.25 MG (50000 UT) capsule Take 50,000 Units by mouth once a week. Takes on Tuesdays    [provider]  citalopram (CELEXA) 10 MG tablet Take 1 tablet (10 mg total) by mouth daily. 07/14/23   Rhetta Mura, MD  docusate sodium (COLACE) 100 MG capsule Take 1 capsule (100 mg total) by mouth 2 (two) times daily. 07/13/23   Rhetta Mura, MD  feeding supplement (ENSURE ENLIVE / ENSURE PLUS) LIQD Take 237 mLs by mouth 3 (three) times daily between meals. 10/10/23   Narda Bonds, MD  ipratropium-albuterol (DUONEB) 0.5-2.5 (3) MG/3ML SOLN Inhale 3 mLs into the lungs in the morning and at bedtime. 06/26/23   [provider]  levETIRAcetam (KEPPRA) 500 MG tablet Take 1 tablet (500 mg total) by mouth 2 (two) times daily. 10/10/23   Narda Bonds, MD  olmesartan (BENICAR) 20 MG tablet Take 1 tablet (20 mg total) by mouth daily. 10/12/23   Zigmund Daniel., MD  thiamine (VITAMIN B-1) 100 MG tablet Take 1 tablet (100 mg total) by mouth daily. 10/11/23   Narda Bonds, MD  traMADol (ULTRAM) 50 MG tablet Take 1 tablet (50 mg total) by mouth every 6 (six) hours as needed for severe pain (pain score 7-10). 10/10/23   Narda Bonds, MD     Critical care time: 40 min.   Rutherford Guys, PA - C [facility] For pager details, please see AMION or use Epic chat  After 1900, please call [facility] for cross coverage needs 03/06/2024, 10:08 PM

## 2024-03-07 ENCOUNTER — Other Ambulatory Visit: Payer: Self-pay

## 2024-03-07 DIAGNOSIS — J9622 Acute and chronic respiratory failure with hypercapnia: Secondary | ICD-10-CM

## 2024-03-07 DIAGNOSIS — J9621 Acute and chronic respiratory failure with hypoxia: Secondary | ICD-10-CM | POA: Diagnosis not present

## 2024-03-07 DIAGNOSIS — I10 Essential (primary) hypertension: Secondary | ICD-10-CM

## 2024-03-07 DIAGNOSIS — G9340 Encephalopathy, unspecified: Secondary | ICD-10-CM

## 2024-03-07 DIAGNOSIS — E785 Hyperlipidemia, unspecified: Secondary | ICD-10-CM

## 2024-03-07 DIAGNOSIS — G40901 Epilepsy, unspecified, not intractable, with status epilepticus: Secondary | ICD-10-CM | POA: Diagnosis not present

## 2024-03-07 DIAGNOSIS — E46 Unspecified protein-calorie malnutrition: Secondary | ICD-10-CM

## 2024-03-07 LAB — BASIC METABOLIC PANEL WITH GFR
Anion gap: 13 (ref 5–15)
BUN: 15 mg/dL (ref 8–23)
CO2: 27 mmol/L (ref 22–32)
Calcium: 9.8 mg/dL (ref 8.9–10.3)
Chloride: 102 mmol/L (ref 98–111)
Creatinine, Ser: 0.38 mg/dL — ABNORMAL LOW (ref 0.44–1.00)
GFR, Estimated: >60 mL/min (ref >60)
Glucose, Bld: 85 mg/dL (ref 70–99)
Potassium: 3.9 mmol/L (ref 3.5–5.1)
Sodium: 142 mmol/L (ref 135–145)

## 2024-03-07 LAB — CBC
HCT: 31.1 % — ABNORMAL LOW (ref 36.0–46.0)
Hemoglobin: 9.5 g/dL — ABNORMAL LOW (ref 12.0–15.0)
MCH: 29.2 pg (ref 26.0–34.0)
MCHC: 30.5 g/dL (ref 30.0–36.0)
MCV: 95.7 fL (ref 80.0–100.0)
Platelets: 237 10*3/uL (ref 150–400)
RBC: 3.25 MIL/uL — ABNORMAL LOW (ref 3.87–5.11)
RDW: 12.9 % (ref 11.5–15.5)
WBC: 8.5 10*3/uL (ref 4.0–10.5)
nRBC: 0 % (ref 0.0–0.2)

## 2024-03-07 LAB — GLUCOSE, CAPILLARY
Glucose-Capillary: 101 mg/dL — ABNORMAL HIGH (ref 70–99)
Glucose-Capillary: 122 mg/dL — ABNORMAL HIGH (ref 70–99)
Glucose-Capillary: 56 mg/dL — ABNORMAL LOW (ref 70–99)
Glucose-Capillary: 67 mg/dL — ABNORMAL LOW (ref 70–99)
Glucose-Capillary: 70 mg/dL (ref 70–99)
Glucose-Capillary: 75 mg/dL (ref 70–99)
Glucose-Capillary: 87 mg/dL (ref 70–99)
Glucose-Capillary: 88 mg/dL (ref 70–99)

## 2024-03-07 LAB — MAGNESIUM: Magnesium: 1.7 mg/dL (ref 1.7–2.4)

## 2024-03-07 LAB — URINE CULTURE

## 2024-03-07 LAB — MRSA NEXT GEN BY PCR, NASAL: MRSA by PCR Next Gen: DETECTED — AB

## 2024-03-07 LAB — PHOSPHORUS: Phosphorus: 2.7 mg/dL (ref 2.5–4.6)

## 2024-03-07 LAB — TRIGLYCERIDES: Triglycerides: 54 mg/dL (ref <150)

## 2024-03-07 MED ORDER — CHLORHEXIDINE GLUCONATE CLOTH 2 % EX PADS
6.0000 | MEDICATED_PAD | Freq: Every day | CUTANEOUS | Status: DC
  Administered 2024-03-08 – 2024-03-16 (×8): 6 via TOPICAL

## 2024-03-07 MED ORDER — LEVETIRACETAM IN NACL 1000 MG/100ML IV SOLN: 1000.0000 mg | Freq: Two times a day (BID) | INTRAVENOUS | Status: DC

## 2024-03-07 MED ORDER — THIAMINE MONONITRATE 100 MG PO TABS
100.0000 mg | ORAL_TABLET | Freq: Every day | ORAL | Status: DC
  Administered 2024-03-07 – 2024-03-10 (×4): 100 mg
  Filled 2024-03-07 (×4): qty 1

## 2024-03-07 MED ORDER — SODIUM CHLORIDE 0.9 % IV SOLN
750.0000 mg | Freq: Two times a day (BID) | INTRAVENOUS | Status: DC
  Administered 2024-03-08 (×2): 750 mg via INTRAVENOUS
  Filled 2024-03-07 (×7): qty 7.5

## 2024-03-07 MED ORDER — CITALOPRAM HYDROBROMIDE 10 MG PO TABS
10.0000 mg | ORAL_TABLET | Freq: Every day | ORAL | Status: DC
Start: 2024-03-07 — End: 2024-03-10
  Administered 2024-03-07 – 2024-03-10 (×4): 10 mg
  Filled 2024-03-07 (×4): qty 1

## 2024-03-07 MED ORDER — MAGNESIUM SULFATE 2 GM/50ML IV SOLN
2.0000 g | Freq: Once | INTRAVENOUS | Status: AC
  Administered 2024-03-07: 2 g via INTRAVENOUS
  Filled 2024-03-07: qty 50

## 2024-03-07 MED ORDER — IRBESARTAN 150 MG PO TABS
150.0000 mg | ORAL_TABLET | Freq: Every day | ORAL | Status: DC
  Administered 2024-03-07 – 2024-03-10 (×4): 150 mg
  Filled 2024-03-07 (×4): qty 1

## 2024-03-07 MED ORDER — LEVETIRACETAM IN NACL 500 MG/100ML IV SOLN
500.0000 mg | Freq: Once | INTRAVENOUS | Status: AC
Start: 2024-03-07 — End: 2024-03-07
  Administered 2024-03-07: 500 mg via INTRAVENOUS

## 2024-03-07 MED ORDER — POTASSIUM & SODIUM PHOSPHATES 280-160-250 MG PO PACK
2.0000 | PACK | Freq: Once | ORAL | Status: AC
  Administered 2024-03-07: 2
  Filled 2024-03-07: qty 2

## 2024-03-07 MED ORDER — MUPIROCIN 2 % EX OINT
1.0000 | TOPICAL_OINTMENT | Freq: Two times a day (BID) | CUTANEOUS | Status: AC
Start: 2024-03-07 — End: 2024-03-11
  Administered 2024-03-07 – 2024-03-11 (×8): 1 via NASAL
  Filled 2024-03-07 (×2): qty 22

## 2024-03-07 MED ORDER — LEVETIRACETAM 500 MG PO TABS: 1000.0000 mg | ORAL_TABLET | Freq: Two times a day (BID) | ORAL | Status: DC

## 2024-03-07 MED ORDER — DEXTROSE 50 % IV SOLN
12.5000 g | INTRAVENOUS | Status: AC
  Administered 2024-03-07: 12.5 g via INTRAVENOUS

## 2024-03-07 MED ORDER — AMLODIPINE BESYLATE 10 MG PO TABS
10.0000 mg | ORAL_TABLET | Freq: Every day | ORAL | Status: DC
  Administered 2024-03-07 – 2024-03-10 (×4): 10 mg
  Filled 2024-03-07 (×4): qty 1

## 2024-03-07 MED ORDER — LEVETIRACETAM 750 MG PO TABS
750.0000 mg | ORAL_TABLET | Freq: Two times a day (BID) | ORAL | Status: DC
  Administered 2024-03-07 – 2024-03-10 (×4): 750 mg
  Filled 2024-03-07 (×6): qty 1

## 2024-03-07 MED ORDER — ACETAMINOPHEN 325 MG PO TABS
650.0000 mg | ORAL_TABLET | Freq: Four times a day (QID) | ORAL | Status: DC | PRN
  Administered 2024-03-07: 650 mg
  Filled 2024-03-07: qty 2

## 2024-03-07 MED ORDER — DEXTROSE 50 % IV SOLN
12.5000 g | INTRAVENOUS | Status: AC
  Administered 2024-03-07: 12.5 g via INTRAVENOUS
  Filled 2024-03-07: qty 50

## 2024-03-07 MED ORDER — HYDRALAZINE HCL 20 MG/ML IJ SOLN
10.0000 mg | INTRAMUSCULAR | Status: DC | PRN
  Filled 2024-03-07: qty 1

## 2024-03-07 MED ORDER — METOPROLOL TARTRATE 5 MG/5ML IV SOLN
INTRAVENOUS | Status: AC
  Filled 2024-03-07: qty 5

## 2024-03-07 MED ORDER — METOPROLOL TARTRATE 5 MG/5ML IV SOLN
2.5000 mg | INTRAVENOUS | Status: DC | PRN
  Administered 2024-03-07 – 2024-03-13 (×2): 5 mg via INTRAVENOUS
  Filled 2024-03-07: qty 5

## 2024-03-07 MED ORDER — OSMOLITE 1.2 CAL PO LIQD
1000.0000 mL | ORAL | Status: DC
  Administered 2024-03-07 – 2024-03-31 (×26): 1000 mL
  Filled 2024-03-07 (×2): qty 1000

## 2024-03-07 NOTE — Progress Notes (Signed)
 Pt transported to via ventilator by RT and RN x2 w/o complication. VSS throughout.

## 2024-03-07 NOTE — Progress Notes (Signed)
 Subjective: family at bedside. Reportedly patient was not taking/getting keppra as prescribed.   ROS: Unable to obtain due to poor mental status  Examination  Vital signs in last 24 hours: Temp:  [97.7 F (36.5 C)-99.9 F (37.7 C)] 99.9 F (37.7 C) (04/10 1530) Pulse Rate:  [66-112] 76 (04/10 1530) Resp:  [14-22] 19 (04/10 1530) BP: (111-201)/(54-136) 171/70 (04/10 1530) SpO2:  [86 %-100 %] 98 % (04/10 1530) FiO2 (%):  [40 %-60 %] 40 % (04/10 1505) Weight:  [41.8 kg-48 kg] 41.8 kg (04/10 0500)  General: lying in bed, intubated Neuro: comatose, on prop @35mcg /kg/min, winces to noxious stimuli, doesn't follow commands, PERLA, corneal reflex intact, unable to assess cough reflex, withdraws to noxious stimuli in all extremities except RUE  Basic Metabolic Panel: Recent Labs  Lab 03/06/24 2103 03/06/24 2104 03/06/24 2238 03/07/24 0622  NA 143 144 142 142  K 4.0 4.1 4.0 3.9  CL  --  105  --  102  CO2  --  33*  --  27  GLUCOSE  --  116*  --  85  BUN  --  17  --  15  CREATININE  --  0.40*  --  0.38*  CALCIUM  --  9.6  --  9.8  MG  --   --   --  1.7  PHOS  --   --   --  2.7    CBC: Recent Labs  Lab 03/06/24 2103 03/06/24 2104 03/06/24 2238 03/07/24 0622  WBC  --  7.9  --  8.5  NEUTROABS  --  6.8  --   --   HGB 10.9* 10.8* 10.5* 9.5*  HCT 32.0* 36.6 31.0* 31.1*  MCV  --  97.6  --  95.7  PLT  --  265  --  237     Coagulation Studies: No results for input(s): "LABPROT", "INR" in the last 72 hours.  Imaging personally reviewed  CTH wo contrast 03/06/2024: No acute intracranial abnormality.   ASSESSMENT AND PLAN: 89 y.o. female with hx of COPD on chronic oxygen, depression, hypertension, hyperlipidemia, iron deficiency anemia, right hip fracture, recent diagnosis of seizures in November 2024 and discharged on Keppra 500 mg twice daily.  She she was brought into the ED today after she had a prolonged seizure at her facility.  Her seizure resolved clinically after 7.5 mg  of Versed in the field, she had to be intubated after that for inability to protect her airway.   She was discharged on Keppra 500 mg twice daily after she presented with first-time seizure back in November 2024.  However, reviewing facility [facility] list, I do not see that she is on Keppra at her facility.  Epilepsy with breakthrough seizure -  left LPDS on eeg but no seizures  Recommendations - Increase keppra tpo 750mg  BID ( max dose for creatine clearance) -  Wem prop at 61mcg/hr to stop - Will add vimpat if seizure recur - Continue eeg overnight, can dc tomorrow if no sz - Continue seizure precautions - PRN IV ativan for clinical seizures - Discussed plan with family and ICU team  CRITICAL CARE Performed by: Charlsie Quest   Total critical care time: 36 minutes  Critical care time was exclusive of separately billable procedures and treating other patients.  Critical care was necessary to treat or prevent imminent or life-threatening deterioration.  Critical care was time spent personally by me on the following activities: development of treatment plan with patient and/or surrogate  as well as nursing, discussions with consultants, evaluation of patient's response to treatment, examination of patient, obtaining history from patient or surrogate, ordering and performing treatments and interventions, ordering and review of laboratory studies, ordering and review of radiographic studies, pulse oximetry and re-evaluation of patient's condition.     Lindie Spruce Epilepsy [facility] For questions after 5pm please refer to AMION to reach the Neurologist on call

## 2024-03-07 NOTE — Procedures (Signed)
 Patient Name: Chloe Wilcox  MRN: [id number]  Epilepsy Attending: Charlsie Quest  Referring Physician/Provider: Erick Blinks, MD  Duration: 03/06/2024 2228 to 03/07/2024 2228  Patient history:  89 y.o. female with recent diagnosis of seizures in November 2024 and discharged on Keppra 500 mg twice daily.  She she was brought into the ED today after she had a prolonged seizure at her facility.  Her seizure resolved clinically after 7.5 mg of Versed in the field, she had to be intubated after that for inability to protect her airway. EEG to evaluate for seizure  Level of alertness: comatose  AEDs during EEG study: LEV, propofol  Technical aspects: This EEG study was done with scalp electrodes positioned according to the 10-20 International system of electrode placement. Electrical activity was reviewed with band pass filter of 1-70Hz , sensitivity of 7 uV/mm, display speed of 54mm/sec with a 60Hz  notched filter applied as appropriate. EEG data were recorded continuously and digitally stored.  Video monitoring was available and reviewed as appropriate.  Description: EEG showed continuous generalized 3 to 6 Hz theta-delta slowing with overriding 13-15hz  beta activity.  Hyperventilation and photic stimulation were not performed.     Intermittently throughout the study, lateralized periodic discharges with overriding fast activity were noted in left hemisphere at 0.75 to 1 Hz, at times with overlying rhythmicity.   Event button was pressed on 03/07/2024 at 0228 for LEFT upper extremity jerking and oxygen desaturation.  Concomitant EEG before, during and after the event did not show any definite EEG change.  Event button was pressed on 03/07/2024 at 1742 for RIGHT upper extremity tremor like movement. Concomitant EEG before, during and after the event did not show any definite EEG change.  ABNORMALITY - Lateralized periodic discharges with overriding fast activity and rhythmicity, left hemisphere (  LPD+F+R) - Continuous slow, generalized  IMPRESSION: This study showed evidence of epileptogenicity arising from left hemisphere.  This EEG pattern is on the ictal-interictal continuum with increased risk of seizure recurrence.  Additionally there is moderate diffuse encephalopathy, likely related to sedation  Event button was pressed on 03/07/2024 at 0228 for LEFT upper extremity jerking and oxygen desaturation without concomitant EEG change.  However focal motor seizures may not be seen on scalp EEG.  Clinical correlation is recommended.  Event button was pressed on 03/07/2024 at 1742 for RIGHT upper extremity tremor like movement without concomitant EEG change. This was most likely NOT an epileptic event.  [name] Annabelle Harman

## 2024-03-07 NOTE — Progress Notes (Signed)
 Initial Nutrition Assessment  DOCUMENTATION CODES:  Underweight, Severe malnutrition in context of social or environmental circumstances  INTERVENTION:  Initiate tube feeding via OGT: Osmolite 1.2 at 50 ml/h (1200 ml per day) Start at 51mL/h and advance by 10mL q12h to goal Provides 1440 kcal, 67 gm protein, 984 ml free water daily Monitor magnesium and phosphorus daily x 3 occurrences, MD to replete as needed, as pt is at risk for refeeding syndrome given severe malnutrition and underweight BMI Thiamine 100mg  x 7 days  NUTRITION DIAGNOSIS:  Severe Malnutrition related to social / environmental circumstances (communal living, poor functional status) as evidenced by severe fat depletion, severe muscle depletion.  GOAL:  Patient will meet greater than or equal to 90% of their needs  MONITOR:  TF tolerance, I & O's, Vent status, Labs  REASON FOR ASSESSMENT:  Ventilator, Consult Enteral/tube feeding initiation and management  ASSESSMENT:  Pt with hx of HTN, HLD, and COPD on home O2 presented to ED from her facility with seizure-like activity.  4/9 - presented to ED from facility, intubated   Patient is currently intubated on ventilator support. Two visitors present at bedside able to provide some hx. State that pt has lost a lot of weight over the last few years. One visitor is a family friend whose grandmother is the pt's roommate at the nursing facility. She states that she sees pt at some meals and states that pt will eat, but does need encouragement to eat adequate amounts. Poor dentition noted. Visitors report that chewing is sometimes an issue due to pt's teeth but that she has not complained of swallowing issues.   Pt with significant loss of fat and muscle. Meets criteria for malnutrition. Discussed with RN and provider. Will initiate nutrition but pt is at risk for refeeding. Will advance slowly.   MV: 4.8 L/min Temp (24hrs), Avg:99 F (37.2 C), Min:97.7 F (36.5 C),  Max:99.5 F (37.5 C) MAP (cuff):  Propofol: 11.52 ml/hr (304 kcal/d)  Admit weight: 48 kg ? Accuracy, appears copied from last admission  Current weight: 41.8 kg    Intake/Output Summary (Last 24 hours) at 03/07/2024 1458 Last data filed at 03/07/2024 1300 Gross per 24 hour  Intake 1764.53 ml  Output 225 ml  Net 1539.53 ml  Net IO Since Admission: 1,539.53 mL [03/07/24 1458]  Drains/Lines: OGT, 14 Fr. (Gastric) UOP since admission  Nutritionally Relevant Medications: Scheduled Meds:  atorvastatin  40 mg Per Tube QHS   docusate  100 mg Per Tube BID   levETIRAcetam  500 mg Per Tube BID   pantoprazole IV  40 mg Intravenous QHS   polyethylene glycol  17 g Per Tube Daily   Continuous Infusions:  lactated ringers 100 mL/hr at 03/07/24 0913   levETIRAcetam     magnesium sulfate bolus IVPB 2 g (03/07/24 0916)   propofol (DIPRIVAN) infusion 40 mcg/kg/min (03/07/24 0859)   PRN Meds: docusate, polyethylene glycol  Labs Reviewed: Creatinine 0.38 CBG ranges from 75-113 mg/dL over the last 24 hours HgbA1c 4.8% (10/12/23)  NUTRITION - FOCUSED PHYSICAL EXAM: Flowsheet Row Most Recent Value  Orbital Region Severe depletion  Upper Arm Region Severe depletion  Thoracic and Lumbar Region Severe depletion  Buccal Region Severe depletion  Temple Region Severe depletion  Clavicle Bone Region Severe depletion  Clavicle and Acromion Bone Region Severe depletion  Scapular Bone Region Severe depletion  Dorsal Hand Severe depletion  Patellar Region Severe depletion  Anterior Thigh Region Severe depletion  Posterior Calf Region  Severe depletion  Edema (RD Assessment) None  Hair Reviewed  Eyes Reviewed  Mouth Reviewed  [poor dentition]  Skin Reviewed  Nails Reviewed    Diet Order:   Diet Order             Diet NPO time specified  Diet effective now                   EDUCATION NEEDS:  Not appropriate for education at this time  Skin:  Skin Assessment: Skin  Integrity Issues: Skin Integrity Issues:: Stage II Stage II: Coyccx (4 x 5 cm)  Last BM:  PTA  Height:  Ht Readings from Last 1 Encounters:  03/07/24 5' (1.524 m)    Weight:  Wt Readings from Last 1 Encounters:  03/07/24 41.8 kg    Ideal Body Weight:  45.5 kg  BMI:  Body mass index is 18 kg/m.  Estimated Nutritional Needs:  Kcal:  1400-1600 kcal Protein:  70-90 gm Fluid:  >1.4L/day    Greig Castilla, RD, LDN Registered Dietitian II Please reach out via secure chat

## 2024-03-07 NOTE — Progress Notes (Signed)
 NAME:  Chloe Wilcox, MRN:  [id number], DOB:  December 21, 1934, LOS: 1 ADMISSION DATE:  03/06/2024, CONSULTATION DATE:  03/06/24 REFERRING MD:  Andria Meuse CHIEF COMPLAINT:  Seizures   History of Present Illness:  Pt is encephelopathic; therefore, this HPI is obtained from chart review. Chloe Wilcox is a 89 y.o. female who has a PMH as below including but not limited to epilepsy, cognitive deficits, failure to thrive and who resides at a nursing home. She has a diagnosis of epilepsy and was supposed to be on Keppra 500mg  BID (listed on discharge summary from 10/02/23); however, she was apparently not on this at nursing facility for unclear reasons. She was seen normal at 6pm on evening of 03/06/24. When staff checked on her later, she was less responsive and was twitching with a right sided gaze.  EMS was called and administered IM Versed without improvement before establishing IV access and administering an additional Versed dose IV. There was roughly 45 minutes reported before seizures resolved.  She was brought to [facility] ED where she required intubation for airway protection. She had head CT which preliminary is negative for bleed. She was loaded with 1.5g Keppra and was seen by neurology who has started LTM. She is currently on a propofol infusion.  PCCM called for ICU admission.  Pertinent  Medical History:  has Acute respiratory failure with hypoxia (HCC); COPD (chronic obstructive pulmonary disease) (HCC); COPD with acute bronchitis (HCC); Hypokalemia; Essential hypertension; Respiratory failure (HCC); Hypoxemia; History of COVID-19; Abnormal findings on diagnostic imaging of lung; Closed displaced intertrochanteric fracture of right femur (HCC); Fall; Depression; Hyperlipidemia; Anemia of chronic disease; Acute metabolic encephalopathy; Protein-calorie malnutrition, severe; and Status epilepticus (HCC) on their problem list.  Significant Hospital Events: Including procedures, antibiotic start and stop dates  in addition to other pertinent events   4/9 admit 4/10 awaiting EEG read   Interim History / Subjective:  Admitted overnight, neuro consulted, on cEEG    Objective:  Blood pressure (!) 171/69, pulse 72, temperature 99.1 F (37.3 C), resp. rate 20, height 5' (1.524 m), weight 41.8 kg, SpO2 96%.    Vent Mode: PRVC FiO2 (%):  [40 %-60 %] 40 % Set Rate:  [16 bmp-20 bmp] 20 bmp Vt Set:  [400 mL] 400 mL PEEP:  [5 cmH20] 5 cmH20 Plateau Pressure:  [18 cmH20-20 cmH20] 18 cmH20   Intake/Output Summary (Last 24 hours) at 03/07/2024 0940 Last data filed at 03/07/2024 0800 Gross per 24 hour  Intake 1134.26 ml  Output 225 ml  Net 909.26 ml   Filed Weights   03/06/24 2000 03/06/24 2250 03/07/24 0500  Weight: 48 kg 41.8 kg 41.8 kg    Examination: General: Elderly critically ill appearing F NAD  Neuro: Sedated. Pinpoint pupils  HEENT: NCAT Cardiovascular: rrr cap refill < 3 sec  Lungs: Mechanically ventilated. Symmetrical chest expansion  Abdomen: soft nd  Musculoskeletal: no obvious joint deformity  Skin: c/d/w   Labs/imaging personally reviewed:  CT head 4/9 > no acute intracranial abnormality  EEG 4/9 >  MRI brain 4/10 >   Assessment & Plan:   Acute encephalopathy superimposed on underlying chronic cognitive deficits  Seizure disorder w breakthrough sz, status epilepticus  Hx depression -sz first dx in 09/2023, was dc on BID keppra 500mg , but looks like this was not maintained at her facility P -cEEG -- awaiting read  -neuro following -Keppra 500 BID  -restart celexa whena ble   Acute on chronic resp failure w hypoxia and hypercarbia  Hx COPD  Hx tobacco use  P -WUA/SBT when able -- defer WUA until okayed by neruop when cEEG is read  -VAP, pulm hygiene  -breztri prior to admission -- budesonide brovana yupelri here   HTN HLD P -cont statin, ASA  -home olmesartan not on formulary -- starting irbesartan in lieu of this   Protein calorie malnutrition -EN per RDN   -optimize lytes -- giving mag 4/10   FTT in adult GOC discussions -GOC/Code status talks were initiated 4/9 when admitted; family is still discussing & pt remains full code at this time    Best practice (evaluated daily):  Diet/type: NPO -- RDN consult 4/10 for EN DVT prophylaxis: prophylactic heparin  Pressure ulcer(s): pressure ulcer assessment deferred  GI prophylaxis: PPI Lines: N/A Foley:  Yes, and it is still needed Code Status:  full code Last date of multidisciplinary goals of care discussion: --   Labs   CBC: Recent Labs  Lab 03/06/24 2103 03/06/24 2104 03/06/24 2238 03/07/24 0622  WBC  --  7.9  --  8.5  NEUTROABS  --  6.8  --   --   HGB 10.9* 10.8* 10.5* 9.5*  HCT 32.0* 36.6 31.0* 31.1*  MCV  --  97.6  --  95.7  PLT  --  265  --  237    Basic Metabolic Panel: Recent Labs  Lab 03/06/24 2103 03/06/24 2104 03/06/24 2238 03/07/24 0622  NA 143 144 142 142  K 4.0 4.1 4.0 3.9  CL  --  105  --  102  CO2  --  33*  --  27  GLUCOSE  --  116*  --  85  BUN  --  17  --  15  CREATININE  --  0.40*  --  0.38*  CALCIUM  --  9.6  --  9.8  MG  --   --   --  1.7  PHOS  --   --   --  2.7   GFR: Estimated Creatinine Clearance: 33.3 mL/min (A) (by C-G formula based on SCr of 0.38 mg/dL (L)). Recent Labs  Lab 03/06/24 2104 03/07/24 0622  WBC 7.9 8.5    Liver Function Tests: Recent Labs  Lab 03/06/24 2104  AST 18  ALT 11  ALKPHOS 44  BILITOT 0.5  PROT 6.4*  ALBUMIN 3.8   No results for input(s): "LIPASE", "AMYLASE" in the last 168 hours. No results for input(s): "AMMONIA" in the last 168 hours.  ABG    Component Value Date/Time   PHART 7.381 03/06/2024 2238   PCO2ART 58.4 (H) 03/06/2024 2238   PO2ART 263 (H) 03/06/2024 2238   HCO3 34.7 (H) 03/06/2024 2238   TCO2 36 (H) 03/06/2024 2238   O2SAT 100 03/06/2024 2238     Coagulation Profile: No results for input(s): "INR", "PROTIME" in the last 168 hours.  Cardiac Enzymes: No results for input(s):  "CKTOTAL", "CKMB", "CKMBINDEX", "TROPONINI" in the last 168 hours.  HbA1C: Hgb A1c MFr Bld  Date/Time Value Ref Range Status  10/12/2023 07:37 AM 4.8 4.8 - 5.6 % Final    Comment:    (NOTE) Pre diabetes:          5.7%-6.4%  Diabetes:              >6.4%  Glycemic control for   <7.0% adults with diabetes   10/03/2023 12:23 AM 4.7 (L) 4.8 - 5.6 % Final    Comment:    (NOTE) Pre diabetes:  5.7%-6.4%  Diabetes:              >6.4%  Glycemic control for   <7.0% adults with diabetes     CBG: Recent Labs  Lab 03/06/24 2029 03/06/24 2338 03/07/24 0339 03/07/24 0715  GLUCAP 113* 96 88 75    CRITICAL CARE Performed by: Lanier Clam   Total critical care time: 39 minutes  Critical care time was exclusive of separately billable procedures and treating other patients. Critical care was necessary to treat or prevent imminent or life-threatening deterioration.  Critical care was time spent personally by me on the following activities: development of treatment plan with patient and/or surrogate as well as nursing, discussions with consultants, evaluation of patient's response to treatment, examination of patient, obtaining history from patient or surrogate, ordering and performing treatments and interventions, ordering and review of laboratory studies, ordering and review of radiographic studies, pulse oximetry and re-evaluation of patient's condition.  Tessie Fass MSN, AGACNP-BC [facility] Pulmonary/Critical Care Medicine Amion for pager  03/07/2024, 9:40 AM

## 2024-03-07 NOTE — TOC Initial Note (Signed)
 Transition of Care [facility]) - Initial/Assessment Note    Patient Details  Name: Chloe Wilcox MRN: [id number] Date of Birth: 1935-05-26  [facility] CM/SW Contact:    Lamonte Sakai, Student-Social Work Phone Number: 03/07/2024, 8:52 AM  Clinical Narrative:                 Pt admitted from [facility] with seizures. TOC following for needs.        Patient Goals and CMS Choice            Expected Discharge Plan and Services       Living arrangements for the past 2 months: Single Family Home                                      Prior Living Arrangements/Services Living arrangements for the past 2 months: Single Family Home                     Activities of Daily Living   ADL Screening (condition at time of admission) Independently performs ADLs?: No Does the patient have a NEW difficulty with bathing/dressing/toileting/self-feeding that is expected to last >3 days?: Yes (Initiates electronic notice to provider for possible OT consult) Does the patient have a NEW difficulty with getting in/out of bed, walking, or climbing stairs that is expected to last >3 days?: Yes (Initiates electronic notice to provider for possible PT consult) Does the patient have a NEW difficulty with communication that is expected to last >3 days?: Yes (Initiates electronic notice to provider for possible SLP consult) Is the patient deaf or have difficulty hearing?: No Does the patient have difficulty seeing, even when wearing glasses/contacts?: No Does the patient have difficulty concentrating, remembering, or making decisions?: Yes  Permission Sought/Granted                  Emotional Assessment   Attitude/Demeanor/Rapport: Intubated (Following Commands or Not Following Commands) Affect (typically observed): Unable to Assess Orientation: :  (Intubated)      Admission diagnosis:  Status epilepticus (HCC) [G40.901] Patient Active Problem List   Diagnosis  Date Noted   Status epilepticus (HCC) 03/06/2024   Protein-calorie malnutrition, severe 10/05/2023   Acute metabolic encephalopathy 10/02/2023   Closed displaced intertrochanteric fracture of right femur (HCC) 07/10/2023   Fall 07/10/2023   Depression 07/10/2023   Hyperlipidemia 07/10/2023   Anemia of chronic disease 07/10/2023   History of COVID-19 09/14/2020   Abnormal findings on diagnostic imaging of lung 09/14/2020   Hypoxemia 07/23/2020   Respiratory failure (HCC) 06/15/2019   Acute respiratory failure with hypoxia (HCC) 06/14/2019   COPD (chronic obstructive pulmonary disease) (HCC) 06/14/2019   COPD with acute bronchitis (HCC) 06/14/2019   Hypokalemia 06/14/2019   Essential hypertension 06/14/2019   PCP:  Noberto Retort, MD Pharmacy:   [facility] - [place], [place] - [address] [place] [address] Phone: [phone] Fax: [phone]     Social Drivers of Health (SDOH) Social History: SDOH Screenings   Food Insecurity: Patient Unable To Answer (03/07/2024)  Housing: Patient Unable To Answer (03/07/2024)  Transportation Needs: Patient Unable To Answer (03/07/2024)  Utilities: Patient Unable To Answer (03/07/2024)  Social Connections: Patient Unable To Answer (03/07/2024)  Tobacco Use: Medium Risk (03/06/2024)   SDOH Interventions:     Readmission Risk Interventions     No  data to display

## 2024-03-07 NOTE — Progress Notes (Signed)
 Moved pt from ED [facility] to 4n26 last night at 11:30pm. Pt is being monitored by [facility] and push button was tested

## 2024-03-08 ENCOUNTER — Inpatient Hospital Stay (HOSPITAL_COMMUNITY)

## 2024-03-08 DIAGNOSIS — Z87891 Personal history of nicotine dependence: Secondary | ICD-10-CM | POA: Diagnosis not present

## 2024-03-08 DIAGNOSIS — G40901 Epilepsy, unspecified, not intractable, with status epilepticus: Secondary | ICD-10-CM | POA: Diagnosis not present

## 2024-03-08 DIAGNOSIS — J9622 Acute and chronic respiratory failure with hypercapnia: Secondary | ICD-10-CM | POA: Diagnosis not present

## 2024-03-08 DIAGNOSIS — J9621 Acute and chronic respiratory failure with hypoxia: Secondary | ICD-10-CM | POA: Diagnosis not present

## 2024-03-08 LAB — MAGNESIUM: Magnesium: 1.9 mg/dL (ref 1.7–2.4)

## 2024-03-08 LAB — GLUCOSE, CAPILLARY
Glucose-Capillary: 113 mg/dL — ABNORMAL HIGH (ref 70–99)
Glucose-Capillary: 117 mg/dL — ABNORMAL HIGH (ref 70–99)
Glucose-Capillary: 123 mg/dL — ABNORMAL HIGH (ref 70–99)
Glucose-Capillary: 127 mg/dL — ABNORMAL HIGH (ref 70–99)
Glucose-Capillary: 139 mg/dL — ABNORMAL HIGH (ref 70–99)

## 2024-03-08 LAB — PHOSPHORUS: Phosphorus: 2.8 mg/dL (ref 2.5–4.6)

## 2024-03-08 MED ORDER — SODIUM CHLORIDE 0.9 % IV SOLN
250.0000 mg | Freq: Once | INTRAVENOUS | Status: AC
  Administered 2024-03-08: 250 mg via INTRAVENOUS
  Filled 2024-03-08: qty 5

## 2024-03-08 MED ORDER — MIDAZOLAM HCL 2 MG/2ML IJ SOLN
INTRAMUSCULAR | Status: AC
  Filled 2024-03-08: qty 2

## 2024-03-08 MED ORDER — SODIUM CHLORIDE 0.9 % IV SOLN
200.0000 mg | INTRAVENOUS | Status: AC
Start: 2024-03-08 — End: 2024-03-08
  Administered 2024-03-08: 200 mg via INTRAVENOUS
  Filled 2024-03-08: qty 20

## 2024-03-08 MED ORDER — ACETAMINOPHEN 325 MG PO TABS
650.0000 mg | ORAL_TABLET | ORAL | Status: DC | PRN
  Administered 2024-03-08 – 2024-03-10 (×5): 650 mg
  Filled 2024-03-08 (×4): qty 2

## 2024-03-08 MED ORDER — MAGNESIUM SULFATE 2 GM/50ML IV SOLN
2.0000 g | Freq: Once | INTRAVENOUS | Status: AC
  Administered 2024-03-08: 2 g via INTRAVENOUS
  Filled 2024-03-08: qty 50

## 2024-03-08 MED ORDER — SODIUM CHLORIDE 0.9 % IV SOLN
50.0000 mg | Freq: Two times a day (BID) | INTRAVENOUS | Status: DC
  Administered 2024-03-08 (×2): 50 mg via INTRAVENOUS
  Filled 2024-03-08 (×4): qty 5

## 2024-03-08 NOTE — Progress Notes (Signed)
 Pt transported to MRI and back to 4N26 on vent w/o complications

## 2024-03-08 NOTE — Procedures (Addendum)
 Patient Name: Chloe Wilcox  MRN: [id number]  Epilepsy Attending: Arleene Lack  Referring Physician/Provider: Khaliqdina, Salman, MD  Duration: 03/07/2024 2228 to 03/08/2024 2359   Patient history:  89 y.o. female with recent diagnosis of seizures in November 2024 and discharged on Keppra 500 mg twice daily.  She she was brought into the ED today after she had a prolonged seizure at her facility.  Her seizure resolved clinically after 7.5 mg of Versed in the field, she had to be intubated after that for inability to protect her airway. EEG to evaluate for seizure   Level of alertness: comatose   AEDs during EEG study: LEV, LCM   Technical aspects: This EEG study was done with scalp electrodes positioned according to the 10-20 International system of electrode placement. Electrical activity was reviewed with band pass filter of 1-70Hz , sensitivity of 7 uV/mm, display speed of 69mm/sec with a 60Hz  notched filter applied as appropriate. EEG data were recorded continuously and digitally stored.  Video monitoring was available and reviewed as appropriate.   Description: EEG showed continuous generalized 3 to 6 Hz theta-delta slowing with overriding 13-15hz  beta activity.  Lateralized periodic discharges with overriding fast activity were noted in left hemisphere at 0.75 to 1.5 Hz, at times with overlying rhythmicity.     21 seizures without clinical signs were noted arising from left hemisphere. EEG showed igh amplitude 5-6hz  theta slowing which then evolved into 2-3Hz  delta slowing admixed with spike. Duration of seizure was 1 to 5 minutes.   EEG was disconnected between 03/08/2024 1020 to 1150 for MR brain.    ABNORMALITY - Seizures without clinical signs, left hemisphere - Lateralized periodic discharges with overriding fast activity and rhythmicity, left hemisphere ( LPD+F+R) - Continuous slow, generalized   IMPRESSION: This study showed 21 seizures without clinical signs arising from left  hemisphere lasting 1-5 minutes.   Additionally there is evidence of epileptogenicity arising from left hemisphere. This EEG pattern is on the ictal-interictal continuum with high suspicion for ictal nature due to overlying rhythmicity.    Lastly there is moderate diffuse encephalopathy.   Paetyn Pietrzak O Vaudie Engebretsen

## 2024-03-08 NOTE — Progress Notes (Addendum)
 Subjective: Tmax 101.57F.   ROS: Unable to obtain due to poor mental status  Examination  Vital signs in last 24 hours: Temp:  [99 F (37.2 C)-101.8 F (38.8 C)] 99.9 F (37.7 C) (04/11 0400) Pulse Rate:  [65-112] 75 (04/11 0400) Resp:  [11-22] 22 (04/11 0400) BP: (115-192)/(58-168) 168/71 (04/11 0400) SpO2:  [93 %-100 %] 100 % (04/11 0400) FiO2 (%):  [40 %] 40 % (04/11 0400)  General: lying in bed, NAD Neuro: Comatose, winces to noxious stimulation, does not open eyes, does not follow commands, PERRLA with no forced face deviation, corneal reflex intact, withdraws to noxious stimuli with antigravity strength in bilateral upper extremities, withdraws to noxious stimuli in bilateral lower extremities  Basic Metabolic Panel: Recent Labs  Lab 03/06/24 2103 03/06/24 2104 03/06/24 2238 03/07/24 0622  NA 143 144 142 142  K 4.0 4.1 4.0 3.9  CL  --  105  --  102  CO2  --  33*  --  27  GLUCOSE  --  116*  --  85  BUN  --  17  --  15  CREATININE  --  0.40*  --  0.38*  CALCIUM  --  9.6  --  9.8  MG  --   --   --  1.7  PHOS  --   --   --  2.7    CBC: Recent Labs  Lab 03/06/24 2103 03/06/24 2104 03/06/24 2238 03/07/24 0622  WBC  --  7.9  --  8.5  NEUTROABS  --  6.8  --   --   HGB 10.9* 10.8* 10.5* 9.5*  HCT 32.0* 36.6 31.0* 31.1*  MCV  --  97.6  --  95.7  PLT  --  265  --  237     Coagulation Studies: No results for input(s): "LABPROT", "INR" in the last 72 hours.  Imaging No new brain imaging overnight  ASSESSMENT AND PLAN:  89 y.o. female with hx of COPD on chronic oxygen, depression, hypertension, hyperlipidemia, iron deficiency anemia, right hip fracture, recent diagnosis of seizures in November 2024 and discharged on Keppra 500 mg twice daily.  She she was brought into the ED today after she had a prolonged seizure at her facility.  Her seizure resolved clinically after 7.5 mg of Versed in the field, she had to be intubated after that for inability to protect her  airway.   She was discharged on Keppra 500 mg twice daily after she presented with first-time seizure back in November 2024.  However, reviewing facility [facility] list, I do not see that she is on Keppra at her facility.   Epilepsy with breakthrough seizure Fever - Left LPD plus pattern as well as 2 seizures overnight   Recommendations - Will load with Vimpat 200 mg and start Vimpat 50 mg twice daily - Continue Keppra 750 mg twice daily (maximal renal dose) -Can continue to increase Vimpat if seizures recur -Will obtain MRI brain wo contrast to look for acute abnormality. Has MRI compatible eeg electrodes.  - Continue eeg while we are adjusting medications - Continue seizure precautions - PRN IV ativan for clinical seizures -Management of fever per primary team  I have spent a total of   36 minutes with the patient reviewing hospital notes,  test results, labs and examining the patient as well as establishing an assessment and plan.  > 50% of time was spent in direct patient care.         Wadsworth Skolnick  Epilepsy [facility] For questions after 5pm please refer to AMION to reach the Neurologist on call

## 2024-03-08 NOTE — Progress Notes (Signed)
 NAME:  Chloe Wilcox, MRN:  [id number], DOB:  01-24-1935, LOS: 2 ADMISSION DATE:  03/06/2024, CONSULTATION DATE:  03/06/24 REFERRING MD:  Andria Meuse CHIEF COMPLAINT:  Seizures   History of Present Illness:  Pt is encephelopathic; therefore, this HPI is obtained from chart review. Chloe Wilcox is a 89 y.o. female who has a PMH as below including but not limited to epilepsy, cognitive deficits, failure to thrive and who resides at a nursing home. She has a diagnosis of epilepsy and was supposed to be on Keppra 500mg  BID (listed on discharge summary from 10/02/23); however, she was apparently not on this at nursing facility for unclear reasons. She was seen normal at 6pm on evening of 03/06/24. When staff checked on her later, she was less responsive and was twitching with a right sided gaze.  EMS was called and administered IM Versed without improvement before establishing IV access and administering an additional Versed dose IV. There was roughly 45 minutes reported before seizures resolved.  She was brought to [facility] ED where she required intubation for airway protection. She had head CT which preliminary is negative for bleed. She was loaded with 1.5g Keppra and was seen by neurology who has started LTM. She is currently on a propofol infusion.  PCCM called for ICU admission.  Pertinent  Medical History:  has Acute respiratory failure with hypoxia (HCC); COPD (chronic obstructive pulmonary disease) (HCC); COPD with acute bronchitis (HCC); Hypokalemia; Essential hypertension; Respiratory failure (HCC); Hypoxemia; History of COVID-19; Abnormal findings on diagnostic imaging of lung; Closed displaced intertrochanteric fracture of right femur (HCC); Fall; Depression; Hyperlipidemia; Anemia of chronic disease; Acute metabolic encephalopathy; Protein-calorie malnutrition, severe; and Status epilepticus (HCC) on their problem list.  Significant Hospital Events: Including procedures, antibiotic start and stop dates  in addition to other pertinent events   4/9 admit 4/10 eeg w/o sz but did have evidence of epileptogenicity from L hemisphere. Weaned off prop per neuro   4/11 cEEG w 2 seizures w/o clinical signs overnight  Interim History / Subjective:   Overnight 2x sz on cEEG  Vimpat added   Elevated temp this morning 100  Objective:  Blood pressure (!) 135/55, pulse 66, temperature 100 F (37.8 C), resp. rate 20, height 5' (1.524 m), weight 44.2 kg, SpO2 100%.    Vent Mode: PRVC FiO2 (%):  [40 %] 40 % Set Rate:  [20 bmp] 20 bmp Vt Set:  [400 mL] 400 mL PEEP:  [5 cmH20] 5 cmH20 Plateau Pressure:  [14 cmH20-18 cmH20] 17 cmH20   Intake/Output Summary (Last 24 hours) at 03/08/2024 1104 Last data filed at 03/08/2024 0700 Gross per 24 hour  Intake 2805.4 ml  Output 975 ml  Net 1830.4 ml   Filed Weights   03/06/24 2250 03/07/24 0500 03/08/24 0500  Weight: 41.8 kg 41.8 kg 44.2 kg    Examination: General: Elderly frail cachectic appearing F NAD  Neuro: Off sedation. Does not follow commands. Moving BUE BLE to pain  HEENT: NCAT ETT secure anicteric sclera  Cardiovascular: rrr s1s2 no rgm  Lungs: mechanically ventilated. Clear  Abdomen: thin soft  Musculoskeletal: chronic arthritic changes. Decr muscle mass symmetrically  Skin: c/d/w    Labs/imaging personally reviewed:  CT head 4/9 > no acute intracranial abnormality  EEG 4/10: epileptogenicity L hemisphere EEG 4/11 2x sz   Assessment & Plan:   Acute encephalopathy superimposed on underlying chronic cognitive deficits  Seizure disorder with breakthrough seizure; status epilepticus  Hx depression  -sz first dx in  09/2023, was dc on BID keppra 500mg , but looks like this was not maintained at her facility P -4/10-11 overnight cEEG w 2x sz, no clinical signs. Vimpat added to keppra. AEDs per neuro  -MRI -sz precautions  -home celexa   AoC resp failure with hypoxia and hypercarbia Hx COPD Hx tobacco use P -WUA/SBT when  able -VAP, pulm hygiene  - budesonide brovana yupelri here   HTN HLD P -cont statin, ASA  -SCH irbesartan in lieu of home med + [facility] norvasc + PRN metop PRN hydral   Protein calorie malnutrition -EN per RDN  -add'l 2 g mag for lyte optimization. Refeeding risk.   FTT in adult GOC discussions -GOC/Code status talks were initiated 4/9 when admitted; family is still discussing & pt remains full code at this time   Lines/Tubes/Drains Labs  -ETT, continue -foley, continue- plan to dc when more awake  -does not need daily CBC BMP.  - Following CBGs, and daily mag / phos with risk of refeeding -check BMP 4/12  -CBC PRN.   Best practice (evaluated daily):  Diet/type: NPO -- RDN consult 4/10 for EN DVT prophylaxis: prophylactic heparin  Pressure ulcer(s): pressure ulcer assessment deferred  GI prophylaxis: PPI Lines: N/A Foley:  Yes, and it is still needed Code Status:  full code Last date of multidisciplinary goals of care discussion: --   Labs   CBC: Recent Labs  Lab 03/06/24 2103 03/06/24 2104 03/06/24 2238 03/07/24 0622  WBC  --  7.9  --  8.5  NEUTROABS  --  6.8  --   --   HGB 10.9* 10.8* 10.5* 9.5*  HCT 32.0* 36.6 31.0* 31.1*  MCV  --  97.6  --  95.7  PLT  --  265  --  237    Basic Metabolic Panel: Recent Labs  Lab 03/06/24 2103 03/06/24 2104 03/06/24 2238 03/07/24 0622 03/08/24 0514  NA 143 144 142 142  --   K 4.0 4.1 4.0 3.9  --   CL  --  105  --  102  --   CO2  --  33*  --  27  --   GLUCOSE  --  116*  --  85  --   BUN  --  17  --  15  --   CREATININE  --  0.40*  --  0.38*  --   CALCIUM  --  9.6  --  9.8  --   MG  --   --   --  1.7 1.9  PHOS  --   --   --  2.7 2.8   GFR: Estimated Creatinine Clearance: 35.2 mL/min (A) (by C-G formula based on SCr of 0.38 mg/dL (L)). Recent Labs  Lab 03/06/24 2104 03/07/24 0622  WBC 7.9 8.5    Liver Function Tests: Recent Labs  Lab 03/06/24 2104  AST 18  ALT 11  ALKPHOS 44  BILITOT 0.5  PROT 6.4*   ALBUMIN 3.8   No results for input(s): "LIPASE", "AMYLASE" in the last 168 hours. No results for input(s): "AMMONIA" in the last 168 hours.  ABG    Component Value Date/Time   PHART 7.381 03/06/2024 2238   PCO2ART 58.4 (H) 03/06/2024 2238   PO2ART 263 (H) 03/06/2024 2238   HCO3 34.7 (H) 03/06/2024 2238   TCO2 36 (H) 03/06/2024 2238   O2SAT 100 03/06/2024 2238     Coagulation Profile: No results for input(s): "INR", "PROTIME" in the last 168 hours.  Cardiac Enzymes:  No results for input(s): "CKTOTAL", "CKMB", "CKMBINDEX", "TROPONINI" in the last 168 hours.  HbA1C: Hgb A1c MFr Bld  Date/Time Value Ref Range Status  10/12/2023 07:37 AM 4.8 4.8 - 5.6 % Final    Comment:    (NOTE) Pre diabetes:          5.7%-6.4%  Diabetes:              >6.4%  Glycemic control for   <7.0% adults with diabetes   10/03/2023 12:23 AM 4.7 (L) 4.8 - 5.6 % Final    Comment:    (NOTE) Pre diabetes:          5.7%-6.4%  Diabetes:              >6.4%  Glycemic control for   <7.0% adults with diabetes     CBG: Recent Labs  Lab 03/07/24 1549 03/07/24 1942 03/07/24 2356 03/08/24 0356 03/08/24 0709  GLUCAP 70 122* 101* 127* 113*    CRITICAL CARE Performed by: Lanier Clam   Total critical care time: 37 minutes  Critical care time was exclusive of separately billable procedures and treating other patients. Critical care was necessary to treat or prevent imminent or life-threatening deterioration.  Critical care was time spent personally by me on the following activities: development of treatment plan with patient and/or surrogate as well as nursing, discussions with consultants, evaluation of patient's response to treatment, examination of patient, obtaining history from patient or surrogate, ordering and performing treatments and interventions, ordering and review of laboratory studies, ordering and review of radiographic studies, pulse oximetry and re-evaluation of patient's  condition.  Tessie Fass MSN, AGACNP-BC [facility] Amion for pager 03/08/2024, 11:04 AM

## 2024-03-08 NOTE — Progress Notes (Signed)
 LTM maint complete - no skin breakdown under: FP1,FP2.F8

## 2024-03-09 ENCOUNTER — Inpatient Hospital Stay (HOSPITAL_COMMUNITY)

## 2024-03-09 DIAGNOSIS — Z87891 Personal history of nicotine dependence: Secondary | ICD-10-CM | POA: Diagnosis not present

## 2024-03-09 DIAGNOSIS — G40901 Epilepsy, unspecified, not intractable, with status epilepticus: Secondary | ICD-10-CM | POA: Diagnosis not present

## 2024-03-09 DIAGNOSIS — J9622 Acute and chronic respiratory failure with hypercapnia: Secondary | ICD-10-CM | POA: Diagnosis not present

## 2024-03-09 DIAGNOSIS — J9621 Acute and chronic respiratory failure with hypoxia: Secondary | ICD-10-CM | POA: Diagnosis not present

## 2024-03-09 LAB — BASIC METABOLIC PANEL WITH GFR
Anion gap: 8 (ref 5–15)
BUN: 14 mg/dL (ref 8–23)
CO2: 32 mmol/L (ref 22–32)
Calcium: 9.2 mg/dL (ref 8.9–10.3)
Chloride: 98 mmol/L (ref 98–111)
Creatinine, Ser: 0.35 mg/dL — ABNORMAL LOW (ref 0.44–1.00)
GFR, Estimated: >60 mL/min (ref >60)
Glucose, Bld: 130 mg/dL — ABNORMAL HIGH (ref 70–99)
Potassium: 4 mmol/L (ref 3.5–5.1)
Sodium: 138 mmol/L (ref 135–145)

## 2024-03-09 LAB — CBC
HCT: 28.3 % — ABNORMAL LOW (ref 36.0–46.0)
Hemoglobin: 8.9 g/dL — ABNORMAL LOW (ref 12.0–15.0)
MCH: 29.3 pg (ref 26.0–34.0)
MCHC: 31.4 g/dL (ref 30.0–36.0)
MCV: 93.1 fL (ref 80.0–100.0)
Platelets: 233 10*3/uL (ref 150–400)
RBC: 3.04 MIL/uL — ABNORMAL LOW (ref 3.87–5.11)
RDW: 13 % (ref 11.5–15.5)
WBC: 10.6 10*3/uL — ABNORMAL HIGH (ref 4.0–10.5)
nRBC: 0 % (ref 0.0–0.2)

## 2024-03-09 LAB — GLUCOSE, CAPILLARY
Glucose-Capillary: 107 mg/dL — ABNORMAL HIGH (ref 70–99)
Glucose-Capillary: 122 mg/dL — ABNORMAL HIGH (ref 70–99)
Glucose-Capillary: 123 mg/dL — ABNORMAL HIGH (ref 70–99)
Glucose-Capillary: 235 mg/dL — ABNORMAL HIGH (ref 70–99)
Glucose-Capillary: 85 mg/dL (ref 70–99)
Glucose-Capillary: 86 mg/dL (ref 70–99)

## 2024-03-09 LAB — MAGNESIUM: Magnesium: 2 mg/dL (ref 1.7–2.4)

## 2024-03-09 LAB — PHOSPHORUS: Phosphorus: 2.8 mg/dL (ref 2.5–4.6)

## 2024-03-09 MED ORDER — VANCOMYCIN HCL IN DEXTROSE 1-5 GM/200ML-% IV SOLN
1000.0000 mg | Freq: Once | INTRAVENOUS | Status: AC
  Administered 2024-03-09: 1000 mg via INTRAVENOUS
  Filled 2024-03-09: qty 200

## 2024-03-09 MED ORDER — PIPERACILLIN-TAZOBACTAM 3.375 G IVPB
3.3750 g | Freq: Three times a day (TID) | INTRAVENOUS | Status: DC
  Administered 2024-03-09 – 2024-03-12 (×10): 3.375 g via INTRAVENOUS
  Filled 2024-03-09 (×10): qty 50

## 2024-03-09 MED ORDER — VANCOMYCIN VARIABLE DOSE PER UNSTABLE RENAL FUNCTION (PHARMACIST DOSING): Status: DC

## 2024-03-09 MED ORDER — SODIUM CHLORIDE 0.9 % IV SOLN
100.0000 mg | Freq: Two times a day (BID) | INTRAVENOUS | Status: DC
  Administered 2024-03-09 – 2024-03-10 (×3): 100 mg via INTRAVENOUS
  Filled 2024-03-09 (×4): qty 10

## 2024-03-09 MED ORDER — VANCOMYCIN HCL 500 MG/100ML IV SOLN
500.0000 mg | INTRAVENOUS | Status: DC
  Administered 2024-03-10 – 2024-03-12 (×3): 500 mg via INTRAVENOUS
  Filled 2024-03-09 (×3): qty 100

## 2024-03-09 MED ORDER — GABAPENTIN 250 MG/5ML PO SOLN
100.0000 mg | Freq: Three times a day (TID) | ORAL | Status: DC
  Administered 2024-03-09 – 2024-03-10 (×5): 100 mg
  Filled 2024-03-09 (×6): qty 2

## 2024-03-09 MED ORDER — PHENYTOIN SODIUM 50 MG/ML IJ SOLN
50.0000 mg | Freq: Three times a day (TID) | INTRAMUSCULAR | Status: DC
  Administered 2024-03-09 – 2024-03-10 (×4): 50 mg via INTRAVENOUS
  Filled 2024-03-09 (×4): qty 2

## 2024-03-09 NOTE — Plan of Care (Signed)
 Problem: Education: Goal: Knowledge of General Education information will improve Description: Including pain rating scale, medication(s)/side effects and non-pharmacologic comfort measures Outcome: Progressing   Problem: Health Behavior/Discharge Planning: Goal: Ability to manage health-related needs will improve Outcome: Progressing   Problem: Clinical Measurements: Goal: Ability to maintain clinical measurements within normal limits will improve Outcome: Progressing Goal: Will remain free from infection Outcome: Progressing Goal: Diagnostic test results will improve Outcome: Progressing Goal: Respiratory complications will improve Outcome: Progressing Goal: Cardiovascular complication will be avoided Outcome: Progressing   Problem: Activity: Goal: Risk for activity intolerance will decrease Outcome: Progressing   Problem: Nutrition: Goal: Adequate nutrition will be maintained Outcome: Progressing   Problem: Coping: Goal: Level of anxiety will decrease Outcome: Progressing   Problem: Elimination: Goal: Will not experience complications related to bowel motility Outcome: Progressing Goal: Will not experience complications related to urinary retention Outcome: Progressing   Problem: Pain Managment: Goal: General experience of comfort will improve and/or be controlled Outcome: Progressing   Problem: Safety: Goal: Ability to remain free from injury will improve Outcome: Progressing   Problem: Skin Integrity: Goal: Risk for impaired skin integrity will decrease Outcome: Progressing   Problem: Education: Goal: Expressions of having a comfortable level of knowledge regarding the disease process will increase Outcome: Progressing   Problem: Coping: Goal: Ability to adjust to condition or change in health will improve Outcome: Progressing Goal: Ability to identify appropriate support needs will improve Outcome: Progressing   Problem: Health Behavior/Discharge  Planning: Goal: Compliance with prescribed medication regimen will improve Outcome: Progressing   Problem: Medication: Goal: Risk for medication side effects will decrease Outcome: Progressing   Problem: Clinical Measurements: Goal: Complications related to the disease process, condition or treatment will be avoided or minimized Outcome: Progressing Goal: Diagnostic test results will improve Outcome: Progressing   Problem: Safety: Goal: Verbalization of understanding the information provided will improve Outcome: Progressing   Problem: Self-Concept: Goal: Level of anxiety will decrease Outcome: Progressing Goal: Ability to verbalize feelings about condition will improve Outcome: Progressing   Problem: Education: Goal: Knowledge of General Education information will improve Description: Including pain rating scale, medication(s)/side effects and non-pharmacologic comfort measures Outcome: Progressing   Problem: Health Behavior/Discharge Planning: Goal: Ability to manage health-related needs will improve Outcome: Progressing   Problem: Clinical Measurements: Goal: Ability to maintain clinical measurements within normal limits will improve Outcome: Progressing Goal: Will remain free from infection Outcome: Progressing Goal: Diagnostic test results will improve Outcome: Progressing Goal: Respiratory complications will improve Outcome: Progressing Goal: Cardiovascular complication will be avoided Outcome: Progressing   Problem: Activity: Goal: Risk for activity intolerance will decrease Outcome: Progressing   Problem: Nutrition: Goal: Adequate nutrition will be maintained Outcome: Progressing   Problem: Coping: Goal: Level of anxiety will decrease Outcome: Progressing   Problem: Elimination: Goal: Will not experience complications related to bowel motility Outcome: Progressing Goal: Will not experience complications related to urinary retention Outcome:  Progressing   Problem: Pain Managment: Goal: General experience of comfort will improve and/or be controlled Outcome: Progressing   Problem: Safety: Goal: Ability to remain free from injury will improve Outcome: Progressing   Problem: Skin Integrity: Goal: Risk for impaired skin integrity will decrease Outcome: Progressing   Problem: Education: Goal: Expressions of having a comfortable level of knowledge regarding the disease process will increase Outcome: Progressing   Problem: Coping: Goal: Ability to adjust to condition or change in health will improve Outcome: Progressing Goal: Ability to identify appropriate support needs will improve Outcome: Progressing  Problem: Health Behavior/Discharge Planning: Goal: Compliance with prescribed medication regimen will improve Outcome: Progressing   Problem: Medication: Goal: Risk for medication side effects will decrease Outcome: Progressing   Problem: Clinical Measurements: Goal: Complications related to the disease process, condition or treatment will be avoided or minimized Outcome: Progressing Goal: Diagnostic test results will improve Outcome: Progressing   Problem: Safety: Goal: Verbalization of understanding the information provided will improve Outcome: Progressing   Problem: Self-Concept: Goal: Level of anxiety will decrease Outcome: Progressing Goal: Ability to verbalize feelings about condition will improve Outcome: Progressing

## 2024-03-09 NOTE — Procedures (Signed)
 Patient Name: Chloe Wilcox  MRN: [id number]  Epilepsy Attending: Arleene Lack  Referring Physician/Provider: Khaliqdina, Salman, MD  Duration: 03/08/2024 2359 to 03/09/2024 2359   Patient history:  89 y.o. female with recent diagnosis of seizures in November 2024 and discharged on Keppra 500 mg twice daily.  She she was brought into the ED today after she had a prolonged seizure at her facility.  Her seizure resolved clinically after 7.5 mg of Versed in the field, she had to be intubated after that for inability to protect her airway. EEG to evaluate for seizure   Level of alertness: comatose   AEDs during EEG study: LEV, LCM, PHT, GBP   Technical aspects: This EEG study was done with scalp electrodes positioned according to the 10-20 International system of electrode placement. Electrical activity was reviewed with band pass filter of 1-70Hz , sensitivity of 7 uV/mm, display speed of 53mm/sec with a 60Hz  notched filter applied as appropriate. EEG data were recorded continuously and digitally stored.  Video monitoring was available and reviewed as appropriate.   Description: EEG showed continuous generalized 3 to 6 Hz theta-delta slowing with overriding 13-15hz  beta activity.  Lateralized periodic discharges with overriding fast activity were noted in left hemisphere at 0.75 to 1.5 Hz, at times with overlying rhythmicity.     37 seizures without clinical signs were noted arising from left hemisphere. EEG showed igh amplitude 5-6hz  theta slowing which then evolved into 2-3Hz  delta slowing admixed with spike. Average duration of seizure was 3 minutes.    ABNORMALITY - Seizures without clinical signs, left hemisphere - Lateralized periodic discharges with overriding fast activity and rhythmicity, left hemisphere ( LPD+F+R) - Continuous slow, generalized   IMPRESSION: This study showed 37 seizures without clinical signs arising from left hemisphere lasting 3 minutes on average.     Additionally there is evidence of epileptogenicity arising from left hemisphere. This EEG pattern is on the ictal-interictal continuum with high suspicion for ictal nature due to overlying rhythmicity.     Lastly there is moderate diffuse encephalopathy.   [name] [name]

## 2024-03-09 NOTE — Progress Notes (Signed)
LTM maint complete - no skin breakdown under:  Fp1, Fp2

## 2024-03-09 NOTE — Progress Notes (Signed)
 NAME:  Chloe Wilcox, MRN:  [id number], DOB:  12/03/1934, LOS: 3 ADMISSION DATE:  03/06/2024, CONSULTATION DATE:  03/06/24 REFERRING MD:  Florie Husband CHIEF COMPLAINT:  Seizures   History of Present Illness:  Pt is encephelopathic; therefore, this HPI is obtained from chart review. Chloe Wilcox is a 89 y.o. female who has a PMH as below including but not limited to epilepsy, cognitive deficits, failure to thrive and who resides at a nursing home. She has a diagnosis of epilepsy and was supposed to be on Keppra 500mg  BID (listed on discharge summary from 10/02/23); however, she was apparently not on this at nursing facility for unclear reasons. She was seen normal at 6pm on evening of 03/06/24. When staff checked on her later, she was less responsive and was twitching with a right sided gaze.  EMS was called and administered IM Versed without improvement before establishing IV access and administering an additional Versed dose IV. There was roughly 45 minutes reported before seizures resolved.  She was brought to [facility] ED where she required intubation for airway protection. She had head CT which preliminary is negative for bleed. She was loaded with 1.5g Keppra and was seen by neurology who has started LTM. She is currently on a propofol infusion.  PCCM called for ICU admission.  Pertinent  Medical History:  has Acute respiratory failure with hypoxia (HCC); COPD (chronic obstructive pulmonary disease) (HCC); COPD with acute bronchitis (HCC); Hypokalemia; Essential hypertension; Respiratory failure (HCC); Hypoxemia; History of COVID-19; Abnormal findings on diagnostic imaging of lung; Closed displaced intertrochanteric fracture of right femur (HCC); Fall; Depression; Hyperlipidemia; Anemia of chronic disease; Acute metabolic encephalopathy; Protein-calorie malnutrition, severe; and Status epilepticus (HCC) on their problem list.  Significant Hospital Events: Including procedures, antibiotic start and stop dates  in addition to other pertinent events   4/9 admit 4/10 eeg w/o sz but did have evidence of epileptogenicity from L hemisphere. Weaned off prop per neuro   4/11 cEEG w 2 seizures w/o clinical signs overnight  Interim History / Subjective:  Continued electroencephalographic evidence of seizure activity.  Anticonvulsant regimen further intensified by Epilepsy.  Patient now off sedation and more awake.  May have followed commands intermittently  Objective:  Blood pressure (!) 114/51, pulse 72, temperature 98.8 F (37.1 C), temperature source Axillary, resp. rate 17, height 5' (1.524 m), weight 44.4 kg, SpO2 100%.    Vent Mode: PSV FiO2 (%):  [40 %] 40 % Set Rate:  [20 bmp] 20 bmp Vt Set:  [400 mL] 400 mL PEEP:  [5 cmH20] 5 cmH20 Pressure Support:  [5 cmH20-12 cmH20] 5 cmH20   Intake/Output Summary (Last 24 hours) at 03/09/2024 1428 Last data filed at 03/09/2024 1000 Gross per 24 hour  Intake 1179.06 ml  Output 1115 ml  Net 64.06 ml   Filed Weights   03/07/24 0500 03/08/24 0500 03/09/24 0450  Weight: 41.8 kg 44.2 kg 44.4 kg    Examination: General: Elderly frail cachectic appearing woman. Neuro: Off sedation.  Eyes are open and she moves purposefully not following commands. HEENT: NCAT ETT secure anicteric sclera  Cardiovascular: Heart sounds are unremarkable. Lungs: Clear bilaterally.  Tolerating SBT. Abdomen: Scaphoid.  Soft. Musculoskeletal: chronic arthritic changes.  Decreased muscle mass. Skin: c/d/w     Labs/imaging personally reviewed:  CT head 4/9 > no acute intracranial abnormality  WBC 10.6, hemoglobin 8.9 Glycemic control adequate.  Assessment & Plan:   Acute encephalopathy superimposed on underlying chronic cognitive deficits  Seizure disorder with breakthrough seizure;  status epilepticus  Hx depression  AoC resp failure with hypoxia and hypercarbia Hx COPD Hx tobacco use HTN HLD Protein calorie malnutrition FTT in adult GOC  discussions  Plan:  -Continue daily SBT.  Extubate when mental status allows. -Continue to optimize anticonvulsant regimen.  Correlate EEG with examination. -Continue tube feeds. -Continue bronchodilators. -Continue home antihypertensive regimen.  Best practice (evaluated daily):  Diet/type: NPO -- RDN consult 4/10 for EN DVT prophylaxis: prophylactic heparin  Pressure ulcer(s): pressure ulcer assessment deferred  GI prophylaxis: PPI Lines: N/A Foley:  Yes, and it is still needed Code Status:  full code Last date of multidisciplinary goals of care discussion: --   CRITICAL CARE Performed by: Arlina Lair   Total critical care time: 40 minutes  Critical care time was exclusive of separately billable procedures and treating other patients. Critical care was necessary to treat or prevent imminent or life-threatening deterioration.  Critical care was time spent personally by me on the following activities: development of treatment plan with patient and/or surrogate as well as nursing, discussions with consultants, evaluation of patient's response to treatment, examination of patient, obtaining history from patient or surrogate, ordering and performing treatments and interventions, ordering and review of laboratory studies, ordering and review of radiographic studies, pulse oximetry and re-evaluation of patient's condition.  Arlina Lair, MD [facility] ICU Physician [facility]  Pager: [phone] Or Epic Secure Chat After hours: [phone].  03/09/2024, 2:37 PM     03/09/2024, 2:28 PM

## 2024-03-09 NOTE — Progress Notes (Signed)
 Pharmacy Antibiotic Note  Chloe Wilcox is a 89 y.o. female admitted on 03/06/2024 with status epilepticus due to non-adherence. Now with persistent fevers (tm 101) and concern for pneumonia.  Patient with ongoing seizures. Pharmacy has been consulted for Zosyn and Vancomycin dosing. Last CXR from 4/9. Respiratory culture sent this AM. Last CBC and BMET from 4/10  (WBC 8.5; SCr 0.38) - repeating this AM. No prior antibiotics this admission.  Low body weight, low muscle mass - SCr likely falsely low. Will estimate dosing based CrCl 35 ml/min (using SCr ~0.8).   Plan: Zosyn 3.375g IV every 8 hours - EI.  Vancomycin 1g IV x1 for now.  Follow-up labs for further dosing. Monitor culture results, clinical status, and renal function.   Height: 5' (152.4 cm) Weight: 44.4 kg (97 lb 14.2 oz) IBW/kg (Calculated) : 45.5  Temp (24hrs), Avg:100.5 F (38.1 C), Min:99.1 F (37.3 C), Max:101.3 F (38.5 C)  Recent Labs  Lab 03/06/24 2104 03/07/24 0622  WBC 7.9 8.5  CREATININE 0.40* 0.38*    Estimated Creatinine Clearance: 35.4 mL/min (A) (by C-G formula based on SCr of 0.38 mg/dL (L)).    No Known Allergies  Antimicrobials this admission: Vancomycin 4/12 >> Zosyn 4/12 >>  Dose adjustments this admission:   Microbiology results: 4/9 UCx: negative  4/12 Sputum: pending  4/9 MRSA PCR: positive  Thank you for allowing pharmacy to be a part of this patient's care.  Lenard Quam, PharmD, BCPS, BCCCP Please refer to [facility] for [facility] numbers 03/09/2024 8:49 AM

## 2024-03-09 NOTE — Progress Notes (Addendum)
 Subjective: NAEO. T Max 101.44F. Continues to have seizures.   ROS: Unable to obtain due to poor mental status  Examination  Vital signs in last 24 hours: Temp:  [98.8 F (37.1 C)-101.3 F (38.5 C)] 98.8 F (37.1 C) (04/12 1200) Pulse Rate:  [59-99] 72 (04/12 1110) Resp:  [9-21] 17 (04/12 1110) BP: (95-142)/(49-115) 114/51 (04/12 1000) SpO2:  [94 %-100 %] 100 % (04/12 1110) FiO2 (%):  [40 %] 40 % (04/12 1110) Weight:  [44.4 kg] 44.4 kg (04/12 0450)  General: lying in bed, NAD Neuro: opens eyes to noxious stimulation, does not follow commands, PERRLA with no forced face deviation, corneal reflex intact, withdraws to noxious stimuli with antigravity strength in bilateral upper extremities, withdraws to noxious stimuli in bilateral lower extremities  Basic Metabolic Panel: Recent Labs  Lab 03/06/24 2103 03/06/24 2104 03/06/24 2238 03/07/24 0622 03/08/24 0514 03/09/24 0807  NA 143 144 142 142  --  138  K 4.0 4.1 4.0 3.9  --  4.0  CL  --  105  --  102  --  98  CO2  --  33*  --  27  --  32  GLUCOSE  --  116*  --  85  --  130*  BUN  --  17  --  15  --  14  CREATININE  --  0.40*  --  0.38*  --  0.35*  CALCIUM  --  9.6  --  9.8  --  9.2  MG  --   --   --  1.7 1.9 2.0  PHOS  --   --   --  2.7 2.8 2.8    CBC: Recent Labs  Lab 03/06/24 2103 03/06/24 2104 03/06/24 2238 03/07/24 0622 03/09/24 0807  WBC  --  7.9  --  8.5 10.6*  NEUTROABS  --  6.8  --   --   --   HGB 10.9* 10.8* 10.5* 9.5* 8.9*  HCT 32.0* 36.6 31.0* 31.1* 28.3*  MCV  --  97.6  --  95.7 93.1  PLT  --  265  --  237 233     Coagulation Studies: No results for input(s): "LABPROT", "INR" in the last 72 hours.  Imaging personally reviewed  MR brain wo contrast 03/08/2024: Chronic cortical/subcortical left MCA territory infarct again demonstrated within the mid and posterior left frontal lobe, left insula and left parietal lobe. Adjacent region of left frontoparietal/insular cortical restricted diffusion,  spanning 3.5 cm, which may reflect seizure-related signal changes or an acute left MCA territory infarct. Subtle restricted diffusion also present within the left thalamus, which may reflect seizure-related changes or an acute infarct. Background parenchymal atrophy, chronic small vessel disease and small foci of chronic hemorrhage, as described within the body of the report.    ASSESSMENT AND PLAN:  89 y.o. female with hx of COPD on chronic oxygen, depression, hypertension, hyperlipidemia, iron deficiency anemia, right hip fracture, recent diagnosis of seizures in November 2024 and discharged on Keppra 500 mg twice daily.  She she was brought into the ED today after she had a prolonged seizure at her facility.  Her seizure resolved clinically after 7.5 mg of Versed in the field, she had to be intubated after that for inability to protect her airway.   She was discharged on Keppra 500 mg twice daily after she presented with first-time seizure back in November 2024.  However, reviewing facility [facility] list, I do not see that she is on Keppra at her  facility.   Epilepsy with breakthrough seizure Fever - Left LPD plus pattern as well as 21 seizures overnight   Recommendations - Increase Vimpat to 100 mg twice daily - Loaded with fospheny overnight, start maintenance dilantin 50mg  TID - Start Gabapentin 100mg  TID - Continue Keppra 750 mg twice daily (maximal renal dose) - Continue eeg while we are adjusting medications - Continue seizure precautions - PRN IV ativan for clinical seizures -Management of fever per primary team   I have spent a total of   35 minutes with the patient reviewing hospital notes,  test results, labs and examining the patient as well as establishing an assessment and plan.  > 50% of time was spent in direct patient care.            Roxy Cordial Epilepsy [facility] For questions after 5pm please refer to AMION to reach the Neurologist on call

## 2024-03-10 DIAGNOSIS — G40909 Epilepsy, unspecified, not intractable, without status epilepticus: Secondary | ICD-10-CM | POA: Diagnosis not present

## 2024-03-10 DIAGNOSIS — Z87891 Personal history of nicotine dependence: Secondary | ICD-10-CM | POA: Diagnosis not present

## 2024-03-10 DIAGNOSIS — J9622 Acute and chronic respiratory failure with hypercapnia: Secondary | ICD-10-CM | POA: Diagnosis not present

## 2024-03-10 DIAGNOSIS — J9621 Acute and chronic respiratory failure with hypoxia: Secondary | ICD-10-CM | POA: Diagnosis not present

## 2024-03-10 DIAGNOSIS — G40901 Epilepsy, unspecified, not intractable, with status epilepticus: Secondary | ICD-10-CM

## 2024-03-10 LAB — GLUCOSE, CAPILLARY
Glucose-Capillary: 107 mg/dL — ABNORMAL HIGH (ref 70–99)
Glucose-Capillary: 103 mg/dL — ABNORMAL HIGH (ref 70–99)
Glucose-Capillary: 102 mg/dL — ABNORMAL HIGH (ref 70–99)
Glucose-Capillary: 97 mg/dL (ref 70–99)
Glucose-Capillary: 75 mg/dL (ref 70–99)
Glucose-Capillary: 79 mg/dL (ref 70–99)

## 2024-03-10 LAB — PHOSPHORUS: Phosphorus: 3.4 mg/dL (ref 2.5–4.6)

## 2024-03-10 LAB — MAGNESIUM: Magnesium: 1.9 mg/dL (ref 1.7–2.4)

## 2024-03-10 LAB — TRIGLYCERIDES: Triglycerides: 56 mg/dL (ref <150)

## 2024-03-10 LAB — PHENYTOIN LEVEL, TOTAL: Phenytoin Lvl: 4.6 ug/mL — ABNORMAL LOW (ref 10.0–20.0)

## 2024-03-10 MED ORDER — PHENYTOIN SODIUM 50 MG/ML IJ SOLN
75.0000 mg | Freq: Three times a day (TID) | INTRAMUSCULAR | Status: DC
  Administered 2024-03-10 – 2024-03-11 (×3): 75 mg via INTRAVENOUS
  Filled 2024-03-10 (×3): qty 2

## 2024-03-10 MED ORDER — CITALOPRAM HYDROBROMIDE 10 MG PO TABS
10.0000 mg | ORAL_TABLET | Freq: Every day | ORAL | Status: DC
  Administered 2024-03-11: 10 mg via ORAL
  Filled 2024-03-10: qty 1

## 2024-03-10 MED ORDER — SODIUM CHLORIDE 0.9 % IV SOLN
100.0000 mg | Freq: Two times a day (BID) | INTRAVENOUS | Status: DC
  Administered 2024-03-10 – 2024-03-14 (×8): 100 mg via INTRAVENOUS
  Filled 2024-03-10 (×9): qty 10

## 2024-03-10 MED ORDER — POLYETHYLENE GLYCOL 3350 17 G PO PACK: 17.0000 g | PACK | Freq: Every day | ORAL | Status: DC

## 2024-03-10 MED ORDER — LEVETIRACETAM 750 MG PO TABS
750.0000 mg | ORAL_TABLET | Freq: Two times a day (BID) | ORAL | Status: DC
  Administered 2024-03-10 – 2024-03-14 (×7): 750 mg via ORAL
  Filled 2024-03-10 (×7): qty 1

## 2024-03-10 MED ORDER — ATORVASTATIN CALCIUM 40 MG PO TABS
40.0000 mg | ORAL_TABLET | Freq: Every day | ORAL | Status: DC
  Administered 2024-03-10: 40 mg via ORAL
  Filled 2024-03-10: qty 1

## 2024-03-10 MED ORDER — SODIUM CHLORIDE 0.9 % IV SOLN
200.0000 mg | Freq: Once | INTRAVENOUS | Status: AC
  Administered 2024-03-10: 200 mg via INTRAVENOUS
  Filled 2024-03-10: qty 4

## 2024-03-10 MED ORDER — ACETAMINOPHEN 325 MG PO TABS: 650.0000 mg | ORAL_TABLET | ORAL | Status: AC | PRN

## 2024-03-10 MED ORDER — GABAPENTIN 250 MG/5ML PO SOLN
100.0000 mg | Freq: Three times a day (TID) | ORAL | Status: DC
  Administered 2024-03-10 – 2024-03-11 (×2): 100 mg via ORAL
  Filled 2024-03-10 (×2): qty 2

## 2024-03-10 MED ORDER — DOCUSATE SODIUM 50 MG/5ML PO LIQD: 100.0000 mg | Freq: Two times a day (BID) | ORAL | Status: DC | PRN

## 2024-03-10 MED ORDER — SODIUM CHLORIDE 0.9 % IV SOLN
750.0000 mg | Freq: Two times a day (BID) | INTRAVENOUS | Status: DC
  Administered 2024-03-11: 750 mg via INTRAVENOUS
  Filled 2024-03-10 (×4): qty 7.5

## 2024-03-10 MED ORDER — POLYETHYLENE GLYCOL 3350 17 G PO PACK: 17.0000 g | PACK | Freq: Every day | ORAL | Status: DC | PRN

## 2024-03-10 MED ORDER — THIAMINE MONONITRATE 100 MG PO TABS
100.0000 mg | ORAL_TABLET | Freq: Every day | ORAL | Status: DC
  Administered 2024-03-11: 100 mg via ORAL
  Filled 2024-03-10: qty 1

## 2024-03-10 MED ORDER — AMLODIPINE BESYLATE 10 MG PO TABS
10.0000 mg | ORAL_TABLET | Freq: Every day | ORAL | Status: DC
  Administered 2024-03-11: 10 mg via ORAL
  Filled 2024-03-10: qty 1

## 2024-03-10 MED ORDER — ASPIRIN 81 MG PO CHEW
81.0000 mg | CHEWABLE_TABLET | Freq: Every day | ORAL | Status: DC
  Administered 2024-03-11: 81 mg via ORAL
  Filled 2024-03-10: qty 1

## 2024-03-10 MED ORDER — IRBESARTAN 150 MG PO TABS
150.0000 mg | ORAL_TABLET | Freq: Every day | ORAL | Status: DC
  Filled 2024-03-10: qty 1

## 2024-03-10 MED ORDER — DOCUSATE SODIUM 50 MG/5ML PO LIQD
100.0000 mg | Freq: Two times a day (BID) | ORAL | Status: DC
  Administered 2024-03-10: 100 mg via ORAL
  Filled 2024-03-10: qty 10

## 2024-03-10 NOTE — Progress Notes (Signed)
 eLink Physician-Brief Progress Note Patient Name: Chloe Wilcox DOB: 15-Feb-1935 MRN: [id number]   Date of Service  03/10/2024  HPI/Events of Note  Patient is having multiple stools with significant coccygeal ulcer with skin breakdown.  Images reviewed.  No direct rectal injury.  No recent surgical interventions.  No obvious coagulopathies.  eICU Interventions  Insert fecal management system     Intervention Category Minor Interventions: Routine modifications to care plan (e.g. PRN medications for pain, fever)  Mavis Gravelle 03/10/2024, 6:28 AM

## 2024-03-10 NOTE — Progress Notes (Signed)
 NAME:  Chloe Wilcox, MRN:  [id number], DOB:  24-Mar-1935, LOS: 4 ADMISSION DATE:  03/06/2024, CONSULTATION DATE:  03/06/24 REFERRING MD:  Florie Husband CHIEF COMPLAINT:  Seizures   History of Present Illness:  Pt is encephelopathic; therefore, this HPI is obtained from chart review. Chloe Wilcox is a 89 y.o. female who has a PMH as below including but not limited to epilepsy, cognitive deficits, failure to thrive and who resides at a nursing home. She has a diagnosis of epilepsy and was supposed to be on Keppra 500mg  BID (listed on discharge summary from 10/02/23); however, she was apparently not on this at nursing facility for unclear reasons. She was seen normal at 6pm on evening of 03/06/24. When staff checked on her later, she was less responsive and was twitching with a right sided gaze.  EMS was called and administered IM Versed without improvement before establishing IV access and administering an additional Versed dose IV. There was roughly 45 minutes reported before seizures resolved.  She was [facility] ED where she required intubation for airway protection. She had head CT which preliminary is negative for bleed. She was loaded with 1.5g Keppra and was seen by neurology who has started LTM. She is currently on a propofol infusion.  PCCM called for ICU admission.  Pertinent  Medical History:  has Acute respiratory failure with hypoxia (HCC); COPD (chronic obstructive pulmonary disease) (HCC); COPD with acute bronchitis (HCC); Hypokalemia; Essential hypertension; Respiratory failure (HCC); Hypoxemia; History of COVID-19; Abnormal findings on diagnostic imaging of lung; Closed displaced intertrochanteric fracture of right femur (HCC); Fall; Depression; Hyperlipidemia; Anemia of chronic disease; Acute metabolic encephalopathy; Protein-calorie malnutrition, severe; and Status epilepticus (HCC) on their problem list.  Significant Hospital Events: Including procedures, antibiotic start and stop dates  in addition to other pertinent events   4/9 admit 4/10 eeg w/o sz but did have evidence of epileptogenicity from L hemisphere. Weaned off prop per neuro   4/11 cEEG w 2 seizures w/o clinical signs overnight  Interim History / Subjective:  More awake, following commands.   Objective:  Blood pressure (!) 141/109, pulse 90, temperature 98.1 F (36.7 C), temperature source Axillary, resp. rate 17, height 5' (1.524 m), weight 44.4 kg, SpO2 95%.    Vent Mode: PSV FiO2 (%):  [40 %] 40 % Set Rate:  [20 bmp] 20 bmp Vt Set:  [400 mL] 400 mL PEEP:  [5 cmH20] 5 cmH20 Pressure Support:  [5 cmH20] 5 cmH20 Plateau Pressure:  [22 cmH20] 22 cmH20   Intake/Output Summary (Last 24 hours) at 03/10/2024 1747 Last data filed at 03/10/2024 1700 Gross per 24 hour  Intake 1347.98 ml  Output 250 ml  Net 1097.98 ml   Filed Weights   03/07/24 0500 03/08/24 0500 03/09/24 0450  Weight: 41.8 kg 44.2 kg 44.4 kg   Examination: General: Elderly frail cachectic appearing woman. Neuro: Off sedation.  Eyes are open and she moves purposefully . Intermittently following commands. HEENT: NCAT ETT secure anicteric sclera  Cardiovascular: Heart sounds are unremarkable. Lungs: Clear bilaterally.  Tolerating SBT. Abdomen: Scaphoid.  Soft. Musculoskeletal: chronic arthritic changes.  Decreased muscle mass. Skin: c/d/w   Labs/imaging personally reviewed:  CT head 4/9 > no acute intracranial abnormality  WBC 10.6, hemoglobin 8.9 Glycemic control adequate. 0.35 Cultures + for staphylococcus aureus. Assessment & Plan:   Acute encephalopathy superimposed on underlying chronic cognitive deficits  Seizure disorder with breakthrough seizure; status epilepticus  Hx depression  AoC resp failure with hypoxia and hypercarbia  Hx COPD Hx tobacco use HTN HLD Protein calorie malnutrition FTT in adult GOC discussions  Plan:  -Passed SBT and now more awake. Will extubate.  - Will space out AED's to avoid  oversedation. - Complete 5 days of antibiotics for HCAP. Narrow antibiotics once cultures finalize. - Continue bronchodilators.  Best practice (evaluated daily):  Diet/type: NPO -- SLP tomorrow.  DVT prophylaxis: prophylactic heparin  Pressure ulcer(s): pressure ulcer assessment deferred  GI prophylaxis: PPI Lines: N/A Foley:  Yes, and it is still needed Code Status:  full code Last date of multidisciplinary goals of care discussion: --   CRITICAL CARE Performed by: Arlina Lair   Total critical care time: 40 minutes  Critical care time was exclusive of separately billable procedures and treating other patients. Critical care was necessary to treat or prevent imminent or life-threatening deterioration.  Critical care was time spent personally by me on the following activities: development of treatment plan with patient and/or surrogate as well as nursing, discussions with consultants, evaluation of patient's response to treatment, examination of patient, obtaining history from patient or surrogate, ordering and performing treatments and interventions, ordering and review of laboratory studies, ordering and review of radiographic studies, pulse oximetry and re-evaluation of patient's condition.  Arlina Lair, MD [facility] ICU Physician [facility] [facility]  Pager: [phone] Or Epic Secure Chat After hours: [phone].  03/10/2024, 5:47 PM     03/10/2024, 5:47 PM

## 2024-03-10 NOTE — Progress Notes (Signed)
 Subjective: Seizures are improving, but continues to have some.  ROS: Unable to obtain due to intubation  Examination  Vital signs in last 24 hours: Temp:  [98.6 F (37 C)-100.7 F (38.2 C)] 98.6 F (37 C) (04/13 0813) Pulse Rate:  [55-110] 79 (04/13 0910) Resp:  [14-24] 23 (04/13 0910) BP: (94-159)/(48-135) 159/121 (04/13 0800) SpO2:  [96 %-100 %] 100 % (04/13 0910) FiO2 (%):  [40 %] 40 % (04/13 0910)  General: lying in bed, NAD Neuro: opens eyes to mild stimulation, she follows commands readily to wiggle toes and stick out tongue, PERRLA, she crosses midline in both directions readily, withdraws to noxious stimuli x4  Basic Metabolic Panel: Recent Labs  Lab 03/06/24 2103 03/06/24 2104 03/06/24 2238 03/07/24 0622 03/08/24 0514 03/09/24 0807 03/10/24 0724  NA 143 144 142 142  --  138  --   K 4.0 4.1 4.0 3.9  --  4.0  --   CL  --  105  --  102  --  98  --   CO2  --  33*  --  27  --  32  --   GLUCOSE  --  116*  --  85  --  130*  --   BUN  --  17  --  15  --  14  --   CREATININE  --  0.40*  --  0.38*  --  0.35*  --   CALCIUM  --  9.6  --  9.8  --  9.2  --   MG  --   --   --  1.7 1.9 2.0 1.9  PHOS  --   --   --  2.7 2.8 2.8 3.4    CBC: Recent Labs  Lab 03/06/24 2103 03/06/24 2104 03/06/24 2238 03/07/24 0622 03/09/24 0807  WBC  --  7.9  --  8.5 10.6*  NEUTROABS  --  6.8  --   --   --   HGB 10.9* 10.8* 10.5* 9.5* 8.9*  HCT 32.0* 36.6 31.0* 31.1* 28.3*  MCV  --  97.6  --  95.7 93.1  PLT  --  265  --  237 233   Phenytoin level is 4.6, last albumin was 3.8  Coagulation Studies: No results for input(s): "LABPROT", "INR" in the last 72 hours.  Imaging personally reviewed  MR brain wo contrast 03/08/2024: Chronic cortical/subcortical left MCA territory infarct again demonstrated within the mid and posterior left frontal lobe, left insula and left parietal lobe. Adjacent region of left frontoparietal/insular cortical restricted diffusion, spanning 3.5 cm, which may  reflect seizure-related signal changes or an acute left MCA territory infarct. Subtle restricted diffusion also present within the left thalamus, which may reflect seizure-related changes or an acute infarct. Background parenchymal atrophy, chronic small vessel disease and small foci of chronic hemorrhage, as described within the body of the report.    ASSESSMENT AND PLAN:  89 y.o. female with hx of COPD on chronic oxygen, depression, hypertension, hyperlipidemia, iron deficiency anemia, right hip fracture, recent diagnosis of seizures in November 2024 and discharged on Keppra 500 mg twice daily.  She was brought into the ED after she had a prolonged seizure at her facility.  Her seizure resolved clinically after 7.5 mg of Versed in the field, she had to be intubated after that for inability to protect her airway.   She was discharged on Keppra 500 mg twice daily after she presented with first-time seizure back in November 2024.  However, it  appears that she did was not taking that at her facility  Her phenytoin level is low, so I will give a small additional dose and increase her maintenance   Epilepsy with breakthrough seizure Fever -Seizures improving but still present   Recommendations - Continue Vimpat 100 mg twice daily -Fosphenytoin 5 mg PE per kilogram and increase maintenance of phenytoin from 50 3 times daily to 75 3 times daily - Continue gabapentin 100mg  TID - Continue Keppra 750 mg twice daily (maximal renal dose) - Continue eeg while we are adjusting medications - Continue seizure precautions - PRN IV ativan for clinical seizures -Management of fever per primary team    Ann Keto, MD [facility]   If 7pm- 7am, please page neurology on call as listed in AMION.

## 2024-03-10 NOTE — Procedures (Signed)
 Patient Name: Chloe Wilcox  MRN: [id number]  Epilepsy Attending: Arleene Lack  Referring Physician/Provider: Khaliqdina, Salman, MD  Duration: 03/09/2024 2359 to 03/10/2024 2359   Patient history:  89 y.o. female with recent diagnosis of seizures in November 2024 and discharged on Keppra 500 mg twice daily.  She she was brought into the ED today after she had a prolonged seizure at her facility.  Her seizure resolved clinically after 7.5 mg of Versed in the field, she had to be intubated after that for inability to protect her airway. EEG to evaluate for seizure   Level of alertness: awake, asleep   AEDs during EEG study: LEV, LCM, PHT, GBP   Technical aspects: This EEG study was done with scalp electrodes positioned according to the 10-20 International system of electrode placement. Electrical activity was reviewed with band pass filter of 1-70Hz , sensitivity of 7 uV/mm, display speed of 21mm/sec with a 60Hz  notched filter applied as appropriate. EEG data were recorded continuously and digitally stored.  Video monitoring was available and reviewed as appropriate.   Description: EEG showed continuous generalized 3 to 6 Hz theta-delta slowing with overriding 13-15hz  beta activity. Sleep was characterized by sleep spindles (12-14hz ), maximal fronto-central region. Lateralized periodic discharges with overriding fast activity were noted in left hemisphere at 0.5-1Hz .   Four seizures without clinical signs were noted arising from left hemisphere on 03/10/2024 at 0426, 0528, 0901 and 1018. EEG showed 5-6hz  theta slowing which then evolved into 2-3Hz  delta slowing admixed with spike. Duration of seizure was  about 1 minute 30 seconds, 2 minutes, 2 minutes 30 seconds and 45 seconds respectively.   ABNORMALITY - Seizures without clinical signs, left hemisphere - Lateralized periodic discharges with overriding fast activity, left hemisphere ( LPD+F) - Continuous slow, generalized   IMPRESSION: This  study showed four seizures without clinical signs arising from left hemisphere n 03/10/2024 at 0426, 0528, 0901 and 1018 lasting 1 minute 30 seconds, 2 minutes, 2 minutes 30 seconds and 45 seconds respectively.   Additionally there is evidence of epileptogenicity arising from left hemisphere. This EEG pattern is on the ictal-interictal continuum with high risk of seizure recurrence.   Lastly there is moderate diffuse encephalopathy.  EEG appears to be improving compared to previous day.   Brihanna Devenport O Olivianna Higley

## 2024-03-11 ENCOUNTER — Inpatient Hospital Stay (HOSPITAL_COMMUNITY)

## 2024-03-11 DIAGNOSIS — G40901 Epilepsy, unspecified, not intractable, with status epilepticus: Secondary | ICD-10-CM

## 2024-03-11 LAB — GLUCOSE, CAPILLARY
Glucose-Capillary: 117 mg/dL — ABNORMAL HIGH (ref 70–99)
Glucose-Capillary: 123 mg/dL — ABNORMAL HIGH (ref 70–99)
Glucose-Capillary: 125 mg/dL — ABNORMAL HIGH (ref 70–99)
Glucose-Capillary: 134 mg/dL — ABNORMAL HIGH (ref 70–99)
Glucose-Capillary: 135 mg/dL — ABNORMAL HIGH (ref 70–99)
Glucose-Capillary: 64 mg/dL — ABNORMAL LOW (ref 70–99)
Glucose-Capillary: 68 mg/dL — ABNORMAL LOW (ref 70–99)
Glucose-Capillary: 88 mg/dL (ref 70–99)

## 2024-03-11 MED ORDER — ASPIRIN 81 MG PO CHEW
81.0000 mg | CHEWABLE_TABLET | Freq: Every day | ORAL | Status: DC
  Administered 2024-03-12 – 2024-03-22 (×11): 81 mg
  Filled 2024-03-11 (×11): qty 1

## 2024-03-11 MED ORDER — JUVEN PO PACK: 1.0000 | PACK | Freq: Two times a day (BID) | ORAL | Status: DC

## 2024-03-11 MED ORDER — ENSURE ENLIVE PO LIQD
237.0000 mL | Freq: Two times a day (BID) | ORAL | Status: DC
  Administered 2024-03-14 – 2024-03-15 (×3): 237 mL via ORAL

## 2024-03-11 MED ORDER — ATORVASTATIN CALCIUM 40 MG PO TABS
40.0000 mg | ORAL_TABLET | Freq: Every day | ORAL | Status: DC
  Administered 2024-03-11 – 2024-03-21 (×11): 40 mg
  Filled 2024-03-11 (×11): qty 1

## 2024-03-11 MED ORDER — DEXTROSE 50 % IV SOLN
12.5000 g | INTRAVENOUS | Status: AC
  Administered 2024-03-11: 12.5 g via INTRAVENOUS
  Filled 2024-03-11: qty 50

## 2024-03-11 MED ORDER — GABAPENTIN 250 MG/5ML PO SOLN
100.0000 mg | Freq: Three times a day (TID) | ORAL | Status: DC
  Administered 2024-03-11 – 2024-03-22 (×33): 100 mg
  Filled 2024-03-11 (×36): qty 2

## 2024-03-11 MED ORDER — PHENYTOIN SODIUM 50 MG/ML IJ SOLN
100.0000 mg | Freq: Every day | INTRAMUSCULAR | Status: DC
  Administered 2024-03-11 – 2024-03-24 (×14): 100 mg via INTRAVENOUS
  Filled 2024-03-11 (×14): qty 2

## 2024-03-11 MED ORDER — JUVEN PO PACK
1.0000 | PACK | Freq: Two times a day (BID) | ORAL | Status: DC
  Administered 2024-03-11 – 2024-03-31 (×41): 1
  Filled 2024-03-11 (×41): qty 1

## 2024-03-11 MED ORDER — DEXTROSE 50 % IV SOLN: 25.0000 mL | Freq: Once | INTRAVENOUS | Status: AC

## 2024-03-11 MED ORDER — AMLODIPINE BESYLATE 10 MG PO TABS
10.0000 mg | ORAL_TABLET | Freq: Every day | ORAL | Status: DC
  Administered 2024-03-12 – 2024-03-22 (×11): 10 mg
  Filled 2024-03-11 (×11): qty 1

## 2024-03-11 MED ORDER — DEXTROSE 50 % IV SOLN
INTRAVENOUS | Status: AC
  Administered 2024-03-11: 25 mL via INTRAVENOUS
  Filled 2024-03-11: qty 50

## 2024-03-11 MED ORDER — POLYETHYLENE GLYCOL 3350 17 G PO PACK: 17.0000 g | PACK | Freq: Every day | ORAL | Status: DC | PRN

## 2024-03-11 MED ORDER — POLYETHYLENE GLYCOL 3350 17 G PO PACK
17.0000 g | PACK | Freq: Every day | ORAL | Status: DC
  Filled 2024-03-11: qty 1

## 2024-03-11 MED ORDER — THIAMINE MONONITRATE 100 MG PO TABS
100.0000 mg | ORAL_TABLET | Freq: Every day | ORAL | Status: AC
Start: 2024-03-12 — End: 2024-03-14
  Administered 2024-03-12 – 2024-03-13 (×2): 100 mg
  Filled 2024-03-11 (×2): qty 1

## 2024-03-11 MED ORDER — IRBESARTAN 150 MG PO TABS
150.0000 mg | ORAL_TABLET | Freq: Every day | ORAL | Status: DC
  Administered 2024-03-11 – 2024-03-21 (×11): 150 mg
  Filled 2024-03-11 (×10): qty 1

## 2024-03-11 MED ORDER — ADULT MULTIVITAMIN W/MINERALS CH
1.0000 | ORAL_TABLET | Freq: Every day | ORAL | Status: DC
  Administered 2024-03-11 – 2024-03-22 (×12): 1
  Filled 2024-03-11 (×12): qty 1

## 2024-03-11 MED ORDER — DOCUSATE SODIUM 50 MG/5ML PO LIQD
100.0000 mg | Freq: Two times a day (BID) | ORAL | Status: DC | PRN
  Administered 2024-03-11: 100 mg

## 2024-03-11 MED ORDER — CITALOPRAM HYDROBROMIDE 20 MG PO TABS
10.0000 mg | ORAL_TABLET | Freq: Every day | ORAL | Status: DC
  Administered 2024-03-12 – 2024-03-22 (×11): 10 mg
  Filled 2024-03-11 (×11): qty 1

## 2024-03-11 MED ORDER — PHENYTOIN SODIUM 50 MG/ML IJ SOLN
75.0000 mg | Freq: Two times a day (BID) | INTRAMUSCULAR | Status: DC
  Administered 2024-03-11 – 2024-03-25 (×28): 75 mg via INTRAVENOUS
  Filled 2024-03-11 (×28): qty 2

## 2024-03-11 MED ORDER — DOCUSATE SODIUM 50 MG/5ML PO LIQD
100.0000 mg | Freq: Two times a day (BID) | ORAL | Status: DC
  Administered 2024-03-14 – 2024-03-16 (×5): 100 mg
  Filled 2024-03-11 (×6): qty 10

## 2024-03-11 NOTE — Evaluation (Signed)
 Physical Therapy Evaluation Patient Details Name: Chloe Wilcox MRN: [id number] DOB: May 03, 1935 Today's Date: 03/11/2024  History of Present Illness  The pt is an 89 yo female presenting from [facility] 4/9 with seizure. Intubated for airway protection, extubated 4/13. PMH includes: epilepsy, cognitive deficits, failure to thrive, COPD, HTN, R femur fx, falls, HLD, and malnutrition.  Clinical Impression  Pt in bed upon arrival of PT, agreeable to evaluation at this time. Prior to admission the pt was able to mobilize in [facility], but no family was present at time of evaluation to confirm. The pt presents today able to state her name and confirm tha there birthday is in [birth year], but otherwise not following commands or assisting with mobility or ROM. Pt not able to participate in MMT, but does demo poor ROM in BLE, and required totalA of 2 to complete rolling for pericare in the bed. The pt will likely benefit from return to rehab with continued therapies <3hours/day to facilitate return to prior level of mobility and independence.       If plan is discharge home, recommend the following: Two people to help with walking and/or transfers;Assistance with cooking/housework;Two people to help with bathing/dressing/bathroom;Assistance with feeding;Direct supervision/assist for medications management;Direct supervision/assist for financial management;Assist for transportation;Help with stairs or ramp for entrance;Supervision due to cognitive status   Can travel by private vehicle   No    Equipment Recommendations None recommended by PT  Recommendations for Other Services       Functional Status Assessment Patient has had a recent decline in their functional status and demonstrates the ability to make significant improvements in function in a reasonable and predictable amount of time.     Precautions / Restrictions Precautions Precautions: Fall (seizure) Recall of Precautions/Restrictions:  Impaired Restrictions Weight Bearing Restrictions Per Provider Order: No      Mobility  Bed Mobility Overal bed mobility: Needs Assistance Bed Mobility: Rolling Rolling: Total assist, +2 for physical assistance, +2 for safety/equipment         General bed mobility comments: total A of 2-3 as pt resisting. expresses fearful falling    Transfers                   General transfer comment: deferred due to pt fatigue and resisting with bed mobility          Pertinent Vitals/Pain Pain Assessment Pain Assessment: Faces Faces Pain Scale: Hurts even more Pain Location: generalized with rolling/moving Pain Descriptors / Indicators: Grimacing, Discomfort Pain Intervention(s): Limited activity within patient's tolerance, Monitored during session, Repositioned    Home Living Family/patient expects to be discharged to:: Skilled nursing facility                   Additional Comments: pt from [facility]    Prior Function Prior Level of Function : Needs assist;Patient poor historian/Family not available             Mobility Comments: pt unable to report - anticipate bed bound ADLs Comments: unable to report - anticipate ADLs with staff assist at bed level     Extremity/Trunk Assessment   Upper Extremity Assessment Upper Extremity Assessment: Difficult to assess due to impaired cognition (frail, low muscle bulk, pt not following commands for assessment, but generally resisting all attempts at movement. seems to have full ROM in hands, limited elbow extension)    Lower Extremity Assessment Lower Extremity Assessment: Difficult to assess due to impaired cognition (frail, low muscle bulk,  pt not following commands for assessment, but generally resisting all attempts at movement. seems to have full ROM in hands, limited elbow extension)    Cervical / Trunk Assessment Cervical / Trunk Assessment: Kyphotic  Communication   Communication Communication:  Impaired Factors Affecting Communication: Reduced clarity of speech;Difficulty expressing self    Cognition Arousal: Alert Behavior During Therapy: Anxious, Restless   PT - Cognitive impairments: History of cognitive impairments, No family/caregiver present to determine baseline                       PT - Cognition Comments: pt with hx of dementia, no family present to determine baseline. pt not following commands this session but was able to answer her name correctly and stated yes when asked if her birthday was in [place]. otherwise, pt not contributing to cog assessment Following commands: Impaired Following commands impaired:  (not following commands)     Cueing Cueing Techniques: Verbal cues, Gestural cues, Tactile cues, Visual cues     General Comments General comments (skin integrity, edema, etc.): VSS    Exercises Other Exercises Other Exercises: attempted PROM to BLE, limited at ankles, pt with significant stiffeness, unable to flex knees despite repeated attemps, can move at hips with assist   Assessment/Plan    PT Assessment Patient needs continued PT services  PT Problem List Decreased strength;Decreased range of motion;Decreased activity tolerance;Decreased balance;Decreased mobility;Decreased coordination;Decreased cognition;Decreased knowledge of use of DME;Decreased safety awareness;Impaired tone       PT Treatment Interventions DME instruction;Gait training;Stair training;Functional mobility training;Therapeutic activities;Therapeutic exercise;Balance training;Patient/family education    PT Goals (Current goals can be found in the Care Plan section)  Acute Rehab PT Goals Patient Stated Goal: none stated PT Goal Formulation: Patient unable to participate in goal setting Time For Goal Achievement: 03/25/24 Potential to Achieve Goals: Fair    Frequency Min 2X/week        AM-PAC PT "6 Clicks" Mobility  Outcome Measure Help needed turning from your  back to your side while in a flat bed without using bedrails?: Total Help needed moving from lying on your back to sitting on the side of a flat bed without using bedrails?: Total Help needed moving to and from a bed to a chair (including a wheelchair)?: Total Help needed standing up from a chair using your arms (e.g., wheelchair or bedside chair)?: Total Help needed to walk in hospital room?: Total Help needed climbing 3-5 steps with a railing? : Total 6 Click Score: 6    End of Session   Activity Tolerance: Patient limited by fatigue;Patient limited by pain Patient left: in bed;with call bell/phone within reach;with nursing/sitter in room Nurse Communication: Mobility status PT Visit Diagnosis: Muscle weakness (generalized) (M62.81)    Time: 0981-1914 PT Time Calculation (min) (ACUTE ONLY): 25 min   Charges:   PT Evaluation $PT Eval Low Complexity: 1 Low PT Treatments $Therapeutic Activity: 8-22 mins PT General Charges $$ ACUTE PT VISIT: 1 Visit         Barnabas Booth, PT, DPT   Acute Rehabilitation Department Office [phone] Secure Chat Communication Preferred  Lona Rist 03/11/2024, 4:28 PM

## 2024-03-11 NOTE — Progress Notes (Signed)
 Speech Language Pathology Treatment: Dysphagia  Patient Details Name: Chloe Wilcox MRN: [id number] DOB: 1935-06-12 Today's Date: 03/11/2024 Time: 9937-1696 SLP Time Calculation (min) (ACUTE ONLY): 15 min  Assessment / Plan / Recommendation Clinical Impression  Patient seen by SLP later in the morning following initial evaluation secondary to RN reporting patient exhibiting delayed coughing after taking some PO's of thin liquids and meds crushed in applesauce. RD alerted SLP to this and that MD was going to discuss with family regarding Cortrak versus palliative consult. When patient arrived into room, patient awake, alert, remains confused and trying to remove restraint mitts. Her attention was a little worse than earlier, which was the likely cause of her increased delay in swallow initiation with liquids and puree. She did not exhibit any immediate coughing or throat clearing but did exhibit an instance of belching and one very delayed cough response. SLP recommending to continue with full liquids/thin consistency in small amounts with full supervision from nursing. Per MD, patient's family agree to have Cortrak placed. SLP will continue to follow.     HPI HPI: Patient is an 89 y.o. female with PMH: recent diagnosis of seizures (November 2024), dementia, FTT, HTN, COPD, depression, dysphagia. She presented to the hospital on 03/07/23 from her SNF secondary to reduced responsiveness, twitching, right sided gaze. EMS called and she received IM Versed without improvement and seizures resolved approximately after approximately 45 minutes. In ED, she required intubation for airway protection. CT head was negative for bleed. MRI brain showed Chronic cortical/subcortical left MCA territory infarct again demonstrated within the mid and posterior left frontal lobe, left  insula and left parietal lobe  She was extubated 03/10/24. She was kept NPO awaiting SLP evaluation of swallow.      SLP Plan  Continue with  current plan of care      Recommendations for follow up therapy are one component of a multi-disciplinary discharge planning process, led by the attending physician.  Recommendations may be updated based on patient status, additional functional criteria and insurance authorization.    Recommendations  Diet recommendations: Thin liquid;Other(comment) (full liquids) Liquids provided via: Cup;Straw Medication Administration: Crushed with puree Supervision: Full supervision/cueing for compensatory strategies;Trained caregiver to feed patient;Staff to assist with self feeding Compensations: Slow rate;Small sips/bites Postural Changes and/or Swallow Maneuvers: Seated upright 90 degrees;Upright 30-60 min after meal                  Oral care BID;Staff/trained caregiver to provide oral care   Frequent or constant Supervision/Assistance Dysphagia, unspecified (R13.10)     Continue with current plan of care   Jacqualine Mater, MA, CCC-SLP Speech Therapy

## 2024-03-11 NOTE — Progress Notes (Addendum)
 Nutrition Follow-up  DOCUMENTATION CODES:   Underweight, Severe malnutrition in context of social or environmental circumstances  INTERVENTION:   Initiate tube feeding via Cortrak: Osmolite 1.2 at 50 ml/h (1200 ml per day)  Provides 1440 kcal, 67 gm protein, 984 ml free water daily  Monitor magnesium and phosphorus daily x 3 occurrences, MD to replete as needed, as pt is at risk for refeeding syndrome given severe malnutrition and underweight BMI 1 packet Juven BID, each packet provides 95 calories, 2.5 grams of protein (collagen), to support wound healing Thiamine 100mg  daily per tube MVI with minerals daily per tube Ensure Enlive po BID, each supplement provides 350 kcal and 20 grams of protein.  NUTRITION DIAGNOSIS:   Severe Malnutrition related to social / environmental circumstances (communal living, poor functional status) as evidenced by severe fat depletion, severe muscle depletion.  - Still applicable   GOAL:   Patient will meet greater than or equal to 90% of their needs  - Progressing   MONITOR:   TF tolerance, I & O's, Vent status, Labs  REASON FOR ASSESSMENT:   Ventilator, Consult Enteral/tube feeding initiation and management  ASSESSMENT:   Pt with hx of HTN, HLD, and COPD on home O2 presented to ED from her facility with seizure-like activity.  4/9 - presented to ED from facility, intubated  4/10 - Tube feeds started via OGT 4/13 - Extubated, tube feeds stopped  4/14 - FLD, Cortrak placed and tube feeds restarted    Pateint extubated yesterday, enteral access lost. Diet advanced to FLD this morning. Per RN was eating a couple bites of applesauce and then started coughing. Spoke to SLP and MD about current nutrition status and possibility of cortrak. SLP recommended small amounts of FLD with full supervision or NPO. MD spoke to family and family is agreeable to cortrak in the short term until GOC is established.   Patient was on tube feeds since  4/10-4/13 and was able to tolerate being at goal. Can restart tube feeds at goal. Still monitor refeeding labs. Patient with severe muscle and fat wasting.   Temp (24hrs), Avg:98.6 F (37 C), Min:97.5 F (36.4 C), Max:99.4 F (37.4 C)  MAP (cuff): 120 mmHg  Admit weight: 41.8 kg  Current weight: 44.4 kg    Intake/Output Summary (Last 24 hours) at 03/11/2024 1318 Last data filed at 03/11/2024 1300 Gross per 24 hour  Intake 545.17 ml  Output 1950 ml  Net -1404.83 ml   Net IO Since Admission: 3,329.79 mL [03/11/24 1318]  UOP 1100 ml x 24 hours   Nutritionally Relevant Medications: Scheduled Meds:  feeding supplement  237 mL Oral BID BM   levETIRAcetam  750 mg Oral BID   multivitamin with minerals  1 tablet Per Tube Daily   nutrition supplement (JUVEN)  1 packet Per tube BID BM   [START ON 03/12/2024] thiamine  100 mg Per Tube Daily   Continuous Infusions:  feeding supplement (OSMOLITE 1.2 CAL) 50 mL/hr at 03/11/24 1300   lacosamide (VIMPAT) IV Stopped (03/11/24 0854)   levETIRAcetam Stopped (03/11/24 0931)   piperacillin-tazobactam (ZOSYN)  IV 12.5 mL/hr at 03/11/24 1300   vancomycin Stopped (03/11/24 1107)   Labs Reviewed: Creatinine 0.35, Vitamin D 10.47  CBG ranges from 75-123 mg/dL over the last 24 hours HgbA1c 4.8  Diet Order:   Diet Order             Diet full liquid Room service appropriate? Yes; Fluid consistency: Thin  Diet effective now  EDUCATION NEEDS:   Not appropriate for education at this time  Skin:  Skin Assessment: Skin Integrity Issues: Skin Integrity Issues:: Stage II Stage II: Coyccx  Last BM:  03/11/24 200 ml via FMS, type 7  Height:   Ht Readings from Last 1 Encounters:  03/07/24 5' (1.524 m)    Weight:   Wt Readings from Last 1 Encounters:  03/09/24 44.4 kg    Ideal Body Weight:  45.5 kg  BMI:  Body mass index is 19.12 kg/m.  Estimated Nutritional Needs:   Kcal:  1400-1600 kcal  Protein:  70-90  gm  Fluid:  >1.4L/day   Frederik Jansky, RD Registered Dietitian  See Amion for more information

## 2024-03-11 NOTE — Procedures (Signed)
 Patient Name: Chloe Wilcox  MRN: [id number]  Epilepsy Attending: Arleene Lack  Referring Physician/Provider: Khaliqdina, Salman, MD  Duration: 03/10/2024 2359 to 03/11/2024 2359   Patient history:  89 y.o. female with recent diagnosis of seizures in November 2024 and discharged on Keppra 500 mg twice daily.  She she was brought into the ED today after she had a prolonged seizure at her facility.  Her seizure resolved clinically after 7.5 mg of Versed in the field, she had to be intubated after that for inability to protect her airway. EEG to evaluate for seizure   Level of alertness: awake, asleep   AEDs during EEG study: LEV, LCM, PHT, GBP   Technical aspects: This EEG study was done with scalp electrodes positioned according to the 10-20 International system of electrode placement. Electrical activity was reviewed with band pass filter of 1-70Hz , sensitivity of 7 uV/mm, display speed of 23mm/sec with a 60Hz  notched filter applied as appropriate. EEG data were recorded continuously and digitally stored.  Video monitoring was available and reviewed as appropriate.   Description: The posterior dominant rhythm consists of 8 Hz activity of moderate voltage (25-35 uV) seen predominantly in posterior head regions, asymmetric ( left<right) and reactive to eye opening and eye closing. Sleep was characterized by vertex waves, sleep spindles (12 to 14 Hz), maximal frontocentral region.  EEG showed continuous 3 to 6 Hz theta-delta slowing in left hemisphere, maximal left posterior quadrant. Lateralized periodic discharges with overriding fast activity were noted in left hemisphere at 0.5-1Hz .   One seizure without clinical signs was noted arising from left hemisphere on 03/11/2024 at 0535 EEG showed 5-6hz  theta slowing which then evolved into 2-3Hz  delta slowing admixed with spike. Duration of seizure was  about 3 minute 30 seconds.   ABNORMALITY - Seizures without clinical signs, left hemisphere -  Lateralized periodic discharges with overriding fast activity, left hemisphere ( LPD+F) - Continuous slow, left hemisphere, maximal left posterior quadrant   IMPRESSION: This study showed one seizures without clinical signs arising from left hemisphere on 03/11/2024 at 0535 lasting 3 minute 30 seconds.    Additionally there is evidence of epileptogenicity arising from left hemisphere. This EEG pattern is on the ictal-interictal continuum with high risk of seizure recurrence.   Lastly there is cortical dysfunction in left hemisphere, maximal left posterior quadrant likely secondary to underlying structural abnormality, seizure.   EEG continues to be improving compared to previous day.   Jahara Dail O Karilynn Carranza

## 2024-03-11 NOTE — Procedures (Signed)
 Cortrak  Person Inserting Tube:  Edwena Graham D, RD Tube Type:  Cortrak - 43 inches Tube Size:  10 Tube Location:  Left nare Initial Placement:  Stomach Secured by: Bridle Technique Used to Measure Tube Placement:  Marking at nare/corner of mouth Cortrak Secured At:  72 cm Procedure Comments:  Cortrak Tube Team Note:  Consult received to place a Cortrak feeding tube.   No x-ray is required. RN may begin using tube.   If the tube becomes dislodged please keep the tube and contact the Cortrak team at [email or web address] for replacement.  If after hours and replacement cannot be delayed, place a NG tube and confirm placement with an abdominal x-ray.    Edwena Graham, RD, LDN Registered Dietitian II Please reach out via secure chat

## 2024-03-11 NOTE — Progress Notes (Signed)
 Subjective: Awake, alert, able to answer some questions this morning but perseverating.  ROS: Unable to obtain due to poor mental status  Examination  Vital signs in last 24 hours: Temp:  [97.5 F (36.4 C)-99.4 F (37.4 C)] 97.5 F (36.4 C) (04/14 0800) Pulse Rate:  [66-91] 82 (04/14 1000) Resp:  [14-25] 23 (04/14 1000) BP: (110-171)/(60-115) 171/84 (04/14 1000) SpO2:  [95 %-100 %] 100 % (04/14 1000)  General: lying in bed, NAD Neuro: Awake, alert, able to tell me her name but did not answer the orientation questions or follow any commands, kept perseverating on her name, pupils did appear equally round and reactive to light, able to track me in the room, spontaneously moving all 4 extremities in bed  Basic Metabolic Panel: Recent Labs  Lab 03/06/24 2103 03/06/24 2104 03/06/24 2238 03/07/24 0622 03/08/24 0514 03/09/24 0807 03/10/24 0724  NA 143 144 142 142  --  138  --   K 4.0 4.1 4.0 3.9  --  4.0  --   CL  --  105  --  102  --  98  --   CO2  --  33*  --  27  --  32  --   GLUCOSE  --  116*  --  85  --  130*  --   BUN  --  17  --  15  --  14  --   CREATININE  --  0.40*  --  0.38*  --  0.35*  --   CALCIUM  --  9.6  --  9.8  --  9.2  --   MG  --   --   --  1.7 1.9 2.0 1.9  PHOS  --   --   --  2.7 2.8 2.8 3.4    CBC: Recent Labs  Lab 03/06/24 2103 03/06/24 2104 03/06/24 2238 03/07/24 0622 03/09/24 0807  WBC  --  7.9  --  8.5 10.6*  NEUTROABS  --  6.8  --   --   --   HGB 10.9* 10.8* 10.5* 9.5* 8.9*  HCT 32.0* 36.6 31.0* 31.1* 28.3*  MCV  --  97.6  --  95.7 93.1  PLT  --  265  --  237 233     Coagulation Studies: No results for input(s): "LABPROT", "INR" in the last 72 hours.  Imaging No new brain imaging overnight  ASSESSMENT AND PLAN:89 y.o. female with hx of COPD on chronic oxygen, depression, hypertension, hyperlipidemia, iron deficiency anemia, right hip fracture, recent diagnosis of seizures in November 2024 and discharged on Keppra 500 mg twice daily.   She was brought into the ED after she had a prolonged seizure at her facility.  Her seizure resolved clinically after 7.5 mg of Versed in the field, she had to be intubated after that for inability to protect her airway.   She was discharged on Keppra 500 mg twice daily after she presented with first-time seizure back in November 2024.  However, it appears that she did was not taking that at her facility   Her phenytoin level is low, so I will give a small additional dose and increase her maintenance   Epilepsy with breakthrough seizure Fever -Seizures improving significantly but did have 1 seizure overnight.   Recommendations -Increase phenytoin 100 mg nightly, continue 75 mg in the morning and afternoon - Continue Vimpat 100 mg twice daily, gabapentin 100mg  TID and Keppra 750 mg twice daily (maximal renal dose) - Continue eeg while  we are adjusting medications - Continue seizure precautions - PRN IV ativan for clinical seizures - Discussed plan with ICU team     I have spent a total of  36  minutes with the patient reviewing hospital notes,  test results, labs and examining the patient as well as establishing an assessment and plan. > 50% of time was spent in direct patient care.         Roxy Cordial Epilepsy [facility] For questions after 5pm please refer to AMION to reach the Neurologist on call

## 2024-03-11 NOTE — Progress Notes (Signed)
 LTM maint complete - no skin breakdown under:  Pz, P7,A2

## 2024-03-11 NOTE — Evaluation (Signed)
 Clinical/Bedside Swallow Evaluation Patient Details  Name: Chloe Wilcox MRN: [id number] Date of Birth: October 12, 1935  Today's Date: 03/11/2024 Time: SLP Start Time (ACUTE ONLY): 0850 SLP Stop Time (ACUTE ONLY): 0910 SLP Time Calculation (min) (ACUTE ONLY): 20 min  Past Medical History:  Past Medical History:  Diagnosis Date   COPD (chronic obstructive pulmonary disease) (HCC)    Depression    Hypertension    Past Surgical History:  Past Surgical History:  Procedure Laterality Date   INTRAMEDULLARY (IM) NAIL INTERTROCHANTERIC Right 07/10/2023   Procedure: INTRAMEDULLARY (IM) NAIL INTERTROCHANTERIC;  Surgeon: Sammye Cristal, MD;  Location: [facility];  Service: Orthopedics;  Laterality: Right;   VAGINAL HYSTERECTOMY     HPI:  Patient is an 89 y.o. female with PMH: recent diagnosis of seizures (November 2024), dementia, FTT, HTN, COPD, depression, dysphagia. She presented to the hospital on 03/07/23 from her SNF secondary to reduced responsiveness, twitching, right sided gaze. EMS called and she received IM Versed without improvement and seizures resolved approximately after approximately 45 minutes. In ED, she required intubation for airway protection. CT head was negative for bleed. MRI brain showed Chronic cortical/subcortical left MCA territory infarct again demonstrated within the mid and posterior left frontal lobe, left  insula and left parietal lobe  She was extubated 03/10/24. She was kept NPO awaiting SLP evaluation of swallow.    Assessment / Plan / Recommendation  Clinical Impression  Patient presents with clinical s/s of dysphagia as per this bedside swallow evaluation. SLP suspects primary esophageal dysphagia as per her h/o and belching observed during this evaluation and secondary cogntive based dysphagia. Patient exhibited mild amount of anterior spillage on right side of mouth but no significant amount of residuals in oral cavity s/s swallow. Swallow initiation was suspected to be  delayed with liquids. PO's of ice chips, thin liquids (water) via spoon and straw, pureer solids (applesauce) were all administered. No overt s/s aspiration but patient with a couple instances of delayed belching. SLP discussed with MD and recommending full liquids/thin consistency. SLP will follow. SLP Visit Diagnosis: Dysphagia, unspecified (R13.10)    Aspiration Risk  Mild aspiration risk    Diet Recommendation Thin liquid;Other (Comment) (full liquids)    Liquid Administration via: Cup;Straw Medication Administration: Crushed with puree Supervision: Full supervision/cueing for compensatory strategies;Staff to assist with self feeding Compensations: Slow rate;Small sips/bites Postural Changes: Seated upright at 90 degrees;Remain upright for at least 30 minutes after po intake    Other  Recommendations Oral Care Recommendations: Oral care BID;Staff/trained caregiver to provide oral care    Recommendations for follow up therapy are one component of a multi-disciplinary discharge planning process, led by the attending physician.  Recommendations may be updated based on patient status, additional functional criteria and insurance authorization.  Follow up Recommendations Other (comment) (TBD)      Assistance Recommended at Discharge    Functional Status Assessment Patient has had a recent decline in their functional status and demonstrates the ability to make significant improvements in function in a reasonable and predictable amount of time.  Frequency and Duration min 2x/week          Prognosis Prognosis for improved oropharyngeal function: Fair Barriers to Reach Goals: Cognitive deficits      Swallow Study   General Date of Onset: 03/06/24 HPI: Patient is an 89 y.o. female with PMH: recent diagnosis of seizures (November 2024), dementia, FTT, HTN, COPD, depression, dysphagia. She presented to the hospital on 03/07/23 from her SNF secondary to reduced responsiveness,  twitching,  right sided gaze. EMS called and she received IM Versed without improvement and seizures resolved approximately after approximately 45 minutes. In ED, she required intubation for airway protection. CT head was negative for bleed. MRI brain showed Chronic cortical/subcortical left MCA territory infarct again demonstrated within the mid and posterior left frontal lobe, left  insula and left parietal lobe  She was extubated 03/10/24. She was kept NPO awaiting SLP evaluation of swallow. Type of Study: Bedside Swallow Evaluation Previous Swallow Assessment: during previous admission in November 2024 Diet Prior to this Study: NPO Temperature Spikes Noted: No Respiratory Status: Nasal cannula History of Recent Intubation: Yes Total duration of intubation (days): 6 days Date extubated: 03/10/24 Behavior/Cognition: Alert;Cooperative;Requires cueing;Confused Oral Cavity Assessment: Within Functional Limits Oral Care Completed by SLP: Yes Oral Cavity - Dentition: Missing dentition Self-Feeding Abilities: Total assist Patient Positioning: Upright in bed Baseline Vocal Quality: Normal Volitional Cough: Cognitively unable to elicit Volitional Swallow: Unable to elicit    Oral/Motor/Sensory Function Overall Oral Motor/Sensory Function: Mild impairment (unable to follow commands) Facial Symmetry: Abnormal symmetry right Facial Strength: Reduced right   Ice Chips Ice chips: Impaired Presentation: Spoon Oral Phase Impairments: Poor awareness of bolus;Impaired mastication   Thin Liquid Thin Liquid: Impaired Presentation: Straw;Spoon Oral Phase Impairments: Reduced labial seal Pharyngeal  Phase Impairments: Suspected delayed Swallow    Nectar Thick Nectar Thick Liquid: Not tested   Honey Thick Honey Thick Liquid: Not tested   Puree Puree: Impaired Oral Phase Impairments: Reduced labial seal Oral Phase Functional Implications: Right anterior spillage   Solid     Solid: Not tested      Jacqualine Mater, MA, CCC-SLP Speech Therapy

## 2024-03-11 NOTE — Progress Notes (Signed)
 eLink Physician-Brief Progress Note Patient Name: Chloe Wilcox DOB: 07-23-1935 MRN: [id number]   Date of Service  03/11/2024  HPI/Events of Note  Received request for AM labs Reviewed previous labs and abnormalities noted Last labs 2 days ago Reasonable to repeat AM CBC and electrolytes  eICU Interventions  AM labs ordered     Intervention Category Intermediate Interventions: Other:  Turner Gains 03/11/2024, 8:04 PM

## 2024-03-11 NOTE — TOC Progression Note (Signed)
 Transition of Care [facility]) - Progression Note    Patient Details  Name: Chloe Wilcox MRN: [id number] Date of Birth: 10-Sep-1935  [facility] CM/SW Contact  Jannice Mends, LCSW Phone Number: 03/11/2024, 4:01 PM  Clinical Narrative:    CSW continuing to follow for medical stability.    Expected Discharge Plan: Skilled Nursing Facility Barriers to Discharge: Continued Medical Work up  Expected Discharge Plan and Services In-house Referral: Clinical Social Work   Post Acute Care Choice: Skilled Nursing Facility Living arrangements for the past 2 months: Skilled Nursing Facility                                       Social Determinants of Health (SDOH) Interventions SDOH Screenings   Food Insecurity: Patient Unable To Answer (03/07/2024)  Housing: Patient Unable To Answer (03/07/2024)  Transportation Needs: Patient Unable To Answer (03/07/2024)  Utilities: Patient Unable To Answer (03/07/2024)  Social Connections: Patient Unable To Answer (03/07/2024)  Tobacco Use: Medium Risk (03/06/2024)    Readmission Risk Interventions     No data to display

## 2024-03-11 NOTE — Progress Notes (Signed)
 NAME:  Chloe Wilcox, MRN:  [id number], DOB:  Feb 02, 1935, LOS: 5 ADMISSION DATE:  03/06/2024, CONSULTATION DATE:  03/06/24 REFERRING MD:  Florie Husband CHIEF COMPLAINT:  Seizures   History of Present Illness:  Pt is encephelopathic; therefore, this HPI is obtained from chart review. Chloe Wilcox is a 89 y.o. female who has a PMH as below including but not limited to epilepsy, cognitive deficits, failure to thrive and who resides at a nursing home. She has a diagnosis of epilepsy and was supposed to be on Keppra 500mg  BID (listed on discharge summary from 10/02/23); however, she was apparently not on this at nursing facility for unclear reasons. She was seen normal at 6pm on evening of 03/06/24. When staff checked on her later, she was less responsive and was twitching with a right sided gaze.  EMS was called and administered IM Versed without improvement before establishing IV access and administering an additional Versed dose IV. There was roughly 45 minutes reported before seizures resolved.  She was brought to [facility] ED where she required intubation for airway protection. She had head CT which preliminary is negative for bleed. She was loaded with 1.5g Keppra and was seen by neurology who has started LTM. She is currently on a propofol infusion.  PCCM called for ICU admission.  Pertinent  Medical History:  has Acute respiratory failure with hypoxia (HCC); COPD (chronic obstructive pulmonary disease) (HCC); COPD with acute bronchitis (HCC); Hypokalemia; Essential hypertension; Respiratory failure (HCC); Hypoxemia; History of COVID-19; Abnormal findings on diagnostic imaging of lung; Closed displaced intertrochanteric fracture of right femur (HCC); Fall; Depression; Hyperlipidemia; Anemia of chronic disease; Acute metabolic encephalopathy; Protein-calorie malnutrition, severe; and Status epilepticus (HCC) on their problem list.  Significant Hospital Events: Including procedures, antibiotic start and stop dates  in addition to other pertinent events   4/9 admit 4/10 eeg w/o sz but did have evidence of epileptogenicity from L hemisphere. Weaned off prop per neuro   4/11 cEEG w 2 seizures w/o clinical signs overnight 4/13 extubated  Interim History / Subjective:  More awake, following commands.   Objective:  Blood pressure (!) 138/93, pulse 78, temperature 99.4 F (37.4 C), temperature source Axillary, resp. rate 18, height 5' (1.524 m), weight 44.4 kg, SpO2 100%.    Vent Mode: PSV FiO2 (%):  [40 %] 40 % PEEP:  [5 cmH20] 5 cmH20 Pressure Support:  [5 cmH20] 5 cmH20   Intake/Output Summary (Last 24 hours) at 03/11/2024 0744 Last data filed at 03/11/2024 0700 Gross per 24 hour  Intake 449.43 ml  Output 1300 ml  Net -850.57 ml   Filed Weights   03/07/24 0500 03/08/24 0500 03/09/24 0450  Weight: 41.8 kg 44.2 kg 44.4 kg   Examination: General: Elderly frail cachectic appearing woman. Neuro: Off sedation.  Eyes are open and she moves purposefully . Intermittently following commands. HEENT: NCAT ETT secure anicteric sclera  Cardiovascular: Heart sounds are unremarkable. Lungs: Clear bilaterally.  Tolerating SBT. Abdomen: Scaphoid.  Soft. Musculoskeletal: chronic arthritic changes.  Decreased muscle mass. Skin: c/d/w   Labs/imaging personally reviewed:  CT head 4/9 > no acute intracranial abnormality  WBC 10.6, hemoglobin 8.9 Glycemic control adequate. 0.35 Cultures + for staphylococcus aureus. Assessment & Plan:   Acute encephalopathy superimposed on underlying chronic cognitive deficits  Seizure disorder with breakthrough seizure; status epilepticus  Hx depression  AoC resp failure with hypoxia and hypercarbia Hx COPD Hx tobacco use HTN HLD Protein calorie malnutrition FTT in adult GOC discussions  Plan: - Vimpat  100mg  twice daily, phenytoin 75 q8hrs, keppra 750mg  twice daily, gabapentin 100mg  TID. Will need medications switched to IV due to poor swallowing ability at this  time. - Dysphagia. Speech has evaluated with clears, but concerning for coughing by RN with meds. Swallow ability may wax/wane. - She has lost significant weight over recent months, will consult palliative care for failure to thrive.  - Complete 5 days of antibiotics for HCAP. Narrow antibiotics once cultures finalize. Staph aureus growing. - Continue bronchodilators.  Best practice (evaluated daily):  Diet/type: clear liquids  DVT prophylaxis: prophylactic heparin  Pressure ulcer(s): pressure ulcer assessment deferred  GI prophylaxis: PPI Lines: N/A Foley:  Yes, and it is still needed Code Status:  full code Last date of multidisciplinary goals of care discussion: Consult palliative care today   CRITICAL CARE Performed by: Clem Currier, MD [facility] Office: [phone]   See Amion for personal pager PCCM on call pager [phone] until 7pm. Please call Elink 7p-7a. [phone]

## 2024-03-12 ENCOUNTER — Inpatient Hospital Stay (HOSPITAL_COMMUNITY)

## 2024-03-12 DIAGNOSIS — Z7189 Other specified counseling: Secondary | ICD-10-CM

## 2024-03-12 DIAGNOSIS — Z515 Encounter for palliative care: Secondary | ICD-10-CM

## 2024-03-12 DIAGNOSIS — G40901 Epilepsy, unspecified, not intractable, with status epilepticus: Secondary | ICD-10-CM | POA: Diagnosis not present

## 2024-03-12 LAB — GLUCOSE, CAPILLARY
Glucose-Capillary: 112 mg/dL — ABNORMAL HIGH (ref 70–99)
Glucose-Capillary: 113 mg/dL — ABNORMAL HIGH (ref 70–99)
Glucose-Capillary: 118 mg/dL — ABNORMAL HIGH (ref 70–99)
Glucose-Capillary: 119 mg/dL — ABNORMAL HIGH (ref 70–99)
Glucose-Capillary: 119 mg/dL — ABNORMAL HIGH (ref 70–99)
Glucose-Capillary: 123 mg/dL — ABNORMAL HIGH (ref 70–99)

## 2024-03-12 LAB — CBC
HCT: 32.7 % — ABNORMAL LOW (ref 36.0–46.0)
Hemoglobin: 10.1 g/dL — ABNORMAL LOW (ref 12.0–15.0)
MCH: 28.6 pg (ref 26.0–34.0)
MCHC: 30.9 g/dL (ref 30.0–36.0)
MCV: 92.6 fL (ref 80.0–100.0)
Platelets: 258 10*3/uL (ref 150–400)
RBC: 3.53 MIL/uL — ABNORMAL LOW (ref 3.87–5.11)
RDW: 12.9 % (ref 11.5–15.5)
WBC: 6.4 10*3/uL (ref 4.0–10.5)
nRBC: 0 % (ref 0.0–0.2)

## 2024-03-12 LAB — PHOSPHORUS: Phosphorus: 3.4 mg/dL (ref 2.5–4.6)

## 2024-03-12 LAB — CULTURE, RESPIRATORY W GRAM STAIN

## 2024-03-12 LAB — BASIC METABOLIC PANEL WITH GFR
Anion gap: 8 (ref 5–15)
BUN: 14 mg/dL (ref 8–23)
CO2: 31 mmol/L (ref 22–32)
Calcium: 9.3 mg/dL (ref 8.9–10.3)
Chloride: 101 mmol/L (ref 98–111)
Creatinine, Ser: 0.42 mg/dL — ABNORMAL LOW (ref 0.44–1.00)
GFR, Estimated: >60 mL/min (ref >60)
Glucose, Bld: 122 mg/dL — ABNORMAL HIGH (ref 70–99)
Potassium: 3.7 mmol/L (ref 3.5–5.1)
Sodium: 140 mmol/L (ref 135–145)

## 2024-03-12 LAB — MAGNESIUM: Magnesium: 1.9 mg/dL (ref 1.7–2.4)

## 2024-03-12 MED ORDER — ACETAMINOPHEN 325 MG PO TABS
650.0000 mg | ORAL_TABLET | Freq: Four times a day (QID) | ORAL | Status: DC | PRN
  Administered 2024-03-12 – 2024-03-31 (×7): 650 mg
  Filled 2024-03-12 (×7): qty 2

## 2024-03-12 MED ORDER — ACETAMINOPHEN 650 MG RE SUPP: 650.0000 mg | Freq: Four times a day (QID) | RECTAL | Status: DC | PRN

## 2024-03-12 MED ORDER — SODIUM CHLORIDE 0.9 % IV SOLN
2.0000 g | INTRAVENOUS | Status: AC
  Administered 2024-03-12 – 2024-03-15 (×4): 2 g via INTRAVENOUS
  Filled 2024-03-12 (×4): qty 20

## 2024-03-12 MED ORDER — POTASSIUM CHLORIDE 20 MEQ PO PACK
40.0000 meq | PACK | Freq: Once | ORAL | Status: AC
  Administered 2024-03-12: 40 meq
  Filled 2024-03-12: qty 2

## 2024-03-12 MED ORDER — MAGNESIUM SULFATE 2 GM/50ML IV SOLN
2.0000 g | Freq: Once | INTRAVENOUS | Status: AC
  Administered 2024-03-12: 2 g via INTRAVENOUS
  Filled 2024-03-12: qty 50

## 2024-03-12 NOTE — Progress Notes (Signed)
EEG LTM D/C'd. No skin break down noted. [facility] notified.  

## 2024-03-12 NOTE — Procedures (Addendum)
 Patient Name: Chloe Wilcox  MRN: [id number]  Epilepsy Attending: Arleene Lack  Referring Physician/Provider: Khaliqdina, Salman, MD  Duration: 03/11/2024 2359 to 03/12/2024 1049   Patient history:  89 y.Chloe. female with recent diagnosis of seizures in November 2024 and discharged on Keppra 500 mg twice daily.  She she was brought into the ED today after she had a prolonged seizure at her facility.  Her seizure resolved clinically after 7.5 mg of Versed in the field, she had to be intubated after that for inability to protect her airway. EEG to evaluate for seizure   Level of alertness: awake, asleep   AEDs during EEG study: LEV, LCM, PHT, GBP   Technical aspects: This EEG study was done with scalp electrodes positioned according to the 10-20 International system of electrode placement. Electrical activity was reviewed with band pass filter of 1-70Hz , sensitivity of 7 uV/mm, display speed of 96mm/sec with a 60Hz  notched filter applied as appropriate. EEG data were recorded continuously and digitally stored.  Video monitoring was available and reviewed as appropriate.   Description: The posterior dominant rhythm consists of 8 Hz activity of moderate voltage (25-35 uV) seen predominantly in posterior head regions, asymmetric ( left<right) and reactive to eye opening and eye closing. Sleep was characterized by vertex waves, sleep spindles (12 to 14 Hz), maximal frontocentral region.  EEG showed continuous 3 to 6 Hz theta-delta slowing in left hemisphere, maximal left posterior quadrant. Lateralized periodic discharges were noted in left hemisphere at 0.5-1Hz ,at times with overriding fast activity .  ABNORMALITY - Lateralized periodic discharges with overriding fast activity, left hemisphere ( LPD+F) - Continuous slow, left hemisphere, maximal left posterior quadrant   IMPRESSION: This study showed evidence of epileptogenicity arising from left hemisphere. This EEG pattern is on the ictal-interictal  continuum with high risk of seizure recurrence.   Additionally there is cortical dysfunction in left hemisphere, maximal left posterior quadrant likely secondary to underlying structural abnormality, seizure.  No seizures were noted.   Chloe Wilcox Chloe Wilcox

## 2024-03-12 NOTE — Progress Notes (Addendum)
 Subjective: No acute events overnight.  No new concerns.  ROS: Unable to obtain due to poor mental status  Examination  Vital signs in last 24 hours: Temp:  [99.1 F (37.3 C)-99.9 F (37.7 C)] 99.5 F (37.5 C) (04/15 0000) Pulse Rate:  [64-95] 66 (04/15 0700) Resp:  [14-25] 19 (04/15 0700) BP: (60-171)/(31-114) 158/65 (04/15 0700) SpO2:  [92 %-100 %] 99 % (04/15 0700) Weight:  [44.2 kg] 44.2 kg (04/15 0500)  General: lying in bed, NAD Neuro: Drowsy, opens eyes to repeated tactile stimulation, able to tell me her name but did not answer the orientation questions or follow any commands, kept perseverating on her name again today, pupils did appear equally round and reactive to light, able to track me in the room, spontaneously moving all 4 extremities in bed  Basic Metabolic Panel: Recent Labs  Lab 03/06/24 2104 03/06/24 2238 03/07/24 0622 03/08/24 0514 03/09/24 0807 03/10/24 0724 03/12/24 0530  NA 144 142 142  --  138  --  140  K 4.1 4.0 3.9  --  4.0  --  3.7  CL 105  --  102  --  98  --  101  CO2 33*  --  27  --  32  --  31  GLUCOSE 116*  --  85  --  130*  --  122*  BUN 17  --  15  --  14  --  14  CREATININE 0.40*  --  0.38*  --  0.35*  --  0.42*  CALCIUM 9.6  --  9.8  --  9.2  --  9.3  MG  --   --  1.7 1.9 2.0 1.9 1.9  PHOS  --   --  2.7 2.8 2.8 3.4 3.4    CBC: Recent Labs  Lab 03/06/24 2104 03/06/24 2238 03/07/24 0622 03/09/24 0807 03/12/24 0530  WBC 7.9  --  8.5 10.6* 6.4  NEUTROABS 6.8  --   --   --   --   HGB 10.8* 10.5* 9.5* 8.9* 10.1*  HCT 36.6 31.0* 31.1* 28.3* 32.7*  MCV 97.6  --  95.7 93.1 92.6  PLT 265  --  237 233 258     Coagulation Studies: No results for input(s): "LABPROT", "INR" in the last 72 hours.  Imaging No new brain imaging overnight  ASSESSMENT AND PLAN: 89 y.o. female with hx of COPD on chronic oxygen, depression, hypertension, hyperlipidemia, iron deficiency anemia, right hip fracture, recent diagnosis of seizures in November  2024 and discharged on Keppra 500 mg twice daily.  She was brought into the ED after she had a prolonged seizure at her facility.  Her seizure resolved clinically after 7.5 mg of Versed in the field, she had to be intubated after that for inability to protect her airway.   She was discharged on Keppra 500 mg twice daily after she presented with first-time seizure back in November 2024.  However, it appears that she did was not taking that at her facility   Her phenytoin level is low, so I will give a small additional dose and increase her maintenance   Epilepsy with breakthrough seizure Fever, resolved - No further seizures   Recommendations - Continue phenytoin 100 mg nightly, 75 mg in the morning and afternoon - Continue Vimpat 100 mg twice daily, gabapentin 100mg  TID and Keppra 750 mg twice daily (maximal renal dose) - DC LTM EEG as no further seizures -Will try to start weaning medications if mental status  does not improve after 48 hours - Continue seizure precautions - PRN IV ativan for clinical seizures - Discussed plan with ICU team     I have spent a total of  26  minutes with the patient reviewing hospital notes,  test results, labs and examining the patient as well as establishing an assessment and plan. > 50% of time was spent in direct patient care.      Roxy Cordial Epilepsy [facility] For questions after 5pm please refer to AMION to reach the Neurologist on call

## 2024-03-12 NOTE — Progress Notes (Signed)
 Speech Language Pathology Treatment: Dysphagia  Patient Details Name: Chloe Wilcox MRN: [id number] DOB: 1935-08-15 Today's Date: 03/12/2024 Time: 4098-1191 SLP Time Calculation (min) (ACUTE ONLY): 10 min  Assessment / Plan / Recommendation Clinical Impression  Patient seen by SLP for skilled treatment focused on dysphagia goals. Patient was awake, alert, telling SLP she had to pee. She started to become resistant during oral care with suction toothbrush. With PO's, she would readily accept small amounts of chocolate pudding and apple juice pipetted from straw) but exhibited oral holding. Only one swallow initiation visualized. SLP then provided oral suction, removing chocolate pudding from oral cavity. Patient was very resistant to this and was becoming more agitated. SLP ended session and exited room. Patient continued with agitation and was attempting to take restraint mitts off. After SLP reclined head of bed to 30 degrees, patient started to calm down. SLP recommending NPO except sips of water when alert and accepting with SLP or RN only. Nutrition and medications through [facility]. SLP will continue to follow.   HPI HPI: Patient is an 89 y.o. female with PMH: recent diagnosis of seizures (November 2024), dementia, FTT, HTN, COPD, depression, dysphagia. She presented to the hospital on 03/07/23 from her SNF secondary to reduced responsiveness, twitching, right sided gaze. EMS called and she received IM Versed without improvement and seizures resolved approximately after approximately 45 minutes. In ED, she required intubation for airway protection. CT head was negative for bleed. MRI brain showed Chronic cortical/subcortical left MCA territory infarct again demonstrated within the mid and posterior left frontal lobe, left  insula and left parietal lobe  She was extubated 03/10/24. She was kept NPO awaiting SLP evaluation of swallow.      SLP Plan  Continue with current plan of care       Recommendations for follow up therapy are one component of a multi-disciplinary discharge planning process, led by the attending physician.  Recommendations may be updated based on patient status, additional functional criteria and insurance authorization.    Recommendations  Diet recommendations: NPO Medication Administration: Via alternative means                  Oral care BID;Staff/trained caregiver to provide oral care;Oral care prior to ice chip/H20   Frequent or constant Supervision/Assistance Dysphagia, unspecified (R13.10)     Continue with current plan of care     Jacqualine Mater, MA, CCC-SLP Speech Therapy

## 2024-03-12 NOTE — Progress Notes (Signed)
 [facility] ADULT ICU REPLACEMENT PROTOCOL   The patient does apply for the [facility] Adult ICU Electrolyte Replacment Protocol based on the criteria listed below:   1.Exclusion criteria: TCTS, ECMO, Dialysis, and Myasthenia Gravis patients 2. Is GFR >/= 30 ml/min? Yes.    Patient's GFR today is >60 3. Is SCr </= 2? Yes.   Patient's SCr is 0.42 mg/dL 4. Did SCr increase >/= 0.5 in 24 hours? No. 5.Pt's weight >40kg  Yes.   6. Abnormal electrolyte(s): potassium 3.7, mag 1.9  7. Electrolytes replaced per protocol 8.  Call MD STAT for K+ </= 2.5, Phos </= 1, or Mag </= 1 Physician:  protocol  Marva Sleight 03/12/2024 6:15 AM

## 2024-03-12 NOTE — Progress Notes (Signed)
 NAME:  Chloe Wilcox, MRN:  [id number], DOB:  [birth year], LOS: 6 ADMISSION DATE:  03/06/2024, CONSULTATION DATE:  03/06/24 REFERRING MD:  Andria Meuse CHIEF COMPLAINT:  Seizures   History of Present Illness:  Pt is encephelopathic; therefore, this HPI is obtained from chart review. Chloe Wilcox is a 89 y.o. female who has a PMH as below including but not limited to epilepsy, cognitive deficits, failure to thrive and who resides at a nursing home. She has a diagnosis of epilepsy and was supposed to be on Keppra 500mg  BID (listed on discharge summary from 10/02/23); however, she was apparently not on this at nursing facility for unclear reasons. She was seen normal at 6pm on evening of 03/06/24. When staff checked on her later, she was less responsive and was twitching with a right sided gaze.  EMS was called and administered IM Versed without improvement before establishing IV access and administering an additional Versed dose IV. There was roughly 45 minutes reported before seizures resolved.  She was brought to [facility] ED where she required intubation for airway protection. She had head CT which preliminary is negative for bleed. She was loaded with 1.5g Keppra and was seen by neurology who has started LTM. She is currently on a propofol infusion.  PCCM called for ICU admission.  Pertinent  Medical History:  has Acute respiratory failure with hypoxia (HCC); COPD (chronic obstructive pulmonary disease) (HCC); COPD with acute bronchitis (HCC); Hypokalemia; Essential hypertension; Respiratory failure (HCC); Hypoxemia; History of COVID-19; Abnormal findings on diagnostic imaging of lung; Closed displaced intertrochanteric fracture of right femur (HCC); Fall; Depression; Hyperlipidemia; Anemia of chronic disease; Acute metabolic encephalopathy; Protein-calorie malnutrition, severe; and Status epilepticus (HCC) on their problem list.  Significant Hospital Events: Including procedures, antibiotic start and stop dates  in addition to other pertinent events   4/9 admit 4/10 eeg w/o sz but did have evidence of epileptogenicity from L hemisphere. Weaned off prop per neuro   4/11 cEEG w 2 seizures w/o clinical signs overnight 4/13 extubated 4/14 on going seizure activity, dilantin increased. Cortrak placed, TF started  Interim History / Subjective:   No acute events overnight Cortrak placed yesterday, TF started Updated daughter on the phone. Let her know recommendation to consider DNR status with intubate only.   Objective:  Blood pressure (!) 158/65, pulse 66, temperature 99.5 F (37.5 C), temperature source Axillary, resp. rate 19, height 5' (1.524 m), weight 44.2 kg, SpO2 99%.        Intake/Output Summary (Last 24 hours) at 03/12/2024 0913 Last data filed at 03/12/2024 0700 Gross per 24 hour  Intake 1638.93 ml  Output 1550 ml  Net 88.93 ml   Filed Weights   03/08/24 0500 03/09/24 0450 03/12/24 0500  Weight: 44.2 kg 44.4 kg 44.2 kg   Examination: General: Elderly frail cachectic appearing woman. Neuro: opens eyes, not following commands HEENT: NCAT ETT secure anicteric sclera  Cardiovascular: Heart sounds are unremarkable. Lungs: Clear bilaterally Abdomen: Scaphoid.  Soft. Musculoskeletal: chronic arthritic changes.  Decreased muscle mass. Skin: c/d/w   Labs/imaging personally reviewed:  CT head 4/9 > no acute intracranial abnormality  WBC 10.6, hemoglobin 8.9 Glycemic control adequate. 0.35 Cultures + for staphylococcus aureus and haemophilus influenzae Assessment & Plan:   Acute encephalopathy superimposed on underlying chronic cognitive deficits  Seizure disorder with breakthrough seizure; status epilepticus  Hx depression  AoC resp failure with hypoxia and hypercarbia Hx COPD Hx tobacco use HTN HLD Protein calorie malnutrition FTT in adult GOC discussions  Plan: - Vimpat 100mg  twice daily, phenytoin 75mg  twice daily and 100mg  at bedtime, keppra 750mg  twice daily,  gabapentin 100mg  TID.  - Dysphagia. Cortrak placed. - She has lost significant weight over recent months, will consult palliative care for failure to thrive. Updated daughter that palliative care will be reaching out. Recommended DNR/DNI status.  - Complete 5 days of vanc/zosyn for HCAP. Narrow antibiotics once cultures finalize. Staph aureus and haemophilus influenza growing. - Continue bronchodilators. - If no further cEEG monitoring will plan to transfer out of ICU to progressive care unit.  Best practice (evaluated daily):  Diet/type: tubefeeds  DVT prophylaxis: prophylactic heparin  Pressure ulcer(s): pressure ulcer assessment deferred  GI prophylaxis: N/A Lines: N/A Foley:  N/A Code Status:  full code Last date of multidisciplinary goals of care discussion: Consult palliative care pending. Updated daughter via phone        Duaine German, MD [facility] Office: [phone]   See Amion for personal pager PCCM on call pager [phone] until 7pm. Please call Elink 7p-7a. [phone]

## 2024-03-12 NOTE — Progress Notes (Signed)
 eLink Physician-Brief Progress Note Patient Name: Chloe Wilcox DOB: 04-Apr-1935 MRN: [id number]   Date of Service  03/12/2024  HPI/Events of Note  89 year old female has a history of epilepsy and cognitive deficits with failure to thrive who presents with respiratory failure requiring intubation with concern for questionable seizure disorder.  Now extubated.  Appears to be in pain and nonverbal pain scale.  eICU Interventions  Add Tylenol as first for first-line analgesic     Intervention Category Intermediate Interventions: Pain - evaluation and management  Hoby Kawai 03/12/2024, 8:43 PM

## 2024-03-12 NOTE — TOC Progression Note (Signed)
 Transition of Care [facility]) - Progression Note    Patient Details  Name: Chloe Wilcox MRN: [id number] Date of Birth: Apr 08, 1935  [facility] CM/SW Contact  Jannice Mends, LCSW Phone Number: 03/12/2024, 11:59 AM  Clinical Narrative:    CSW received request that family is wanting a different LTC SNF. CSW spoke with patient's daughter who confirmed that they have concerns with patient returning to [facility] due to medication issues. CSW explained that it is difficult to find long term Medicaid beds but will ask facilities for availability. She reported understanding.    Expected Discharge Plan: Skilled Nursing Facility Barriers to Discharge: Continued Medical Work up  Expected Discharge Plan and Services In-house Referral: Clinical Social Work   Post Acute Care Choice: Skilled Nursing Facility Living arrangements for the past 2 months: Skilled Nursing Facility                                       Social Determinants of Health (SDOH) Interventions SDOH Screenings   Food Insecurity: Patient Unable To Answer (03/07/2024)  Housing: Patient Unable To Answer (03/07/2024)  Transportation Needs: Patient Unable To Answer (03/07/2024)  Utilities: Patient Unable To Answer (03/07/2024)  Social Connections: Patient Unable To Answer (03/07/2024)  Tobacco Use: Medium Risk (03/06/2024)    Readmission Risk Interventions     No data to display

## 2024-03-12 NOTE — Consult Note (Signed)
 Consultation Note Date: 03/12/2024   Patient Name: Chloe Wilcox  DOB: 02-24-1935  MRN: [id number]  Age / Sex: 89 y.o., female  PCP: Roselind Congo, MD Referring Physician: Wilfredo Hanly, MD  Reason for Consultation: Establishing goals of care  HPI/Patient Profile: 89 y.o. female  with past medical history of epilepsy, cognitive deficits, failure to thrive admitted on 03/06/2024 with twitching and right-sided gaze, less responsive.   EMS was called and administered IM Versed without improvement before establishing IV access and administering an additional Versed dose IV. There was roughly 45 minutes reported before seizures resolved. She was brought to [facility] ED where she required intubation for airway protection.  Extubated on 4/13 and currently still has ongoing seizure activity.  PMT has been consulted to assist with goals of care conversation in light of failure to thrive and chronic comorbidities.  Clinical Assessment and Goals of Care:  I have reviewed medical records including EPIC notes, labs and imaging, discussed with RT, assessed the patient and then had a phone conversation with patient's daughter Chloe Wilcox and granddaughter Chloe Wilcox to discuss diagnosis prognosis, GOC, EOL wishes, disposition and options.  I introduced Palliative Medicine as specialized medical care for people living with serious illness. It focuses on providing relief from the symptoms and stress of a serious illness. The goal is to improve quality of life for both the patient and the family.  We discussed a brief life review of the patient and then focused on their current illness.   I attempted to elicit values and goals of care important to the patient.    Medical History Review and Understanding:  We discussed patient's acute illness in the context of her chronic comorbidities.  Social History: Patient lives at [facility] [address].  She lives with her daughter and granddaughter  before this.  Her daughter is bedbound herself.  She only has 1 child, several nieces and nephews and her sister in [place]  that she is close to.  She has a strong faith in Christianity and her family is very important to her.  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  Functional and Nutritional State: Patient had a downhill decline after losing her husband of 50+ years in 2012.  Family reports she improved.  Had a stroke 5 years ago after which she bounced back 100% within days.  We discussed concern about her weight loss and another pattern of decline over the past few months.  Palliative Symptoms: None identified  Advance Directives: A detailed discussion regarding advanced directives was had.   Code Status: Concepts specific to code status, artifical feeding and hydration, and rehospitalization were considered and discussed.   Discussion: Patient was very tired during my attempt to discuss with her this morning.  Patient's family shared that while they have not discussed her wishes on specific interventions such as feeding tubes, she has always wanted to fight to live and "try, but do not hurt me real bad."  Family feels she would be open to what ever helps her progress and improve enough to be released from the hospital.  Patient feels like the [facility] has a plan for her and family do not feel very comfortable with having her life in their hands or making decisions for her.  They are open to an open and honest discussion if she ever indicated that she is tired of going through aggressive treatments or if they are hurting her more than they are helping. Emotional support and therapeutic  listening was provided as family shared the concerns regarding return to [facility].  They would prefer patient did not return there, as they never were notified of discontinuation of the AEDs on February 12.  This does not feel safe to them and they are struggling with the fact that  this whole situation could have been avoided. In light of patient's previous statement and care preferences as described above, I recommended consideration of DNR.  Discussed the risk of greater harm than benefit with CPR attempt, understanding evidenced-based poor outcomes in similar hospitalized patients, as the cause of the arrest is likely associated with chronic/terminal disease rather than a reversible acute cardio-pulmonary event.  Daughter is agreeable.  Granddaughter Chloe Wilcox is having a hard time making this call and defers to her mother.   The difference between aggressive medical intervention and comfort care was considered in light of the patient's goals of care. Hospice and Palliative Care services outpatient were explained and offered.   Discussed the importance of continued conversation with family and the medical providers regarding overall plan of care and treatment options, ensuring decisions are within the context of the patient's values and GOCs.   Questions and concerns were addressed.  Hard Choices booklet left for review. The family was encouraged to call with questions or concerns.  PMT will continue to support holistically.  SUMMARY OF RECOMMENDATIONS   -CODE STATUS changed to DNR after discussion with patient's family -Continue full scope treatment -Goal is for as much is improvement as possible -Family are adamantly against her return to [facility].  Appreciate TOC assistance with identifying alternative options -Psychosocial and emotional support provided -Ongoing goals of care discussions -PMT will continue to follow and support  Prognosis:  Unable to determine  Discharge Planning: To Be Determined      Primary Diagnoses: Present on Admission:  Status epilepticus [facility])    Physical Exam Vitals and nursing note reviewed.  Constitutional:      General: She is not in acute distress.    Appearance: She is ill-appearing.  Cardiovascular:     Rate and  Rhythm: Normal rate.  Pulmonary:     Effort: Pulmonary effort is normal.  Skin:    General: Skin is warm and dry.  Neurological:     Mental Status: She is lethargic.     Vital Signs: BP (!) 158/65   Pulse 66   Temp 99.5 F (37.5 C) (Axillary)   Resp 19   Ht 5' (1.524 m)   Wt 44.2 kg   SpO2 99%   BMI 19.03 kg/m  Pain Scale: CPOT       SpO2: SpO2: 99 % O2 Device:SpO2: 99 % O2 Flow Rate: .O2 Flow Rate (L/min): 2 L/min    MDM: high   Jodelle Fausto Jeni Salles, PA-C  Palliative Medicine Team Team phone # [phone]  Thank you for allowing the Palliative Medicine Team to assist in the care of this patient. Please utilize secure chat with additional questions, if there is no response within 30 minutes please call the above phone number.  Palliative Medicine Team providers are available by phone from 7am to 7pm daily and can be reached through the team cell phone.  Should this patient require assistance outside of these hours, please call the patient's attending physician.

## 2024-03-13 DIAGNOSIS — G40901 Epilepsy, unspecified, not intractable, with status epilepticus: Secondary | ICD-10-CM | POA: Diagnosis not present

## 2024-03-13 DIAGNOSIS — Z7189 Other specified counseling: Secondary | ICD-10-CM | POA: Diagnosis not present

## 2024-03-13 DIAGNOSIS — Z515 Encounter for palliative care: Secondary | ICD-10-CM | POA: Diagnosis not present

## 2024-03-13 LAB — GLUCOSE, CAPILLARY
Glucose-Capillary: 104 mg/dL — ABNORMAL HIGH (ref 70–99)
Glucose-Capillary: 85 mg/dL (ref 70–99)
Glucose-Capillary: 152 mg/dL — ABNORMAL HIGH (ref 70–99)
Glucose-Capillary: 122 mg/dL — ABNORMAL HIGH (ref 70–99)
Glucose-Capillary: 94 mg/dL (ref 70–99)
Glucose-Capillary: 120 mg/dL — ABNORMAL HIGH (ref 70–99)

## 2024-03-13 NOTE — Progress Notes (Addendum)
 Physical Therapy Treatment Patient Details Name: Chloe Wilcox MRN: [id number] DOB: 16-Oct-1935 Today's Date: 03/13/2024   History of Present Illness The pt is an 89 yo female presenting from [facility] 4/9 with seizure. Intubated for airway protection, extubated 4/13. PMH includes: epilepsy, cognitive deficits, failure to thrive, COPD, HTN, R femur fx, falls, HLD, and malnutrition.    PT Comments  Pt alert and agreeable to sit up in the chair. Pt requiring totalA for rolling to R/L for placement of lift pad and Maxisky utilized for transfer from bed to chair. Pt with noted bilateral knee extension contractures and per chart review is bed bound at baseline from LTC. No further acute PT needs. Recommend lift out of bed with nursing staff. Thank you for this consult.    If plan is discharge home, recommend the following: Two people to help with walking and/or transfers;Two people to help with bathing/dressing/bathroom   Can travel by private vehicle     No  Equipment Recommendations  None recommended by PT    Recommendations for Other Services       Precautions / Restrictions Precautions Precautions: Fall (seizure) Recall of Precautions/Restrictions: Impaired Restrictions Weight Bearing Restrictions Per Provider Order: No     Mobility  Bed Mobility Overal bed mobility: Needs Assistance Bed Mobility: Rolling Rolling: Total assist, +2 for physical assistance, +2 for safety/equipment         General bed mobility comments: TotalA for rolling to R/L for placement of maxi sky lift pad    Transfers                        Ambulation/Gait                   Stairs             Wheelchair Mobility     Tilt Bed    Modified Rankin (Stroke Patients Only)       Balance                                            Communication Communication Communication: Impaired Factors Affecting Communication: Reduced clarity of  speech;Difficulty expressing self  Cognition Arousal: Alert Behavior During Therapy: Flat affect   PT - Cognitive impairments: History of cognitive impairments, No family/caregiver present to determine baseline                         Following commands: Impaired Following commands impaired: Follows one step commands inconsistently    Cueing Cueing Techniques: Verbal cues, Gestural cues, Tactile cues, Visual cues  Exercises      General Comments General comments (skin integrity, edema, etc.): SpO2 reading as low as 80s during rolling in bed in supine position, however, unreliable PLETH. HR 70, BP 156/99      Pertinent Vitals/Pain Pain Assessment Pain Assessment: No/denies pain    Home Living Family/patient expects to be discharged to:: Skilled nursing facility                   Additional Comments: pt from [facility]    Prior Function            PT Goals (current goals can now be found in the care plan section) Acute Rehab PT Goals Potential to Achieve Goals: Fair Progress towards PT goals: Not  progressing toward goals - comment (d/c from acute PT)    Frequency    Min 2X/week      PT Plan      Co-evaluation              AM-PAC PT "6 Clicks" Mobility   Outcome Measure  Help needed turning from your back to your side while in a flat bed without using bedrails?: Total Help needed moving from lying on your back to sitting on the side of a flat bed without using bedrails?: Total Help needed moving to and from a bed to a chair (including a wheelchair)?: Total Help needed standing up from a chair using your arms (e.g., wheelchair or bedside chair)?: Total Help needed to walk in hospital room?: Total Help needed climbing 3-5 steps with a railing? : Total 6 Click Score: 6    End of Session   Activity Tolerance: Patient tolerated treatment well Patient left: in chair;with call bell/phone within reach;with chair alarm set Nurse  Communication: Mobility status;Need for lift equipment PT Visit Diagnosis: Muscle weakness (generalized) (M62.81)     Time: 0454-0981 PT Time Calculation (min) (ACUTE ONLY): 25 min  Charges:    $Therapeutic Activity: 8-22 mins PT General Charges $$ ACUTE PT VISIT: 1 Visit                     Verdia Glad, PT, DPT Acute Rehabilitation Services Office [phone]    Claria Crofts 03/13/2024, 10:05 AM

## 2024-03-13 NOTE — Progress Notes (Addendum)
 NAME:  Chloe Wilcox, MRN:  [id number], DOB:  [birth year], LOS: 7 ADMISSION DATE:  03/06/2024, CONSULTATION DATE:  03/06/24 REFERRING MD:  Andria Meuse CHIEF COMPLAINT:  Seizures   History of Present Illness:  Pt is encephelopathic; therefore, this HPI is obtained from chart review. Chloe Wilcox is a 89 y.o. female who has a PMH as below including but not limited to epilepsy, cognitive deficits, failure to thrive and who resides at a nursing home. She has a diagnosis of epilepsy and was supposed to be on Keppra 500mg  BID (listed on discharge summary from 10/02/23); however, she was apparently not on this at nursing facility for unclear reasons. She was seen normal at 6pm on evening of 03/06/24. When staff checked on her later, she was less responsive and was twitching with a right sided gaze.  EMS was called and administered IM Versed without improvement before establishing IV access and administering an additional Versed dose IV. There was roughly 45 minutes reported before seizures resolved.  She was brought to [facility] ED where she required intubation for airway protection. She had head CT which preliminary is negative for bleed. She was loaded with 1.5g Keppra and was seen by neurology who has started LTM. She is currently on a propofol infusion.  PCCM called for ICU admission.  Pertinent  Medical History:  has Acute respiratory failure with hypoxia (HCC); COPD (chronic obstructive pulmonary disease) (HCC); COPD with acute bronchitis (HCC); Hypokalemia; Essential hypertension; Respiratory failure (HCC); Hypoxemia; History of COVID-19; Abnormal findings on diagnostic imaging of lung; Closed displaced intertrochanteric fracture of right femur (HCC); Fall; Depression; Hyperlipidemia; Anemia of chronic disease; Acute metabolic encephalopathy; Protein-calorie malnutrition, severe; and Status epilepticus (HCC) on their problem list.  Significant Hospital Events: Including procedures, antibiotic start and stop dates  in addition to other pertinent events   4/9 admit 4/10 eeg w/o sz but did have evidence of epileptogenicity from L hemisphere. Weaned off prop per neuro   4/11 cEEG w 2 seizures w/o clinical signs overnight 4/13 extubated 4/14 on going seizure activity, dilantin increased. Cortrak placed, TF started  Interim History / Subjective:   Some pain/agitation overnight, tylenol added PRN More awake this morning  Objective:  Blood pressure (!) 166/69, pulse (!) 57, temperature 98.4 F (36.9 C), temperature source Axillary, resp. rate 17, height 5' (1.524 m), weight 44.4 kg, SpO2 100%.        Intake/Output Summary (Last 24 hours) at 03/13/2024 0759 Last data filed at 03/13/2024 4098 Gross per 24 hour  Intake 1494.1 ml  Output 1100 ml  Net 394.1 ml   Filed Weights   03/09/24 0450 03/12/24 0500 03/13/24 0500  Weight: 44.4 kg 44.2 kg 44.4 kg   Examination: General: Elderly frail cachectic appearing woman. Neuro: conversive, not oriented to place or time. Moving extremities. HEENT: [place]/AT, sclera anicteric Cardiovascular: Heart sounds are unremarkable. Lungs: Clear bilaterally Abdomen: soft, non-distended, non-tender Musculoskeletal: chronic arthritic changes.  Decreased muscle mass. Skin: c/d/w   Labs/imaging personally reviewed:  CT head 4/9 > no acute intracranial abnormality  Resp Cultures MSSA and haemophilus influenzae  Assessment & Plan:   Acute encephalopathy superimposed on underlying chronic cognitive deficits  Seizure disorder with breakthrough seizure; status epilepticus  Hx depression  AoC resp failure with hypoxia and hypercarbia Pneumonia due to MSSA and Haemophilus influenza Hx COPD Hx tobacco use HTN HLD Protein calorie malnutrition FTT in adult GOC discussions  Plan: - Vimpat 100mg  twice daily, phenytoin 75mg  twice daily and 100mg  at bedtime, keppra 750mg   twice daily, gabapentin 100mg  TID.  - Neurology titrating AEDs - cEEG discontinued yesterday -  Dysphagia. Cortrak placed. Tube feeds running. - Palliative met with family yesterday, patient is DNR - Intubate only. - Continue antibiotic coverage with ceftriaxone. MSSA and haemophilus influenza growing from sputum sample. - Continue bronchodilators. - safe for transfer out of ICU.  Best practice (evaluated daily):   Diet/type: tubefeeds  DVT prophylaxis: prophylactic heparin  Pressure ulcer(s): pressure ulcer assessment deferred  GI prophylaxis: N/A Lines: N/A Foley:  N/A Code Status:  full code Last date of multidisciplinary goals of care discussion: Consult palliative care pending. Updated daughter via phone        Duaine German, MD [facility] Office: [phone]   See Amion for personal pager PCCM on call pager [phone] until 7pm. Please call Elink 7p-7a. [phone]

## 2024-03-13 NOTE — Progress Notes (Signed)
 Daily Progress Note   Patient Name: Chloe Wilcox       Date: 03/13/2024 DOB: 09/07/1935  Age: 89 y.o. MRN#: [id number] Attending Physician: Wilfredo Hanly, MD Primary Care Physician: Roselind Congo, MD Admit Date: 03/06/2024  Reason for Consultation/Follow-up: Establishing goals of care  Subjective: Medical records reviewed including progress notes, labs, imaging. Patient assessed at the bedside.  She is sitting in chair, confused and disoriented.  No family present during my visit.  Called patient's daughter Chloe Wilcox for ongoing goals of care discussions and palliative support.  Created space and opportunity for her thoughts and feelings on patient's current.  She has strong faith and she is very hopeful patient will recover.  Patient's sister from out of town, who she loves dearly, will be visiting later this afternoon.  She feels that this will be beneficial. She has no other needs or follow up questions at this time.  Questions and concerns addressed. PMT will continue to support holistically.   Length of Stay: 7   Physical Exam Vitals and nursing note reviewed.  Constitutional:      Appearance: She is ill-appearing.  Cardiovascular:     Rate and Rhythm: Normal rate.  Pulmonary:     Effort: Pulmonary effort is normal.  Neurological:     Mental Status: She is disoriented and confused.  Psychiatric:        Behavior: Behavior normal.            Vital Signs: BP (!) 166/69   Pulse (!) 57   Temp 98.4 F (36.9 C) (Axillary)   Resp 17   Ht 5' (1.524 m)   Wt 44.4 kg   SpO2 100%   BMI 19.12 kg/m  SpO2: SpO2: 100 % O2 Device: O2 Device: Nasal Cannula O2 Flow Rate: O2 Flow Rate (L/min): 2 L/min      Palliative Assessment/Data: 30%   Palliative Care Assessment & Plan   Patient Profile: 89 y.o. female  with past  medical history of epilepsy, cognitive deficits, failure to thrive admitted on 03/06/2024 with twitching and right-sided gaze, less responsive.    EMS was called and administered IM Versed without improvement before establishing IV access and administering an additional Versed dose IV. There was roughly 45 minutes reported before seizures resolved. She was brought to [facility] ED where she required intubation for airway protection.  Extubated on 4/13 and currently still has ongoing seizure activity.   PMT has been consulted to assist with goals of care conversation in light of failure to thrive and chronic comorbidities.  Assessment: Goals of care conversation Acute encephalopathy Seizure disorder  with breakthrough seizures/status epilepticus Acute on chronic respiratory failure Pneumonia  Recommendations/Plan: Continue DNR. Will sign gold form for discharge and scan copy into EMR Continue full scope treatment Goal is for improvement and transition to an alternative SNF/LTC facility Patient's family would consider de-escalation of aggressive life-prolonging care if patient voiced this desire, or if it became evident to them that treatments would cause more harm than benefit Ongoing GOC discussions pending clinical course PMT will continue to follow peripherally and visit incrementally as needed   Prognosis:  Guarded  Discharge Planning: To Be Determined  Care plan was discussed with Patient, patient's daughter   Total time: I spent 35 minutes in the care of the patient today in the above activities and documenting the encounter.          Emeril Stille P Julyan Gales, PA-C  Palliative Medicine Team Team phone # [phone]  Thank you for allowing the Palliative Medicine Team to assist in the care of this patient. Please utilize secure chat with additional questions, if there is no response within 30 minutes please call the above phone number.  Palliative Medicine Team providers are available  by phone from 7am to 7pm daily and can be reached through the team cell phone.  Should this patient require assistance outside of these hours, please call the patient's attending physician.

## 2024-03-13 NOTE — Evaluation (Signed)
 Occupational Therapy Evaluation Patient Details Name: Chloe Wilcox MRN: [id number] DOB: 19-Feb-1935 Today's Date: 03/13/2024   History of Present Illness   The pt is an 89 yo female presenting from [facility] 4/9 with seizure. Intubated for airway protection, extubated 4/13. PMH includes: epilepsy, cognitive deficits, failure to thrive, COPD, HTN, R femur fx, falls, HLD, and malnutrition.     Clinical Impressions Per chart, pt from [facility]; no family present to determine baseline, but per chart bed bound. Pt with extension contractures at bil knees and max-total A +2 at prior admission 09/2023. Pt requiring total A for rolling in bed this date; able to inconsistently follow commands, not oriented but is pleasant. Recommend return to long term care. No further acute OT needs. OT to sign off.       If plan is discharge home, recommend the following:   A lot of help with bathing/dressing/bathroom;Two people to help with walking and/or transfers;Assistance with cooking/housework;Assistance with feeding;Direct supervision/assist for medications management;Assist for transportation;Direct supervision/assist for financial management;Help with stairs or ramp for entrance     Functional Status Assessment   Patient has had a recent decline in their functional status and demonstrates the ability to make significant improvements in function in a reasonable and predictable amount of time.     Equipment Recommendations   None recommended by OT     Recommendations for Other Services         Precautions/Restrictions   Precautions Precautions: Fall (seizure) Recall of Precautions/Restrictions: Impaired Restrictions Weight Bearing Restrictions Per Provider Order: No     Mobility Bed Mobility Overal bed mobility: Needs Assistance Bed Mobility: Rolling Rolling: Total assist, +2 for physical assistance, +2 for safety/equipment              Transfers Overall transfer  level: Needs assistance Equipment used: Ambulation equipment used Transfers: Bed to chair/wheelchair/BSC             General transfer comment: Pt bed soiled on arrival; used maxisky for transfer to chair. pt observed with BLE extension contractures at knee Transfer via Lift Equipment: Maxisky    Balance                                           ADL either performed or assessed with clinical judgement   ADL Overall ADL's : Needs assistance/impaired     Grooming: Bed level;Maximal assistance                                 General ADL Comments: total care     Vision Patient Visual Report: No change from baseline Additional Comments: pt holds R eye closed intermittently but denies change in vision     Perception         Praxis         Pertinent Vitals/Pain Pain Assessment Pain Assessment: PAINAD Breathing: normal Negative Vocalization: none Facial Expression: smiling or inexpressive Body Language: relaxed Consolability: distracted or reassured by voice/touch PAINAD Score: 1 Pain Location: generalized with rolling/moving Pain Descriptors / Indicators: Grimacing, Discomfort Pain Intervention(s): Limited activity within patient's tolerance, Monitored during session     Extremity/Trunk Assessment Upper Extremity Assessment Upper Extremity Assessment: Difficult to assess due to impaired cognition;Generalized weakness (frail, low muscle bulk, not following commands well enough for formal MMT. Has some ROM  in BUE able to grasp items and lift arms against gravity with increased time)   Lower Extremity Assessment Lower Extremity Assessment: Defer to PT evaluation   Cervical / Trunk Assessment Cervical / Trunk Assessment: Kyphotic   Communication Communication Communication: Impaired Factors Affecting Communication: Reduced clarity of speech;Difficulty expressing self   Cognition Arousal: Alert Behavior During Therapy: Flat  affect Cognition: No family/caregiver present to determine baseline                               Following commands: Impaired Following commands impaired: Follows one step commands inconsistently     Cueing  General Comments   Cueing Techniques: Verbal cues;Gestural cues;Tactile cues;Visual cues  SpO2 reading as low as 80s during rolling in bed in supine position, however, unreliable PLETH   Exercises     Shoulder Instructions      Home Living Family/patient expects to be discharged to:: Skilled nursing facility                                 Additional Comments: pt from [facility]      Prior Functioning/Environment Prior Level of Function : Needs assist;Patient poor historian/Family not available             Mobility Comments: pt unable to report - anticipate bed bound ADLs Comments: unable to report - anticipate ADLs with staff assist at bed level    OT Problem List: Decreased strength;Decreased activity tolerance;Decreased range of motion;Decreased cognition;Decreased knowledge of use of DME or AE   OT Treatment/Interventions:        OT Goals(Current goals can be found in the care plan section)   Acute Rehab OT Goals Patient Stated Goal: none stated OT Goal Formulation: With patient   OT Frequency:       Co-evaluation              AM-PAC OT "6 Clicks" Daily Activity     Outcome Measure Help from another person eating meals?: Total (cortrak) Help from another person taking care of personal grooming?: A Lot Help from another person toileting, which includes using toliet, bedpan, or urinal?: Total Help from another person bathing (including washing, rinsing, drying)?: Total Help from another person to put on and taking off regular upper body clothing?: Total Help from another person to put on and taking off regular lower body clothing?: Total 6 Click Score: 7   End of Session Equipment Utilized During Treatment: Other  (comment) Calla Catchings) Nurse Communication: Mobility status;Need for lift equipment  Activity Tolerance: Patient tolerated treatment well Patient left: in chair;with call bell/phone within reach;with chair alarm set  OT Visit Diagnosis: Unsteadiness on feet (R26.81);Muscle weakness (generalized) (M62.81);Other symptoms and signs involving cognitive function                Time: 8657-8469 OT Time Calculation (min): 23 min Charges:  OT General Charges $OT Visit: 1 Visit OT Evaluation $OT Eval Moderate Complexity: 1 Mod  Karilyn Ouch, OTR/L [facility] Acute Rehabilitation Office: [phone]   [name] 03/13/2024, 9:28 AM

## 2024-03-13 NOTE — Progress Notes (Signed)
 Subjective: Had some agitation overnight.  No new concerns.  Was sitting up in recliner with 1 sister and 2 nieces at bedside today.  ROS: Unable to obtain due to poor mental status  Examination  Vital signs in last 24 hours: Temp:  [98.4 F (36.9 C)-98.9 F (37.2 C)] 98.4 F (36.9 C) (04/16 0400) Pulse Rate:  [55-88] 77 (04/16 1000) Resp:  [12-25] 25 (04/16 1000) BP: (125-181)/(58-136) 125/108 (04/16 1000) SpO2:  [92 %-100 %] 100 % (04/16 1000) Weight:  [44.4 kg] 44.4 kg (04/16 0500)  General: Sitting in recliner, not in apparent distress Neuro: Awake, alert, able to tell me her name and her sisters name, after that perseverated on her sister's name, not oriented to place or time, unable to name objects, did not follow commands except maybe wiggled her toes once, able to count fingers, cranial nerves appear grossly intact, antigravity strength in all 4 extremities  Basic Metabolic Panel: Recent Labs  Lab 03/06/24 2104 03/06/24 2238 03/07/24 0622 03/08/24 0514 03/09/24 0807 03/10/24 0724 03/12/24 0530  NA 144 142 142  --  138  --  140  K 4.1 4.0 3.9  --  4.0  --  3.7  CL 105  --  102  --  98  --  101  CO2 33*  --  27  --  32  --  31  GLUCOSE 116*  --  85  --  130*  --  122*  BUN 17  --  15  --  14  --  14  CREATININE 0.40*  --  0.38*  --  0.35*  --  0.42*  CALCIUM 9.6  --  9.8  --  9.2  --  9.3  MG  --   --  1.7 1.9 2.0 1.9 1.9  PHOS  --   --  2.7 2.8 2.8 3.4 3.4    CBC: Recent Labs  Lab 03/06/24 2104 03/06/24 2238 03/07/24 0622 03/09/24 0807 03/12/24 0530  WBC 7.9  --  8.5 10.6* 6.4  NEUTROABS 6.8  --   --   --   --   HGB 10.8* 10.5* 9.5* 8.9* 10.1*  HCT 36.6 31.0* 31.1* 28.3* 32.7*  MCV 97.6  --  95.7 93.1 92.6  PLT 265  --  237 233 258     Coagulation Studies: No results for input(s): "LABPROT", "INR" in the last 72 hours.  Imaging No new brain imaging overnight   ASSESSMENT AND PLAN: 89 y.o. female with hx of COPD on chronic oxygen, depression,  hypertension, hyperlipidemia, iron deficiency anemia, right hip fracture, recent diagnosis of seizures in November 2024 and discharged on Keppra 500 mg twice daily.  She was brought into the ED after she had a prolonged seizure at her facility.  Her seizure resolved clinically after 7.5 mg of Versed in the field, she had to be intubated after that for inability to protect her airway.   She was discharged on Keppra 500 mg twice daily after she presented with first-time seizure back in November 2024.  However, it appears that she did was not taking that at her facility   Epilepsy with breakthrough seizure Fever, resolved - No further seizures   Recommendations - Continue phenytoin 100 mg nightly, 75 mg in the morning and afternoon.  Will check phenytoin level tomorrow morning - Continue Vimpat 100 mg twice daily, gabapentin 100mg  TID and Keppra 750 mg twice daily (maximal renal dose) - Continue seizure precautions - PRN IV ativan for clinical  seizures - Discussed plan with patient's family at bedside     I have spent a total of  27  minutes with the patient reviewing hospital notes,  test results, labs and examining the patient as well as establishing an assessment and plan. > 50% of time was spent in direct patient care.        Roxy Cordial Epilepsy [facility] For questions after 5pm please refer to AMION to reach the Neurologist on call

## 2024-03-14 DIAGNOSIS — G40901 Epilepsy, unspecified, not intractable, with status epilepticus: Secondary | ICD-10-CM | POA: Diagnosis not present

## 2024-03-14 LAB — GLUCOSE, CAPILLARY
Glucose-Capillary: 114 mg/dL — ABNORMAL HIGH (ref 70–99)
Glucose-Capillary: 121 mg/dL — ABNORMAL HIGH (ref 70–99)
Glucose-Capillary: 118 mg/dL — ABNORMAL HIGH (ref 70–99)
Glucose-Capillary: 117 mg/dL — ABNORMAL HIGH (ref 70–99)
Glucose-Capillary: 118 mg/dL — ABNORMAL HIGH (ref 70–99)
Glucose-Capillary: 95 mg/dL (ref 70–99)

## 2024-03-14 LAB — ALBUMIN: Albumin: 3 g/dL — ABNORMAL LOW (ref 3.5–5.0)

## 2024-03-14 LAB — PHENYTOIN LEVEL, TOTAL: Phenytoin Lvl: 3.8 ug/mL — ABNORMAL LOW (ref 10.0–20.0)

## 2024-03-14 MED ORDER — LORAZEPAM 2 MG/ML IJ SOLN
0.5000 mg | Freq: Once | INTRAMUSCULAR | Status: AC
  Administered 2024-03-14: 0.5 mg via INTRAVENOUS
  Filled 2024-03-14: qty 1

## 2024-03-14 MED ORDER — LACOSAMIDE 50 MG PO TABS
100.0000 mg | ORAL_TABLET | Freq: Two times a day (BID) | ORAL | Status: DC
  Administered 2024-03-14 – 2024-03-22 (×16): 100 mg
  Filled 2024-03-14 (×17): qty 2

## 2024-03-14 MED ORDER — LEVETIRACETAM 750 MG PO TABS
750.0000 mg | ORAL_TABLET | Freq: Two times a day (BID) | ORAL | Status: DC
  Administered 2024-03-14 – 2024-03-22 (×16): 750 mg
  Filled 2024-03-14 (×16): qty 1

## 2024-03-14 NOTE — Plan of Care (Signed)

## 2024-03-14 NOTE — Care Management Important Message (Signed)
 Important Message  Patient Details  Name: Chloe Wilcox MRN: [id number] Date of Birth: 09/25/1935   Important Message Given:  Yes - Medicare IM     Felix Host 03/14/2024, 12:50 PM

## 2024-03-14 NOTE — Progress Notes (Signed)
  Progress Note   Patient: Chloe Wilcox ZOX:[id number] DOB: [birth year] DOA: 03/06/2024     8 DOS: the patient was seen and examined on 03/14/2024   Brief hospital course: 88yo with hx seizures, cognitive deficits, failure to thrive who presented from LTC with recurrent seizures requiring intubation for airway protection. Neurology was consulted. Seizures were ultimately controlled and pt extubated. Care transferred to [facility] 4/17  Assessment and Plan: Acute toxic metabolic encephalopathy  -Pt conversant, confused this AM, calling out for help  Seizures with status epilepticus -Neurology had been following -recs to continue phyenytoin 100mg  qhs, 75mg  qam -cont Vimpat 100mg  bid, gabapentin 100mg  TID, Keppra 750mg  bid -Neurology to f/u in 2-3 mos, Neurology has since signed off  Acute on Chronic respiratory failure with hypoxia and hypercarbia with COPD -currently continued on [facility] -Does not appear sob  PNA secondary to MSSA and H.flu  -On rocephin through 4/18 -seems stable at this time -no leukocytosis, afebrile  HTN -BP stable and controlled -cont norvasc, avapro  HLD -cont statin  Severe protein calorie malnutrition -currently remains on tube feed -SLP and dietitian following     Subjective: Pleasantly confused. Asking for help  Physical Exam: Vitals:   03/14/24 0817 03/14/24 0818 03/14/24 1148 03/14/24 1640  BP:   122/66 119/83  Pulse:   82 90  Resp:   16 19  Temp:   98.8 F (37.1 C) 98.3 F (36.8 C)  TempSrc:   Oral Oral  SpO2: 100% 100% 100% 100%  Weight:      Height:       General exam: Awake, laying in bed, in nad Respiratory system: Normal respiratory effort, no wheezing Cardiovascular system: regular rate, s1, s2 Gastrointestinal system: Soft, nondistended, positive BS Central nervous system: CN2-12 grossly intact, strength intact Extremities: Perfused, no clubbing Skin: Normal skin turgor, no notable skin lesions seen Psychiatry: Mood normal // no  visual hallucinations   Data Reviewed:  Labs reviewed: Na 140, K 3.7, Cr 0.42, WBC 6.4, Hgb 10.1, Plts 258  Family Communication: Pt in room, family not at bedside  Disposition: Status is: Inpatient Remains inpatient appropriate because: severity of illness  Planned Discharge Destination: Skilled nursing facility    Author: Cherylle Corwin, MD 03/14/2024 4:53 PM  For on call review [email or web address].

## 2024-03-14 NOTE — Progress Notes (Signed)
 Subjective: No acute events overnight.  Still aphasic  ROS: Unable to obtain due to poor mental status/aphasia  Examination  Vital signs in last 24 hours: Temp:  [98 F (36.7 C)-99.3 F (37.4 C)] 98.5 F (36.9 C) (04/17 0809) Pulse Rate:  [66-82] 82 (04/17 0809) Resp:  [13-23] 16 (04/17 0809) BP: (95-150)/(55-122) 107/64 (04/17 0809) SpO2:  [99 %-100 %] 100 % (04/17 0818) Weight:  [43.9 kg] 43.9 kg (04/17 0500)  General: lying in bed, NAD Neuro: Drowsy, opens eyes to repeated tactile stimulation, able to tell me her name but did not answer the orientation questions or follow any commands, kept perseverating on her name again today, pupils did appear equally round and reactive to light, able to track me in the room, spontaneously moving all 4 extremities in bed    Basic Metabolic Panel: Recent Labs  Lab 03/08/24 0514 03/09/24 0807 03/10/24 0724 03/12/24 0530  NA  --  138  --  140  K  --  4.0  --  3.7  CL  --  98  --  101  CO2  --  32  --  31  GLUCOSE  --  130*  --  122*  BUN  --  14  --  14  CREATININE  --  0.35*  --  0.42*  CALCIUM  --  9.2  --  9.3  MG 1.9 2.0 1.9 1.9  PHOS 2.8 2.8 3.4 3.4    CBC: Recent Labs  Lab 03/09/24 0807 03/12/24 0530  WBC 10.6* 6.4  HGB 8.9* 10.1*  HCT 28.3* 32.7*  MCV 93.1 92.6  PLT 233 258     Coagulation Studies: No results for input(s): "LABPROT", "INR" in the last 72 hours.  Imaging No new brain imaging overnight   ASSESSMENT AND PLAN: 89 y.o. female with hx of COPD on chronic oxygen, depression, hypertension, hyperlipidemia, iron deficiency anemia, right hip fracture, recent diagnosis of seizures in November 2024 and discharged on Keppra 500 mg twice daily.  She was brought into the ED after she had a prolonged seizure at her facility.  Her seizure resolved clinically after 7.5 mg of Versed in the field, she had to be intubated after that for inability to protect her airway.   She was discharged on Keppra 500 mg twice daily  after she presented with first-time seizure back in November 2024.  However, it appears that she did was not taking that at her facility   Her phenytoin level is low, so I will give a small additional dose and increase her maintenance   Epilepsy with breakthrough seizure Fever, resolved Postictal aphasia - No further seizures.  However patient continues to have postictal aphasia.  Patient likely has poor neurocognitive reserve and had prolonged seizures.  Therefore this may take a prolonged period of time (weeks) to improve   Recommendations - Continue phenytoin 100 mg nightly, 75 mg in the morning and afternoon - Continue Vimpat 100 mg twice daily, gabapentin 100mg  TID and Keppra 750 mg twice daily (maximal renal dose) - Continue seizure precautions - PRN IV ativan for clinical seizures -Recommend follow-up with neurology in 2 to 3 months (order placed) - Neurology will sign off.  Please call us for any further questions.  Seizure precautions: Per [facility] statutes, patients with seizures are not allowed to drive until they have been seizure-free for six months and cleared by a physician    Use caution when using heavy equipment or power tools. Avoid working on  ladders or at heights. Take showers instead of baths. Ensure the water temperature is not too high on the home water heater. Do not go swimming alone. Do not lock yourself in a room alone (i.e. bathroom). When caring for infants or small children, sit down when holding, feeding, or changing them to minimize risk of injury to the child in the event you have a seizure. Maintain good sleep hygiene. Avoid alcohol.    If patient has another seizure, call 911 and bring them back to the ED if: A.  The seizure lasts longer than 5 minutes.      B.  The patient doesn't wake shortly after the seizure or has new problems such as difficulty seeing, speaking or moving following the seizure C.  The patient was injured during the  seizure D.  The patient has a temperature over 102 F (39C) E.  The patient vomited during the seizure and now is having trouble breathing    During the Seizure   - First, ensure adequate ventilation and place patients on the floor on their left side  Loosen clothing around the neck and ensure the airway is patent. If the patient is clenching the teeth, do not force the mouth open with any object as this can cause severe damage - Remove all items from the surrounding that can be hazardous. The patient may be oblivious to what's happening and may not even know what he or she is doing. If the patient is confused and wandering, either gently guide him/her away and block access to outside areas - Reassure the individual and be comforting - Call 911. In most cases, the seizure ends before EMS arrives. However, there are cases when seizures may last over 3 to 5 minutes. Or the individual may have developed breathing difficulties or severe injuries. If a pregnant patient or a person with diabetes develops a seizure, it is prudent to call an ambulance.     After the Seizure (Postictal Stage)   After a seizure, most patients experience confusion, fatigue, muscle pain and/or a headache. Thus, one should permit the individual to sleep. For the next few days, reassurance is essential. Being calm and helping reorient the person is also of importance.   Most seizures are painless and end spontaneously. Seizures are not harmful to others but can lead to complications such as stress on the lungs, brain and the heart. Individuals with prior lung problems may develop labored breathing and respiratory distress.           I have spent a total of  27  minutes with the patient reviewing hospital notes,  test results, labs and examining the patient as well as establishing an assessment and plan. > 50% of time was spent in direct patient care.   Roxy Cordial Epilepsy [facility] For questions  after 5pm please refer to AMION to reach the Neurologist on call

## 2024-03-14 NOTE — Hospital Course (Signed)
 [phone] with hx seizures, cognitive deficits, failure to thrive who presented from LTC with recurrent seizures requiring intubation for airway protection. Neurology was consulted. Seizures were ultimately controlled and pt extubated. Care transferred to [facility] 4/17

## 2024-03-14 NOTE — TOC Initial Note (Signed)
 Transition of Care [facility]) - Initial/Assessment Note    Patient Details  Name: Chloe Wilcox MRN: [id number] Date of Birth: 1935-07-15  [facility] CM/SW Contact:    Eduard Roux, LCSW Phone Number: 03/14/2024, 2:02 PM  Clinical Narrative:          CSW spoke with patient's daughter and granddaughter, CSW introduced self and explained role.  Family confirmed patient arrived from [facility] and is agreeable to her returning once stable.   TOC will continue to follow and assist with discharge planning.  Antony Blackbird, MSW, LCSW Clinical Social Worker             Expected Discharge Plan: Skilled Nursing Facility Barriers to Discharge: Continued Medical Work up   Patient Goals and CMS Choice            Expected Discharge Plan and Services In-house Referral: Clinical Social Work   Post Acute Care Choice: Skilled Nursing Facility Living arrangements for the past 2 months: Skilled Nursing Facility                                      Prior Living Arrangements/Services Living arrangements for the past 2 months: Skilled Nursing Facility Lives with:: Facility Resident Patient language and need for interpreter reviewed:: No        Need for Family Participation in Patient Care: Yes (Comment) Care giver support system in place?: Yes (comment)   Criminal Activity/Legal Involvement Pertinent to Current Situation/Hospitalization: No - Comment as needed  Activities of Daily Living   ADL Screening (condition at time of admission) Independently performs ADLs?: No Does the patient have a NEW difficulty with bathing/dressing/toileting/self-feeding that is expected to last >3 days?: Yes (Initiates electronic notice to provider for possible OT consult) Does the patient have a NEW difficulty with getting in/out of bed, walking, or climbing stairs that is expected to last >3 days?: Yes (Initiates electronic notice to provider for possible PT consult) Does the  patient have a NEW difficulty with communication that is expected to last >3 days?: Yes (Initiates electronic notice to provider for possible SLP consult) Is the patient deaf or have difficulty hearing?: No Does the patient have difficulty seeing, even when wearing glasses/contacts?: No Does the patient have difficulty concentrating, remembering, or making decisions?: Yes  Permission Sought/Granted         Permission granted to share info w AGENCY: [facility]        Emotional Assessment   Attitude/Demeanor/Rapport: Intubated (Following Commands or Not Following Commands) Affect (typically observed): Unable to Assess Orientation: : Oriented to Self Alcohol / Substance Use: Not Applicable Psych Involvement: No (comment)  Admission diagnosis:  Status epilepticus (HCC) [G40.901] Patient Active Problem List   Diagnosis Date Noted   Status epilepticus (HCC) 03/06/2024   Protein-calorie malnutrition, severe 10/05/2023   Acute metabolic encephalopathy 10/02/2023   Closed displaced intertrochanteric fracture of right femur (HCC) 07/10/2023   Fall 07/10/2023   Depression 07/10/2023   Hyperlipidemia 07/10/2023   Anemia of chronic disease 07/10/2023   History of COVID-19 09/14/2020   Abnormal findings on diagnostic imaging of lung 09/14/2020   Hypoxemia 07/23/2020   Respiratory failure (HCC) 06/15/2019   Acute respiratory failure with hypoxia (HCC) 06/14/2019   COPD (chronic obstructive pulmonary disease) (HCC) 06/14/2019   COPD with acute bronchitis (HCC) 06/14/2019   Hypokalemia 06/14/2019   Essential hypertension 06/14/2019   PCP:  Roselind Congo, MD Pharmacy:   [facility] - [place], [place] - [phone] [place] [address] Phone: [phone] Fax: [phone]     Social Drivers of Health (SDOH) Social History: SDOH Screenings   Food Insecurity: Patient Unable To Answer (03/07/2024)  Housing: Patient Unable To  Answer (03/07/2024)  Transportation Needs: Patient Unable To Answer (03/07/2024)  Utilities: Patient Unable To Answer (03/07/2024)  Social Connections: Patient Unable To Answer (03/07/2024)  Tobacco Use: Medium Risk (03/06/2024)   SDOH Interventions:     Readmission Risk Interventions     No data to display

## 2024-03-14 NOTE — Plan of Care (Signed)
  Problem: Clinical Measurements: Goal: Will remain free from infection Outcome: Progressing Goal: Diagnostic test results will improve Outcome: Progressing Goal: Respiratory complications will improve Outcome: Progressing   Problem: Nutrition: Goal: Adequate nutrition will be maintained Outcome: Progressing   Problem: Elimination: Goal: Will not experience complications related to bowel motility Outcome: Progressing Goal: Will not experience complications related to urinary retention Outcome: Progressing   Problem: Pain Managment: Goal: General experience of comfort will improve and/or be controlled Outcome: Progressing   Problem: Safety: Goal: Ability to remain free from injury will improve Outcome: Progressing

## 2024-03-15 DIAGNOSIS — G40901 Epilepsy, unspecified, not intractable, with status epilepticus: Secondary | ICD-10-CM | POA: Diagnosis not present

## 2024-03-15 LAB — GLUCOSE, CAPILLARY
Glucose-Capillary: 112 mg/dL — ABNORMAL HIGH (ref 70–99)
Glucose-Capillary: 112 mg/dL — ABNORMAL HIGH (ref 70–99)
Glucose-Capillary: 100 mg/dL — ABNORMAL HIGH (ref 70–99)
Glucose-Capillary: 108 mg/dL — ABNORMAL HIGH (ref 70–99)
Glucose-Capillary: 117 mg/dL — ABNORMAL HIGH (ref 70–99)

## 2024-03-15 NOTE — Plan of Care (Signed)
 A/o x1, vs stable. Unable to follow commands. Incomprehensive speech. [place] 2L, sacral pressure ulcer in various stages of healing. Incontinent (Flexiseal, purewick).Cortrak with Tube feeding. NPO. MRSA positive in nares. Bed in low position, alarms are on, call bell in reach. Turns and mouth care q 2 hrs or more often.

## 2024-03-15 NOTE — Progress Notes (Signed)
 Speech Language Pathology Treatment: Dysphagia  Patient Details Name: Chloe Wilcox MRN: [id number] DOB: 07/26/1935 Today's Date: 03/15/2024 Time: 8657-8469 SLP Time Calculation (min) (ACUTE ONLY): 15 min  Assessment / Plan / Recommendation Clinical Impression  Per RN, pt remains intermittently lethargic and agitated. She took very few spoon, cup, and siphoned straw sips of water . Pt did not initiate sips via straw and did not close her lips around the spoon or straw. She was unable to achieve a labial seal with POs in her oral cavity, resulting in gross anterior spillage. No signs concerning for aspiration were observed, although note frequent belching which seems consistent with previous SLP visits. Pt is known to this SLP from 09/2023 admission and her mentation appears different from then, as she was accepting of POs and on a pureed diet. Given current mentation, recommend she remain NPO with AMN in place for med administration. She can have ice chips and sips of water  after oral care. SLP will continue following.    HPI HPI: Patient is an 89 y.o. female with PMH: recent diagnosis of seizures (November 2024), dementia, FTT, HTN, COPD, depression, dysphagia. She presented to the hospital on 03/07/23 from her SNF secondary to reduced responsiveness, twitching, right sided gaze. EMS called and she received IM Versed  without improvement and seizures resolved approximately after approximately 45 minutes. In ED, she required intubation for airway protection. CT head was negative for bleed. MRI brain showed Chronic cortical/subcortical left MCA territory infarct again demonstrated within the mid and posterior left frontal lobe, left  insula and left parietal lobe  She was extubated 03/10/24. She was kept NPO awaiting SLP evaluation of swallow.      SLP Plan  Continue with current plan of care      Recommendations for follow up therapy are one component of a multi-disciplinary discharge planning process,  led by the attending physician.  Recommendations may be updated based on patient status, additional functional criteria and insurance authorization.    Recommendations  Diet recommendations: NPO Medication Administration: Via alternative means                  Oral care QID;Staff/trained caregiver to provide oral care   Frequent or constant Supervision/Assistance Dysphagia, unspecified (R13.10)     Continue with current plan of care     Amil Kale, M.A., CCC-SLP Speech Language Pathology, Acute Rehabilitation Services  Secure Chat preferred [phone]   03/15/2024, 10:34 AM

## 2024-03-15 NOTE — Progress Notes (Signed)
  Progress Note   Patient: Chloe Wilcox [id number]:[id number] DOB: [birth year] DOA: 03/06/2024     9 DOS: the patient was seen and examined on 03/15/2024   Brief hospital course: 88yo with hx seizures, cognitive deficits, failure to thrive who presented from LTC with recurrent seizures requiring intubation for airway protection. Neurology was consulted. Seizures were ultimately controlled and pt extubated. Care transferred to [facility] 4/17  Assessment and Plan: Acute toxic metabolic encephalopathy  -Pt conversant this afternoon when seen  Seizures with status epilepticus -Neurology had been following -recs to continue phyenytoin 100mg  qhs, 75mg  qam -cont Vimpat  100mg  bid, gabapentin  100mg  TID, Keppra  750mg  bid -Neurology to f/u in 2-3 mos, Neurology has since signed off -Seems stable this afternoon  Acute on Chronic respiratory failure with hypoxia and hypercarbia with COPD -Does not appear sob -On room air  PNA secondary to MSSA and H.flu  -On rocephin  through 4/18 -seems stable at this time -no leukocytosis, afebrile  HTN -BP stable and controlled -cont norvasc , avapro   HLD -cont statin  Severe protein calorie malnutrition -currently remains on tube feed -SLP and dietitian following, recs for continued NPO with alternative means of nutrition     Subjective: difficult to assess given mentation. Seems pleasant and comfortable  Physical Exam: Vitals:   03/15/24 0727 03/15/24 0823 03/15/24 1212 03/15/24 1515  BP: 132/61  (!) 119/50 135/66  Pulse: 83  80   Resp: 20  19 14   Temp: 98.1 F (36.7 C)  98.4 F (36.9 C) 98 F (36.7 C)  TempSrc: Oral  Oral Oral  SpO2: 100% 100% 96% 100%  Weight:      Height:       General exam: Conversant, in no acute distress Respiratory system: normal chest rise, clear, no audible wheezing Cardiovascular system: regular rhythm, s1-s2 Gastrointestinal system: Nondistended, nontender, pos BS Central nervous system: No seizures, no  tremors Extremities: No cyanosis, no joint deformities Skin: No rashes, no pallor Psychiatry: Affect normal // no auditory hallucinations   Data Reviewed:  There are no new results to review at this time.  Family Communication: Pt in room, family not at bedside  Disposition: Status is: Inpatient Remains inpatient appropriate because: severity of illness  Planned Discharge Destination: Skilled nursing facility    Author: Garnette Pelt, MD 03/15/2024 4:18 PM  For on call review [email or web address].

## 2024-03-15 NOTE — Plan of Care (Signed)

## 2024-03-16 DIAGNOSIS — G40901 Epilepsy, unspecified, not intractable, with status epilepticus: Secondary | ICD-10-CM | POA: Diagnosis not present

## 2024-03-16 LAB — COMPREHENSIVE METABOLIC PANEL WITH GFR
ALT: 17 U/L (ref 0–44)
AST: 19 U/L (ref 15–41)
Albumin: 3 g/dL — ABNORMAL LOW (ref 3.5–5.0)
Alkaline Phosphatase: 55 U/L (ref 38–126)
Anion gap: 11 (ref 5–15)
BUN: 20 mg/dL (ref 8–23)
CO2: 32 mmol/L (ref 22–32)
Calcium: 9.7 mg/dL (ref 8.9–10.3)
Chloride: 94 mmol/L — ABNORMAL LOW (ref 98–111)
Creatinine, Ser: 0.32 mg/dL — ABNORMAL LOW (ref 0.44–1.00)
GFR, Estimated: >60 mL/min (ref >60)
Glucose, Bld: 121 mg/dL — ABNORMAL HIGH (ref 70–99)
Potassium: 4.4 mmol/L (ref 3.5–5.1)
Sodium: 137 mmol/L (ref 135–145)
Total Bilirubin: 0.2 mg/dL (ref 0.0–1.2)
Total Protein: 6 g/dL — ABNORMAL LOW (ref 6.5–8.1)

## 2024-03-16 LAB — CBC
HCT: 32 % — ABNORMAL LOW (ref 36.0–46.0)
Hemoglobin: 10.1 g/dL — ABNORMAL LOW (ref 12.0–15.0)
MCH: 29.2 pg (ref 26.0–34.0)
MCHC: 31.6 g/dL (ref 30.0–36.0)
MCV: 92.5 fL (ref 80.0–100.0)
Platelets: 337 10*3/uL (ref 150–400)
RBC: 3.46 MIL/uL — ABNORMAL LOW (ref 3.87–5.11)
RDW: 12.7 % (ref 11.5–15.5)
WBC: 8.2 10*3/uL (ref 4.0–10.5)
nRBC: 0 % (ref 0.0–0.2)

## 2024-03-16 LAB — GLUCOSE, CAPILLARY
Glucose-Capillary: 107 mg/dL — ABNORMAL HIGH (ref 70–99)
Glucose-Capillary: 115 mg/dL — ABNORMAL HIGH (ref 70–99)
Glucose-Capillary: 120 mg/dL — ABNORMAL HIGH (ref 70–99)
Glucose-Capillary: 121 mg/dL — ABNORMAL HIGH (ref 70–99)
Glucose-Capillary: 128 mg/dL — ABNORMAL HIGH (ref 70–99)
Glucose-Capillary: 134 mg/dL — ABNORMAL HIGH (ref 70–99)
Glucose-Capillary: 134 mg/dL — ABNORMAL HIGH (ref 70–99)

## 2024-03-16 MED ORDER — ORAL CARE MOUTH RINSE
15.0000 mL | Freq: Three times a day (TID) | OROMUCOSAL | Status: DC
  Administered 2024-03-16 – 2024-03-20 (×11): 15 mL via OROMUCOSAL

## 2024-03-16 MED ORDER — QUETIAPINE FUMARATE 50 MG PO TABS
25.0000 mg | ORAL_TABLET | Freq: Every day | ORAL | Status: DC
  Administered 2024-03-16 – 2024-03-20 (×5): 25 mg
  Filled 2024-03-16 (×5): qty 1

## 2024-03-16 MED ORDER — HALOPERIDOL LACTATE 5 MG/ML IJ SOLN
2.0000 mg | Freq: Four times a day (QID) | INTRAMUSCULAR | Status: DC | PRN
  Administered 2024-03-16 – 2024-04-01 (×7): 2 mg via INTRAVENOUS
  Filled 2024-03-16 (×8): qty 1

## 2024-03-16 NOTE — Progress Notes (Signed)
  Progress Note   Patient: Chloe Wilcox [id number] DOB: 11/10/1935 DOA: 03/06/2024     87 DOS: the patient was seen and examined on 03/16/2024   Brief hospital course: 89yo with hx seizures, cognitive deficits, failure to thrive who presented from LTC with recurrent seizures requiring intubation for airway protection. Neurology was consulted. Seizures were ultimately controlled and pt extubated. Care transferred to [facility] 4/17  Assessment and Plan: Acute toxic metabolic encephalopathy  -confused this AM and agitated -Staff reports that pt has been up all night and has not been sleeping. Also likely underlying ICU delerium as well -EKG reviewed. QTc unremarkable -Will order PRN haldol  for agitation and start low dose seroquel  per tube to promote proper day/night cycle  Seizures with status epilepticus -Neurology had been following -recs to continue phyenytoin 100mg  qhs, 75mg  qam -cont Vimpat  100mg  bid, gabapentin  100mg  TID, Keppra  750mg  bid -Neurology to f/u in 2-3 mos, Neurology has since signed off -remains stable  Acute on Chronic respiratory failure with hypoxia and hypercarbia with COPD -Does not appear sob -On room air  PNA secondary to MSSA and H.flu  -On rocephin  through 4/18 -seems stable at this time -no leukocytosis, afebrile  HTN -BP stable and controlled -cont norvasc , avapro   HLD -cont statin  Severe protein calorie malnutrition -currently remains on tube feed -SLP and dietitian following, recs for continued NPO with alternative means of nutrition     Subjective: Pleasantly confused when seen. Earlier in day, pt was noted to be very agitated  Physical Exam: Vitals:   03/16/24 0900 03/16/24 1200 03/16/24 1430 03/16/24 1525  BP:  (!) 114/54 (!) 127/54 (!) 130/56  Pulse: 82 76 97 93  Resp: (!) 22 19 20  (!) 24  Temp:   98.4 F (36.9 C) 99.1 F (37.3 C)  TempSrc:   Oral Oral  SpO2: 100% 100% 95% 95%  Weight:      Height:       General exam: Awake,  laying in bed, in nad Respiratory system: Normal respiratory effort, no wheezing Cardiovascular system: regular rate, s1, s2 Gastrointestinal system: Soft, nondistended, positive BS Central nervous system: CN2-12 grossly intact, strength intact Extremities: Perfused, no clubbing Skin: Normal skin turgor, no notable skin lesions seen Psychiatry: Mood normal // no visual hallucinations   Data Reviewed:  Labs reviewed: na 137, K 4.4, Cr 0.32, WBC 8.2, hgb 10.1, Plts 337  Family Communication: Pt in room, family not at bedside  Disposition: Status is: Inpatient Remains inpatient appropriate because: severity of illness  Planned Discharge Destination: Skilled nursing facility    Author: Cherylle Corwin, MD 03/16/2024 4:50 PM  For on call review [email or web address].

## 2024-03-17 DIAGNOSIS — G40901 Epilepsy, unspecified, not intractable, with status epilepticus: Secondary | ICD-10-CM | POA: Diagnosis not present

## 2024-03-17 LAB — GLUCOSE, CAPILLARY
Glucose-Capillary: 125 mg/dL — ABNORMAL HIGH (ref 70–99)
Glucose-Capillary: 100 mg/dL — ABNORMAL HIGH (ref 70–99)
Glucose-Capillary: 104 mg/dL — ABNORMAL HIGH (ref 70–99)
Glucose-Capillary: 121 mg/dL — ABNORMAL HIGH (ref 70–99)
Glucose-Capillary: 111 mg/dL — ABNORMAL HIGH (ref 70–99)

## 2024-03-17 NOTE — Consult Note (Signed)
 WOC Nurse Consult Note: Reason for Consult: pressure injury Wound type:Deep Tissue Injury Pressure Injury Pressure Injury POA: Yes Measurement: see nursing note Wound bed: darkened purple tissue Drainage (amount, consistency, odor) see nursing notes Periwound: intact  Dressing procedure/placement/frequency: Cleanse area with saline, pat dry Cover with single layer of xeroform, top with foam. Turn and reposition per hospital policy Low air loss mattress for moisture management and pressure redistribution  Re consult if needed, will not follow at this time. Thanks  Alis Sawchuk [employer], RN,CWOCN, CNS, CWON-AP [phone])

## 2024-03-17 NOTE — Progress Notes (Signed)
  Progress Note   Patient: Chloe Wilcox [id number]:[id number] DOB: [birth year] DOA: 03/06/2024     11 DOS: the patient was seen and examined on 03/17/2024   Brief hospital course: 88yo with hx seizures, cognitive deficits, failure to thrive who presented from LTC with recurrent seizures requiring intubation for airway protection. Neurology was consulted. Seizures were ultimately controlled and pt extubated. Care transferred to [facility] 4/17  Assessment and Plan: Acute toxic metabolic encephalopathy  -confused this AM and agitated -Staff reports that pt had recently been up all night and has not been sleeping. Also likely underlying ICU delerium as well -EKG reviewed. QTc unremarkable -Now sleeping better with trial of 25mg  seroquel . Titrate as needed -Cont PRN haldol  as needed  Seizures with status epilepticus -Neurology had been following -recs to continue phyenytoin 100mg  qhs, 75mg  qam -cont Vimpat  100mg  bid, gabapentin  100mg  TID, Keppra  750mg  bid -Neurology to f/u in 2-3 mos, Neurology has since signed off -remains stable  Acute on Chronic respiratory failure with hypoxia and hypercarbia with COPD -Does not appear sob -On room air  PNA secondary to MSSA and H.flu  -On rocephin  through 4/18 -seems stable at this time -no leukocytosis, afebrile  HTN -BP stable and controlled -cont norvasc , avapro   HLD -cont statin  Severe protein calorie malnutrition -currently remains on tube feed -SLP and dietitian continues to follow, recs for continued NPO with alternative means of nutrition     Subjective: Pleasant this AM. Oriented to place and person  Physical Exam: Vitals:   03/16/24 2305 03/17/24 0318 03/17/24 0800 03/17/24 1115  BP: 109/60 (!) 110/55 (!) 127/52 94/77  Pulse: 85 75 77 85  Resp: 17 17 16 20   Temp: 98.6 F (37 C) 98.3 F (36.8 C) 98 F (36.7 C)   TempSrc: Axillary Oral Oral Oral  SpO2: 100% 100% 100% 100%  Weight:      Height:       General exam: Conversant, in  no acute distress Respiratory system: normal chest rise, clear, no audible wheezing Cardiovascular system: regular rhythm, s1-s2 Gastrointestinal system: Nondistended, nontender, pos BS Central nervous system: No seizures, no tremors Extremities: No cyanosis, no joint deformities Skin: No rashes, no pallor Psychiatry: Affect normal // no auditory hallucinations   Data Reviewed:  There are no new results to review at this time.  Family Communication: Pt in room, family not at bedside  Disposition: Status is: Inpatient Remains inpatient appropriate because: severity of illness  Planned Discharge Destination: Skilled nursing facility    Author: Cherylle Corwin, MD 03/17/2024 1:00 PM  For on call review [email or web address].

## 2024-03-18 DIAGNOSIS — G40901 Epilepsy, unspecified, not intractable, with status epilepticus: Secondary | ICD-10-CM | POA: Diagnosis not present

## 2024-03-18 LAB — GLUCOSE, CAPILLARY
Glucose-Capillary: 108 mg/dL — ABNORMAL HIGH (ref 70–99)
Glucose-Capillary: 118 mg/dL — ABNORMAL HIGH (ref 70–99)
Glucose-Capillary: 123 mg/dL — ABNORMAL HIGH (ref 70–99)
Glucose-Capillary: 128 mg/dL — ABNORMAL HIGH (ref 70–99)
Glucose-Capillary: 86 mg/dL (ref 70–99)
Glucose-Capillary: 97 mg/dL (ref 70–99)

## 2024-03-18 NOTE — Progress Notes (Signed)
 Speech Language Pathology Treatment: Dysphagia  Patient Details Name: Chloe Wilcox MRN: [id number] DOB: 09-01-1935 Today's Date: 03/18/2024 Time: 4098-1191 SLP Time Calculation (min) (ACUTE ONLY): 9 min  Assessment / Plan / Recommendation Clinical Impression  Pt much more alert and seemingly eager for POs. She continues to have frequent eructation following all bites/sips. Thin liquids were observed without signs clinically concerning for aspiration. She achieved prompt oral clearance with purees, but had significantly prolonged mastication with simulated Dys 2 textures. Initiate diet of Dys 1 solids with thin liquids. Her dysphagia is suspected to be cognitive in nature and she will require full supervision and assistance with feeding. SLP will continue following to assess ability to advance.    HPI HPI: Patient is an 89 y.o. female with PMH: recent diagnosis of seizures (November 2024), dementia, FTT, HTN, COPD, depression, dysphagia. She presented to the hospital on 03/07/23 from her SNF secondary to reduced responsiveness, twitching, right sided gaze. EMS called and she received IM Versed  without improvement and seizures resolved approximately after approximately 45 minutes. In ED, she required intubation for airway protection. CT head was negative for bleed. MRI brain showed Chronic cortical/subcortical left MCA territory infarct again demonstrated within the mid and posterior left frontal lobe, left  insula and left parietal lobe  She was extubated 03/10/24. She was kept NPO awaiting SLP evaluation of swallow.      SLP Plan  Continue with current plan of care      Recommendations for follow up therapy are one component of a multi-disciplinary discharge planning process, led by the attending physician.  Recommendations may be updated based on patient status, additional functional criteria and insurance authorization.    Recommendations  Diet recommendations: Dysphagia 1 (puree);Thin  liquid Liquids provided via: Cup;Straw Medication Administration: Crushed with puree Supervision: Full supervision/cueing for compensatory strategies;Trained caregiver to feed patient;Staff to assist with self feeding Compensations: Slow rate;Small sips/bites Postural Changes and/or Swallow Maneuvers: Seated upright 90 degrees;Upright 30-60 min after meal                  Oral care BID   Frequent or constant Supervision/Assistance Dysphagia, unspecified (R13.10)     Continue with current plan of care     Amil Kale, M.A., CCC-SLP Speech Language Pathology, Acute Rehabilitation Services  Secure Chat preferred [phone]   03/18/2024, 4:16 PM

## 2024-03-18 NOTE — Progress Notes (Signed)
  Progress Note   Patient: Chloe Wilcox ZOX:[id number] DOB: [birth year] DOA: 03/06/2024     12 DOS: the patient was seen and examined on 03/18/2024   Brief hospital course: 89yo with hx seizures, cognitive deficits, failure to thrive who presented from LTC with recurrent seizures requiring intubation for airway protection. Neurology was consulted. Seizures were ultimately controlled and pt extubated. Care transferred to [facility] 4/17  Assessment and Plan: Acute toxic metabolic encephalopathy  -confused this AM and agitated -Staff reports that pt had recently been up all night and has not been sleeping. Also likely underlying ICU delerium as well -EKG reviewed. QTc unremarkable -Sleeping much better now with trial of 25mg  seroquel . -Pleasant and conversant this AM -Cont PRN haldol  as needed  Seizures with status epilepticus -Neurology had been following -recs to continue phyenytoin 100mg  qhs, 75mg  qam -cont Vimpat  100mg  bid, gabapentin  100mg  TID, Keppra  750mg  bid -Neurology to f/u in 2-3 mos, Neurology has since signed off -remains stable  Acute on Chronic respiratory failure with hypoxia and hypercarbia with COPD -Does not appear sob -On room air  PNA secondary to MSSA and H.flu  -On rocephin  through 4/18 -seems stable at this time -no leukocytosis, afebrile  HTN -BP stable and controlled -cont norvasc , avapro   HLD -cont statin  Severe protein calorie malnutrition -currently remains on tube feed -SLP and dietitian following. Since pt is no longer agitated, hopeful that pt can pass swallow eval and therefore d/c NG     Subjective: Conversant and pleasant. Feels hungry  Physical Exam: Vitals:   03/17/24 2314 03/18/24 0344 03/18/24 0500 03/18/24 0800  BP: (!) 101/47 121/60  (!) 122/52  Pulse:    85  Resp:    20  Temp: 97.8 F (36.6 C) 98.1 F (36.7 C)  98.1 F (36.7 C)  TempSrc: Axillary Axillary  Axillary  SpO2:    96%  Weight:   40.9 kg   Height:       General  exam: Awake, laying in bed, in nad Respiratory system: Normal respiratory effort, no wheezing Cardiovascular system: regular rate, s1, s2 Gastrointestinal system: Soft, nondistended, positive BS Central nervous system: CN2-12 grossly intact, strength intact Extremities: Perfused, no clubbing Skin: Normal skin turgor, no notable skin lesions seen Psychiatry: Mood normal // no visual hallucinations   Data Reviewed:  There are no new results to review at this time.  Family Communication: Pt in room, family not at bedside  Disposition: Status is: Inpatient Remains inpatient appropriate because: severity of illness  Planned Discharge Destination: Skilled nursing facility    Author: Cherylle Corwin, MD 03/18/2024 3:17 PM  For on call review [email or web address].

## 2024-03-18 NOTE — Progress Notes (Signed)
 Nutrition Follow-up  DOCUMENTATION CODES:   Underweight, Severe malnutrition in context of social or environmental circumstances  INTERVENTION:   -D/c Ensure Enlive -Continue TF via cortrak:   Osmolite 1.2 @ 50 ml/hr   Tube feeding regimen provides 1440 kcal (100% of needs), 67 grams of protein, and 984 ml of H2O.   -Continue 1 packet Juven BID, each packet provides 95 calories, 2.5 grams of protein (collagen), and 9.8 grams of carbohydrate (3 grams sugar); also contains 7 grams of L-arginine and L-glutamine, 300 mg vitamin C , 15 mg vitamin E, 1.2 mcg vitamin B-12, 9.5 mg zinc, 200 mg calcium , and 1.5 g  Calcium  Beta-hydroxy-Beta-methylbutyrate to support wound healing  -Continue MVI with minerals daily -RD will follow for diet advancement and adjust TF regimen as appropriate   NUTRITION DIAGNOSIS:   Severe Malnutrition related to social / environmental circumstances (communal living, poor functional status) as evidenced by severe fat depletion, severe muscle depletion.  Ongoing  GOAL:   Patient will meet greater than or equal to 90% of their needs  Met with TF  MONITOR:   TF tolerance, Diet advancement  REASON FOR ASSESSMENT:   Ventilator, Consult Enteral/tube feeding initiation and management  ASSESSMENT:   Pt with hx of HTN, HLD, and COPD on home O2 presented to ED from her facility with seizure-like activity.  4/9 - presented to ED from facility, intubated  4/10 - Tube feeds started via OGT 4/13 - Extubated, tube feeds stopped  4/14 - FLD, Cortrak placed (gastric) and tube feeds restarted  4/15- s/p BSE- NPO  Reviewed I/O's: +1.9 L x 24 hours and +5.1 L since admission  UOP: 500 ml x 24 hours  Rectal tube output: 500 ml x 24 hours  Pt unavailable at time of visit. Attempted to speak with pt via call to hospital room phone, however, unable to reach. RD unable to obtain further nutrition-related history or complete nutrition-focused physical exam at this time.     Case discussed with RN, who reports pt continue to tolerate TF well. Goal for today is for SLP evaluation for possible diet advancement.   Pt remains NPO and receiving TF via cortrak tube for sole source nutrition: Osmolite 1.2 @ 50 ml/hr.    Palliative care following for goals of care discussions.   Per TOC notes, plan for SNF placement at discharge.   Medications reviewed and include celexa , neurontin , vimpat , keppra , and dilantin .   Labs reviewed: CBGS: 97-134 (inpatient orders for glycemic control are none).    Diet Order:   Diet Order             Diet NPO time specified Except for: Other (See Comments)  Diet effective now                   EDUCATION NEEDS:   Not appropriate for education at this time  Skin:  Skin Assessment: Skin Integrity Issues: Skin Integrity Issues:: Stage II Stage II: Coyccx  Last BM:  03/18/24 (type 7- via rectal tube)  Height:   Ht Readings from Last 1 Encounters:  03/07/24 5' (1.524 m)    Weight:   Wt Readings from Last 1 Encounters:  03/18/24 40.9 kg    Ideal Body Weight:  45.5 kg  BMI:  Body mass index is 17.61 kg/m.  Estimated Nutritional Needs:   Kcal:  1400-1600 kcal  Protein:  70-90 gm  Fluid:  >1.4L/day    Herschel Lords, RD, LDN, CDCES Registered Dietitian III Certified Diabetes Care and  Education Specialist If unable to reach this RD, please use "RD Inpatient" group chat on secure chat between hours of 8am-4 pm daily

## 2024-03-18 NOTE — Plan of Care (Signed)

## 2024-03-18 NOTE — Plan of Care (Signed)
  Problem: Clinical Measurements: Goal: Ability to maintain clinical measurements within normal limits will improve Outcome: Progressing Goal: Will remain free from infection Outcome: Progressing Goal: Diagnostic test results will improve Outcome: Progressing Goal: Respiratory complications will improve Outcome: Progressing Goal: Cardiovascular complication will be avoided Outcome: Progressing   Problem: Activity: Goal: Risk for activity intolerance will decrease Outcome: Progressing   Problem: Nutrition: Goal: Adequate nutrition will be maintained Outcome: Progressing   Problem: Coping: Goal: Level of anxiety will decrease Outcome: Progressing   Problem: Elimination: Goal: Will not experience complications related to bowel motility Outcome: Progressing Goal: Will not experience complications related to urinary retention Outcome: Progressing   Problem: Pain Managment: Goal: General experience of comfort will improve and/or be controlled Outcome: Progressing   Problem: Safety: Goal: Ability to remain free from injury will improve Outcome: Progressing   Problem: Skin Integrity: Goal: Risk for impaired skin integrity will decrease Outcome: Progressing   Problem: Education: Goal: Expressions of having a comfortable level of knowledge regarding the disease process will increase Outcome: Progressing   Problem: Coping: Goal: Ability to adjust to condition or change in health will improve Outcome: Progressing Goal: Ability to identify appropriate support needs will improve Outcome: Progressing   Problem: Health Behavior/Discharge Planning: Goal: Compliance with prescribed medication regimen will improve Outcome: Progressing   Problem: Medication: Goal: Risk for medication side effects will decrease Outcome: Progressing   Problem: Clinical Measurements: Goal: Complications related to the disease process, condition or treatment will be avoided or minimized Outcome:  Progressing Goal: Diagnostic test results will improve Outcome: Progressing   Problem: Self-Concept: Goal: Level of anxiety will decrease Outcome: Progressing

## 2024-03-19 DIAGNOSIS — G40901 Epilepsy, unspecified, not intractable, with status epilepticus: Secondary | ICD-10-CM | POA: Diagnosis not present

## 2024-03-19 LAB — GLUCOSE, CAPILLARY
Glucose-Capillary: 133 mg/dL — ABNORMAL HIGH (ref 70–99)
Glucose-Capillary: 96 mg/dL (ref 70–99)
Glucose-Capillary: 129 mg/dL — ABNORMAL HIGH (ref 70–99)
Glucose-Capillary: 112 mg/dL — ABNORMAL HIGH (ref 70–99)
Glucose-Capillary: 101 mg/dL — ABNORMAL HIGH (ref 70–99)
Glucose-Capillary: 118 mg/dL — ABNORMAL HIGH (ref 70–99)

## 2024-03-19 MED ORDER — ENSURE ENLIVE PO LIQD
237.0000 mL | Freq: Two times a day (BID) | ORAL | Status: DC
  Administered 2024-03-19 – 2024-03-22 (×6): 237 mL via ORAL

## 2024-03-19 NOTE — TOC Progression Note (Signed)
 Transition of Care [facility]) - Progression Note    Patient Details  Name: Chloe Wilcox MRN: [id number] Date of Birth: 09-24-1935  Transition of Care [facility]) CM/SW Contact  Valery Gaucher, [place] Phone Number: 03/19/2024, 10:24 AM  Clinical Narrative:     TOC continues to follow   Liddie Reel, MSW, LCSW Clinical Social Worker    Expected Discharge Plan: Skilled Nursing Facility Barriers to Discharge: Continued Medical Work up  Expected Discharge Plan and Services In-house Referral: Clinical Social Work   Post Acute Care Choice: Skilled Nursing Facility Living arrangements for the past 2 months: Skilled Nursing Facility                                       Social Determinants of Health (SDOH) Interventions SDOH Screenings   Food Insecurity: Patient Unable To Answer (03/07/2024)  Housing: Patient Unable To Answer (03/07/2024)  Transportation Needs: Patient Unable To Answer (03/07/2024)  Utilities: Patient Unable To Answer (03/07/2024)  Social Connections: Patient Unable To Answer (03/07/2024)  Tobacco Use: Medium Risk (03/06/2024)    Readmission Risk Interventions     No data to display

## 2024-03-19 NOTE — Progress Notes (Signed)
  Progress Note   Patient: Chloe Wilcox WUJ:[id number] DOB: 03-15-1935 DOA: 03/06/2024     13 DOS: the patient was seen and examined on 03/19/2024   Brief hospital course: 89yo with hx seizures, cognitive deficits, failure to thrive who presented from LTC with recurrent seizures requiring intubation for airway protection. Neurology was consulted. Seizures were ultimately controlled and pt extubated. Care transferred to [facility] 4/17  Assessment and Plan: Acute toxic metabolic encephalopathy  -confused this AM and agitated -Staff reports that pt had recently been up all night and has not been sleeping. Also likely underlying ICU delerium as well -EKG reviewed. QTc unremarkable -Sleeping much better now with trial of 25mg  seroquel . - conversant and interactive this afternoon. Improved  Seizures with status epilepticus -Neurology had been following -recs to continue phyenytoin 100mg  qhs, 75mg  qam -cont Vimpat  100mg  bid, gabapentin  100mg  TID, Keppra  750mg  bid -Neurology to f/u in 2-3 mos, Neurology has since signed off -remains stable  Acute on Chronic respiratory failure with hypoxia and hypercarbia with COPD -Does not appear sob -remains on room air  PNA secondary to MSSA and H.flu  -On rocephin  through 4/18 -seems stable at this time -no leukocytosis, afebrile  HTN -BP stable and controlled -cont norvasc , avapro   HLD -cont statin  Severe protein calorie malnutrition -currently remains on supplemental tube feed per dietitian -SLP and dietitian following. Pt has been clear for dysphagia 1 diet. Continue to encourage PO as tolerated. May need assistance feeding     Subjective: Conversant and pleasant. Feels hungry  Physical Exam: Vitals:   03/19/24 0741 03/19/24 1200 03/19/24 1350 03/19/24 1524  BP: (!) 125/49 (!) 118/43 (!) 115/46 (!) 127/49  Pulse: 86 83 82 88  Resp: 20 (!) 25 20 (!) 24  Temp: 98.6 F (37 C) 98 F (36.7 C)  99.9 F (37.7 C)  TempSrc: Oral Oral  Axillary   SpO2: 97% 100% 98% 99%  Weight:      Height:       General exam: Conversant, in no acute distress Respiratory system: normal chest rise, clear, no audible wheezing Cardiovascular system: regular rhythm, s1-s2 Gastrointestinal system: Nondistended, nontender, pos BS Central nervous system: No seizures, no tremors Extremities: No cyanosis, no joint deformities Skin: No rashes, no pallor Psychiatry: Affect normal // no auditory hallucinations   Data Reviewed:  There are no new results to review at this time.  Family Communication: Pt in room, family not at bedside  Disposition: Status is: Inpatient Remains inpatient appropriate because: severity of illness  Planned Discharge Destination: Skilled nursing facility    Author: Cherylle Corwin, MD 03/19/2024 4:36 PM  For on call review [email or web address].

## 2024-03-19 NOTE — Progress Notes (Signed)
 Nutrition Follow-up  DOCUMENTATION CODES:   Underweight, Severe malnutrition in context of social or environmental circumstances  INTERVENTION:  Tube Feeds via Cortrak: Osmolite 1.2 at 50 ml/h (1200 ml per day)  Provides 1440 kcal, 67 gm protein, 984 ml free water  daily  Recommend continuing enteral nutrition until pt is able to consistently meet >60% of estimated nutritional needs via PO. Continue Multivitamin w/ minerals daily Ensure Enlive po BID, each supplement provides 350 kcal and 20 grams of protein. Continue 1 packet Juven BID, each packet provides 95 calories, 2.5 grams of protein to support wound healing  NUTRITION DIAGNOSIS:  Severe Malnutrition related to social / environmental circumstances (communal living, poor functional status) as evidenced by severe fat depletion, severe muscle depletion. - Ongoing   GOAL:  Patient will meet greater than or equal to 90% of their needs - Being met via TF  MONITOR:   PO intake, Supplement acceptance, Labs, I & O's, TF tolerance  REASON FOR ASSESSMENT:   Ventilator, Consult Enteral/tube feeding initiation and management  ASSESSMENT:   Pt with hx of HTN, HLD, and COPD on home O2 presented to ED from her facility with seizure-like activity.  4/09 - presented to ED from facility, intubated  4/10 - Tube feeds started via OGT 4/13 - Extubated, tube feeds stopped  4/14 - FLD, Cortrak placed (gastric) and tube feeds restarted  4/15 - s/p BSE- NPO 4/21 - diet advanced to Dysphagia 1, Thin Liquids  Pt laying in bed, TF infusing at goal rate. Pt reports that she did not eat breakfast or dinner. Spoke with staff outside of room, pt at all her breakfast plate and side pineapple, did not eat the grits.   Meal Intake 4/22: 75%  Admission Weight: 41.8 kg Current Weight: 40.5 kg  Medications: MVI Labs reviewed. CBG: 86-133 x 24 hrs   UOP: 400 mL x 24 hrs   Diet Order:   Diet Order             DIET - DYS 1 Room service  appropriate? No; Fluid consistency: Thin  Diet effective now                   EDUCATION NEEDS:   Not appropriate for education at this time  Skin:  Skin Assessment: Skin Integrity Issues: Skin Integrity Issues:: Stage II Stage II: Coyccx  Last BM:  03/18/24 (type 7- via rectal tube)  Height:  Ht Readings from Last 1 Encounters:  03/07/24 5' (1.524 m)   Weight:  Wt Readings from Last 1 Encounters:  03/19/24 40.5 kg   Ideal Body Weight:  45.5 kg  BMI:  Body mass index is 17.44 kg/m.  Estimated Nutritional Needs:  Kcal:  1400-1600 kcal Protein:  70-90 gm Fluid:  >1.4L/day   Doneta Furbish RD, LDN Clinical Dietitian

## 2024-03-19 NOTE — Plan of Care (Signed)
   Medical records reviewed including progress notes, labs, imaging. Patient's mental status improving and she has started dysphagia 1 diet.   Goals of care are clear for aggressive life-prolonging interventions and SNF placement. Outpatient palliative care was recommended and family will inform TOC if they are interested upon discharge. DNR form has been placed in her chart per PMT discussion with family.   No other palliative needs identified. PMT will sign off at this time.  Thank you for your referral and allowing PMT to assist in [name]'s care.   Gwen Edler, PA-C Palliative Medicine Team  Team Phone # [phone]   NO CHARGE

## 2024-03-19 NOTE — Plan of Care (Signed)
  Problem: Elimination: Goal: Will not experience complications related to bowel motility Outcome: Progressing Goal: Will not experience complications related to urinary retention Outcome: Progressing   Problem: Safety: Goal: Ability to remain free from injury will improve Outcome: Progressing   Problem: Education: Goal: Knowledge of General Education information will improve Description: Including pain rating scale, medication(s)/side effects and non-pharmacologic comfort measures Outcome: Not Progressing   Problem: Skin Integrity: Goal: Risk for impaired skin integrity will decrease Outcome: Not Progressing   Problem: Safety: Goal: Verbalization of understanding the information provided will improve Outcome: Not Progressing

## 2024-03-20 ENCOUNTER — Inpatient Hospital Stay (HOSPITAL_COMMUNITY)

## 2024-03-20 DIAGNOSIS — G40901 Epilepsy, unspecified, not intractable, with status epilepticus: Secondary | ICD-10-CM | POA: Diagnosis not present

## 2024-03-20 LAB — GLUCOSE, CAPILLARY
Glucose-Capillary: 122 mg/dL — ABNORMAL HIGH (ref 70–99)
Glucose-Capillary: 124 mg/dL — ABNORMAL HIGH (ref 70–99)
Glucose-Capillary: 131 mg/dL — ABNORMAL HIGH (ref 70–99)
Glucose-Capillary: 137 mg/dL — ABNORMAL HIGH (ref 70–99)
Glucose-Capillary: 150 mg/dL — ABNORMAL HIGH (ref 70–99)
Glucose-Capillary: 153 mg/dL — ABNORMAL HIGH (ref 70–99)

## 2024-03-20 LAB — COMPREHENSIVE METABOLIC PANEL WITH GFR
ALT: 24 U/L (ref 0–44)
AST: 23 U/L (ref 15–41)
Albumin: 3.1 g/dL — ABNORMAL LOW (ref 3.5–5.0)
Alkaline Phosphatase: 56 U/L (ref 38–126)
Anion gap: 10 (ref 5–15)
BUN: 41 mg/dL — ABNORMAL HIGH (ref 8–23)
CO2: 30 mmol/L (ref 22–32)
Calcium: 10 mg/dL (ref 8.9–10.3)
Chloride: 99 mmol/L (ref 98–111)
Creatinine, Ser: 0.49 mg/dL (ref 0.44–1.00)
GFR, Estimated: >60 mL/min (ref >60)
Glucose, Bld: 124 mg/dL — ABNORMAL HIGH (ref 70–99)
Potassium: 4.4 mmol/L (ref 3.5–5.1)
Sodium: 139 mmol/L (ref 135–145)
Total Bilirubin: 0.4 mg/dL (ref 0.0–1.2)
Total Protein: 6.3 g/dL — ABNORMAL LOW (ref 6.5–8.1)

## 2024-03-20 LAB — CBC
HCT: 30.9 % — ABNORMAL LOW (ref 36.0–46.0)
Hemoglobin: 9.5 g/dL — ABNORMAL LOW (ref 12.0–15.0)
MCH: 28.8 pg (ref 26.0–34.0)
MCHC: 30.7 g/dL (ref 30.0–36.0)
MCV: 93.6 fL (ref 80.0–100.0)
Platelets: 455 10*3/uL — ABNORMAL HIGH (ref 150–400)
RBC: 3.3 MIL/uL — ABNORMAL LOW (ref 3.87–5.11)
RDW: 13.3 % (ref 11.5–15.5)
WBC: 14 10*3/uL — ABNORMAL HIGH (ref 4.0–10.5)
nRBC: 0 % (ref 0.0–0.2)

## 2024-03-20 MED ORDER — ORAL CARE MOUTH RINSE: 15.0000 mL | OROMUCOSAL | Status: DC | PRN

## 2024-03-20 MED ORDER — FREE WATER
200.0000 mL | Freq: Four times a day (QID) | Status: DC
  Administered 2024-03-20 – 2024-03-27 (×26): 200 mL

## 2024-03-20 MED ORDER — ORAL CARE MOUTH RINSE
15.0000 mL | OROMUCOSAL | Status: DC
  Administered 2024-03-20 – 2024-04-02 (×50): 15 mL via OROMUCOSAL

## 2024-03-20 NOTE — Progress Notes (Signed)
 PT Cancellation Note  Patient Details Name: Chloe Wilcox MRN: [id number] DOB: [birth year]   Cancelled Treatment:    Reason Eval/Treat Not Completed: PT screened, no needs identified, will sign off (discussed with MD; pt bedbound at baseline from LTC. Dependent for transfers and has been getting up with nursing staff to chair).  Verdia Glad, PT, DPT Acute Rehabilitation Services Office [phone]    Claria Crofts 03/20/2024, 3:17 PM

## 2024-03-20 NOTE — Plan of Care (Signed)

## 2024-03-20 NOTE — Progress Notes (Signed)
 [facility] ROUNDING NOTE Chloe Wilcox WUJ:[id number]  DOB: [birth year]  DOA: 03/06/2024  PCP: Roselind Congo, MD  03/20/2024,4:05 PM  LOS: 14 days    Code Status: DNR   From:   Patent arm long-term current Dispo: likely SNF   89 year old female-at baseline and needs [name] to help remove Prior smoker chronic oxygen  2/2 gold 2 COPD  HTN depression iron deficiency anemia Previous admission right femoral fracture status post IM nail Hospitalized 11/4-11/15 hypertensive emergency?  Seizure-like activity secondary to hemosiderin deposition Keppra  started she was delirious had demand ischemia and dysphagia with severe mid esophageal dysmotility recommended dysphagia diet at discharge 03/06/24 presented from the ED seizure twitching right-sided gaze response to Versed  intubated loaded with Keppra  start propofol  admit by ICU-WBC 7 hemoglobin 10 platelet 265--sodium 144 potassium 4.1 BUN/creatinine 17/0.4 LFTs normal CXR atelectasis-CT head no acute intracranial abnormality Epileptologist consulted recommended phenytoin  Vimpat  Keppra  gabapentin  and outpatient follow-up and signed off/17 4/17 transferred to hospitalist  Plan  Toxic metabolic encephalopathy secondary to status epilepticus Main source of confusion probably seizures with low reserve and likely long latency duration to improve Continues gabapentin  100 3 times daily Vimpat  100 twice daily Keppra  750 twice daily phenytoin  twice daily and then 100 at bedtime  Low-grade temp Repeat CXR today-risk for aspiration noted-discussed with niece at bedside will discuss with POA once we get more information I did discuss the high risk of aspiration and the likelihood of recurrence  Dysphagia secondary to encephalopathy and seizures Also dysmotility which is severe from prior admission Core track placed/14 continues feeds Ensure 237 twice daily, Osmolite 50 cc/H-have added free water  200 every 6 and reassess in a.m.  AKI secondary to lnsensible losses  Start  free water  200 every 6 expect will improve-labs in the morning  Acute on chronic respiratory failure with COPD Previously on oxygen  currently on roommale Continue albuterol  2.5Q3 as needed Brovana  15 twice daily Pulmicort  0.5 twice daily Yupelri  175 daily  HTN Continue amlodipine  10 atorvastatin , ibuprofen 50 daily, PRNs in place hydralazine  metoprolol   Discussed with the niece at the bedside   DVT prophylaxis: Heparin   Status is: Inpatient Remains inpatient appropriate because:   Requires further management workup  Subjective: Hard of hearing little bit more interactive-they checked the temperature and it was 98 She needs to be fed-she moans when we move her Her niece is at the bedside No pain no fever  Objective + exam Vitals:   03/20/24 0744 03/20/24 0800 03/20/24 1125 03/20/24 1200  BP: (!) 136/53 (!) 131/56 118/63 (!) 106/52  Pulse: 94 96 94 97  Resp: (!) 23 (!) 22 (!) 27 (!) 21  Temp: 100.3 F (37.9 C)  99.7 F (37.6 C)   TempSrc: Axillary  Oral   SpO2: 99% 99% 97% 95%  Weight:      Height:       Filed Weights   03/18/24 0500 03/19/24 0310 03/20/24 0318  Weight: 40.9 kg 40.5 kg 40.8 kg    Examination: Emaciated black female-core track in place S1-S2 no murmur Chest is relatively clear posterolaterally but she does not cooperate much with exam Abdomen soft no rebound No lower extremity edema ROM intact no focal deficits  Data Reviewed: reviewed   CBC    Component Value Date/Time   WBC 14.0 (H) 03/20/2024 0621   RBC 3.30 (L) 03/20/2024 0621   HGB 9.5 (L) 03/20/2024 0621   HCT 30.9 (L) 03/20/2024 0621   PLT 455 (H) 03/20/2024 0621   MCV 93.6  03/20/2024 0621   MCH 28.8 03/20/2024 0621   MCHC 30.7 03/20/2024 0621   RDW 13.3 03/20/2024 0621   LYMPHSABS 0.6 (L) 03/06/2024 2104   MONOABS 0.3 03/06/2024 2104   EOSABS 0.1 03/06/2024 2104   BASOSABS 0.1 03/06/2024 2104      Latest Ref Rng & Units 03/20/2024    6:21 AM 03/16/2024    6:04 AM 03/12/2024     5:30 AM  CMP  Glucose 70 - 99 mg/dL 098  119  147   BUN 8 - 23 mg/dL 41  20  14   Creatinine 0.44 - 1.00 mg/dL 8.29  5.62  1.30   Sodium 135 - 145 mmol/L 139  137  140   Potassium 3.5 - 5.1 mmol/L 4.4  4.4  3.7   Chloride 98 - 111 mmol/L 99  94  101   CO2 22 - 32 mmol/L 30  32  31   Calcium  8.9 - 10.3 mg/dL 86.5  9.7  9.3   Total Protein 6.5 - 8.1 g/dL 6.3  6.0    Total Bilirubin 0.0 - 1.2 mg/dL 0.4  0.2    Alkaline Phos 38 - 126 U/L 56  55    AST 15 - 41 U/L 23  19    ALT 0 - 44 U/L 24  17      Scheduled Meds:  amLODipine   10 mg Per Tube Daily   arformoterol   15 mcg Nebulization BID   aspirin   81 mg Per Tube Daily   atorvastatin   40 mg Per Tube QHS   budesonide  (PULMICORT ) nebulizer solution  0.5 mg Nebulization BID   citalopram   10 mg Per Tube Daily   feeding supplement  237 mL Oral BID BM   gabapentin   100 mg Per Tube TID   heparin   5,000 Units Subcutaneous Q8H   irbesartan   150 mg Per Tube Daily   lacosamide   100 mg Per Tube BID   levETIRAcetam   750 mg Per Tube BID   multivitamin with minerals  1 tablet Per Tube Daily   nutrition supplement (JUVEN)  1 packet Per Tube BID BM   mouth rinse  15 mL Mouth Rinse 4 times per day   phenytoin  (DILANTIN ) IV  75 mg Intravenous BID   And   phenytoin  (DILANTIN ) IV  100 mg Intravenous QHS   QUEtiapine   25 mg Per Tube QHS   revefenacin   175 mcg Nebulization Daily   Continuous Infusions:  feeding supplement (OSMOLITE 1.2 CAL) 1,000 mL (03/20/24 1341)    Time 46  [name], MD  [facility]

## 2024-03-20 NOTE — Progress Notes (Signed)
 OT Cancellation Note  Patient Details Name: Chloe Wilcox MRN: [id number] DOB: 1935/09/27   Cancelled Treatment:    Reason Eval/Treat Not Completed: OT screened, no needs identified, will sign off. Discussed with MD; from LTC and at her functional baseline per report.   Karilyn Ouch, OTR/L [facility] Acute Rehabilitation Office: [phone]   [name] 03/20/2024, 3:26 PM

## 2024-03-20 NOTE — Progress Notes (Signed)
 Speech Language Pathology Treatment: Dysphagia  Patient Details Name: Chloe Wilcox MRN: [id number] DOB: 10-03-1935 Today's Date: 03/20/2024 Time: 7829-5621 SLP Time Calculation (min) (ACUTE ONLY): 15 min  Assessment / Plan / Recommendation Clinical Impression  Pt was encountered resting in the chair. She had difficulty maintaining alertness trial POs. She required Mod cueing to initiate straw sips today but displayed no s/s of aspiration with thin liquids. She was not alert enough to trial solids. Continue current diet. SLP will continue to f/u at least briefly to assess ability to upgrade to advanced solids.    HPI HPI: Patient is an 89 y.o. female with PMH: recent diagnosis of seizures (November 2024), dementia, FTT, HTN, COPD, depression, dysphagia. She presented to the hospital on 03/07/23 from her SNF secondary to reduced responsiveness, twitching, right sided gaze. EMS called and she received IM Versed  without improvement and seizures resolved approximately after approximately 45 minutes. In ED, she required intubation for airway protection. CT head was negative for bleed. MRI brain showed Chronic cortical/subcortical left MCA territory infarct again demonstrated within the mid and posterior left frontal lobe, left  insula and left parietal lobe  She was extubated 03/10/24. She was kept NPO awaiting SLP evaluation of swallow.      SLP Plan  Continue with current plan of care      Recommendations for follow up therapy are one component of a multi-disciplinary discharge planning process, led by the attending physician.  Recommendations may be updated based on patient status, additional functional criteria and insurance authorization.    Recommendations  Diet recommendations: Dysphagia 1 (puree);Thin liquid Liquids provided via: Cup;Straw Medication Administration: Crushed with puree Supervision: Full supervision/cueing for compensatory strategies;Trained caregiver to feed patient;Staff to  assist with self feeding Compensations: Slow rate;Small sips/bites Postural Changes and/or Swallow Maneuvers: Seated upright 90 degrees;Upright 30-60 min after meal                  Oral care BID   Frequent or constant Supervision/Assistance Dysphagia, unspecified (R13.10)     Continue with current plan of care     Amil Kale, M.A., CCC-SLP Speech Language Pathology, Acute Rehabilitation Services  Secure Chat preferred [phone]   03/20/2024, 4:44 PM

## 2024-03-21 DIAGNOSIS — G40901 Epilepsy, unspecified, not intractable, with status epilepticus: Secondary | ICD-10-CM | POA: Diagnosis not present

## 2024-03-21 LAB — GLUCOSE, CAPILLARY
Glucose-Capillary: 130 mg/dL — ABNORMAL HIGH (ref 70–99)
Glucose-Capillary: 158 mg/dL — ABNORMAL HIGH (ref 70–99)
Glucose-Capillary: 147 mg/dL — ABNORMAL HIGH (ref 70–99)
Glucose-Capillary: 126 mg/dL — ABNORMAL HIGH (ref 70–99)
Glucose-Capillary: 137 mg/dL — ABNORMAL HIGH (ref 70–99)
Glucose-Capillary: 146 mg/dL — ABNORMAL HIGH (ref 70–99)

## 2024-03-21 LAB — BASIC METABOLIC PANEL WITH GFR
Anion gap: 10 (ref 5–15)
BUN: 48 mg/dL — ABNORMAL HIGH (ref 8–23)
CO2: 30 mmol/L (ref 22–32)
Calcium: 9.9 mg/dL (ref 8.9–10.3)
Chloride: 102 mmol/L (ref 98–111)
Creatinine, Ser: 0.38 mg/dL — ABNORMAL LOW (ref 0.44–1.00)
GFR, Estimated: >60 mL/min (ref >60)
Glucose, Bld: 140 mg/dL — ABNORMAL HIGH (ref 70–99)
Potassium: 4.4 mmol/L (ref 3.5–5.1)
Sodium: 142 mmol/L (ref 135–145)

## 2024-03-21 MED ORDER — POLYVINYL ALCOHOL 1.4 % OP SOLN
1.0000 [drp] | Freq: Three times a day (TID) | OPHTHALMIC | Status: DC
  Administered 2024-03-21 – 2024-04-02 (×38): 1 [drp] via OPHTHALMIC
  Filled 2024-03-21: qty 15

## 2024-03-21 MED ORDER — DIPHENOXYLATE-ATROPINE 2.5-0.025 MG PO TABS
1.0000 | ORAL_TABLET | Freq: Four times a day (QID) | ORAL | Status: DC
  Administered 2024-03-21 – 2024-03-22 (×6): 1 via ORAL
  Filled 2024-03-21 (×6): qty 1

## 2024-03-21 NOTE — Progress Notes (Signed)
 [facility] ROUNDING NOTE VEDHA TERCERO EXB:[id number]  DOB: Nov 19, 1935  DOA: 03/06/2024  PCP: Roselind Congo, MD  03/21/2024,8:23 AM  LOS: 15 days    Code Status: DNR   From:   [name] current Dispo: likely SNF   89 year old female-at baseline and needs [name] to help remove Prior smoker chronic oxygen  2/2 gold 2 COPD  HTN depression iron deficiency anemia Previous admission right femoral fracture status post IM nail Hospitalized 11/4-11/15 hypertensive emergency?  Seizure-like activity secondary to hemosiderin deposition Keppra  started she was delirious-- had demand ischemia-- dysphagia with severe mid esophageal dysmotility recommended dysphagia diet at discharge 03/06/24 presented from the ED seizure twitching right-sided gaze response to Versed  intubated loaded with Keppra  start propofol  admit by ICU-WBC 7 hemoglobin 10 platelet 265--sodium 144 potassium 4.1 BUN/creatinine 17/0.4 LFTs normal CXR atelectasis-CT head no acute intracranial abnormality Epileptologist consulted recommended phenytoin  Vimpat  Keppra  gabapentin  and outpatient follow-up and signed off 4/17 4/17 --transferred to hospitalist  Plan  Toxic metabolic encephalopathy secondary to status epilepticus Delirium/confusion likely 2/2 seizures with low reserve and likely long latency duration to improve as per epileptologist notes, to me she is improved since yesterday I will stop her Seroquel  which was started this hospital stay--she can maintain her Celexa  10 Maintains on gabapentin  100 3 times daily Vimpat  100 twice daily Keppra  750 twice daily phenytoin  75 mg twice daily and then 100 at bedtime and overall seems to be stable  Low-grade temp, leukocytosis Repeat CXR 4/23 only atelectasis I did discuss the high risk of aspiration and the likelihood of recurrence-patient probably has silent aspiration and chemical pneumonitis I suspect low-grade temp could have been secondary to probable volume depletion  additionally  Dysphagia secondary to encephalopathy and seizures Prior admission 11/2022 severe mid esophageal dysmotility-supposed to be on dysphagia 1 diet-was eating regular food at facility Core track placed 4/14 continues feeds Ensure 237 twice daily, Osmolite 50 cc/H-added free water  200 every 6 Patient has been cleared for dysphagia 1 diet by SLP but given unlikelihood of meeting her free water  or caloric needs for now we will keep core track and likely discontinue in the next 24 to 48 hours if continues to improve  AKI secondary to lnsensible losses  Start free water  200 every 6 expect will improve-await labs from this morning and hopeful for recovery  Acute on chronic respiratory failure with COPD Previously on oxygen  currently on roommale Continue albuterol  2.5 Q3 as needed Brovana  15 twice daily Pulmicort  0.5 twice daily Yupelri  175 daily  HTN Continue amlodipine  10 , atorvastatin , irbesarta n50 daily, PRNs in place hydralazine  metoprolol   Goals of care-palliative has been engaged the goals were clear for aggressive life-prolonging measures and SNF placement although patient is DNR We will revisit these discussions in the next several days depending on clinical trajectory   DVT prophylaxis: Heparin   Status is: Inpatient Remains inpatient appropriate because:   Requires further management workup  Subjective:  She seems more awake today her speech is quite unintelligible she still has a core track in place-she thinks it is 1913 or 1330 when I ask her what year it is-continues to say I am in "green" when I ask location and place She is able to follow commands but seems quite debilitated She is unwilling to sit up for full chest exam but does not seem to be overtly coughing Food remains untouched at the bedside  Objective + exam Vitals:   03/20/24 1625 03/20/24 2000 03/20/24 2340 03/21/24 0245  BP: 133/62 Aaron Aas)  118/53 134/61 (!) 126/54  Pulse: 93 99 (!) 101 87  Resp: (!)  25 (!) 27 (!) 23 19  Temp: 98.1 F (36.7 C) 98 F (36.7 C) 98.9 F (37.2 C) 98.7 F (37.1 C)  TempSrc: Oral Oral Oral Oral  SpO2: 100% 100% 100% 97%  Weight:      Height:       Filed Weights   03/18/24 0500 03/19/24 0310 03/20/24 0318  Weight: 40.9 kg 40.5 kg 40.8 kg    Examination:  Awake coherent very sticky eyelids on the right side Extraocular movements intact on the left-core track is in place Chest is clear no wheeze no rales no rhonchi She remains in mittens ROM is intact Abdomen is soft no rebound She has a Flexi-Seal in place No lower extremity edema Psych euthymic cooperative  Data Reviewed: reviewed   CBC    Component Value Date/Time   WBC 14.0 (H) 03/20/2024 0621   RBC 3.30 (L) 03/20/2024 0621   HGB 9.5 (L) 03/20/2024 0621   HCT 30.9 (L) 03/20/2024 0621   PLT 455 (H) 03/20/2024 0621   MCV 93.6 03/20/2024 0621   MCH 28.8 03/20/2024 0621   MCHC 30.7 03/20/2024 0621   RDW 13.3 03/20/2024 0621   LYMPHSABS 0.6 (L) 03/06/2024 2104   MONOABS 0.3 03/06/2024 2104   EOSABS 0.1 03/06/2024 2104   BASOSABS 0.1 03/06/2024 2104      Latest Ref Rng & Units 03/20/2024    6:21 AM 03/16/2024    6:04 AM 03/12/2024    5:30 AM  CMP  Glucose 70 - 99 mg/dL 161  096  045   BUN 8 - 23 mg/dL 41  20  14   Creatinine 0.44 - 1.00 mg/dL 4.09  8.11  9.14   Sodium 135 - 145 mmol/L 139  137  140   Potassium 3.5 - 5.1 mmol/L 4.4  4.4  3.7   Chloride 98 - 111 mmol/L 99  94  101   CO2 22 - 32 mmol/L 30  32  31   Calcium  8.9 - 10.3 mg/dL 78.2  9.7  9.3   Total Protein 6.5 - 8.1 g/dL 6.3  6.0    Total Bilirubin 0.0 - 1.2 mg/dL 0.4  0.2    Alkaline Phos 38 - 126 U/L 56  55    AST 15 - 41 U/L 23  19    ALT 0 - 44 U/L 24  17      Scheduled Meds:  amLODipine   10 mg Per Tube Daily   arformoterol   15 mcg Nebulization BID   aspirin   81 mg Per Tube Daily   atorvastatin   40 mg Per Tube QHS   budesonide  (PULMICORT ) nebulizer solution  0.5 mg Nebulization BID   citalopram   10 mg Per  Tube Daily   feeding supplement  237 mL Oral BID BM   free water   200 mL Per Tube Q6H   gabapentin   100 mg Per Tube TID   heparin   5,000 Units Subcutaneous Q8H   irbesartan   150 mg Per Tube Daily   lacosamide   100 mg Per Tube BID   levETIRAcetam   750 mg Per Tube BID   multivitamin with minerals  1 tablet Per Tube Daily   nutrition supplement (JUVEN)  1 packet Per Tube BID BM   mouth rinse  15 mL Mouth Rinse 4 times per day   phenytoin  (DILANTIN ) IV  75 mg Intravenous BID   And   phenytoin  (DILANTIN ) IV  100 mg Intravenous QHS   QUEtiapine   25 mg Per Tube QHS   revefenacin   175 mcg Nebulization Daily   Continuous Infusions:  feeding supplement (OSMOLITE 1.2 CAL) 1,000 mL (03/21/24 0604)    Time 36  Jai-Gurmukh Arlen Dupuis, MD  [facility]

## 2024-03-21 NOTE — Plan of Care (Signed)
  Problem: Health Behavior/Discharge Planning: Goal: Ability to manage health-related needs will improve Outcome: Progressing   Problem: Clinical Measurements: Goal: Will remain free from infection Outcome: Progressing Goal: Diagnostic test results will improve Outcome: Progressing Goal: Respiratory complications will improve Outcome: Progressing   

## 2024-03-22 DIAGNOSIS — G40901 Epilepsy, unspecified, not intractable, with status epilepticus: Secondary | ICD-10-CM | POA: Diagnosis not present

## 2024-03-22 LAB — CBC WITH DIFFERENTIAL/PLATELET
Abs Immature Granulocytes: 0.06 10*3/uL (ref 0.00–0.07)
Basophils Absolute: 0.1 10*3/uL (ref 0.0–0.1)
Basophils Relative: 1 %
Eosinophils Absolute: 0.2 10*3/uL (ref 0.0–0.5)
Eosinophils Relative: 2 %
HCT: 30.2 % — ABNORMAL LOW (ref 36.0–46.0)
Hemoglobin: 9.1 g/dL — ABNORMAL LOW (ref 12.0–15.0)
Immature Granulocytes: 1 %
Lymphocytes Relative: 6 %
Lymphs Abs: 0.7 10*3/uL (ref 0.7–4.0)
MCH: 28.8 pg (ref 26.0–34.0)
MCHC: 30.1 g/dL (ref 30.0–36.0)
MCV: 95.6 fL (ref 80.0–100.0)
Monocytes Absolute: 0.9 10*3/uL (ref 0.1–1.0)
Monocytes Relative: 7 %
Neutro Abs: 10.4 10*3/uL — ABNORMAL HIGH (ref 1.7–7.7)
Neutrophils Relative %: 83 %
Platelets: 412 10*3/uL — ABNORMAL HIGH (ref 150–400)
RBC: 3.16 MIL/uL — ABNORMAL LOW (ref 3.87–5.11)
RDW: 13.6 % (ref 11.5–15.5)
WBC: 12.3 10*3/uL — ABNORMAL HIGH (ref 4.0–10.5)
nRBC: 0 % (ref 0.0–0.2)

## 2024-03-22 LAB — GLUCOSE, CAPILLARY
Glucose-Capillary: 154 mg/dL — ABNORMAL HIGH (ref 70–99)
Glucose-Capillary: 140 mg/dL — ABNORMAL HIGH (ref 70–99)
Glucose-Capillary: 143 mg/dL — ABNORMAL HIGH (ref 70–99)
Glucose-Capillary: 139 mg/dL — ABNORMAL HIGH (ref 70–99)
Glucose-Capillary: 126 mg/dL — ABNORMAL HIGH (ref 70–99)
Glucose-Capillary: 132 mg/dL — ABNORMAL HIGH (ref 70–99)

## 2024-03-22 LAB — BASIC METABOLIC PANEL WITH GFR
Anion gap: 11 (ref 5–15)
BUN: 43 mg/dL — ABNORMAL HIGH (ref 8–23)
CO2: 28 mmol/L (ref 22–32)
Calcium: 9.9 mg/dL (ref 8.9–10.3)
Chloride: 103 mmol/L (ref 98–111)
Creatinine, Ser: 0.38 mg/dL — ABNORMAL LOW (ref 0.44–1.00)
GFR, Estimated: >60 mL/min (ref >60)
Glucose, Bld: 135 mg/dL — ABNORMAL HIGH (ref 70–99)
Potassium: 4.7 mmol/L (ref 3.5–5.1)
Sodium: 142 mmol/L (ref 135–145)

## 2024-03-22 MED ORDER — AMLODIPINE BESYLATE 10 MG PO TABS: 10.0000 mg | ORAL_TABLET | Freq: Every day | ORAL | Status: DC

## 2024-03-22 MED ORDER — CITALOPRAM HYDROBROMIDE 20 MG PO TABS: 10.0000 mg | ORAL_TABLET | Freq: Every day | ORAL | Status: DC

## 2024-03-22 MED ORDER — CITALOPRAM HYDROBROMIDE 20 MG PO TABS
10.0000 mg | ORAL_TABLET | Freq: Every day | ORAL | Status: DC
  Administered 2024-03-23 – 2024-03-31 (×9): 10 mg
  Filled 2024-03-22 (×9): qty 1

## 2024-03-22 MED ORDER — ADULT MULTIVITAMIN W/MINERALS CH
1.0000 | ORAL_TABLET | Freq: Every day | ORAL | Status: DC
  Administered 2024-03-23 – 2024-03-31 (×9): 1
  Filled 2024-03-22 (×9): qty 1

## 2024-03-22 MED ORDER — LACOSAMIDE 50 MG PO TABS: 100.0000 mg | ORAL_TABLET | Freq: Two times a day (BID) | ORAL | Status: DC

## 2024-03-22 MED ORDER — DIPHENOXYLATE-ATROPINE 2.5-0.025 MG PO TABS
1.0000 | ORAL_TABLET | Freq: Four times a day (QID) | ORAL | Status: DC
  Administered 2024-03-22 – 2024-03-30 (×32): 1
  Filled 2024-03-22 (×32): qty 1

## 2024-03-22 MED ORDER — GABAPENTIN 250 MG/5ML PO SOLN
100.0000 mg | Freq: Three times a day (TID) | ORAL | Status: DC
  Filled 2024-03-22 (×2): qty 2

## 2024-03-22 MED ORDER — AMLODIPINE BESYLATE 10 MG PO TABS
10.0000 mg | ORAL_TABLET | Freq: Every day | ORAL | Status: DC
  Administered 2024-03-23 – 2024-03-31 (×9): 10 mg
  Filled 2024-03-22 (×9): qty 1

## 2024-03-22 MED ORDER — SODIUM CHLORIDE 0.9 % IV SOLN: INTRAVENOUS | Status: DC

## 2024-03-22 MED ORDER — LACOSAMIDE 50 MG PO TABS
100.0000 mg | ORAL_TABLET | Freq: Two times a day (BID) | ORAL | Status: DC
  Administered 2024-03-22 – 2024-03-31 (×19): 100 mg
  Filled 2024-03-22 (×19): qty 2

## 2024-03-22 MED ORDER — ATORVASTATIN CALCIUM 40 MG PO TABS
40.0000 mg | ORAL_TABLET | Freq: Every day | ORAL | Status: DC
  Administered 2024-03-22 – 2024-03-31 (×10): 40 mg
  Filled 2024-03-22 (×10): qty 1

## 2024-03-22 MED ORDER — ATORVASTATIN CALCIUM 40 MG PO TABS: 40.0000 mg | ORAL_TABLET | Freq: Every day | ORAL | Status: DC

## 2024-03-22 MED ORDER — LEVETIRACETAM 750 MG PO TABS
750.0000 mg | ORAL_TABLET | Freq: Two times a day (BID) | ORAL | Status: DC
  Administered 2024-03-22 – 2024-03-31 (×19): 750 mg
  Filled 2024-03-22 (×19): qty 1

## 2024-03-22 MED ORDER — ASPIRIN 81 MG PO CHEW
81.0000 mg | CHEWABLE_TABLET | Freq: Every day | ORAL | Status: DC
  Administered 2024-03-23 – 2024-03-31 (×9): 81 mg
  Filled 2024-03-22 (×9): qty 1

## 2024-03-22 MED ORDER — ASPIRIN 81 MG PO CHEW: 81.0000 mg | CHEWABLE_TABLET | Freq: Every day | ORAL | Status: DC

## 2024-03-22 MED ORDER — ADULT MULTIVITAMIN W/MINERALS CH: 1.0000 | ORAL_TABLET | Freq: Every day | ORAL | Status: DC

## 2024-03-22 MED ORDER — GABAPENTIN 250 MG/5ML PO SOLN
100.0000 mg | Freq: Three times a day (TID) | ORAL | Status: DC
  Administered 2024-03-22 – 2024-04-01 (×30): 100 mg
  Filled 2024-03-22 (×32): qty 2

## 2024-03-22 MED ORDER — LEVETIRACETAM 750 MG PO TABS: 750.0000 mg | ORAL_TABLET | Freq: Two times a day (BID) | ORAL | Status: DC

## 2024-03-22 MED ORDER — ENSURE ENLIVE PO LIQD
237.0000 mL | Freq: Two times a day (BID) | ORAL | Status: DC
  Administered 2024-03-24 – 2024-03-31 (×9): 237 mL
  Filled 2024-03-22: qty 237

## 2024-03-22 NOTE — Progress Notes (Addendum)
 [facility] ROUNDING NOTE Chloe Wilcox [id number]:[id number]  DOB: July 01, 1935  DOA: 03/06/2024  PCP: Chloe Congo, MD  03/22/2024,9:12 AM  LOS: 16 days    Code Status: DNR   [facility]   [facility] current Dispo: likely SNF   89 year old female-at baseline and needs Chloe Wilcox to help remove Prior smoker chronic oxygen  2/2 gold 2 COPD  HTN depression iron deficiency anemia Previous admission right femoral fracture status post IM nail Hospitalized 11/4-11/15 hypertensive emergency?  Seizure-like activity secondary to hemosiderin deposition Keppra  started she was delirious-- had demand ischemia-- dysphagia with severe mid esophageal dysmotility recommended dysphagia diet at discharge 03/06/24 presented from the ED seizure twitching right-sided gaze response to Versed  intubated loaded with Keppra  start propofol  admit by ICU-WBC 7 hemoglobin 10 platelet 265--sodium 144 potassium 4.1 BUN/creatinine 17/0.4 LFTs normal CXR atelectasis-CT head no acute intracranial abnormality Epileptologist consulted recommended phenytoin  Vimpat  Keppra  gabapentin  and outpatient follow-up and signed off 4/17 4/17 --transferred to hospitalist  Plan  Toxic metabolic encephalopathy secondary to status epilepticus Delirium/confusion likely 2/2 seizures with low reserve/ long duration to improve as per epileptologist  Stopped hospital Seroquel -- can maintain her Celexa  10 Mentation overall stable improved some but is a little bit sleepy today on gabapentin  100 3 times daily Vimpat  100 twice daily Keppra  750 twice daily phenytoin  75 mg twice daily and then 100 at bedtime   Low-grade temp, leukocytosis--Repeat CXR 4/23 only atelectasis Relative risk for aspiration --possible silent Continues to have low-grade temps at times with mild white count elevation-see below discussion  Dysphagia secondary to encephalopathy and seizures Prior admission 11/2022 severe mid esophageal dysmotility-supposed to be on dysphagia 1 diet-was eating regular  food at facility Core track placed 4/14 continues feeds Ensure 237 twice daily, Osmolite 50 cc/H-added free water  200 every 6 as risk for volume depletion cleared for dysphagia 1 diet by SLP-attempt diet and reeval by speech when able  AKI secondary to lnsensible losses ARB Start free water  200 every 6 expect will improve-start saline 40 cc/H as her azotemia has not improved I have discontinued irbesartan  150 and we will watch kidney function  Acute on chronic respiratory failure with COPD Previously on oxygen  currently on roommale Continue albuterol  2.5 Q3 as needed Brovana  15 twice daily Pulmicort  0.5 twice daily Yupelri  175 daily  HTN Continue amlodipine  10 , atorvastatin , PRNs in place hydralazine  metoprolol   Goals of care-palliative has been engaged -d/w daughter on phone--prior patient was coherent and had no issues--some confusion at times.  She knew who people were and and patient was coherent-She has made some decline since hospitalization presumably because of medications encephalopathy from seizures etc. I have asked speech therapy to reevaluate as patient may require further instrumental testing etc. I have shared with family that I think overall she has made some improvement and that next several days would be telling in terms of trajectory-I do think she is at high risk for recurrence of aspiration because of her prior episodes and also because of her risk of seizures etc. as she is quite frail  DVT prophylaxis: Heparin   Status is: Inpatient Remains inpatient appropriate because:   Requires further management workup  Subjective:  Speech unintelligible-slight increased work of breathing-moans and groans-remains in Chloe Wilcox remains untouched at the bedside   Objective + exam Vitals:   03/21/24 1934 03/21/24 2313 03/22/24 0339 03/22/24 0759  BP: (!) 112/53 124/62 (!) 124/59 (!) 127/91  Pulse: 85 81 81 83  Resp: 18 17 18 19   Temp: 99.2  F (37.3 C) 99 F (37.2 C)  98.8 F (37.1 C) 99.5 F (37.5 C)  TempSrc: Axillary Axillary Axillary Axillary  SpO2: 100% 100% 100% 100%  Weight:      Height:       Filed Weights   03/18/24 0500 03/19/24 0310 03/20/24 0318  Weight: 40.9 kg 40.5 kg 40.8 kg    Examination:  Right eyelid is better-core track in place She has slightly increased work of breathing-chest is clear No nausea no vomiting Abdomen is soft no rebound no guarding ROM is intact no focal deficit  moving upper limbs equally but quite weak in lower    Data Reviewed: reviewed   CBC    Component Value Date/Time   WBC 12.3 (H) 03/22/2024 0604   RBC 3.16 (L) 03/22/2024 0604   HGB 9.1 (L) 03/22/2024 0604   HCT 30.2 (L) 03/22/2024 0604   PLT 412 (H) 03/22/2024 0604   MCV 95.6 03/22/2024 0604   MCH 28.8 03/22/2024 0604   MCHC 30.1 03/22/2024 0604   RDW 13.6 03/22/2024 0604   LYMPHSABS 0.7 03/22/2024 0604   MONOABS 0.9 03/22/2024 0604   EOSABS 0.2 03/22/2024 0604   BASOSABS 0.1 03/22/2024 0604      Latest Ref Rng & Units 03/22/2024    6:04 AM 03/21/2024    8:01 AM 03/20/2024    6:21 AM  CMP  Glucose 70 - 99 mg/dL 045  409  811   BUN 8 - 23 mg/dL 43  48  41   Creatinine 0.44 - 1.00 mg/dL 9.14  7.82  9.56   Sodium 135 - 145 mmol/L 142  142  139   Potassium 3.5 - 5.1 mmol/L 4.7  4.4  4.4   Chloride 98 - 111 mmol/L 103  102  99   CO2 22 - 32 mmol/L 28  30  30    Calcium  8.9 - 10.3 mg/dL 9.9  9.9  21.3   Total Protein 6.5 - 8.1 g/dL   6.3   Total Bilirubin 0.0 - 1.2 mg/dL   0.4   Alkaline Phos 38 - 126 U/L   56   AST 15 - 41 U/L   23   ALT 0 - 44 U/L   24     Scheduled Meds:  amLODipine   10 mg Per Tube Daily   arformoterol   15 mcg Nebulization BID   aspirin   81 mg Per Tube Daily   atorvastatin   40 mg Per Tube QHS   budesonide  (PULMICORT ) nebulizer solution  0.5 mg Nebulization BID   citalopram   10 mg Per Tube Daily   diphenoxylate -atropine   1 tablet Oral QID   feeding supplement  237 mL Oral BID BM   free water   200 mL Per  Tube Q6H   gabapentin   100 mg Per Tube TID   heparin   5,000 Units Subcutaneous Q8H   lacosamide   100 mg Per Tube BID   levETIRAcetam   750 mg Per Tube BID   multivitamin with minerals  1 tablet Per Tube Daily   nutrition supplement (JUVEN)  1 packet Per Tube BID BM   mouth rinse  15 mL Mouth Rinse 4 times per day   phenytoin  (DILANTIN ) IV  75 mg Intravenous BID   And   phenytoin  (DILANTIN ) IV  100 mg Intravenous QHS   polyvinyl alcohol   1 drop Right Eye TID   revefenacin   175 mcg Nebulization Daily   Continuous Infusions:  sodium chloride      feeding supplement (OSMOLITE 1.2  CAL) 1,000 mL (03/22/24 0447)    Time 46  [name], MD  [facility]

## 2024-03-22 NOTE — Progress Notes (Signed)
 Speech Language Pathology Treatment: Dysphagia  Patient Details Name: Chloe Wilcox MRN: [id number] DOB: 06/10/1935 Today's Date: 03/22/2024 Time: 7829-5621 SLP Time Calculation (min) (ACUTE ONLY): 16 min  Assessment / Plan / Recommendation Clinical Impression  Pt continues to present with a cognitively-based dysphagia and overall presentation appears similar to previous SLP visits 09/2023. Given frequent encouragement and cueing to clear her oral cavity, she ate pureed fruit. Note oral holding and pocketing, which she cleared with Max cueing. She was unable to initiate a sip via straw today but promptly initiated a swallow response when thin liquids were siphoned into her oral cavity. Considering results of recent esophagram (09/2023) showing esophageal dysmotility with resulting contrast stasis and pooling in the mid esophagus in addition to frequent eructation, suspect pt's most significant risk for aspiration is post-prandial. She demonstrates no s/s concerning for aspiration prandially. Recommend continuing current diet of Dys 1 solids with thin liquids and meds crushed in puree. She will require full supervision to assist with feeding and cue her to clear her oral cavity. She may also benefit from finger foods. Our service will sign off at this time.    HPI HPI: Patient is an 89 y.o. female with PMH: recent diagnosis of seizures (November 2024), dementia, FTT, HTN, COPD, depression, dysphagia. She presented to the hospital on 03/07/23 from her SNF secondary to reduced responsiveness, twitching, right sided gaze. EMS called and she received IM Versed  without improvement and seizures resolved approximately after approximately 45 minutes. In ED, she required intubation for airway protection. CT head was negative for bleed. MRI brain showed Chronic cortical/subcortical left MCA territory infarct again demonstrated within the mid and posterior left frontal lobe, left  insula and left parietal lobe  She was  extubated 03/10/24. She was kept NPO awaiting SLP evaluation of swallow.      SLP Plan  All goals met      Recommendations for follow up therapy are one component of a multi-disciplinary discharge planning process, led by the attending physician.  Recommendations may be updated based on patient status, additional functional criteria and insurance authorization.    Recommendations  Diet recommendations: Dysphagia 1 (puree);Thin liquid Liquids provided via: Cup;Straw Medication Administration: Crushed with puree Supervision: Full supervision/cueing for compensatory strategies;Trained caregiver to feed patient;Staff to assist with self feeding Compensations: Slow rate;Small sips/bites Postural Changes and/or Swallow Maneuvers: Seated upright 90 degrees;Upright 30-60 min after meal                  Oral care BID   Frequent or constant Supervision/Assistance Dysphagia, unspecified (R13.10)     All goals met     Amil Kale, M.A., CCC-SLP Speech Language Pathology, Acute Rehabilitation Services  Secure Chat preferred [phone]   03/22/2024, 1:10 PM

## 2024-03-22 NOTE — Progress Notes (Signed)
 Per Haywood Lisle MD, this RN attempted to reorient patient. Patient continues to be alert and oriented to self only. Speech slurred and incomprehensible. Delirium precautions in place. Lights on and blinds open. Gently stimulating patient with either TV or gospel music (per granddaughter, patient loves). Attempted to remove safety mittens. Patient immediately starts to reach up to cortrak. Safety mittens back in place.

## 2024-03-23 DIAGNOSIS — G40901 Epilepsy, unspecified, not intractable, with status epilepticus: Secondary | ICD-10-CM | POA: Diagnosis not present

## 2024-03-23 LAB — CBC WITH DIFFERENTIAL/PLATELET
Abs Immature Granulocytes: 0.07 10*3/uL (ref 0.00–0.07)
Basophils Absolute: 0.1 10*3/uL (ref 0.0–0.1)
Basophils Relative: 1 %
Eosinophils Absolute: 0.2 10*3/uL (ref 0.0–0.5)
Eosinophils Relative: 1 %
HCT: 32.2 % — ABNORMAL LOW (ref 36.0–46.0)
Hemoglobin: 9.9 g/dL — ABNORMAL LOW (ref 12.0–15.0)
Immature Granulocytes: 1 %
Lymphocytes Relative: 7 %
Lymphs Abs: 0.8 10*3/uL (ref 0.7–4.0)
MCH: 28.9 pg (ref 26.0–34.0)
MCHC: 30.7 g/dL (ref 30.0–36.0)
MCV: 94.2 fL (ref 80.0–100.0)
Monocytes Absolute: 0.9 10*3/uL (ref 0.1–1.0)
Monocytes Relative: 7 %
Neutro Abs: 9.6 10*3/uL — ABNORMAL HIGH (ref 1.7–7.7)
Neutrophils Relative %: 83 %
Platelets: 393 10*3/uL (ref 150–400)
RBC: 3.42 MIL/uL — ABNORMAL LOW (ref 3.87–5.11)
RDW: 13.6 % (ref 11.5–15.5)
WBC: 11.5 10*3/uL — ABNORMAL HIGH (ref 4.0–10.5)
nRBC: 0 % (ref 0.0–0.2)

## 2024-03-23 LAB — BASIC METABOLIC PANEL WITH GFR
Anion gap: 8 (ref 5–15)
BUN: 35 mg/dL — ABNORMAL HIGH (ref 8–23)
CO2: 31 mmol/L (ref 22–32)
Calcium: 10 mg/dL (ref 8.9–10.3)
Chloride: 100 mmol/L (ref 98–111)
Creatinine, Ser: 0.34 mg/dL — ABNORMAL LOW (ref 0.44–1.00)
GFR, Estimated: >60 mL/min (ref >60)
Glucose, Bld: 124 mg/dL — ABNORMAL HIGH (ref 70–99)
Potassium: 4.7 mmol/L (ref 3.5–5.1)
Sodium: 139 mmol/L (ref 135–145)

## 2024-03-23 LAB — GLUCOSE, CAPILLARY
Glucose-Capillary: 109 mg/dL — ABNORMAL HIGH (ref 70–99)
Glucose-Capillary: 111 mg/dL — ABNORMAL HIGH (ref 70–99)
Glucose-Capillary: 116 mg/dL — ABNORMAL HIGH (ref 70–99)
Glucose-Capillary: 120 mg/dL — ABNORMAL HIGH (ref 70–99)
Glucose-Capillary: 122 mg/dL — ABNORMAL HIGH (ref 70–99)
Glucose-Capillary: 134 mg/dL — ABNORMAL HIGH (ref 70–99)

## 2024-03-23 MED ORDER — SODIUM CHLORIDE 0.9 % IV SOLN: INTRAVENOUS | Status: AC

## 2024-03-23 NOTE — Plan of Care (Signed)
  Problem: Education: Goal: Knowledge of General Education information will improve Description: Including pain rating scale, medication(s)/side effects and non-pharmacologic comfort measures Outcome: Progressing   Problem: Health Behavior/Discharge Planning: Goal: Ability to manage health-related needs will improve Outcome: Progressing   Problem: Clinical Measurements: Goal: Ability to maintain clinical measurements within normal limits will improve Outcome: Progressing Goal: Will remain free from infection Outcome: Progressing Goal: Diagnostic test results will improve Outcome: Progressing Goal: Respiratory complications will improve Outcome: Progressing Goal: Cardiovascular complication will be avoided Outcome: Progressing   Problem: Activity: Goal: Risk for activity intolerance will decrease Outcome: Progressing   Problem: Nutrition: Goal: Adequate nutrition will be maintained Outcome: Progressing   Problem: Elimination: Goal: Will not experience complications related to bowel motility Outcome: Progressing Goal: Will not experience complications related to urinary retention Outcome: Progressing   Problem: Pain Managment: Goal: General experience of comfort will improve and/or be controlled Outcome: Progressing   Problem: Safety: Goal: Ability to remain free from injury will improve Outcome: Progressing   Problem: Skin Integrity: Goal: Risk for impaired skin integrity will decrease Outcome: Progressing   Problem: Education: Goal: Expressions of having a comfortable level of knowledge regarding the disease process will increase Outcome: Progressing   Problem: Coping: Goal: Ability to adjust to condition or change in health will improve Outcome: Progressing Goal: Ability to identify appropriate support needs will improve Outcome: Progressing   Problem: Health Behavior/Discharge Planning: Goal: Compliance with prescribed medication regimen will  improve Outcome: Progressing   Problem: Medication: Goal: Risk for medication side effects will decrease Outcome: Progressing   Problem: Clinical Measurements: Goal: Complications related to the disease process, condition or treatment will be avoided or minimized Outcome: Progressing Goal: Diagnostic test results will improve Outcome: Progressing   Problem: Safety: Goal: Verbalization of understanding the information provided will improve Outcome: Progressing   Problem: Self-Concept: Goal: Ability to verbalize feelings about condition will improve Outcome: Progressing

## 2024-03-23 NOTE — Progress Notes (Signed)
 [facility] ROUNDING NOTE Chloe Wilcox [id number]  DOB: [birth year]  DOA: 03/06/2024  PCP: Roselind Congo, MD  03/23/2024,2:11 PM  LOS: 17 days    Code Status: DNR   From:   [name] current Dispo: likely SNF   89 year old female-at baseline and needs [name] to help remove Prior smoker chronic oxygen  2/2 gold 2 COPD  HTN depression iron deficiency anemia Previous admission right femoral fracture status post IM nail Hospitalized 11/4-11/15 hypertensive emergency?  Seizure-like activity secondary to hemosiderin deposition Keppra  started she was delirious-- had demand ischemia-- dysphagia with severe mid esophageal dysmotility recommended dysphagia diet at discharge 03/06/24 presented from the ED seizure twitching right-sided gaze response to Versed  intubated loaded with Keppra  start propofol  admit by ICU-WBC 7 hemoglobin 10 platelet 265--sodium 144 potassium 4.1 BUN/creatinine 17/0.4 LFTs normal CXR atelectasis-CT head no acute intracranial abnormality Epileptologist consulted recommended phenytoin  Vimpat  Keppra  gabapentin  and outpatient follow-up and signed off 4/17 4/17 --transferred to hospitalist  Plan  Toxic metabolic encephalopathy secondary to status epilepticus Delirium/confusion likely 2/2 seizures with low reserve/ long duration to improve as per epileptologist-she is somewhat confused today Stopped hospital Seroquel --continue Celexa  10 Continue gabapentin  100 bd Vimpat  100 bd Keppra  750 bd phenytoin  75 mg twice daily and then 100 at bedtime   Low-grade temp, leukocytosis--Repeat CXR 4/23 only atelectasis Relative risk for aspiration --possible silent Continues to have low-grade temps at times with mild white count elevation-see below discussion  Dysphagia secondary to encephalopathy and seizures Prior admission 11/2022 severe mid esophageal dysmotility-supposed to be on dysphagia 1 diet-was eating regular food at facility Core track placed 4/14 continues feeds Ensure 237 twice  daily, Osmolite 50 cc/H-added free water  200 every 6 as risk for volume depletion cleared for dysphagia 1 diet by SLP-high risk for pocketing and aspiration  AKI secondary to lnsensible losses ARB Start free water  200 every 6 -continue saline 40 cc/H  I have discontinued irbesartan  150 and we will watch kidney function  Acute on chronic respiratory failure with COPD Previously on oxygen  currently on roommale Continue albuterol  2.5 Q3 as needed Brovana  15 twice daily Pulmicort  0.5 twice daily Yupelri  175 daily  HTN Continue amlodipine  10 , atorvastatin , PRNs in place hydralazine  metoprolol   Goals of care-palliative has been engaged -d/w daughter on phone-4/25-prior patient was coherent and had no issues--some confusion at times.  She knew who people were and and patient was coherent-She has made some decline since hospitalization presumably because of medications encephalopathy from seizures etc. Remains disoriented today but is  pleasant  DVT prophylaxis: Heparin   Status is: Inpatient Remains inpatient appropriate because:   Requires further management workup  Subjective:  She thinks she is in [place]  and responds with the same response "I am in [place] " when I ask her what date or year She is eating at the bedside with tach although there is a little bit of residue at the front of her mouth She moans groans and screams when I try to move her   Objective + exam Vitals:   03/23/24 0317 03/23/24 0703 03/23/24 0741 03/23/24 1112  BP: 129/65   133/74  Pulse: 73   87  Resp: 15   (!) 21  Temp: 97.7 F (36.5 C)   98.6 F (37 C)  TempSrc: Oral  Axillary Axillary  SpO2: 100%   100%  Weight:  41.9 kg    Height:       Filed Weights   03/19/24 0310 03/20/24 0318 03/23/24 0703  Weight:  40.5 kg 40.8 kg 41.9 kg    Examination:  Right eyelid is better-core track in place Chest overall clear S1-S2 no murmur Abdomen soft no rebound no guarding Moving 4 limbs  equally no seizures or shakes  Data Reviewed: reviewed   CBC    Component Value Date/Time   WBC 11.5 (H) 03/23/2024 0715   RBC 3.42 (L) 03/23/2024 0715   HGB 9.9 (L) 03/23/2024 0715   HCT 32.2 (L) 03/23/2024 0715   PLT 393 03/23/2024 0715   MCV 94.2 03/23/2024 0715   MCH 28.9 03/23/2024 0715   MCHC 30.7 03/23/2024 0715   RDW 13.6 03/23/2024 0715   LYMPHSABS 0.8 03/23/2024 0715   MONOABS 0.9 03/23/2024 0715   EOSABS 0.2 03/23/2024 0715   BASOSABS 0.1 03/23/2024 0715      Latest Ref Rng & Units 03/23/2024    7:15 AM 03/22/2024    6:04 AM 03/21/2024    8:01 AM  CMP  Glucose 70 - 99 mg/dL 161  096  045   BUN 8 - 23 mg/dL 35  43  48   Creatinine 0.44 - 1.00 mg/dL 4.09  8.11  9.14   Sodium 135 - 145 mmol/L 139  142  142   Potassium 3.5 - 5.1 mmol/L 4.7  4.7  4.4   Chloride 98 - 111 mmol/L 100  103  102   CO2 22 - 32 mmol/L 31  28  30    Calcium  8.9 - 10.3 mg/dL 78.2  9.9  9.9     Scheduled Meds:  amLODipine   10 mg Per Tube Daily   arformoterol   15 mcg Nebulization BID   aspirin   81 mg Per Tube Daily   atorvastatin   40 mg Per Tube QHS   budesonide  (PULMICORT ) nebulizer solution  0.5 mg Nebulization BID   citalopram   10 mg Per Tube Daily   diphenoxylate -atropine   1 tablet Per Tube QID   feeding supplement  237 mL Per Tube BID BM   free water   200 mL Per Tube Q6H   gabapentin   100 mg Per Tube TID   heparin   5,000 Units Subcutaneous Q8H   lacosamide   100 mg Per Tube BID   levETIRAcetam   750 mg Per Tube BID   multivitamin with minerals  1 tablet Per Tube Daily   nutrition supplement (JUVEN)  1 packet Per Tube BID BM   mouth rinse  15 mL Mouth Rinse 4 times per day   phenytoin  (DILANTIN ) IV  75 mg Intravenous BID   And   phenytoin  (DILANTIN ) IV  100 mg Intravenous QHS   polyvinyl alcohol   1 drop Right Eye TID   revefenacin   175 mcg Nebulization Daily   Continuous Infusions:  sodium chloride      feeding supplement (OSMOLITE 1.2 CAL) 1,000 mL (03/22/24 2106)    Time  26  Chloe Glisson, MD  [facility]

## 2024-03-24 DIAGNOSIS — G40901 Epilepsy, unspecified, not intractable, with status epilepticus: Secondary | ICD-10-CM | POA: Diagnosis not present

## 2024-03-24 LAB — GLUCOSE, CAPILLARY
Glucose-Capillary: 136 mg/dL — ABNORMAL HIGH (ref 70–99)
Glucose-Capillary: 122 mg/dL — ABNORMAL HIGH (ref 70–99)
Glucose-Capillary: 125 mg/dL — ABNORMAL HIGH (ref 70–99)
Glucose-Capillary: 118 mg/dL — ABNORMAL HIGH (ref 70–99)
Glucose-Capillary: 136 mg/dL — ABNORMAL HIGH (ref 70–99)
Glucose-Capillary: 141 mg/dL — ABNORMAL HIGH (ref 70–99)

## 2024-03-24 LAB — CBC WITH DIFFERENTIAL/PLATELET
Abs Immature Granulocytes: 0.07 10*3/uL (ref 0.00–0.07)
Basophils Absolute: 0.1 10*3/uL (ref 0.0–0.1)
Basophils Relative: 1 %
Eosinophils Absolute: 0.1 10*3/uL (ref 0.0–0.5)
Eosinophils Relative: 1 %
HCT: 31.3 % — ABNORMAL LOW (ref 36.0–46.0)
Hemoglobin: 9.5 g/dL — ABNORMAL LOW (ref 12.0–15.0)
Immature Granulocytes: 1 %
Lymphocytes Relative: 7 %
Lymphs Abs: 0.7 10*3/uL (ref 0.7–4.0)
MCH: 28.8 pg (ref 26.0–34.0)
MCHC: 30.4 g/dL (ref 30.0–36.0)
MCV: 94.8 fL (ref 80.0–100.0)
Monocytes Absolute: 1 10*3/uL (ref 0.1–1.0)
Monocytes Relative: 9 %
Neutro Abs: 8.8 10*3/uL — ABNORMAL HIGH (ref 1.7–7.7)
Neutrophils Relative %: 81 %
Platelets: 408 10*3/uL — ABNORMAL HIGH (ref 150–400)
RBC: 3.3 MIL/uL — ABNORMAL LOW (ref 3.87–5.11)
RDW: 13.5 % (ref 11.5–15.5)
WBC: 10.7 10*3/uL — ABNORMAL HIGH (ref 4.0–10.5)
nRBC: 0 % (ref 0.0–0.2)

## 2024-03-24 LAB — BASIC METABOLIC PANEL WITH GFR
Anion gap: 9 (ref 5–15)
BUN: 32 mg/dL — ABNORMAL HIGH (ref 8–23)
CO2: 30 mmol/L (ref 22–32)
Calcium: 9.7 mg/dL (ref 8.9–10.3)
Chloride: 100 mmol/L (ref 98–111)
Creatinine, Ser: 0.42 mg/dL — ABNORMAL LOW (ref 0.44–1.00)
GFR, Estimated: >60 mL/min (ref >60)
Glucose, Bld: 132 mg/dL — ABNORMAL HIGH (ref 70–99)
Potassium: 4.3 mmol/L (ref 3.5–5.1)
Sodium: 139 mmol/L (ref 135–145)

## 2024-03-24 MED ORDER — CARMEX CLASSIC LIP BALM EX OINT
TOPICAL_OINTMENT | CUTANEOUS | Status: DC | PRN
  Administered 2024-03-26 – 2024-04-02 (×5): 1 via TOPICAL
  Filled 2024-03-24: qty 10

## 2024-03-24 NOTE — Plan of Care (Signed)
  Problem: Education: Goal: Knowledge of General Education information will improve Description: Including pain rating scale, medication(s)/side effects and non-pharmacologic comfort measures Outcome: Not Progressing Note: Patient remains confused, able to follow any command with poor oral intake.

## 2024-03-24 NOTE — Progress Notes (Signed)
 Attempt to feed patient with breakfast, patient noted to pocketing her food, oral care provided pre and post breakfast. Please see doc flow sheet for intake and output documentation.

## 2024-03-24 NOTE — Progress Notes (Signed)
   03/24/24 1131  Intake (mL)  P.O. 20 mL  Percent Meals Eaten (%) 5 %   Attempted to feed lunch again. Oral care provided pre and post feed. Patient continued to hold food in her mouth, not swallowing, although agreed to wanting to eat.

## 2024-03-24 NOTE — Progress Notes (Signed)
 [facility] ROUNDING NOTE CHARRISSA GETTINGS ZOX:[id number]  DOB: 09-Aug-1935  DOA: 03/06/2024  PCP: Roselind Congo, MD  03/24/2024,1:25 PM  LOS: 18 days    Code Status: DNR   From:   [name] current Dispo: likely SNF    89 year old female-at baseline and needs [name] to help remove Prior smoker chronic oxygen  2/2 gold 2 COPD  HTN depression iron deficiency anemia Previous admission right femoral fracture status post IM nail Hospitalized 11/4-11/15 hypertensive emergency?  Seizure-like activity secondary to hemosiderin deposition Keppra  started she was delirious-- had demand ischemia-- dysphagia with severe mid esophageal dysmotility recommended dysphagia diet at discharge 03/06/24 presented from the ED seizure twitching right-sided gaze response to Versed  intubated loaded with Keppra  start propofol  admit by ICU-WBC 7 hemoglobin 10 platelet 265--sodium 144 potassium 4.1 BUN/creatinine 17/0.4 LFTs normal CXR atelectasis-CT head no acute intracranial abnormality Epileptologist consulted recommended phenytoin  Vimpat  Keppra  gabapentin  and outpatient follow-up and signed off 4/17 4/17 --transferred to hospitalist  Plan  Toxic metabolic encephalopathy secondary to status epilepticus Delirium/confusion likely 2/2 seizures with low reserve/ long duration to improve as per epileptologist- Stopped hospital Seroquel --continue Celexa  10 Continue gabapentin  100 tid Vimpat  100 bd Keppra  750 bd phenytoin  75 mg twice daily and then 100 at bedtime   Low-grade temp, leukocytosis--Repeat CXR 4/23 only atelectasis Relative risk for aspiration --possible silent low-grade temps at times with mild white count elevation-see below discussion  Dysphagia secondary to encephalopathy and seizures Prior admission 11/2022 severe mid esophageal dysmotility-supposed to be on dysphagia 1 diet-was eating regular food at facility Core track placed 4/14 continues feeds Ensure 237 twice daily, Osmolite 50 cc/H-added free water  200  every 6 as risk for volume depletion cleared for dysphagia 1 but extremely high risk for pocketing and aspiration-nursing has tried to feed on 4/27 and retains residual food in mouth and not really swallowing- I have kept n.p.o. other than for tube feeds Continue Flexi-Seal although less stool than it  AKI secondary to lnsensible losses ARB Start free water  200 every 6 -continue saline 40 cc/H until we consolidate goals of care I have discontinued irbesartan  150--Stable at this time  Acute on chronic respiratory failure with COPD Previously on oxygen  currently on roommale Continue albuterol  2.5 Q3 as needed Brovana  15 twice daily Pulmicort  0.5 twice daily Yupelri  175 daily  HTN Continue amlodipine  10 , atorvastatin , PRNs in place hydralazine  metoprolol   Goals of care-palliative has been engaged -d/w daughter on phone-4/25-prior patient was coherent and had no issues--some confusion at times.  She knew who people were and and patient was coherent- Called back daughter [name] on 4/27-informed pocketing multiple feeds not really waking up and although not having seizures she is not completely coherent and quite frail I have asked palliative care to weigh back in regarding end-of-life discussions I think she is hospice eligible  DVT prophylaxis: Heparin   Status is: Inpatient Remains inpatient appropriate because:   Requires further management workup  Subjective:  Incoherent at times not really able to clearly tell me where she is Nursing note documents that she is pocketing significantly No fever although low-grade temp 100 yesterday afternoon no chills no nausea  Objective + exam Vitals:   03/24/24 0411 03/24/24 0754 03/24/24 0840 03/24/24 1158  BP:  (!) 117/57    Pulse:  87    Resp:  (!) 22    Temp:  98.3 F (36.8 C)  (!) 96.9 F (36.1 C)  TempSrc:  Axillary  Oral  SpO2:  96% 96%  Weight: 41.9 kg     Height:       Filed Weights   03/20/24 0318 03/23/24 0703 03/24/24  0411  Weight: 40.8 kg 41.9 kg 41.9 kg    Examination:  EOMI NCAT frail black female S1-S2 no murmur no rub no gallop continue something Lateral lung fields are clear-she refuses to sit up No lower extremity edema ROM intact Power 5/5 no focal deficit   Data Reviewed: reviewed   CBC    Component Value Date/Time   WBC 10.7 (H) 03/24/2024 0454   RBC 3.30 (L) 03/24/2024 0454   HGB 9.5 (L) 03/24/2024 0454   HCT 31.3 (L) 03/24/2024 0454   PLT 408 (H) 03/24/2024 0454   MCV 94.8 03/24/2024 0454   MCH 28.8 03/24/2024 0454   MCHC 30.4 03/24/2024 0454   RDW 13.5 03/24/2024 0454   LYMPHSABS 0.7 03/24/2024 0454   MONOABS 1.0 03/24/2024 0454   EOSABS 0.1 03/24/2024 0454   BASOSABS 0.1 03/24/2024 0454      Latest Ref Rng & Units 03/24/2024    4:54 AM 03/23/2024    7:15 AM 03/22/2024    6:04 AM  CMP  Glucose 70 - 99 mg/dL 161  096  045   BUN 8 - 23 mg/dL 32  35  43   Creatinine 0.44 - 1.00 mg/dL 4.09  8.11  9.14   Sodium 135 - 145 mmol/L 139  139  142   Potassium 3.5 - 5.1 mmol/L 4.3  4.7  4.7   Chloride 98 - 111 mmol/L 100  100  103   CO2 22 - 32 mmol/L 30  31  28    Calcium  8.9 - 10.3 mg/dL 9.7  78.2  9.9     Scheduled Meds:  amLODipine   10 mg Per Tube Daily   arformoterol   15 mcg Nebulization BID   aspirin   81 mg Per Tube Daily   atorvastatin   40 mg Per Tube QHS   budesonide  (PULMICORT ) nebulizer solution  0.5 mg Nebulization BID   citalopram   10 mg Per Tube Daily   diphenoxylate -atropine   1 tablet Per Tube QID   feeding supplement  237 mL Per Tube BID BM   free water   200 mL Per Tube Q6H   gabapentin   100 mg Per Tube TID   heparin   5,000 Units Subcutaneous Q8H   lacosamide   100 mg Per Tube BID   levETIRAcetam   750 mg Per Tube BID   multivitamin with minerals  1 tablet Per Tube Daily   nutrition supplement (JUVEN)  1 packet Per Tube BID BM   mouth rinse  15 mL Mouth Rinse 4 times per day   phenytoin  (DILANTIN ) IV  75 mg Intravenous BID   And   phenytoin  (DILANTIN )  IV  100 mg Intravenous QHS   polyvinyl alcohol   1 drop Right Eye TID   revefenacin   175 mcg Nebulization Daily   Continuous Infusions:  sodium chloride  Stopped (03/23/24 1528)   feeding supplement (OSMOLITE 1.2 CAL) 1,000 mL (03/24/24 0003)    Time 28  [name], MD  [facility]

## 2024-03-25 ENCOUNTER — Inpatient Hospital Stay (HOSPITAL_COMMUNITY)

## 2024-03-25 DIAGNOSIS — Z7189 Other specified counseling: Secondary | ICD-10-CM | POA: Diagnosis not present

## 2024-03-25 DIAGNOSIS — G40901 Epilepsy, unspecified, not intractable, with status epilepticus: Secondary | ICD-10-CM | POA: Diagnosis not present

## 2024-03-25 DIAGNOSIS — Z515 Encounter for palliative care: Secondary | ICD-10-CM | POA: Diagnosis not present

## 2024-03-25 LAB — CBC WITH DIFFERENTIAL/PLATELET
Abs Immature Granulocytes: 0.05 10*3/uL (ref 0.00–0.07)
Basophils Absolute: 0.1 10*3/uL (ref 0.0–0.1)
Basophils Relative: 1 %
Eosinophils Absolute: 0.2 10*3/uL (ref 0.0–0.5)
Eosinophils Relative: 2 %
HCT: 31.5 % — ABNORMAL LOW (ref 36.0–46.0)
Hemoglobin: 9.7 g/dL — ABNORMAL LOW (ref 12.0–15.0)
Immature Granulocytes: 1 %
Lymphocytes Relative: 7 %
Lymphs Abs: 0.7 10*3/uL (ref 0.7–4.0)
MCH: 29 pg (ref 26.0–34.0)
MCHC: 30.8 g/dL (ref 30.0–36.0)
MCV: 94.3 fL (ref 80.0–100.0)
Monocytes Absolute: 0.8 10*3/uL (ref 0.1–1.0)
Monocytes Relative: 7 %
Neutro Abs: 9.2 10*3/uL — ABNORMAL HIGH (ref 1.7–7.7)
Neutrophils Relative %: 82 %
Platelets: 399 10*3/uL (ref 150–400)
RBC: 3.34 MIL/uL — ABNORMAL LOW (ref 3.87–5.11)
RDW: 13.3 % (ref 11.5–15.5)
WBC: 11 10*3/uL — ABNORMAL HIGH (ref 4.0–10.5)
nRBC: 0 % (ref 0.0–0.2)

## 2024-03-25 LAB — BASIC METABOLIC PANEL WITH GFR
Anion gap: 8 (ref 5–15)
BUN: 25 mg/dL — ABNORMAL HIGH (ref 8–23)
CO2: 27 mmol/L (ref 22–32)
Calcium: 9.6 mg/dL (ref 8.9–10.3)
Chloride: 99 mmol/L (ref 98–111)
Creatinine, Ser: 0.34 mg/dL — ABNORMAL LOW (ref 0.44–1.00)
GFR, Estimated: >60 mL/min (ref >60)
Glucose, Bld: 110 mg/dL — ABNORMAL HIGH (ref 70–99)
Potassium: 4.6 mmol/L (ref 3.5–5.1)
Sodium: 134 mmol/L — ABNORMAL LOW (ref 135–145)

## 2024-03-25 LAB — GLUCOSE, CAPILLARY
Glucose-Capillary: 133 mg/dL — ABNORMAL HIGH (ref 70–99)
Glucose-Capillary: 117 mg/dL — ABNORMAL HIGH (ref 70–99)
Glucose-Capillary: 130 mg/dL — ABNORMAL HIGH (ref 70–99)
Glucose-Capillary: 133 mg/dL — ABNORMAL HIGH (ref 70–99)
Glucose-Capillary: 119 mg/dL — ABNORMAL HIGH (ref 70–99)
Glucose-Capillary: 113 mg/dL — ABNORMAL HIGH (ref 70–99)

## 2024-03-25 MED ORDER — PHENYTOIN 50 MG PO CHEW
75.0000 mg | CHEWABLE_TABLET | Freq: Two times a day (BID) | ORAL | Status: DC
  Administered 2024-03-25 – 2024-03-26 (×3): 75 mg via ORAL
  Filled 2024-03-25 (×4): qty 1.5

## 2024-03-25 MED ORDER — PHENYTOIN 50 MG PO CHEW
100.0000 mg | CHEWABLE_TABLET | Freq: Every day | ORAL | Status: DC
  Administered 2024-03-25: 100 mg via ORAL
  Filled 2024-03-25 (×3): qty 2

## 2024-03-25 NOTE — TOC Progression Note (Signed)
 Transition of Care [facility]) - Progression Note    Patient Details  Name: Chloe Wilcox MRN: [id number] Date of Birth: [birth year]  Transition of Care [facility] CM/SW Contact  Valery Gaucher, [place] Phone Number: 03/25/2024, 12:57 PM  Clinical Narrative:     Patient w/cortrak. TOC continues to follow  Liddie Reel, MSW, LCSW Clinical Social Worker    Expected Discharge Plan: Skilled Nursing Facility Barriers to Discharge: Continued Medical Work up  Expected Discharge Plan and Services In-house Referral: Clinical Social Work   Post Acute Care Choice: Skilled Nursing Facility Living arrangements for the past 2 months: Skilled Nursing Facility                                       Social Determinants of Health (SDOH) Interventions SDOH Screenings   Food Insecurity: Patient Unable To Answer (03/07/2024)  Housing: Patient Unable To Answer (03/07/2024)  Transportation Needs: Patient Unable To Answer (03/07/2024)  Utilities: Patient Unable To Answer (03/07/2024)  Social Connections: Patient Unable To Answer (03/07/2024)  Tobacco Use: Medium Risk (03/06/2024)    Readmission Risk Interventions     No data to display

## 2024-03-25 NOTE — Progress Notes (Signed)
 Daily Progress Note   Patient Name: Chloe Wilcox       Date: 03/25/2024 DOB: 1935/05/22  Age: 89 y.o. MRN#: [id number] Attending Physician: Samtani, Jai-Gurmukh, MD Primary Care Physician: Roselind Congo, MD Admit Date: 03/06/2024  Reason for Consultation/Follow-up: Establishing goals of care  Subjective: Medical records reviewed including progress notes, labs, imaging. Patient assessed at the bedside.  She is easily aroused, does not give clear answer when asked if she is in pain or distress.  No family present during the visit.  Called patient's daughter Chloe Wilcox and granddaughter Chloe Wilcox for ongoing palliative support and GOC discussions. Reviewed interval history since our last conversation with emphasis on ongoing dysphagia and nutritional concerns. Reviewed the differences between palliative care and hospice at family's request - they are clear that they are "not giving up" on patient, but they are open to an extra layer of support from PMT to discuss next steps, best case and worst-case scenarios.  They would also be open to considering outpatient palliative care follow-up at SNF for continued conversations.    They share that they have been made to feel she is going to die at any moment and would like further clarity on this.  Educated was provided on risks and benefits of PEG placement, as well as the potential for further decline despite aggressive artificial nutrition.  We discussed the long duration it would take to see meaningful improvement after status epilepticus and potential for complications throughout this time.  Family understands this, and while hopeful for improvement, if she appears to be worsening and unable to enjoy a meaningful quality of life then they would consider hospice at a later time.  They are appreciative of the  conversation and would like to inform PMT of a date/time to have family meeting in review with additional family members.  Questions and concerns addressed. PMT will continue to support holistically.   Length of Stay: 19   Physical Exam Vitals and nursing note reviewed.  Constitutional:      General: She is not in acute distress.    Appearance: She is ill-appearing.     Comments: Cortrak in place  Cardiovascular:     Rate and Rhythm: Normal rate.  Pulmonary:     Effort: Pulmonary effort is normal.  Neurological:     Mental Status: She is alert and easily aroused.  Psychiatric:        Behavior: Behavior normal.        Cognition and Memory: Cognition is impaired.            Vital Signs: BP 136/65 (BP Location: Left  Arm)   Pulse 81   Temp 97.8 F (36.6 C) (Oral)   Resp (!) 21   Ht 5' (1.524 m)   Wt 41.9 kg   SpO2 98%   BMI 18.04 kg/m  SpO2: SpO2: 98 % O2 Device: O2 Device: Room Air O2 Flow Rate: O2 Flow Rate (L/min): 2 L/min      Palliative Assessment/Data: 30%   Palliative Care Assessment & Plan   Patient Profile: 89 y.o. female  with past medical history of epilepsy, cognitive deficits, failure to thrive admitted on 03/06/2024 with twitching and right-sided gaze, less responsive.    EMS was called and administered IM Versed  without improvement before establishing IV access and administering an additional Versed  dose IV. There was roughly 45 minutes reported before seizures resolved. She was brought to [facility] ED where she required intubation for airway protection.  Extubated on 4/13 and currently still has ongoing seizure activity.   PMT has been consulted to assist with goals of care conversation in light of failure to thrive and chronic comorbidities.  Assessment: Goals of care conversation Acute encephalopathy secondary to status epilepticus  Seizure disorder with breakthrough seizures/status epilepticus Acute on chronic respiratory failure, improved to RA  AKI LLL  Pneumonia  Recommendations/Plan: Continue DNR Continue full scope treatment. Patient's family would like to give more time for outcomes and hope for further improvement given slow nature of recovery 2/2 seizures Would potentially pursue PEG for nutrition if indicated to allow more time for improvement Family would consider de-escalation of aggressive care if she declined significantly, if she desired to stop aggressive measures, or if she had complications that worsened her quality of life Ongoing GOC discussions pending clinical course PMT will continue to follow    Prognosis:  Guarded  Discharge Planning: To Be Determined  Care plan was discussed with Patient, patient's daughter and granddaughter, MD          Angeline Kemps, PA-C  Palliative Medicine Team Team phone # [phone]  Thank you for allowing the Palliative Medicine Team to assist in the care of this patient. Please utilize secure chat with additional questions, if there is no response within 30 minutes please call the above phone number.  Palliative Medicine Team providers are available by phone from 7am to 7pm daily and can be reached through the team cell phone.  Should this patient require assistance outside of these hours, please call the patient's attending physician.

## 2024-03-25 NOTE — Progress Notes (Signed)
 Patient left unit for Xray at this time. CCMD aware.

## 2024-03-25 NOTE — Consult Note (Signed)
 Chloe Wilcox May 18, 1935  [id number].    Requesting MD: Dr. Abbie Hock Chief Complaint/Reason for Consult: Dysphagia  HPI: Chloe Wilcox is a 89 y.o. with a history of cognitive deficits, FTT, seizures and who was residing in a nursing home and presented with status epileptic seizures and now has encephalopathy.  Patient found to have dysphagia during admission.  She originally passed for D1 diet but per my discussion with the hospitalist today she has been made n.p.o. as there is concern for her aspirating.  IR was consulted but did not feel there was a window.  We were consulted for surgical placement.  There is no family at bedside.  I reviewed her chart and discussed with [facility] and her RN.  No prior abdominal surgeries on file.  She is not any anticoagulation at baseline. She is currently tolerating tf's without vomiting and having bm's per report.   ROS: ROS As above, see hpi  Family History  Family history unknown: Yes    Past Medical History:  Diagnosis Date   COPD (chronic obstructive pulmonary disease) (HCC)    Depression    Hypertension     Past Surgical History:  Procedure Laterality Date   INTRAMEDULLARY (IM) NAIL INTERTROCHANTERIC Right 07/10/2023   Procedure: INTRAMEDULLARY (IM) NAIL INTERTROCHANTERIC;  Surgeon: Sammye Cristal, MD;  Location: [facility];  Service: Orthopedics;  Laterality: Right;   VAGINAL HYSTERECTOMY      Social History:  reports that she quit smoking about 40 years ago. Her smoking use included cigarettes. She started smoking about 73 years ago. She has a 33 pack-year smoking history. She has never used smokeless tobacco. She reports that she does not drink alcohol  and does not use drugs.  Allergies: No Known Allergies  Medications Prior to Admission  Medication Sig Dispense Refill   acetaminophen  (TYLENOL ) 325 MG tablet Take 650 mg by mouth in the morning, at noon, and at bedtime.     albuterol  (PROVENTIL ) (2.5 MG/3ML) 0.083% nebulizer  solution Take 3 mLs (2.5 mg total) by nebulization every 6 (six) hours as needed for wheezing or shortness of breath. 150 mL 5   aspirin  EC 81 MG tablet Take 81 mg by mouth daily. Swallow whole.     Budeson-Glycopyrrol-Formoterol  (BREZTRI  AEROSPHERE) 160-9-4.8 MCG/ACT AERO Inhale 2 puffs into the lungs in the morning and at bedtime. 10.7 g 5   Cholecalciferol 125 MCG (5000 UT) TABS Take 5,000 Units by mouth daily.     citalopram  (CELEXA ) 10 MG tablet Take 1 tablet (10 mg total) by mouth daily. 12 tablet 0   docusate sodium  (COLACE) 100 MG capsule Take 1 capsule (100 mg total) by mouth 2 (two) times daily.     feeding supplement (ENSURE ENLIVE / ENSURE PLUS) LIQD Take 237 mLs by mouth 3 (three) times daily between meals. (Patient taking differently: Take 120 mLs by mouth in the morning and at bedtime. Medpass - Magic Cup)     ipratropium-albuterol  (DUONEB) 0.5-2.5 (3) MG/3ML SOLN Inhale 3 mLs into the lungs in the morning and at bedtime.     olmesartan  (BENICAR ) 20 MG tablet Take 1 tablet (20 mg total) by mouth daily.     traMADol  (ULTRAM ) 50 MG tablet Take 1 tablet (50 mg total) by mouth every 6 (six) hours as needed for severe pain (pain score 7-10). 5 tablet 0   albuterol  (VENTOLIN  HFA) 108 (90 Base) MCG/ACT inhaler Inhale 2 puffs into the lungs every 6 (six) hours as needed for wheezing  or shortness of breath. (Patient not taking: Reported on 10/02/2023) 18 g 2   atorvastatin  (LIPITOR) 40 MG tablet Take 40 mg by mouth at bedtime. (Patient not taking: Reported on 03/06/2024)     levETIRAcetam  (KEPPRA ) 500 MG tablet Take 1 tablet (500 mg total) by mouth 2 (two) times daily. (Patient not taking: Reported on 03/06/2024)       Physical Exam: Blood pressure (!) 120/56, pulse 77, temperature 99 F (37.2 C), temperature source Oral, resp. rate 19, height 5' (1.524 m), weight 41.9 kg, SpO2 100%.  General: pleasant, elderly female in NAD HEENT: head is normocephalic, atraumatic.   Heart: regular rate on  monitor Lungs: Respiratory effort nonlabored Abd:  Soft, ND, NT, +BS.  MS: no BUE or BLE edema Skin: warm and dry  Psych: A&O to self   Results for orders placed or performed during the hospital encounter of 03/06/24 (from the past 48 hours)  Glucose, capillary     Status: Abnormal   Collection Time: 03/23/24  7:51 PM  Result Value Ref Range   Glucose-Capillary 134 (H) 70 - 99 mg/dL    Comment: Glucose reference range applies only to samples taken after fasting for at least 8 hours.  Glucose, capillary     Status: Abnormal   Collection Time: 03/23/24 11:33 PM  Result Value Ref Range   Glucose-Capillary 120 (H) 70 - 99 mg/dL    Comment: Glucose reference range applies only to samples taken after fasting for at least 8 hours.  Glucose, capillary     Status: Abnormal   Collection Time: 03/24/24  3:20 AM  Result Value Ref Range   Glucose-Capillary 136 (H) 70 - 99 mg/dL    Comment: Glucose reference range applies only to samples taken after fasting for at least 8 hours.  Basic metabolic panel with GFR     Status: Abnormal   Collection Time: 03/24/24  4:54 AM  Result Value Ref Range   Sodium 139 135 - 145 mmol/L   Potassium 4.3 3.5 - 5.1 mmol/L   Chloride 100 98 - 111 mmol/L   CO2 30 22 - 32 mmol/L   Glucose, Bld 132 (H) 70 - 99 mg/dL    Comment: Glucose reference range applies only to samples taken after fasting for at least 8 hours.   BUN 32 (H) 8 - 23 mg/dL   Creatinine, Ser 0.98 (L) 0.44 - 1.00 mg/dL   Calcium  9.7 8.9 - 10.3 mg/dL   GFR, Estimated >11 >91 mL/min    Comment: (NOTE) Calculated using the CKD-EPI Creatinine Equation (2021)    Anion gap 9 5 - 15    Comment: Performed at [facility], [address] [place], [place] [address]  CBC with Differential/Platelet     Status: Abnormal   Collection Time: 03/24/24  4:54 AM  Result Value Ref Range   WBC 10.7 (H) 4.0 - 10.5 K/uL   RBC 3.30 (L) 3.87 - 5.11 MIL/uL   Hemoglobin 9.5 (L) 12.0 - 15.0 g/dL   HCT 56.2 (L)  13.0 - 46.0 %   MCV 94.8 80.0 - 100.0 fL   MCH 28.8 26.0 - 34.0 pg   MCHC 30.4 30.0 - 36.0 g/dL   RDW 86.5 78.4 - 69.6 %   Platelets 408 (H) 150 - 400 K/uL   nRBC 0.0 0.0 - 0.2 %   Neutrophils Relative % 81 %   Neutro Abs 8.8 (H) 1.7 - 7.7 K/uL   Lymphocytes Relative 7 %  Lymphs Abs 0.7 0.7 - 4.0 K/uL   Monocytes Relative 9 %   Monocytes Absolute 1.0 0.1 - 1.0 K/uL   Eosinophils Relative 1 %   Eosinophils Absolute 0.1 0.0 - 0.5 K/uL   Basophils Relative 1 %   Basophils Absolute 0.1 0.0 - 0.1 K/uL   Immature Granulocytes 1 %   Abs Immature Granulocytes 0.07 0.00 - 0.07 K/uL    Comment: Performed at [facility], [address] [place], [place] [address]  Glucose, capillary     Status: Abnormal   Collection Time: 03/24/24  7:52 AM  Result Value Ref Range   Glucose-Capillary 122 (H) 70 - 99 mg/dL    Comment: Glucose reference range applies only to samples taken after fasting for at least 8 hours.  Glucose, capillary     Status: Abnormal   Collection Time: 03/24/24 11:55 AM  Result Value Ref Range   Glucose-Capillary 125 (H) 70 - 99 mg/dL    Comment: Glucose reference range applies only to samples taken after fasting for at least 8 hours.  Glucose, capillary     Status: Abnormal   Collection Time: 03/24/24  3:58 PM  Result Value Ref Range   Glucose-Capillary 118 (H) 70 - 99 mg/dL    Comment: Glucose reference range applies only to samples taken after fasting for at least 8 hours.  Glucose, capillary     Status: Abnormal   Collection Time: 03/24/24  7:57 PM  Result Value Ref Range   Glucose-Capillary 136 (H) 70 - 99 mg/dL    Comment: Glucose reference range applies only to samples taken after fasting for at least 8 hours.  Glucose, capillary     Status: Abnormal   Collection Time: 03/24/24 11:29 PM  Result Value Ref Range   Glucose-Capillary 141 (H) 70 - 99 mg/dL    Comment: Glucose reference range applies only to samples taken after fasting for at least 8 hours.   Glucose, capillary     Status: Abnormal   Collection Time: 03/25/24  3:45 AM  Result Value Ref Range   Glucose-Capillary 133 (H) 70 - 99 mg/dL    Comment: Glucose reference range applies only to samples taken after fasting for at least 8 hours.  Glucose, capillary     Status: Abnormal   Collection Time: 03/25/24  8:21 AM  Result Value Ref Range   Glucose-Capillary 117 (H) 70 - 99 mg/dL    Comment: Glucose reference range applies only to samples taken after fasting for at least 8 hours.  Basic metabolic panel with GFR     Status: Abnormal   Collection Time: 03/25/24  9:12 AM  Result Value Ref Range   Sodium 134 (L) 135 - 145 mmol/L   Potassium 4.6 3.5 - 5.1 mmol/L   Chloride 99 98 - 111 mmol/L   CO2 27 22 - 32 mmol/L   Glucose, Bld 110 (H) 70 - 99 mg/dL    Comment: Glucose reference range applies only to samples taken after fasting for at least 8 hours.   BUN 25 (H) 8 - 23 mg/dL   Creatinine, Ser 6.04 (L) 0.44 - 1.00 mg/dL   Calcium  9.6 8.9 - 10.3 mg/dL   GFR, Estimated >54 >09 mL/min    Comment: (NOTE) Calculated using the CKD-EPI Creatinine Equation (2021)    Anion gap 8 5 - 15    Comment: Performed at [facility], [address], [place], [place] [address]  CBC with Differential/Platelet  Status: Abnormal   Collection Time: 03/25/24  9:12 AM  Result Value Ref Range   WBC 11.0 (H) 4.0 - 10.5 K/uL   RBC 3.34 (L) 3.87 - 5.11 MIL/uL   Hemoglobin 9.7 (L) 12.0 - 15.0 g/dL   HCT 60.6 (L) 30.1 - 60.1 %   MCV 94.3 80.0 - 100.0 fL   MCH 29.0 26.0 - 34.0 pg   MCHC 30.8 30.0 - 36.0 g/dL   RDW 09.3 23.5 - 57.3 %   Platelets 399 150 - 400 K/uL   nRBC 0.0 0.0 - 0.2 %   Neutrophils Relative % 82 %   Neutro Abs 9.2 (H) 1.7 - 7.7 K/uL   Lymphocytes Relative 7 %   Lymphs Abs 0.7 0.7 - 4.0 K/uL   Monocytes Relative 7 %   Monocytes Absolute 0.8 0.1 - 1.0 K/uL   Eosinophils Relative 2 %   Eosinophils Absolute 0.2 0.0 - 0.5 K/uL   Basophils Relative 1 %   Basophils Absolute  0.1 0.0 - 0.1 K/uL   Immature Granulocytes 1 %   Abs Immature Granulocytes 0.05 0.00 - 0.07 K/uL    Comment: Performed at [facility], [address] [place], [place] [address]  Glucose, capillary     Status: Abnormal   Collection Time: 03/25/24 11:24 AM  Result Value Ref Range   Glucose-Capillary 130 (H) 70 - 99 mg/dL    Comment: Glucose reference range applies only to samples taken after fasting for at least 8 hours.  Glucose, capillary     Status: Abnormal   Collection Time: 03/25/24  3:23 PM  Result Value Ref Range   Glucose-Capillary 133 (H) 70 - 99 mg/dL    Comment: Glucose reference range applies only to samples taken after fasting for at least 8 hours.   CT ABDOMEN PELVIS WO CONTRAST Result Date: 03/25/2024 CLINICAL DATA:  Seizure, dysphagia and evaluation for possible percutaneous gastrostomy tube placement for nutrition. EXAM: CT ABDOMEN AND PELVIS WITHOUT CONTRAST TECHNIQUE: Multidetector CT imaging of the abdomen and pelvis was performed following the standard protocol without IV contrast. RADIATION DOSE REDUCTION: This exam was performed according to the departmental dose-optimization program which includes automated exposure control, adjustment of the mA and/or kV according to patient size and/or use of iterative reconstruction technique. COMPARISON:  Prior CT of the abdomen on 08/03/2017 FINDINGS: Lower chest: Bibasilar atelectasis, right greater than left. No hiatal hernia. Hepatobiliary: No focal liver abnormality is seen. No gallstones, gallbladder wall thickening, or biliary dilatation. Pancreas: Unremarkable. No pancreatic ductal dilatation or surrounding inflammatory changes. Spleen: Normal in size without focal abnormality. Adrenals/Urinary Tract: No adrenal masses or hydronephrosis. Several left-sided renal cysts present with the largest measuring 6.5 cm and demonstrating simple fluid densely internally. The bladder is unremarkable. Stomach/Bowel: Feeding tube enters  the stomach and terminates in the distal stomach. The stomach is very high in position within the left upper quadrant and is completely posterior to the splenic flexure of the colon as well as the distal transverse colon and tip of the left lobe of the liver with no adequate percutaneous window between the abdominal wall for safe gastrostomy tube placement. There is no evidence of significant ileus or bowel obstruction. No free intraperitoneal air identified. No evidence of acute inflammatory process involving bowel. Rectal tube in the rectum. Vascular/Lymphatic: Atherosclerosis of the abdominal aorta without aneurysm. No enlarged lymph nodes identified. Reproductive: Heavily calcified enlarged uterus likely relates to degenerated fibroid tissue. Other: No hernias, ascites or focal abscess identified.  Musculoskeletal: Leftward convex scoliosis of the lumbar spine. IMPRESSION: 1. The stomach is very high in position within the left upper quadrant and is completely posterior to the splenic flexure of the colon as well as the distal transverse colon and tip of the left lobe of the liver with no adequate percutaneous window between the abdominal wall for safe gastrostomy tube placement. The patient is not a candidate for percutaneous gastrostomy tube placement. 2. Bibasilar atelectasis, right greater than left. 3. Atherosclerosis of the abdominal aorta without aneurysm. 4. Heavily calcified enlarged uterus likely relates to degenerated fibroid tissue. 5. Leftward convex scoliosis of the lumbar spine. 6. Left-sided renal cysts with the largest measuring 6.5 cm and demonstrating simple fluid densely internally. Electronically Signed   By: Erica Hau M.D.   On: 03/25/2024 13:30    Anti-infectives (From admission, onward)    Start     Dose/Rate Route Frequency Ordered Stop   03/12/24 1800  cefTRIAXone  (ROCEPHIN ) 2 g in sodium chloride  0.9 % 100 mL IVPB        2 g 200 mL/hr over 30 Minutes Intravenous Every 24  hours 03/12/24 1316 03/15/24 1821   03/10/24 0900  vancomycin  (VANCOREADY) IVPB 500 mg/100 mL  Status:  Discontinued        500 mg 100 mL/hr over 60 Minutes Intravenous Every 24 hours 03/09/24 1252 03/12/24 1316   03/09/24 1000  piperacillin -tazobactam (ZOSYN ) IVPB 3.375 g  Status:  Discontinued        3.375 g 12.5 mL/hr over 240 Minutes Intravenous Every 8 hours 03/09/24 0903 03/12/24 1316   03/09/24 1000  vancomycin  (VANCOCIN ) IVPB 1000 mg/200 mL premix        1,000 mg 200 mL/hr over 60 Minutes Intravenous  Once 03/09/24 0903 03/09/24 1035   03/09/24 0902  vancomycin  variable dose per unstable renal function (pharmacist dosing)  Status:  Discontinued         Does not apply See admin instructions 03/09/24 0903 03/09/24 1252       Assessment/Plan Dysphagia   This is a 89 year old female with a history of cognitive deficits, FTT, seizures and who was residing in a nursing home and presented with status epileptic seizures and now has encephalopathy.  Patient found to have dysphagia during admission.  She originally passed for D1 diet but per my discussion with the hospitalist today she has been made n.p.o. as there is concern for her aspirating.  IR was consulted but did not feel there was a window.  We were consulted for surgical placement.  I have reviewed the imaging with our team.  No window for PEG.  We will discuss with family about option for lap assisted G-tube placement however I think we should continue GOC conversations with palliative as well.   I reviewed nursing notes, hospitalist notes, last 24 h vitals and pain scores, last 48 h intake and output, last 24 h labs and trends, and last 24 h imaging results.  Delton Filbert, [facility] 03/25/2024, 4:18 PM Please see Amion for pager number during day hours 7:00am-4:30pm

## 2024-03-25 NOTE — Progress Notes (Signed)
    Referring Physician(s): Dr. Haywood Lisle  Supervising Physician: Marland Silvas  Patient Status:  [facility] - In-pt  Request received for IR consult for gastrostomy tube placement. CT ordered and read by IR Dr. Nereida Banning:  "The stomach is very high in position within the left upper quadrant and is completely posterior to the splenic flexure of the colon as well as the distal transverse colon and tip of the left lobe of the liver with no adequate percutaneous window between the abdominal wall for safe gastrostomy tube placement. The patient is not a candidate for percutaneous gastrostomy tube placement."  Communicated with Dr. Samtani that this patient is not a candidate for percutaneous gastrostomy tube with IR at this time, recommended surgical consult.   Electronically Signed: Terressa Fess, NP 03/25/2024, 3:03 PM

## 2024-03-25 NOTE — Progress Notes (Addendum)
 [facility] ROUNDING NOTE Chloe Wilcox ACZ:[id number]  DOB: Apr 28, 1935  DOA: 03/06/2024  PCP: Roselind Congo, MD  03/25/2024,11:00 AM  LOS: 19 days    Code Status: DNR   [facility]:   [name] current Dispo: likely SNF    89 year old female-at baseline and needs [name] to help remove Prior smoker chronic oxygen  2/2 gold 2 COPD  HTN depression iron deficiency anemia Previous admission right femoral fracture status post IM nail Hospitalized 11/4-11/15 hypertensive emergency?  Seizure-like activity secondary to hemosiderin deposition Keppra  started she was delirious-- had demand ischemia-- dysphagia with severe mid esophageal dysmotility recommended dysphagia diet at discharge 03/06/24 presented from the ED seizure twitching right-sided gaze response to Versed  intubated loaded with Keppra  start propofol  admit by ICU-WBC 7 hemoglobin 10 platelet 265--sodium 144 potassium 4.1 BUN/creatinine 17/0.4 LFTs normal CXR atelectasis-CT head no acute intracranial abnormality Epileptologist consulted recommended phenytoin  Vimpat  Keppra  gabapentin  and outpatient follow-up and signed off 4/17 4/17 --transferred to hospitalist  Plan  Toxic metabolic encephalopathy secondary to status epilepticus Underlying mild to moderate dementia PTA Delirium/confusion worse likely 2/2 seizures with low reserve/ long duration to improve as per epileptologist Stopped hospital Seroquel --continue Celexa  10 Continue  Vimpat  100 bd Keppra  750 bd --switch to oral phenytoin  75 mg twice daily and then 100 at bedtime   Low-grade temp, leukocytosis--Repeat CXR 4/23 only atelectasis Relative risk for aspiration --possible silent low-grade temps at times with mild white count elevation-see below discussion  Dysphagia secondary to encephalopathy and seizures Prior admission 11/2022 severe mid esophageal dysmotility-supposed to be on dysphagia 1 diet-was eating regular food at facility Core track placed 4/14 continues feeds Ensure 237 twice  daily, Osmolite 50 cc/H-added free water  200 every 6 as risk for volume depletion cleared for dysphagia 1 but extremely high risk for pocketing and aspiration-pocketing 4/27--n.p.o. status now Continue Flexi-Seal although less stool than it Family interested in PEG tube placement understanding implications that this would not prevent aspiration or reflux into the GI tract  AKI secondary to lnsensible losses ARB Start free water  200 every 6 -continue saline 40 cc/H  I have discontinued irbesartan  150--Stable at this time  Acute on chronic respiratory failure with COPD Previously on oxygen  currently on roommale Continue albuterol  2.5 Q3 as needed Brovana  15 twice daily Pulmicort  0.5 twice daily Yupelri  175 daily  HTN Continue amlodipine  10 , atorvastatin , PRNs in place hydralazine  metoprolol   Goals of care-palliative has been engaged -d/w daughter on phone-4/25, 4/27--palliative care has seen and patient and family want PEG tube placed outpatient palliative we will order the same-she has poor overall prognosis and this has been mentioned to family  DVT prophylaxis: Heparin   Status is: Inpatient Remains inpatient appropriate because:   Requires further management workup  Subjective:  "Its 1936"  her answer to most questions Does not consistently follow commands but is more awake  Objective + exam Vitals:   03/24/24 1958 03/24/24 2331 03/25/24 0342 03/25/24 0730  BP: (!) 119/53 (!) 110/56 (!) 125/56 136/65  Pulse: 70 72 72 81  Resp: 12 (!) 21 15 (!) 21  Temp: 98 F (36.7 C) 97.8 F (36.6 C) 97.8 F (36.6 C) 97.8 F (36.6 C)  TempSrc: Axillary Oral Axillary Oral  SpO2: 100% 99% 100% 98%  Weight:      Height:       Filed Weights   03/20/24 0318 03/23/24 0703 03/24/24 0411  Weight: 40.8 kg 41.9 kg 41.9 kg    Examination:  Core track in place pleasant frail  no distress EOMI NCAT no focal deficit right eye remains somewhat close S1-S2 no murmur-on monitors sinus  rhythm Abdo and soft no rebound No lower extremity edema Neuro gross motor grossly unchanged from prior   Data Reviewed: reviewed   CBC    Component Value Date/Time   WBC 11.0 (H) 03/25/2024 0912   RBC 3.34 (L) 03/25/2024 0912   HGB 9.7 (L) 03/25/2024 0912   HCT 31.5 (L) 03/25/2024 0912   PLT 399 03/25/2024 0912   MCV 94.3 03/25/2024 0912   MCH 29.0 03/25/2024 0912   MCHC 30.8 03/25/2024 0912   RDW 13.3 03/25/2024 0912   LYMPHSABS 0.7 03/25/2024 0912   MONOABS 0.8 03/25/2024 0912   EOSABS 0.2 03/25/2024 0912   BASOSABS 0.1 03/25/2024 0912      Latest Ref Rng & Units 03/25/2024    9:12 AM 03/24/2024    4:54 AM 03/23/2024    7:15 AM  CMP  Glucose 70 - 99 mg/dL 086  578  469   BUN 8 - 23 mg/dL 25  32  35   Creatinine 0.44 - 1.00 mg/dL 6.29  5.28  4.13   Sodium 135 - 145 mmol/L 134  139  139   Potassium 3.5 - 5.1 mmol/L 4.6  4.3  4.7   Chloride 98 - 111 mmol/L 99  100  100   CO2 22 - 32 mmol/L 27  30  31    Calcium  8.9 - 10.3 mg/dL 9.6  9.7  24.4     Scheduled Meds:  amLODipine   10 mg Per Tube Daily   arformoterol   15 mcg Nebulization BID   aspirin   81 mg Per Tube Daily   atorvastatin   40 mg Per Tube QHS   budesonide  (PULMICORT ) nebulizer solution  0.5 mg Nebulization BID   citalopram   10 mg Per Tube Daily   diphenoxylate -atropine   1 tablet Per Tube QID   feeding supplement  237 mL Per Tube BID BM   free water   200 mL Per Tube Q6H   gabapentin   100 mg Per Tube TID   heparin   5,000 Units Subcutaneous Q8H   lacosamide   100 mg Per Tube BID   levETIRAcetam   750 mg Per Tube BID   multivitamin with minerals  1 tablet Per Tube Daily   nutrition supplement (JUVEN)  1 packet Per Tube BID BM   mouth rinse  15 mL Mouth Rinse 4 times per day   phenytoin  (DILANTIN ) IV  75 mg Intravenous BID   And   phenytoin  (DILANTIN ) IV  100 mg Intravenous QHS   polyvinyl alcohol   1 drop Right Eye TID   revefenacin   175 mcg Nebulization Daily   Continuous Infusions:  feeding supplement  (OSMOLITE 1.2 CAL) 1,000 mL (03/24/24 2105)    Time 28  [name], MD  [facility]

## 2024-03-26 ENCOUNTER — Inpatient Hospital Stay (HOSPITAL_COMMUNITY)

## 2024-03-26 DIAGNOSIS — G40901 Epilepsy, unspecified, not intractable, with status epilepticus: Secondary | ICD-10-CM | POA: Diagnosis not present

## 2024-03-26 DIAGNOSIS — Z7189 Other specified counseling: Secondary | ICD-10-CM | POA: Diagnosis not present

## 2024-03-26 DIAGNOSIS — Z515 Encounter for palliative care: Secondary | ICD-10-CM | POA: Diagnosis not present

## 2024-03-26 LAB — BASIC METABOLIC PANEL WITH GFR
Anion gap: 9 (ref 5–15)
BUN: 22 mg/dL (ref 8–23)
CO2: 29 mmol/L (ref 22–32)
Calcium: 9.6 mg/dL (ref 8.9–10.3)
Chloride: 98 mmol/L (ref 98–111)
Creatinine, Ser: 0.36 mg/dL — ABNORMAL LOW (ref 0.44–1.00)
GFR, Estimated: >60 mL/min (ref >60)
Glucose, Bld: 103 mg/dL — ABNORMAL HIGH (ref 70–99)
Potassium: 4.6 mmol/L (ref 3.5–5.1)
Sodium: 136 mmol/L (ref 135–145)

## 2024-03-26 LAB — CBC WITH DIFFERENTIAL/PLATELET
Abs Immature Granulocytes: 0.07 10*3/uL (ref 0.00–0.07)
Basophils Absolute: 0 10*3/uL (ref 0.0–0.1)
Basophils Relative: 1 %
Eosinophils Absolute: 0.2 10*3/uL (ref 0.0–0.5)
Eosinophils Relative: 3 %
HCT: 30.6 % — ABNORMAL LOW (ref 36.0–46.0)
Hemoglobin: 9.8 g/dL — ABNORMAL LOW (ref 12.0–15.0)
Immature Granulocytes: 1 %
Lymphocytes Relative: 10 %
Lymphs Abs: 0.8 10*3/uL (ref 0.7–4.0)
MCH: 29.4 pg (ref 26.0–34.0)
MCHC: 32 g/dL (ref 30.0–36.0)
MCV: 91.9 fL (ref 80.0–100.0)
Monocytes Absolute: 0.8 10*3/uL (ref 0.1–1.0)
Monocytes Relative: 9 %
Neutro Abs: 6.4 10*3/uL (ref 1.7–7.7)
Neutrophils Relative %: 76 %
Platelets: 396 10*3/uL (ref 150–400)
RBC: 3.33 MIL/uL — ABNORMAL LOW (ref 3.87–5.11)
RDW: 13.3 % (ref 11.5–15.5)
WBC: 8.4 10*3/uL (ref 4.0–10.5)
nRBC: 0 % (ref 0.0–0.2)

## 2024-03-26 LAB — GLUCOSE, CAPILLARY
Glucose-Capillary: 133 mg/dL — ABNORMAL HIGH (ref 70–99)
Glucose-Capillary: 123 mg/dL — ABNORMAL HIGH (ref 70–99)
Glucose-Capillary: 113 mg/dL — ABNORMAL HIGH (ref 70–99)
Glucose-Capillary: 121 mg/dL — ABNORMAL HIGH (ref 70–99)
Glucose-Capillary: 99 mg/dL (ref 70–99)
Glucose-Capillary: 102 mg/dL — ABNORMAL HIGH (ref 70–99)

## 2024-03-26 MED ORDER — PHENYTOIN 50 MG PO CHEW
75.0000 mg | CHEWABLE_TABLET | Freq: Two times a day (BID) | ORAL | Status: DC
  Administered 2024-03-27 – 2024-03-31 (×10): 75 mg
  Filled 2024-03-26 (×12): qty 1.5

## 2024-03-26 MED ORDER — PHENYTOIN 50 MG PO CHEW
100.0000 mg | CHEWABLE_TABLET | Freq: Every day | ORAL | Status: DC
  Administered 2024-03-26 – 2024-03-31 (×6): 100 mg
  Filled 2024-03-26 (×7): qty 2

## 2024-03-26 NOTE — Progress Notes (Signed)
 [facility] ROUNDING NOTE VERGA DABNEY EAV:[id number]  DOB: 1935-08-24  DOA: 03/06/2024  PCP: Roselind Congo, MD  03/26/2024,4:42 PM  LOS: 20 days    Code Status: DNR   From:   [name] current Dispo: likely SNF    89 year old female-at baseline and needs [name] to help remove Prior smoker chronic oxygen  2/2 gold 2 COPD  HTN depression iron deficiency anemia Previous admission right femoral fracture status post IM nail Hospitalized 11/4-11/15 hypertensive emergency?  Seizure-like activity secondary to hemosiderin deposition Keppra  started she was delirious-- had demand ischemia-- dysphagia with severe mid esophageal dysmotility recommended dysphagia diet at discharge 03/06/24 presented from the ED seizure twitching right-sided gaze response to Versed  intubated loaded with Keppra  start propofol  admit by ICU-WBC 7 hemoglobin 10 platelet 265--sodium 144 potassium 4.1 BUN/creatinine 17/0.4 LFTs normal CXR atelectasis-CT head no acute intracranial abnormality Epileptologist consulted recommended phenytoin  Vimpat  Keppra  gabapentin  and outpatient follow-up and signed off 4/17 4/17 --transferred to hospitalist  Plan  Toxic metabolic encephalopathy secondary to status epilepticus Underlying mild to moderate dementia PTA Delirium/confusion  some beter--wax/wanes-- low reserve/ long duration to improve as per epileptologist Stopped hospital Seroquel --continue Celexa  10 Continue  Vimpat  100 bd Keppra  750 bd --switch to oral phenytoin  75 mg twice daily and then 100 at bedtime   Low-grade temp, leukocytosis--Repeat CXR 4/23 only atelectasis Relative risk for aspiration --possible silent low-grade temps at times with mild white count elevation-see below discussion New oxygen  requirement 4/29--get CXR--if +, start Abx and await GOC per patient family  Dysphagia secondary to encephalopathy and seizures Prior admission 11/2022 severe mid esophageal dysmotility-supposed to be on dysphagia 1 diet-was eating  regular food at facility Core track placed 4/14 continues feeds Ensure 237 twice daily, Osmolite 50 cc/H-added free water  200 every 6 as risk for volume depletion cleared for dysphagia 1 but extremely high risk for pocketing and aspiration-pocketing 4/27--given todays events, maintain NPO Continue Flexi-Seal although less stool than it Family interested in PEG tube placement understanding implications that this would not prevent aspiration or reflux into the GI tract--cannot get placed by IR--gen surgery rec's  continuing first goc discussion given risks of anesthesia and worsening confusion and risk of procedure  AKI secondary to lnsensible losses ARB Start free water  200 every 6 -NSL lock the iv and seeif she can miatain hydration via core-track free water  alone--if not resume IVF until goals clearer I have discontinued irbesartan  150--Stable at this time  Acute on chronic respiratory failure with COPD Previously on oxygen  currently on roommale Continue albuterol  2.5 Q3 as needed Brovana  15 twice daily Pulmicort  0.5 twice daily Yupelri  175 daily  HTN Continue amlodipine  10 , atorvastatin , PRNs in place hydralazine  metoprolol   Goals of care-palliative has been engaged -d/w daughter on phone-4/25, 4/27--palliative care has seen and patient and family want PEG tube placed o 4/29 she seems to have aspirated on minimal oral fluids--see above discussion--pollutive planning family meeting 4/30  DVT prophylaxis: Heparin   Status is: Inpatient Remains inpatient appropriate because:   Requires further management workup  Subjective:  More coherent "[place]" Cannot tell me place/time  Feels a litle warm Screams wheen she is moved  Objective + exam Vitals:   03/26/24 0706 03/26/24 0807 03/26/24 1208 03/26/24 1529  BP:  (!) 122/52 (!) 107/55 (!) 118/57  Pulse:  84 73   Resp:  18 20 20   Temp:  98.8 F (37.1 C) 97.9 F (36.6 C) 98.9 F (37.2 C)  TempSrc:  Oral Axillary Axillary   SpO2:  95% 95%   Weight: 41.1 kg     Height:       Filed Weights   03/23/24 0703 03/24/24 0411 03/26/24 0706  Weight: 41.9 kg 41.9 kg 41.1 kg    Examination:  Core track in place pleasant frail no distress--some confusion but improved compared to prior EOMI NCAT no focal deficit right eye less sticky and improved S1-S2 no murmur-on monitors sinus rhythm Chest is clear to my exam Abdo and soft no rebound No lower extremity edema Neuro gross motor grossly unchanged from prior   Data Reviewed: reviewed   CBC    Component Value Date/Time   WBC 8.4 03/26/2024 0548   RBC 3.33 (L) 03/26/2024 0548   HGB 9.8 (L) 03/26/2024 0548   HCT 30.6 (L) 03/26/2024 0548   PLT 396 03/26/2024 0548   MCV 91.9 03/26/2024 0548   MCH 29.4 03/26/2024 0548   MCHC 32.0 03/26/2024 0548   RDW 13.3 03/26/2024 0548   LYMPHSABS 0.8 03/26/2024 0548   MONOABS 0.8 03/26/2024 0548   EOSABS 0.2 03/26/2024 0548   BASOSABS 0.0 03/26/2024 0548      Latest Ref Rng & Units 03/26/2024    5:48 AM 03/25/2024    9:12 AM 03/24/2024    4:54 AM  CMP  Glucose 70 - 99 mg/dL 272  536  644   BUN 8 - 23 mg/dL 22  25  32   Creatinine 0.44 - 1.00 mg/dL 0.34  7.42  5.95   Sodium 135 - 145 mmol/L 136  134  139   Potassium 3.5 - 5.1 mmol/L 4.6  4.6  4.3   Chloride 98 - 111 mmol/L 98  99  100   CO2 22 - 32 mmol/L 29  27  30    Calcium  8.9 - 10.3 mg/dL 9.6  9.6  9.7     Scheduled Meds:  amLODipine   10 mg Per Tube Daily   arformoterol   15 mcg Nebulization BID   aspirin   81 mg Per Tube Daily   atorvastatin   40 mg Per Tube QHS   budesonide  (PULMICORT ) nebulizer solution  0.5 mg Nebulization BID   citalopram   10 mg Per Tube Daily   diphenoxylate -atropine   1 tablet Per Tube QID   feeding supplement  237 mL Per Tube BID BM   free water   200 mL Per Tube Q6H   gabapentin   100 mg Per Tube TID   heparin   5,000 Units Subcutaneous Q8H   lacosamide   100 mg Per Tube BID   levETIRAcetam   750 mg Per Tube BID   multivitamin with  minerals  1 tablet Per Tube Daily   nutrition supplement (JUVEN)  1 packet Per Tube BID BM   mouth rinse  15 mL Mouth Rinse 4 times per day   phenytoin   100 mg Oral QHS   phenytoin   75 mg Oral BID   polyvinyl alcohol   1 drop Right Eye TID   revefenacin   175 mcg Nebulization Daily   Continuous Infusions:  feeding supplement (OSMOLITE 1.2 CAL) 50 mL/hr at 03/26/24 1116    Time 38  [name], MD  [facility]

## 2024-03-26 NOTE — Progress Notes (Addendum)
 Pt decreased in appetite

## 2024-03-26 NOTE — Progress Notes (Addendum)
 Nutrition Follow-up  DOCUMENTATION CODES:   Underweight, Severe malnutrition in context of social or environmental circumstances  INTERVENTION:   Continue tube Feeds via Cortrak: Osmolite 1.2 at 50 ml/h (1200 ml per day)  Provides 1440 kcal, 67 gm protein, 984 ml free water  daily  Recommend continuing enteral nutrition until pt is able to consistently meet >60% of estimated nutritional needs via PO. Continue Multivitamin w/ minerals per tube daily Ensure Enlive po BID, each supplement provides 350 kcal and 20 grams of protein. Continue 1 packet Juven BID per tube, each packet provides 95 calories, 2.5 grams of protein to support wound healing  *RD will continue to monitor and adjust tube feeds as necessary if PEG tube is within GOC.   NUTRITION DIAGNOSIS:   Severe Malnutrition related to social / environmental circumstances (communal living, poor functional status) as evidenced by severe fat depletion, severe muscle depletion.  - Still applicable   GOAL:   Patient will meet greater than or equal to 90% of their needs   -Meeting via TF's  MONITOR:   PO intake, Supplement acceptance, Labs, I & O's, TF tolerance  REASON FOR ASSESSMENT:   Ventilator, Consult Enteral/tube feeding initiation and management  ASSESSMENT:   Pt with hx of HTN, HLD, and COPD on home O2 presented to ED from her facility with seizure-like activity.  4/09 - presented to ED from facility, intubated  4/10 - Tube feeds started via OGT 4/13 - Extubated, tube feeds stopped  4/14 - FLD, Cortrak placed (gastric) and tube feeds restarted  4/15 - s/p BSE- NPO 4/21 - diet advanced to Dysphagia 1, Thin Liquids 4/28 - IR consulted for PEG: No window for PEG tube, Surgery consulted-awaiting concrete GOC before placing PEG  Patient sleeping on visit, no family bedside. Tube feeds running at goal, per RN patient tolerating, no vomiting and having BM's. MD inquired about PEG tube due to ongoing dysphagia and  very poor PO intake. In addition RN reports patient is pocketing food. Per palliative, family would like to pursue PEG tube as an option. IR evaluated patient yesterday for PEG tube placement and determined there is no window for PEG tube in patient's abdominal wall. Surgery was consulted and they will discuss with family about lap assisted G-tube placement but they strongly suggest to continue GOC conversations in the setting of her worsening dementia, chronic respiratory failure, and AKI.   Admit weight: 41.8 kg  Current weight: 41.9 kg    Average Meal Intake: 4/26-4/28: 8% intake x 8 recorded meals  Nutritionally Relevant Medications: Scheduled Meds:  feeding supplement  237 mL Per Tube BID BM   free water   200 mL Per Tube Q6H   multivitamin with minerals  1 tablet Per Tube Daily   nutrition supplement (JUVEN)  1 packet Per Tube BID BM   Continuous Infusions:  feeding supplement (OSMOLITE 1.2 CAL) 1,000 mL (03/25/24 1631)   Labs Reviewed: Creatinine 0.36, Vitamin D  10.47  CBG ranges from 117-133 mg/dL over the last 24 hours HgbA1c 4.8  Diet Order:   Diet Order             DIET - DYS 1 Room service appropriate? No; Fluid consistency: Thin  Diet effective now                   EDUCATION NEEDS:   Not appropriate for education at this time  Skin:  Skin Assessment: Skin Integrity Issues: Skin Integrity Issues:: Stage II Stage II: Coyccx  Last BM:  03/25/24 - type 7 via FMS  Height:   Ht Readings from Last 1 Encounters:  03/07/24 5' (1.524 m)    Weight:   Wt Readings from Last 1 Encounters:  03/24/24 41.9 kg    Ideal Body Weight:  45.5 kg  BMI:  Body mass index is 18.04 kg/m.  Estimated Nutritional Needs:   Kcal:  1400-1600 kcal  Protein:  70-90 gm  Fluid:  >1.4L/day   Frederik Jansky, RD Registered Dietitian  See Amion for more information

## 2024-03-26 NOTE — Plan of Care (Signed)
  Problem: Clinical Measurements: Goal: Ability to maintain clinical measurements within normal limits will improve Outcome: Progressing   Problem: Pain Managment: Goal: General experience of comfort will improve and/or be controlled Outcome: Progressing   Problem: Safety: Goal: Ability to remain free from injury will improve Outcome: Progressing   Problem: Education: Goal: Knowledge of General Education information will improve Description: Including pain rating scale, medication(s)/side effects and non-pharmacologic comfort measures Outcome: Not Progressing   Problem: Activity: Goal: Risk for activity intolerance will decrease Outcome: Not Progressing   Problem: Nutrition: Goal: Adequate nutrition will be maintained Outcome: Not Progressing

## 2024-03-26 NOTE — Progress Notes (Signed)
 Pt PIV malpositioned and removed. 2/2 pt not having any scheduled IV meds, provider on-call, Dr. Segars, notified of above and endorsed PIV did not need to be reinserted at this time.

## 2024-03-26 NOTE — Progress Notes (Signed)
 1530- Pt desating into mid 80s. Pt does not appear to be in distress. Pt placed on 3L [place], and mouth suctioned. Dr. Haywood Lisle notified.  1540- Dr. Samtani at bedside. New orders received to keep pt NPO and Chest x-ray.

## 2024-03-26 NOTE — Progress Notes (Signed)
 Daily Progress Note   Patient Name: Chloe Wilcox       Date: 03/26/2024 DOB: 08/06/1935  Age: 89 y.o. MRN#: [id number] Attending Physician: Samtani, Jai-Gurmukh, MD Primary Care Physician: Roselind Congo, MD Admit Date: 03/06/2024  Reason for Consultation/Follow-up: Establishing goals of care  Subjective: Medical records reviewed including progress notes, labs, imaging. Patient assessed at the bedside.  She is alert and tells me she is doing pretty well today. Also states "I am weak and tired, sometimes you just give out at my age." No family present during the visit.  Attempted to discuss potential PEG placement with patient and explore her thoughts and feelings. She is confused and unable to participate. I then called patient's daughter [name] to follow up on yesterday's conversation. Provided update on the above interaction. She is grateful that patient is more interactive today.  [name] spoke to her daughter/patient's granddaughter [name] to inquire about potential timing of family meeting. [name] is off work Advertising account executive and requests to meet around 2pm. I shared that I am off service but that I would ask one of my colleagues to meet with them as close to their preferred time as possible.   I also shared update that patient is not candidate for percutaneous PEG placement given CT findings and anatomical positioning. Emphasized importance of discussing the higher risks involved with surgical placement and solidifying goals/expectations with an invasive surgical procedure before proceeding. Family verbalized their understanding. They remain hopeful for recovery and acknowledge uncertain in the path forward.  Questions and concerns addressed. PMT will continue to support holistically.   Length of Stay: 20   Physical Exam Vitals and nursing  note reviewed.  Constitutional:      General: She is not in acute distress.    Appearance: She is ill-appearing.     Comments: Cortrak in place  Cardiovascular:     Rate and Rhythm: Normal rate.  Pulmonary:     Effort: Pulmonary effort is normal.  Neurological:     Mental Status: She is alert and easily aroused.  Psychiatric:        Behavior: Behavior normal.        Cognition and Memory: Cognition is impaired.            Vital Signs: BP (!) 122/52 (BP Location: Right Arm)   Pulse 84   Temp 98.8 F (37.1 C) (Oral)   Resp 18   Ht 5' (1.524 m)   Wt 41.9 kg   SpO2 95%   BMI 18.04 kg/m  SpO2: SpO2: 95 % O2 Device: O2 Device: Room Air O2 Flow Rate: O2  Flow Rate (L/min): 2 L/min      Palliative Assessment/Data: 30%   Palliative Care Assessment & Plan   Patient Profile: 89 y.o. female  with past medical history of epilepsy, cognitive deficits, failure to thrive admitted on 03/06/2024 with twitching and right-sided gaze, less responsive.    EMS was called and administered IM Versed  without improvement before establishing IV access and administering an additional Versed  dose IV. There was roughly 45 minutes reported before seizures resolved. She was brought to [facility] ED where she required intubation for airway protection.  Extubated on 4/13 and currently still has ongoing seizure activity.   PMT has been consulted to assist with goals of care conversation in light of failure to thrive and chronic comorbidities.  Assessment: Goals of care conversation Acute encephalopathy secondary to status epilepticus  Seizure disorder with breakthrough seizures/status epilepticus Acute on chronic respiratory failure, improved to RA  AKI LLL Pneumonia  Recommendations/Plan: Continue DNR Continue full scope treatment Family willing to pursue PEG to allow more time for improvement - will need to discuss further in light of surgical placement rather than IR Tentative family meeting on Wednesday  4/30 - family has requested 2pm. PMT provider will reach out to confirm tomorrow Family would consider de-escalation of aggressive care if she declined significantly, if she desired to stop aggressive measures, or if she had complications that worsened her quality of life Ongoing GOC discussions pending clinical course PMT will continue to follow and support   Prognosis:  Guarded  Discharge Planning: To Be Determined  Care plan was discussed with Patient, patient's daughter and granddaughter, MD          Angeline Kemps, PA-C  Palliative Medicine Team Team phone # [phone]  Thank you for allowing the Palliative Medicine Team to assist in the care of this patient. Please utilize secure chat with additional questions, if there is no response within 30 minutes please call the above phone number.  Palliative Medicine Team providers are available by phone from 7am to 7pm daily and can be reached through the team cell phone.  Should this patient require assistance outside of these hours, please call the patient's attending physician.

## 2024-03-27 DIAGNOSIS — Z7189 Other specified counseling: Secondary | ICD-10-CM | POA: Diagnosis not present

## 2024-03-27 DIAGNOSIS — Z515 Encounter for palliative care: Secondary | ICD-10-CM | POA: Diagnosis not present

## 2024-03-27 DIAGNOSIS — G40901 Epilepsy, unspecified, not intractable, with status epilepticus: Secondary | ICD-10-CM | POA: Diagnosis not present

## 2024-03-27 LAB — GLUCOSE, CAPILLARY
Glucose-Capillary: 125 mg/dL — ABNORMAL HIGH (ref 70–99)
Glucose-Capillary: 135 mg/dL — ABNORMAL HIGH (ref 70–99)
Glucose-Capillary: 143 mg/dL — ABNORMAL HIGH (ref 70–99)
Glucose-Capillary: 136 mg/dL — ABNORMAL HIGH (ref 70–99)
Glucose-Capillary: 139 mg/dL — ABNORMAL HIGH (ref 70–99)
Glucose-Capillary: 152 mg/dL — ABNORMAL HIGH (ref 70–99)

## 2024-03-27 LAB — CBC WITH DIFFERENTIAL/PLATELET
Abs Immature Granulocytes: 0.04 10*3/uL (ref 0.00–0.07)
Basophils Absolute: 0.1 10*3/uL (ref 0.0–0.1)
Basophils Relative: 1 %
Eosinophils Absolute: 0.2 10*3/uL (ref 0.0–0.5)
Eosinophils Relative: 2 %
HCT: 30.1 % — ABNORMAL LOW (ref 36.0–46.0)
Hemoglobin: 9.5 g/dL — ABNORMAL LOW (ref 12.0–15.0)
Immature Granulocytes: 1 %
Lymphocytes Relative: 10 %
Lymphs Abs: 0.8 10*3/uL (ref 0.7–4.0)
MCH: 29 pg (ref 26.0–34.0)
MCHC: 31.6 g/dL (ref 30.0–36.0)
MCV: 91.8 fL (ref 80.0–100.0)
Monocytes Absolute: 0.8 10*3/uL (ref 0.1–1.0)
Monocytes Relative: 10 %
Neutro Abs: 6.5 10*3/uL (ref 1.7–7.7)
Neutrophils Relative %: 76 %
Platelets: 411 10*3/uL — ABNORMAL HIGH (ref 150–400)
RBC: 3.28 MIL/uL — ABNORMAL LOW (ref 3.87–5.11)
RDW: 13.1 % (ref 11.5–15.5)
WBC: 8.4 10*3/uL (ref 4.0–10.5)
nRBC: 0 % (ref 0.0–0.2)

## 2024-03-27 LAB — BASIC METABOLIC PANEL WITH GFR
Anion gap: 7 (ref 5–15)
BUN: 24 mg/dL — ABNORMAL HIGH (ref 8–23)
CO2: 30 mmol/L (ref 22–32)
Calcium: 9.3 mg/dL (ref 8.9–10.3)
Chloride: 96 mmol/L — ABNORMAL LOW (ref 98–111)
Creatinine, Ser: 0.34 mg/dL — ABNORMAL LOW (ref 0.44–1.00)
GFR, Estimated: >60 mL/min (ref >60)
Glucose, Bld: 125 mg/dL — ABNORMAL HIGH (ref 70–99)
Potassium: 4.5 mmol/L (ref 3.5–5.1)
Sodium: 133 mmol/L — ABNORMAL LOW (ref 135–145)

## 2024-03-27 MED ORDER — FREE WATER
100.0000 mL | Freq: Four times a day (QID) | Status: DC
  Administered 2024-03-27 – 2024-04-01 (×18): 100 mL

## 2024-03-27 NOTE — Care Management Important Message (Signed)
 Important Message  Patient Details  Name: Chloe Wilcox MRN: [id number] Date of Birth: 08-05-1935   Important Message Given:  Yes - Medicare IM     Felix Host 03/27/2024, 12:32 PM

## 2024-03-27 NOTE — Progress Notes (Signed)
 PROGRESS NOTE    Chloe Wilcox  ZOX:[id number] DOB: 17-May-1935 DOA: 03/06/2024 PCP: Roselind Congo, MD   Brief Narrative:  89 year old female presents from nursing facility requiring [name] to move at baseline.  Past medical history includes COPD not currently on oxygen , history of tobacco abuse, depression, iron deficiency anemia, prior right femoral fracture now status post IM nail.  Patient presents to our facility from nursing facility with seizure-like activity and right sided gaze that responded to Versed  initially intubated for airway protection.  PCCM called for admission and neurology called in consult.  Patient now stabilizing, extubated successfully on 4/13 and transferred out to hospitalist team from ICU on Vimpat , gabapentin , phenytoin  and gabapentin  on 4/17.   Assessment & Plan:   Principal Problem:   Status epilepticus (HCC)   Goals of care - Palliative care consulted, plan for meeting later today with family in regards to goals of care given patient's age chronic comorbid conditions and current quality of life has been discussion in regards to palliative care.  Patient remains DNR DNI at this time.  Acute metabolic encephalopathy/delirium secondary to status epilepticus, resolved Chronic dementia, mild to moderate -Mental status continues to wax and wane, neurology indicating likely a long-term duration to improve, concerned this may be patient's new baseline -Continue Vimpat , Keppra , phenytoin , gabapentin  -Off Seroquel , continue citalopram  - Reportedly off AEDs prior to hospitalization (November 2024)  Leukocytosis High risk for aspiration pneumonia Transient hypoxia Chest xray unremarkable for overt findings/opacity She remains high risk for aspiration/pneumonia No indication for antibiotics at this time   Dysphagia secondary to encephalopathy and seizures Prior admission 11/2022 severe mid esophageal dysmotility-supposed to be on dysphagia 1 diet - but was  reportedly eating regular food at facility Core track placed 4/14 continues feeds Ensure 237 twice daily, Osmolite 50 Decrease free water  to 100cc q6h given hyponatremia - Cleared for dysphagia 1 but continues pocketing food/high risk for aspiration Continue Flexi-Seal until output diminishes. Downtrending appropriately Family interested in PEG tube placement understanding implications that this would not prevent aspiration or reflux into the GI tract--cannot get placed by IR--gen surgery rec's  continuing first goc discussion given risks of anesthesia and worsening confusion and risk of procedure  AKI, prerenal Hypervolemic hyponatremia -Decrease free water  as above 100cc q6h -Follow repeat BMP -Hold ARB irbesartan    Acute on chronic respiratory failure with COPD Previously on oxygen  currently on room air Continue nebs/inhalers (Yupleri, albuterol , brovana )  Essential HTN Continue amlodipine  10 , atorvastatin , PRNs in place hydralazine  metoprolol    DVT prophylaxis: heparin  injection 5,000 Units Start: 03/06/24 2200 SCDs Start: 03/06/24 2155 Code Status:   Code Status: Do not attempt resuscitation (DNR) PRE-ARREST INTERVENTIONS DESIRED Family Communication: None present  Status is: Inpt  Dispo: The patient is from: SNF ([facility])              Anticipated d/c is to: Same              Anticipated d/c date is: 48-72h              Patient currently not medically stable for discharge  Consultants:  Palliative, IR, General Surgery  Procedures:  None  Antimicrobials:  None currently   Subjective: No acute issues/events overnight  Objective: Vitals:   03/26/24 1930 03/26/24 2010 03/26/24 2253 03/27/24 0323  BP: (!) 118/58  (!) 111/59 97/67  Pulse: 71 71 69 67  Resp: 20 18 14 20   Temp: (!) 96.3 F (35.7 C)  98.6 F (37  C) 98.6 F (37 C)  TempSrc: Axillary  Axillary Axillary  SpO2: 96% 91% 100% 96%  Weight:    42.4 kg  Height:        Intake/Output Summary (Last 24  hours) at 03/27/2024 0706 Last data filed at 03/27/2024 1610 Gross per 24 hour  Intake 2157.5 ml  Output 1600 ml  Net 557.5 ml   Filed Weights   03/24/24 0411 03/26/24 0706 03/27/24 0323  Weight: 41.9 kg 41.1 kg 42.4 kg    Examination:  General:  Pleasantly resting in bed, No acute distress. HEENT:  Normocephalic atraumatic.  Sclerae nonicteric, noninjected.  Extraocular movements intact bilaterally. Neck:  Without mass or deformity.  Trachea is midline. Lungs:  Clear to auscultate bilaterally without rhonchi, wheeze, or rales. Heart:  Regular rate and rhythm. Abdomen:  Soft, nontender, nondistended. Extremities: Without cyanosis, clubbing, edema.  Data Reviewed: I have personally reviewed following labs and imaging studies  CBC: Recent Labs  Lab 03/23/24 0715 03/24/24 0454 03/25/24 0912 03/26/24 0548 03/27/24 0543  WBC 11.5* 10.7* 11.0* 8.4 8.4  NEUTROABS 9.6* 8.8* 9.2* 6.4 6.5  HGB 9.9* 9.5* 9.7* 9.8* 9.5*  HCT 32.2* 31.3* 31.5* 30.6* 30.1*  MCV 94.2 94.8 94.3 91.9 91.8  PLT 393 408* 399 396 411*   Basic Metabolic Panel: Recent Labs  Lab 03/23/24 0715 03/24/24 0454 03/25/24 0912 03/26/24 0548 03/27/24 0543  NA 139 139 134* 136 133*  K 4.7 4.3 4.6 4.6 4.5  CL 100 100 99 98 96*  CO2 31 30 27 29 30   GLUCOSE 124* 132* 110* 103* 125*  BUN 35* 32* 25* 22 24*  CREATININE 0.34* 0.42* 0.34* 0.36* 0.34*  CALCIUM  10.0 9.7 9.6 9.6 9.3   GFR: Estimated Creatinine Clearance: 33.8 mL/min (A) (by C-G formula based on SCr of 0.34 mg/dL (L)). Liver Function Tests: No results for input(s): "AST", "ALT", "ALKPHOS", "BILITOT", "PROT", "ALBUMIN" in the last 168 hours. No results for input(s): "LIPASE", "AMYLASE" in the last 168 hours. No results for input(s): "AMMONIA" in the last 168 hours. Coagulation Profile: No results for input(s): "INR", "PROTIME" in the last 168 hours. Cardiac Enzymes: No results for input(s): "CKTOTAL", "CKMB", "CKMBINDEX", "TROPONINI" in the last  168 hours. BNP (last 3 results) No results for input(s): "PROBNP" in the last 8760 hours. HbA1C: No results for input(s): "HGBA1C" in the last 72 hours. CBG: Recent Labs  Lab 03/26/24 1207 03/26/24 1510 03/26/24 1928 03/26/24 2251 03/27/24 0325  GLUCAP 113* 121* 99 102* 125*   Lipid Profile: No results for input(s): "CHOL", "HDL", "LDLCALC", "TRIG", "CHOLHDL", "LDLDIRECT" in the last 72 hours. Thyroid  Function Tests: No results for input(s): "TSH", "T4TOTAL", "FREET4", "T3FREE", "THYROIDAB" in the last 72 hours. Anemia Panel: No results for input(s): "VITAMINB12", "FOLATE", "FERRITIN", "TIBC", "IRON", "RETICCTPCT" in the last 72 hours. Sepsis Labs: No results for input(s): "PROCALCITON", "LATICACIDVEN" in the last 168 hours.  No results found for this or any previous visit (from the past 240 hours).       Radiology Studies: DG CHEST PORT 1 VIEW Result Date: 03/26/2024 CLINICAL DATA:  Pneumonia. EXAM: PORTABLE CHEST 1 VIEW COMPARISON:  March 20, 2024. FINDINGS: The heart size and mediastinal contours are within normal limits. Both lungs are clear. Feeding tube is seen entering stomach. Degenerative changes are seen involving both glenohumeral joints. IMPRESSION: No active disease. Electronically Signed   By: Rosalene Colon M.D.   On: 03/26/2024 16:55   CT ABDOMEN PELVIS WO CONTRAST Result Date: 03/25/2024 CLINICAL DATA:  Seizure,  dysphagia and evaluation for possible percutaneous gastrostomy tube placement for nutrition. EXAM: CT ABDOMEN AND PELVIS WITHOUT CONTRAST TECHNIQUE: Multidetector CT imaging of the abdomen and pelvis was performed following the standard protocol without IV contrast. RADIATION DOSE REDUCTION: This exam was performed according to the departmental dose-optimization program which includes automated exposure control, adjustment of the mA and/or kV according to patient size and/or use of iterative reconstruction technique. COMPARISON:  Prior CT of the abdomen on  08/03/2017 FINDINGS: Lower chest: Bibasilar atelectasis, right greater than left. No hiatal hernia. Hepatobiliary: No focal liver abnormality is seen. No gallstones, gallbladder wall thickening, or biliary dilatation. Pancreas: Unremarkable. No pancreatic ductal dilatation or surrounding inflammatory changes. Spleen: Normal in size without focal abnormality. Adrenals/Urinary Tract: No adrenal masses or hydronephrosis. Several left-sided renal cysts present with the largest measuring 6.5 cm and demonstrating simple fluid densely internally. The bladder is unremarkable. Stomach/Bowel: Feeding tube enters the stomach and terminates in the distal stomach. The stomach is very high in position within the left upper quadrant and is completely posterior to the splenic flexure of the colon as well as the distal transverse colon and tip of the left lobe of the liver with no adequate percutaneous window between the abdominal wall for safe gastrostomy tube placement. There is no evidence of significant ileus or bowel obstruction. No free intraperitoneal air identified. No evidence of acute inflammatory process involving bowel. Rectal tube in the rectum. Vascular/Lymphatic: Atherosclerosis of the abdominal aorta without aneurysm. No enlarged lymph nodes identified. Reproductive: Heavily calcified enlarged uterus likely relates to degenerated fibroid tissue. Other: No hernias, ascites or focal abscess identified. Musculoskeletal: Leftward convex scoliosis of the lumbar spine. IMPRESSION: 1. The stomach is very high in position within the left upper quadrant and is completely posterior to the splenic flexure of the colon as well as the distal transverse colon and tip of the left lobe of the liver with no adequate percutaneous window between the abdominal wall for safe gastrostomy tube placement. The patient is not a candidate for percutaneous gastrostomy tube placement. 2. Bibasilar atelectasis, right greater than left. 3.  Atherosclerosis of the abdominal aorta without aneurysm. 4. Heavily calcified enlarged uterus likely relates to degenerated fibroid tissue. 5. Leftward convex scoliosis of the lumbar spine. 6. Left-sided renal cysts with the largest measuring 6.5 cm and demonstrating simple fluid densely internally. Electronically Signed   By: Erica Hau M.D.   On: 03/25/2024 13:30        Scheduled Meds:  amLODipine   10 mg Per Tube Daily   arformoterol   15 mcg Nebulization BID   aspirin   81 mg Per Tube Daily   atorvastatin   40 mg Per Tube QHS   budesonide  (PULMICORT ) nebulizer solution  0.5 mg Nebulization BID   citalopram   10 mg Per Tube Daily   diphenoxylate -atropine   1 tablet Per Tube QID   feeding supplement  237 mL Per Tube BID BM   free water   200 mL Per Tube Q6H   gabapentin   100 mg Per Tube TID   heparin   5,000 Units Subcutaneous Q8H   lacosamide   100 mg Per Tube BID   levETIRAcetam   750 mg Per Tube BID   multivitamin with minerals  1 tablet Per Tube Daily   nutrition supplement (JUVEN)  1 packet Per Tube BID BM   mouth rinse  15 mL Mouth Rinse 4 times per day   phenytoin   100 mg Per Tube QHS   phenytoin   75 mg Per Tube BID  polyvinyl alcohol   1 drop Right Eye TID   revefenacin   175 mcg Nebulization Daily   Continuous Infusions:  feeding supplement (OSMOLITE 1.2 CAL) 50 mL/hr at 03/27/24 0609     LOS: 21 days   Time spent:  Haydee Lipa, DO [facility]  If 7PM-7AM, please contact night-coverage [email or web address]  03/27/2024, 7:06 AM

## 2024-03-27 NOTE — Plan of Care (Signed)
  Problem: Education: Goal: Knowledge of General Education information will improve Description: Including pain rating scale, medication(s)/side effects and non-pharmacologic comfort measures Outcome: Progressing   Problem: Health Behavior/Discharge Planning: Goal: Ability to manage health-related needs will improve Outcome: Progressing   Problem: Clinical Measurements: Goal: Ability to maintain clinical measurements within normal limits will improve Outcome: Progressing Goal: Will remain free from infection Outcome: Progressing Goal: Diagnostic test results will improve Outcome: Progressing Goal: Respiratory complications will improve Outcome: Progressing Goal: Cardiovascular complication will be avoided Outcome: Progressing   Problem: Coping: Goal: Level of anxiety will decrease Outcome: Progressing   Problem: Elimination: Goal: Will not experience complications related to bowel motility Outcome: Progressing Goal: Will not experience complications related to urinary retention Outcome: Progressing   Problem: Pain Managment: Goal: General experience of comfort will improve and/or be controlled Outcome: Progressing

## 2024-03-27 NOTE — Progress Notes (Signed)
 Palliative Medicine Inpatient Follow Up Note HPI: 89 y.o. female  with past medical history of epilepsy, cognitive deficits, failure to thrive admitted on 03/06/2024 with twitching and right-sided gaze, less responsive.    EMS was called and administered IM Versed  without improvement before establishing IV access and administering an additional Versed  dose IV. There was roughly 45 minutes reported before seizures resolved. She was brought to [facility] ED where she required intubation for airway protection.  Extubated on 4/13 and currently still has ongoing seizure activity.   PMT has been consulted to assist with goals of care conversation in light of failure to thrive and chronic comorbidities.  Today's Discussion 03/27/2024  *Please note that this is a verbal dictation therefore any spelling or grammatical errors are due to the "[employer]" system interpretation.  Chart reviewed inclusive of vital signs, progress notes, laboratory results, and diagnostic images.   A family meeting was held at 2:00 this afternoon with patient's daughter, [name], granddaughter [name], nieces Jullie Oiler, and [place].  We reviewed Chloe Wilcox's complicated 20-day hospitalization in the setting of her having significant seizure activity.  We reviewed her ongoing clinical difficulties in the setting of her encephalopathy, dysphagia, and respiratory failure due to COPD.  Discussions were held regarding gastrostomy tube placement.  Described the differences between short-term core track tube feedings and long-term gastrostomy tube placement.  I shared that having a gastrostomy tube does not eliminate your ongoing risk of aspiration from secretions.  We also reviewed the possible pain of the procedure, infection risk, risk for injuring other organs with insertion, and risk associated with anesthesia.  I shared often when patients stop eating and drinking this is one of the precursors to their end-of-life  journey.  We discussed how difficult this is all been for patient's family as Nyarai was very vocal prior to this hospitalization and did seem to have a lot of spunk to work.  We reviewed the complications associated with seizures and with ongoing delirium.  We reviewed the potential outcomes with a gastrostomy tube and without a gastrostomy tube.  I did gently approach the topic of hospice care should a gastrostomy tube not be considered.  Patient's granddaughter feels that Jonelle Neri is not ready to give up but she and her family would like to take a couple of days to speak among each other to determine what the next best step for any will be.  Goals at this time are to continue present measures with the hopes of improvement.  Questions and concerns addressed/Palliative Support Provided.   Objective Assessment: Vital Signs Vitals:   03/27/24 1116 03/27/24 1540  BP: 123/60 115/60  Pulse: 80 78  Resp: (!) 21 13  Temp: 98.5 F (36.9 C) 99.1 F (37.3 C)  SpO2: 94% 94%    Intake/Output Summary (Last 24 hours) at 03/27/2024 1812 Last data filed at 03/27/2024 1800 Gross per 24 hour  Intake 2750 ml  Output 700 ml  Net 2050 ml   Last Weight  Most recent update: 03/27/2024  3:28 AM    Weight  42.4 kg (93 lb 7.6 oz)            Gen: Elderly African-American female chronically ill in appearance HEENT: Core track in place, dry mucous membranes CV: Regular rate and rhythm PULM: On room air breathing is even and unlabored ABD: soft/nontender EXT: No edema Neuro: Yelling out,  agitated  SUMMARY OF RECOMMENDATIONS   Continue DNR Continue full scope treatment Open and honest conversations held  regarding gastrostomy tube placement Family would consider de-escalation of aggressive care if she declined significantly, if she desired to stop aggressive measures, or if she had complications that worsened her quality of life Ongoing GOC discussions pending clinical course PMT will continue to  follow and support - Josseline Arlester Ladd will check back in with family on Friday  Time Spent: 65 Billing based on MDM: High ______________________________________________________________________________________ [facility] [place] Palliative Medicine Team Team Cell Phone: [phone] Please utilize secure chat with additional questions, if there is no response within 30 minutes please call the above phone number  Palliative Medicine Team providers are available by phone from 7am to 7pm daily and can be reached through the team cell phone.  Should this patient require assistance outside of these hours, please call the patient's attending physician.

## 2024-03-28 DIAGNOSIS — G40901 Epilepsy, unspecified, not intractable, with status epilepticus: Secondary | ICD-10-CM | POA: Diagnosis not present

## 2024-03-28 LAB — GLUCOSE, CAPILLARY
Glucose-Capillary: 137 mg/dL — ABNORMAL HIGH (ref 70–99)
Glucose-Capillary: 140 mg/dL — ABNORMAL HIGH (ref 70–99)
Glucose-Capillary: 134 mg/dL — ABNORMAL HIGH (ref 70–99)
Glucose-Capillary: 124 mg/dL — ABNORMAL HIGH (ref 70–99)
Glucose-Capillary: 125 mg/dL — ABNORMAL HIGH (ref 70–99)
Glucose-Capillary: 137 mg/dL — ABNORMAL HIGH (ref 70–99)

## 2024-03-28 NOTE — Progress Notes (Signed)
 PROGRESS NOTE    Chloe Wilcox  ZOX:[id number] DOB: November 07, 1935 DOA: 03/06/2024 PCP: Roselind Congo, MD   Brief Narrative:  89 year old female presents from nursing facility requiring [name] to move at baseline.  Past medical history includes COPD not currently on oxygen , history of tobacco abuse, depression, iron deficiency anemia, prior right femoral fracture now status post IM nail.  Patient presents to our facility from nursing facility with seizure-like activity and right sided gaze that responded to Versed  and intubated for airway protection.  PCCM called for admission and neurology called in consult.  Patient now stabilizing, extubated successfully on 4/13 and transferred out to hospitalist team from ICU on Vimpat , gabapentin , phenytoin  and gabapentin  on 4/17.   Assessment & Plan:   Principal Problem:   Status epilepticus (HCC)   Goals of care - Palliative care consulted, meeting 04/30 with family in regards to goals of care given patient's age chronic comorbid conditions and current quality of life has been discussion in regards to palliative care.  Patient remains DNR DNI at this time. - Family discussion ongoing, no decision yet on plans moving forward  Acute metabolic encephalopathy/Delirium, ongoing Secondary to status epilepticus, resolved Chronic dementia, mild to moderate -Mental status continues to wax and wane, neurology indicating likely a long-term duration to improve, concerned this may be patient's new baseline -Continue Vimpat , Keppra , phenytoin , gabapentin  -Off Seroquel , continue citalopram  - Reportedly off AEDs prior to hospitalization (November 2024)  Leukocytosis High risk for aspiration pneumonia Transient hypoxia Chest xray unremarkable for overt findings/opacity She remains high risk for aspiration/pneumonia No indication for antibiotics at this time   Dysphagia secondary to encephalopathy and seizures Prior admission 11/2022 severe mid esophageal  dysmotility-supposed to be on dysphagia 1 diet - but was reportedly eating regular food at facility Core track placed 4/14 continues feeds Ensure 237 twice daily, Osmolite 50 Decrease free water  to 100cc q6h given hyponatremia - Cleared for dysphagia 1 but continues pocketing food/high risk for aspiration Continue Flexi-Seal until output diminishes. Downtrending appropriately Family interested in PEG tube placement understanding implications that this would not prevent aspiration or reflux into the GI tract--cannot get placed by IR--gen surgery rec's  continuing first goc discussion given risks of anesthesia and worsening confusion and risk of procedure  AKI, prerenal Hypervolemic hyponatremia -Decrease free water  as above 100cc q6h -Follow repeat BMP -Hold ARB irbesartan    Acute on chronic respiratory failure with COPD Previously on oxygen : currently on room air Continue nebs/inhalers (Yupleri, albuterol , brovana )  Essential HTN Continue amlodipine  10 , atorvastatin , PRNs in place hydralazine  metoprolol    DVT prophylaxis: heparin  injection 5,000 Units Start: 03/06/24 2200 SCDs Start: 03/06/24 2155 Code Status:   Code Status: Do not attempt resuscitation (DNR) PRE-ARREST INTERVENTIONS DESIRED  Status is: Inpt  Dispo: The patient is from: SNF ([facility])              Anticipated d/c is to: Same              Anticipated d/c date is: 48-72h              Patient currently not medically stable for discharge  Consultants:  Palliative, IR, General Surgery  Procedures:  None  Antimicrobials:  None currently   Subjective: No acute issues/events overnight  Objective: Vitals:   03/27/24 2015 03/27/24 2020 03/27/24 2329 03/28/24 0300  BP: (!) 125/56  (!) 109/59 117/64  Pulse: 76 73 78 75  Resp: 20 18 20 19   Temp: 98.9 F (37.2 C)  98.1 F (36.7 C) 98.3 F (36.8 C)  TempSrc: Axillary  Axillary Axillary  SpO2: 94% 94% 95% 96%  Weight:    42.7 kg  Height:         Intake/Output Summary (Last 24 hours) at 03/28/2024 0701 Last data filed at 03/27/2024 2332 Gross per 24 hour  Intake 550 ml  Output 1000 ml  Net -450 ml   Filed Weights   03/26/24 0706 03/27/24 0323 03/28/24 0300  Weight: 41.1 kg 42.4 kg 42.7 kg    Examination:  General:  Pleasantly resting in bed, No acute distress. HEENT:  Normocephalic atraumatic.  Sclerae nonicteric, noninjected.  Extraocular movements intact bilaterally. Neck:  Without mass or deformity.  Trachea is midline. Lungs:  Clear to auscultate bilaterally without rhonchi, wheeze, or rales. Heart:  Regular rate and rhythm. Abdomen:  Soft, nontender, nondistended. Extremities: Without cyanosis, clubbing, edema.  Data Reviewed: I have personally reviewed following labs and imaging studies  CBC: Recent Labs  Lab 03/23/24 0715 03/24/24 0454 03/25/24 0912 03/26/24 0548 03/27/24 0543  WBC 11.5* 10.7* 11.0* 8.4 8.4  NEUTROABS 9.6* 8.8* 9.2* 6.4 6.5  HGB 9.9* 9.5* 9.7* 9.8* 9.5*  HCT 32.2* 31.3* 31.5* 30.6* 30.1*  MCV 94.2 94.8 94.3 91.9 91.8  PLT 393 408* 399 396 411*   Basic Metabolic Panel: Recent Labs  Lab 03/23/24 0715 03/24/24 0454 03/25/24 0912 03/26/24 0548 03/27/24 0543  NA 139 139 134* 136 133*  K 4.7 4.3 4.6 4.6 4.5  CL 100 100 99 98 96*  CO2 31 30 27 29 30   GLUCOSE 124* 132* 110* 103* 125*  BUN 35* 32* 25* 22 24*  CREATININE 0.34* 0.42* 0.34* 0.36* 0.34*  CALCIUM  10.0 9.7 9.6 9.6 9.3   GFR: Estimated Creatinine Clearance: 34 mL/min (A) (by C-G formula based on SCr of 0.34 mg/dL (L)).  CBG: Recent Labs  Lab 03/27/24 1117 03/27/24 1538 03/27/24 2013 03/27/24 2329 03/28/24 0309  GLUCAP 143* 136* 139* 152* 137*   Radiology Studies: DG CHEST PORT 1 VIEW Result Date: 03/26/2024 CLINICAL DATA:  Pneumonia. EXAM: PORTABLE CHEST 1 VIEW COMPARISON:  March 20, 2024. FINDINGS: The heart size and mediastinal contours are within normal limits. Both lungs are clear. Feeding tube is seen  entering stomach. Degenerative changes are seen involving both glenohumeral joints. IMPRESSION: No active disease. Electronically Signed   By: Rosalene Colon M.D.   On: 03/26/2024 16:55   Scheduled Meds:  amLODipine   10 mg Per Tube Daily   arformoterol   15 mcg Nebulization BID   aspirin   81 mg Per Tube Daily   atorvastatin   40 mg Per Tube QHS   budesonide  (PULMICORT ) nebulizer solution  0.5 mg Nebulization BID   citalopram   10 mg Per Tube Daily   diphenoxylate -atropine   1 tablet Per Tube QID   feeding supplement  237 mL Per Tube BID BM   free water   100 mL Per Tube Q6H   gabapentin   100 mg Per Tube TID   heparin   5,000 Units Subcutaneous Q8H   lacosamide   100 mg Per Tube BID   levETIRAcetam   750 mg Per Tube BID   multivitamin with minerals  1 tablet Per Tube Daily   nutrition supplement (JUVEN)  1 packet Per Tube BID BM   mouth rinse  15 mL Mouth Rinse 4 times per day   phenytoin   100 mg Per Tube QHS   phenytoin   75 mg Per Tube BID   polyvinyl alcohol   1 drop Right Eye TID  revefenacin   175 mcg Nebulization Daily   Continuous Infusions:  feeding supplement (OSMOLITE 1.2 CAL) 1,000 mL (03/27/24 2123)     LOS: 22 days   Time spent:  Haydee Lipa, DO [facility]  If 7PM-7AM, please contact night-coverage [email or web address]  03/28/2024, 7:01 AM

## 2024-03-29 DIAGNOSIS — Z7189 Other specified counseling: Secondary | ICD-10-CM | POA: Diagnosis not present

## 2024-03-29 DIAGNOSIS — Z515 Encounter for palliative care: Secondary | ICD-10-CM | POA: Diagnosis not present

## 2024-03-29 DIAGNOSIS — G40901 Epilepsy, unspecified, not intractable, with status epilepticus: Secondary | ICD-10-CM | POA: Diagnosis not present

## 2024-03-29 LAB — CBC WITH DIFFERENTIAL/PLATELET
Abs Immature Granulocytes: 0.11 10*3/uL — ABNORMAL HIGH (ref 0.00–0.07)
Basophils Absolute: 0.1 10*3/uL (ref 0.0–0.1)
Basophils Relative: 1 %
Eosinophils Absolute: 0.2 10*3/uL (ref 0.0–0.5)
Eosinophils Relative: 2 %
HCT: 34 % — ABNORMAL LOW (ref 36.0–46.0)
Hemoglobin: 10.5 g/dL — ABNORMAL LOW (ref 12.0–15.0)
Immature Granulocytes: 1 %
Lymphocytes Relative: 11 %
Lymphs Abs: 0.9 10*3/uL (ref 0.7–4.0)
MCH: 28.8 pg (ref 26.0–34.0)
MCHC: 30.9 g/dL (ref 30.0–36.0)
MCV: 93.2 fL (ref 80.0–100.0)
Monocytes Absolute: 0.8 10*3/uL (ref 0.1–1.0)
Monocytes Relative: 10 %
Neutro Abs: 6.2 10*3/uL (ref 1.7–7.7)
Neutrophils Relative %: 75 %
Platelets: 371 10*3/uL (ref 150–400)
RBC: 3.65 MIL/uL — ABNORMAL LOW (ref 3.87–5.11)
RDW: 13.1 % (ref 11.5–15.5)
WBC: 8.4 10*3/uL (ref 4.0–10.5)
nRBC: 0 % (ref 0.0–0.2)

## 2024-03-29 LAB — BASIC METABOLIC PANEL WITH GFR
Anion gap: 9 (ref 5–15)
BUN: 24 mg/dL — ABNORMAL HIGH (ref 8–23)
CO2: 29 mmol/L (ref 22–32)
Calcium: 9.7 mg/dL (ref 8.9–10.3)
Chloride: 99 mmol/L (ref 98–111)
Creatinine, Ser: 0.37 mg/dL — ABNORMAL LOW (ref 0.44–1.00)
GFR, Estimated: >60 mL/min (ref >60)
Glucose, Bld: 139 mg/dL — ABNORMAL HIGH (ref 70–99)
Potassium: 5.1 mmol/L (ref 3.5–5.1)
Sodium: 137 mmol/L (ref 135–145)

## 2024-03-29 LAB — GLUCOSE, CAPILLARY
Glucose-Capillary: 130 mg/dL — ABNORMAL HIGH (ref 70–99)
Glucose-Capillary: 148 mg/dL — ABNORMAL HIGH (ref 70–99)
Glucose-Capillary: 120 mg/dL — ABNORMAL HIGH (ref 70–99)
Glucose-Capillary: 111 mg/dL — ABNORMAL HIGH (ref 70–99)
Glucose-Capillary: 136 mg/dL — ABNORMAL HIGH (ref 70–99)
Glucose-Capillary: 128 mg/dL — ABNORMAL HIGH (ref 70–99)

## 2024-03-29 NOTE — Progress Notes (Signed)
  Palliative Medicine Inpatient Follow Up Note HPI: 89 y.o. female  with past medical history of epilepsy, cognitive deficits, failure to thrive admitted on 03/06/2024 with twitching and right-sided gaze, less responsive.    EMS was called and administered IM Versed  without improvement before establishing IV access and administering an additional Versed  dose IV. There was roughly 45 minutes reported before seizures resolved. She was brought to [facility] ED where she required intubation for airway protection.  Extubated on 4/13 and currently still has ongoing seizure activity.   PMT has been consulted to assist with goals of care conversation in light of failure to thrive and chronic comorbidities.  Today's Discussion 03/29/2024  *Please note that this is a verbal dictation therefore any spelling or grammatical errors are due to the "[employer] One" system interpretation.  Chart reviewed inclusive of vital signs, progress notes, laboratory results, and diagnostic images.   Called patient's daughter and granddaughter for ongoing GOC discussions and palliative support. They share that they have come to a decision today and would like to move forward with PEG. We reviewed the concerning risks associated with a surgical placement and unknown benefits. Counseled on the importance of considering how patient would desire to proceed if she understood her current situation.   They would like to speak more with surgery team regarding length of surgery and length of recovery time. Family is not ready to "just let her go to hospice" and understand that they may be experiencing higher hopes due to love for the patient. It is difficult to see the drastic change in patient's mentation in less than a month. Emotional support and therapeutic listening was provided.  Patient assessed at the bedside. She was resting comfortably. I did not arouse in order to preserve her comfort. Discussed with MD.  Questions and concerns  addressed/Palliative Support Provided.   Objective Assessment: Vital Signs Vitals:   03/29/24 0351 03/29/24 0803  BP: 118/63 (!) 115/57  Pulse: 76 74  Resp: 16 18  Temp: 98.1 F (36.7 C) 98.6 F (37 C)  SpO2: 95% 94%    Gen: Elderly African-American female chronically ill in appearance, sleeping HEENT: Core track in place CV: Regular rate and rhythm PULM: On room air breathing is even and unlabored ABD: soft/nontender EXT: No edema  SUMMARY OF RECOMMENDATIONS   Continue DNR Continue full scope treatment Family has decided to proceed with gastrostomy tube placement Family would consider de-escalation of aggressive care if she declined significantly, if she desired to stop aggressive measures, or if she had complications that worsened her quality of life Ongoing GOC discussions pending clinical course Psychosocial and emotional support provided PMT will continue to follow and support    Ayo Smoak, PA-C [place] Palliative Medicine Team Team Cell Phone: [phone] Please utilize secure chat with additional questions, if there is no response within 30 minutes please call the above phone number  Palliative Medicine Team providers are available by phone from 7am to 7pm daily and can be reached through the team cell phone.  Should this patient require assistance outside of these hours, please call the patient's attending physician.

## 2024-03-29 NOTE — Progress Notes (Signed)
 PROGRESS NOTE    Chloe Wilcox  ZOX:[id number] DOB: 1934/12/21 DOA: 03/06/2024 PCP: Roselind Congo, MD   Brief Narrative:  89 year old female presents from nursing facility requiring [name] to move at baseline.  Past medical history includes COPD not currently on oxygen , history of tobacco abuse, depression, iron deficiency anemia, prior right femoral fracture now status post IM nail.  Patient presents to our facility from nursing facility with seizure-like activity and right sided gaze that responded to Versed  and intubated for airway protection.  PCCM called for admission and neurology called in consult.  Patient now stabilizing, extubated successfully on 4/13 and transferred out to hospitalist team from ICU on Vimpat , gabapentin , phenytoin  and gabapentin  on 4/17.   Assessment & Plan:   Principal Problem:   Status epilepticus (HCC)   Goals of care - Palliative care consulted, meeting 04/30 with family in regards to goals of care given patient's age chronic comorbid conditions and current quality of life has been discussion in regards to palliative care.  Patient remains DNR DNI at this time. - Family now requesting surgical reevaluation for G-tube placement  Acute metabolic encephalopathy/Delirium, ongoing Secondary to status epilepticus, resolved Chronic dementia, mild to moderate -Mental status continues to wax and wane, neurology indicating likely a long-term duration to improve, concerned this may be patient's new baseline -Continue Vimpat , Keppra , phenytoin , gabapentin  -Off Seroquel , continue citalopram  - Reportedly off AEDs prior to hospitalization (November 2024)  Leukocytosis High risk for aspiration pneumonia Transient hypoxia Chest xray unremarkable for overt findings/opacity She remains high risk for aspiration/pneumonia No indication for antibiotics at this time   Dysphagia secondary to encephalopathy and seizures Prior admission 11/2022 severe mid esophageal  dysmotility-supposed to be on dysphagia 1 diet - but was reportedly eating regular food at facility Core track placed 4/14 continues feeds Ensure 237 twice daily, Osmolite 50 Decrease free water  to 100cc q6h given hyponatremia - Cleared for dysphagia 1 but continues pocketing food/high risk for aspiration Continue Flexi-Seal until output diminishes. Downtrending appropriately Family interested in PEG tube placement understanding implications that this would not prevent aspiration or reflux into the GI tract--cannot get placed by IR--gen surgery has been notified about family's wishes to place G-tube laparoscopically.  Continue goals of care discussion ongoing but G-tube placement will assist with disposition and definitive nutritional source.  AKI, prerenal Hypervolemic hyponatremia -Decrease free water  as above 100cc q6h -Follow repeat BMP -Hold ARB irbesartan    Acute on chronic respiratory failure with COPD Previously on oxygen : currently on room air Continue nebs/inhalers (Yupleri, albuterol , brovana )  Essential HTN Continue amlodipine  10 , atorvastatin , PRNs in place hydralazine  metoprolol    DVT prophylaxis: heparin  injection 5,000 Units Start: 03/06/24 2200 SCDs Start: 03/06/24 2155 Code Status:   Code Status: Do not attempt resuscitation (DNR) PRE-ARREST INTERVENTIONS DESIRED Family - [name]  Status is: Inpt  Dispo: The patient is from: SNF ([facility])              Anticipated d/c is to: [facility]               Anticipated d/c date is: 72+h              Patient currently not medically stable for discharge  Consultants:  Palliative, IR, General Surgery  Procedures:  None  Antimicrobials:  None currently   Subjective: No acute issues/events overnight  Objective: Vitals:   03/28/24 1945 03/28/24 2000 03/28/24 2305 03/29/24 0351  BP: 112/63  108/63 118/63  Pulse: 73 74 73 76  Resp: 11 15  16   Temp:   98.5 F (36.9 C) 98.1 F (36.7 C)  TempSrc: Oral  Oral  Axillary  SpO2: 95% 93% 94% 95%  Weight:      Height:        Intake/Output Summary (Last 24 hours) at 03/29/2024 0716 Last data filed at 03/28/2024 1900 Gross per 24 hour  Intake 2240 ml  Output 1300 ml  Net 940 ml   Filed Weights   03/26/24 0706 03/27/24 0323 03/28/24 0300  Weight: 41.1 kg 42.4 kg 42.7 kg    Examination:  General:  Pleasantly resting in bed, No acute distress. HEENT:  Normocephalic atraumatic.  Sclerae nonicteric, noninjected.  Extraocular movements intact bilaterally. Neck:  Without mass or deformity.  Trachea is midline. Lungs:  Clear to auscultate bilaterally without rhonchi, wheeze, or rales. Heart:  Regular rate and rhythm. Abdomen:  Soft, nontender, nondistended. Extremities: Without cyanosis, clubbing, edema.  Data Reviewed: I have personally reviewed following labs and imaging studies  CBC: Recent Labs  Lab 03/23/24 0715 03/24/24 0454 03/25/24 0912 03/26/24 0548 03/27/24 0543  WBC 11.5* 10.7* 11.0* 8.4 8.4  NEUTROABS 9.6* 8.8* 9.2* 6.4 6.5  HGB 9.9* 9.5* 9.7* 9.8* 9.5*  HCT 32.2* 31.3* 31.5* 30.6* 30.1*  MCV 94.2 94.8 94.3 91.9 91.8  PLT 393 408* 399 396 411*   Basic Metabolic Panel: Recent Labs  Lab 03/23/24 0715 03/24/24 0454 03/25/24 0912 03/26/24 0548 03/27/24 0543  NA 139 139 134* 136 133*  K 4.7 4.3 4.6 4.6 4.5  CL 100 100 99 98 96*  CO2 31 30 27 29 30   GLUCOSE 124* 132* 110* 103* 125*  BUN 35* 32* 25* 22 24*  CREATININE 0.34* 0.42* 0.34* 0.36* 0.34*  CALCIUM  10.0 9.7 9.6 9.6 9.3   GFR: Estimated Creatinine Clearance: 34 mL/min (A) (by C-G formula based on SCr of 0.34 mg/dL (L)).  CBG: Recent Labs  Lab 03/28/24 1118 03/28/24 1609 03/28/24 1945 03/28/24 2304 03/29/24 0357  GLUCAP 134* 124* 125* 137* 130*   Radiology Studies: No results found.  Scheduled Meds:  amLODipine   10 mg Per Tube Daily   arformoterol   15 mcg Nebulization BID   aspirin   81 mg Per Tube Daily   atorvastatin   40 mg Per Tube QHS    budesonide  (PULMICORT ) nebulizer solution  0.5 mg Nebulization BID   citalopram   10 mg Per Tube Daily   diphenoxylate -atropine   1 tablet Per Tube QID   feeding supplement  237 mL Per Tube BID BM   free water   100 mL Per Tube Q6H   gabapentin   100 mg Per Tube TID   heparin   5,000 Units Subcutaneous Q8H   lacosamide   100 mg Per Tube BID   levETIRAcetam   750 mg Per Tube BID   multivitamin with minerals  1 tablet Per Tube Daily   nutrition supplement (JUVEN)  1 packet Per Tube BID BM   mouth rinse  15 mL Mouth Rinse 4 times per day   phenytoin   100 mg Per Tube QHS   phenytoin   75 mg Per Tube BID   polyvinyl alcohol   1 drop Right Eye TID   revefenacin   175 mcg Nebulization Daily   Continuous Infusions:  feeding supplement (OSMOLITE 1.2 CAL) 1,000 mL (03/28/24 1648)     LOS: 23 days   Time spent:  Haydee Lipa, DO [facility]  If 7PM-7AM, please contact night-coverage [email or web address]  03/29/2024, 7:16 AM

## 2024-03-30 DIAGNOSIS — G40901 Epilepsy, unspecified, not intractable, with status epilepticus: Secondary | ICD-10-CM | POA: Diagnosis not present

## 2024-03-30 LAB — GLUCOSE, CAPILLARY
Glucose-Capillary: 130 mg/dL — ABNORMAL HIGH (ref 70–99)
Glucose-Capillary: 122 mg/dL — ABNORMAL HIGH (ref 70–99)
Glucose-Capillary: 149 mg/dL — ABNORMAL HIGH (ref 70–99)
Glucose-Capillary: 101 mg/dL — ABNORMAL HIGH (ref 70–99)
Glucose-Capillary: 140 mg/dL — ABNORMAL HIGH (ref 70–99)
Glucose-Capillary: 143 mg/dL — ABNORMAL HIGH (ref 70–99)

## 2024-03-30 MED ORDER — BANATROL TF EN LIQD
60.0000 mL | Freq: Two times a day (BID) | ENTERAL | Status: DC
  Administered 2024-03-30 – 2024-03-31 (×4): 60 mL
  Filled 2024-03-30 (×4): qty 60

## 2024-03-30 NOTE — Plan of Care (Signed)
  Problem: Health Behavior/Discharge Planning: Goal: Ability to manage health-related needs will improve Outcome: Progressing   Problem: Clinical Measurements: Goal: Will remain free from infection Outcome: Progressing Goal: Diagnostic test results will improve Outcome: Progressing Goal: Cardiovascular complication will be avoided Outcome: Progressing   Problem: Activity: Goal: Risk for activity intolerance will decrease Outcome: Progressing   Problem: Nutrition: Goal: Adequate nutrition will be maintained Outcome: Progressing   Problem: Elimination: Goal: Will not experience complications related to bowel motility Outcome: Progressing Goal: Will not experience complications related to urinary retention Outcome: Progressing   Problem: Safety: Goal: Ability to remain free from injury will improve Outcome: Progressing

## 2024-03-30 NOTE — Plan of Care (Signed)

## 2024-03-30 NOTE — Progress Notes (Signed)
 PROGRESS NOTE    Chloe Wilcox  RUE:[id number] DOB: September 04, 1935 DOA: 03/06/2024 PCP: Roselind Congo, MD   Brief Narrative:  89 year old female presents from nursing facility requiring [name] to move at baseline.  Past medical history includes COPD not currently on oxygen , history of tobacco abuse, depression, iron deficiency anemia, prior right femoral fracture now status post IM nail.  Patient presents to our facility from nursing facility with seizure-like activity and right sided gaze that responded to Versed  and intubated for airway protection.  PCCM called for admission and neurology called in consult.  Patient now stabilizing, extubated successfully on 4/13 and transferred out to hospitalist team from ICU on Vimpat , gabapentin , phenytoin  and gabapentin  on 4/17.  Assessment & Plan:   Principal Problem:   Status epilepticus (HCC)   Goals of care - Palliative care consulted, meeting 04/30 with family in regards to goals of care given patient's age chronic comorbid conditions and current quality of life has been discussion in regards to palliative care.  Patient remains DNR DNI at this time. - Family now requesting surgical reevaluation for G-tube placement  Acute metabolic encephalopathy/Delirium, ongoing Secondary to status epilepticus, resolved Chronic dementia, mild to moderate -Mental status continues to wax and wane, neurology indicating likely a long-term duration to improve, concerned this may be patient's new baseline -Continue Vimpat , Keppra , phenytoin , gabapentin  -Off Seroquel , continue citalopram  - Reportedly off AEDs prior to hospitalization (November 2024)  Leukocytosis High risk for aspiration pneumonia Transient hypoxia Chest xray unremarkable for overt findings/opacity She remains high risk for aspiration/pneumonia No indication for antibiotics at this time   Dysphagia secondary to encephalopathy and seizures Prior admission 11/2022 severe mid esophageal  dysmotility-supposed to be on dysphagia 1 diet - but was reportedly eating regular food at facility Core track placed 4/14 continues feeds Ensure 237 twice daily, Osmolite 50 Decrease free water  to 100cc q6h given hyponatremia - Cleared for dysphagia 1 but continues pocketing food/high risk for aspiration Continue Flexi-Seal until output diminishes. Downtrending appropriately Family interested in PEG tube placement understanding implications that this would not prevent aspiration or reflux into the GI tract--cannot get placed by IR--gen surgery has been notified about family's wishes to place G-tube laparoscopically.  Continue goals of care discussion ongoing but G-tube placement will assist with disposition and definitive nutritional source.  AKI, prerenal Hypervolemic hyponatremia -Decrease free water  as above 100cc q6h -Follow repeat BMP -Hold ARB irbesartan    Acute on chronic respiratory failure with COPD Previously on oxygen : currently on room air Continue nebs/inhalers (Yupleri, albuterol , brovana )  Essential HTN Continue amlodipine  10 , atorvastatin , PRNs in place hydralazine  metoprolol    DVT prophylaxis: heparin  injection 5,000 Units Start: 03/06/24 2200 SCDs Start: 03/06/24 2155 Code Status:   Code Status: Do not attempt resuscitation (DNR) PRE-ARREST INTERVENTIONS DESIRED Family - [name]  Status is: Inpt  Dispo: The patient is from: SNF ([facility])              Anticipated d/c is to: [facility]               Anticipated d/c date is: 72+h              Patient currently not medically stable for discharge  Consultants:  Palliative, IR, General Surgery  Procedures:  None  Antimicrobials:  None currently   Subjective: No acute issues/events overnight  Objective: Vitals:   03/29/24 1600 03/29/24 2030 03/29/24 2300 03/30/24 0315  BP: (!) 128/56  (!) 120/57 105/65  Pulse: 86 76 75 69  Resp: (!) 22 19 18 18   Temp: 98.5 F (36.9 C)  99.1 F (37.3 C) 98.3 F  (36.8 C)  TempSrc: Oral  Oral Axillary  SpO2: 93% 96% 95% 92%  Weight:      Height:        Intake/Output Summary (Last 24 hours) at 03/30/2024 0744 Last data filed at 03/29/2024 2345 Gross per 24 hour  Intake --  Output 1050 ml  Net -1050 ml   Filed Weights   03/26/24 0706 03/27/24 0323 03/28/24 0300  Weight: 41.1 kg 42.4 kg 42.7 kg    Examination:  General:  Pleasantly resting in bed, No acute distress. HEENT:  Normocephalic atraumatic.  Sclerae nonicteric, noninjected.  Extraocular movements intact bilaterally. Neck:  Without mass or deformity.  Trachea is midline. Lungs:  Clear to auscultate bilaterally without rhonchi, wheeze, or rales. Heart:  Regular rate and rhythm. Abdomen:  Soft, nontender, nondistended. Extremities: Without cyanosis, clubbing, edema.  Data Reviewed: I have personally reviewed following labs and imaging studies  CBC: Recent Labs  Lab 03/24/24 0454 03/25/24 0912 03/26/24 0548 03/27/24 0543 03/29/24 0705  WBC 10.7* 11.0* 8.4 8.4 8.4  NEUTROABS 8.8* 9.2* 6.4 6.5 6.2  HGB 9.5* 9.7* 9.8* 9.5* 10.5*  HCT 31.3* 31.5* 30.6* 30.1* 34.0*  MCV 94.8 94.3 91.9 91.8 93.2  PLT 408* 399 396 411* 371   Basic Metabolic Panel: Recent Labs  Lab 03/24/24 0454 03/25/24 0912 03/26/24 0548 03/27/24 0543 03/29/24 0705  NA 139 134* 136 133* 137  K 4.3 4.6 4.6 4.5 5.1  CL 100 99 98 96* 99  CO2 30 27 29 30 29   GLUCOSE 132* 110* 103* 125* 139*  BUN 32* 25* 22 24* 24*  CREATININE 0.42* 0.34* 0.36* 0.34* 0.37*  CALCIUM  9.7 9.6 9.6 9.3 9.7   GFR: Estimated Creatinine Clearance: 34 mL/min (A) (by C-G formula based on SCr of 0.37 mg/dL (L)).  CBG: Recent Labs  Lab 03/29/24 1215 03/29/24 1507 03/29/24 1914 03/29/24 2305 03/30/24 0317  GLUCAP 120* 111* 136* 128* 130*   Radiology Studies: No results found.  Scheduled Meds:  amLODipine   10 mg Per Tube Daily   arformoterol   15 mcg Nebulization BID   aspirin   81 mg Per Tube Daily   atorvastatin   40 mg  Per Tube QHS   budesonide  (PULMICORT ) nebulizer solution  0.5 mg Nebulization BID   citalopram   10 mg Per Tube Daily   diphenoxylate -atropine   1 tablet Per Tube QID   feeding supplement  237 mL Per Tube BID BM   free water   100 mL Per Tube Q6H   gabapentin   100 mg Per Tube TID   heparin   5,000 Units Subcutaneous Q8H   lacosamide   100 mg Per Tube BID   levETIRAcetam   750 mg Per Tube BID   multivitamin with minerals  1 tablet Per Tube Daily   nutrition supplement (JUVEN)  1 packet Per Tube BID BM   mouth rinse  15 mL Mouth Rinse 4 times per day   phenytoin   100 mg Per Tube QHS   phenytoin   75 mg Per Tube BID   polyvinyl alcohol   1 drop Right Eye TID   revefenacin   175 mcg Nebulization Daily   Continuous Infusions:  feeding supplement (OSMOLITE 1.2 CAL) 1,000 mL (03/29/24 1450)     LOS: 24 days   Time spent:  Haydee Lipa, DO [facility]  If 7PM-7AM, please contact night-coverage [email or web address]  03/30/2024, 7:44 AM

## 2024-03-31 DIAGNOSIS — G40901 Epilepsy, unspecified, not intractable, with status epilepticus: Secondary | ICD-10-CM | POA: Diagnosis not present

## 2024-03-31 LAB — GLUCOSE, CAPILLARY
Glucose-Capillary: 131 mg/dL — ABNORMAL HIGH (ref 70–99)
Glucose-Capillary: 111 mg/dL — ABNORMAL HIGH (ref 70–99)
Glucose-Capillary: 131 mg/dL — ABNORMAL HIGH (ref 70–99)
Glucose-Capillary: 138 mg/dL — ABNORMAL HIGH (ref 70–99)
Glucose-Capillary: 148 mg/dL — ABNORMAL HIGH (ref 70–99)
Glucose-Capillary: 121 mg/dL — ABNORMAL HIGH (ref 70–99)

## 2024-03-31 NOTE — Plan of Care (Signed)
   Problem: Education: Goal: Knowledge of General Education information will improve Description: Including pain rating scale, medication(s)/side effects and non-pharmacologic comfort measures Outcome: Not Progressing

## 2024-03-31 NOTE — Plan of Care (Signed)
 Problem: Education: Goal: Knowledge of General Education information will improve Description: Including pain rating scale, medication(s)/side effects and non-pharmacologic comfort measures 03/31/2024 0114 by Alejos Husband, RN Outcome: Progressing 03/30/2024 2101 by Alejos Husband, RN Outcome: Progressing   Problem: Health Behavior/Discharge Planning: Goal: Ability to manage health-related needs will improve 03/31/2024 0114 by Alejos Husband, RN Outcome: Progressing 03/30/2024 2101 by Alejos Husband, RN Outcome: Progressing   Problem: Clinical Measurements: Goal: Ability to maintain clinical measurements within normal limits will improve 03/31/2024 0114 by Alejos Husband, RN Outcome: Progressing 03/30/2024 2101 by Alejos Husband, RN Outcome: Progressing Goal: Will remain free from infection 03/31/2024 0114 by Alejos Husband, RN Outcome: Progressing 03/30/2024 2101 by Alejos Husband, RN Outcome: Progressing Goal: Diagnostic test results will improve 03/31/2024 0114 by Alejos Husband, RN Outcome: Progressing 03/30/2024 2101 by Alejos Husband, RN Outcome: Progressing Goal: Respiratory complications will improve 03/31/2024 0114 by Alejos Husband, RN Outcome: Progressing 03/30/2024 2101 by Alejos Husband, RN Outcome: Progressing Goal: Cardiovascular complication will be avoided 03/31/2024 0114 by Alejos Husband, RN Outcome: Progressing 03/30/2024 2101 by Alejos Husband, RN Outcome: Progressing   Problem: Activity: Goal: Risk for activity intolerance will decrease 03/31/2024 0114 by Alejos Husband, RN Outcome: Progressing 03/30/2024 2101 by Alejos Husband, RN Outcome: Progressing   Problem: Nutrition: Goal: Adequate nutrition will be maintained 03/31/2024 0114 by Alejos Husband, RN Outcome: Progressing 03/30/2024 2101 by Alejos Husband, RN Outcome: Progressing   Problem: Coping: Goal: Level of anxiety will decrease 03/31/2024  0114 by Alejos Husband, RN Outcome: Progressing 03/30/2024 2101 by Alejos Husband, RN Outcome: Progressing   Problem: Elimination: Goal: Will not experience complications related to bowel motility 03/31/2024 0114 by Alejos Husband, RN Outcome: Progressing 03/30/2024 2101 by Alejos Husband, RN Outcome: Progressing Goal: Will not experience complications related to urinary retention 03/31/2024 0114 by Alejos Husband, RN Outcome: Progressing 03/30/2024 2101 by Alejos Husband, RN Outcome: Progressing   Problem: Pain Managment: Goal: General experience of comfort will improve and/or be controlled 03/31/2024 0114 by Alejos Husband, RN Outcome: Progressing 03/30/2024 2101 by Alejos Husband, RN Outcome: Progressing   Problem: Safety: Goal: Ability to remain free from injury will improve 03/31/2024 0114 by Alejos Husband, RN Outcome: Progressing 03/30/2024 2101 by Alejos Husband, RN Outcome: Progressing   Problem: Skin Integrity: Goal: Risk for impaired skin integrity will decrease 03/31/2024 0114 by Alejos Husband, RN Outcome: Progressing 03/30/2024 2101 by Alejos Husband, RN Outcome: Progressing   Problem: Education: Goal: Expressions of having a comfortable level of knowledge regarding the disease process will increase 03/31/2024 0114 by Alejos Husband, RN Outcome: Progressing 03/30/2024 2101 by Alejos Husband, RN Outcome: Progressing   Problem: Coping: Goal: Ability to adjust to condition or change in health will improve 03/31/2024 0114 by Alejos Husband, RN Outcome: Progressing 03/30/2024 2101 by Alejos Husband, RN Outcome: Progressing Goal: Ability to identify appropriate support needs will improve 03/31/2024 0114 by Alejos Husband, RN Outcome: Progressing 03/30/2024 2101 by Alejos Husband, RN Outcome: Progressing   Problem: Health Behavior/Discharge Planning: Goal: Compliance with prescribed medication regimen will  improve 03/31/2024 0114 by Alejos Husband, RN Outcome: Progressing 03/30/2024 2101 by Alejos Husband, RN Outcome: Progressing   Problem: Medication: Goal: Risk for medication side effects will decrease 03/31/2024 0114 by Alejos Husband, RN Outcome: Progressing 03/30/2024 2101 by Alejos Husband, RN Outcome: Progressing  Problem: Clinical Measurements: Goal: Complications related to the disease process, condition or treatment will be avoided or minimized 03/31/2024 0114 by Alejos Husband, RN Outcome: Progressing 03/30/2024 2101 by Alejos Husband, RN Outcome: Progressing Goal: Diagnostic test results will improve 03/31/2024 0114 by Alejos Husband, RN Outcome: Progressing 03/30/2024 2101 by Alejos Husband, RN Outcome: Progressing   Problem: Safety: Goal: Verbalization of understanding the information provided will improve 03/31/2024 0114 by Alejos Husband, RN Outcome: Progressing 03/30/2024 2101 by Alejos Husband, RN Outcome: Progressing   Problem: Self-Concept: Goal: Level of anxiety will decrease 03/31/2024 0114 by Alejos Husband, RN Outcome: Progressing 03/30/2024 2101 by Alejos Husband, RN Outcome: Progressing Goal: Ability to verbalize feelings about condition will improve 03/31/2024 0114 by Alejos Husband, RN Outcome: Progressing 03/30/2024 2101 by Alejos Husband, RN Outcome: Progressing

## 2024-03-31 NOTE — Progress Notes (Signed)
 PROGRESS NOTE    Chloe Wilcox  ZOX:[id number] DOB: 27-Jan-1935 DOA: 03/06/2024 PCP: Roselind Congo, MD   Brief Narrative:  89 year old female presents from nursing facility requiring [name] to move at baseline.  Past medical history includes COPD not currently on oxygen , history of tobacco abuse, depression, iron deficiency anemia, prior right femoral fracture now status post IM nail.  Patient presents to our facility from nursing facility with seizure-like activity and right sided gaze that responded to Versed  and intubated for airway protection.  PCCM called for admission and neurology called in consult.  Patient now stabilizing, extubated successfully on 4/13 and transferred out to hospitalist team from ICU on Vimpat , gabapentin , phenytoin  and gabapentin  on 4/17.  Assessment & Plan:   Principal Problem:   Status epilepticus (HCC)   Goals of care - Palliative care consulted, meeting 04/30 with family in regards to goals of care given patient's age chronic comorbid conditions and current quality of life has been discussion in regards to palliative care.  Patient remains DNR DNI at this time. - Family now requesting surgical reevaluation for G-tube placement  Acute metabolic encephalopathy/Delirium, ongoing Secondary to status epilepticus, resolved Chronic dementia, mild to moderate -Mental status continues to wax and wane, neurology indicating likely a long-term duration to improve, concerned this may be patient's new baseline -Continue Vimpat , Keppra , phenytoin , gabapentin  -Off Seroquel , continue citalopram  - Reportedly off AEDs prior to hospitalization (November 2024)  Leukocytosis High risk for aspiration pneumonia Transient hypoxia Chest xray unremarkable for overt findings/opacity She remains high risk for aspiration/pneumonia No indication for antibiotics at this time   Dysphagia secondary to encephalopathy and seizures Prior admission 11/2022 severe mid esophageal  dysmotility-supposed to be on dysphagia 1 diet - but was reportedly eating regular food at facility Core track placed 4/14 continues feeds Ensure 237 twice daily, Osmolite 50 Decrease free water  to 100cc q6h given hyponatremia - Cleared for dysphagia 1 but continues pocketing food/high risk for aspiration Continue Flexi-Seal until output diminishes. Downtrending appropriately Family interested in PEG tube placement understanding implications that this would not prevent aspiration or reflux into the GI tract--cannot get placed by IR--gen surgery has been notified about family's wishes to place G-tube laparoscopically.  Continue goals of care discussion ongoing but G-tube placement will assist with disposition and definitive nutritional source.  AKI, prerenal Hypervolemic hyponatremia -Decrease free water  as above 100cc q6h -Follow repeat BMP -Hold ARB irbesartan    Acute on chronic respiratory failure with COPD Previously on oxygen : currently on room air Continue nebs/inhalers (Yupleri, albuterol , brovana )  Essential HTN Continue amlodipine  10 , atorvastatin , PRNs in place hydralazine  metoprolol    DVT prophylaxis: heparin  injection 5,000 Units Start: 03/06/24 2200 SCDs Start: 03/06/24 2155 Code Status:   Code Status: Do not attempt resuscitation (DNR) PRE-ARREST INTERVENTIONS DESIRED Family - [name]  Status is: Inpt  Dispo: The patient is from: SNF ([facility])              Anticipated d/c is to: [facility]               Anticipated d/c date is: 72+h              Patient currently not medically stable for discharge  Consultants:  Palliative, IR, General Surgery  Procedures:  None  Antimicrobials:  None currently   Subjective: No acute issues/events overnight  Objective: Vitals:   03/30/24 2300 03/31/24 0336 03/31/24 0500 03/31/24 0700  BP: (!) 128/59   (!) 120/50  Pulse:  Resp:      Temp:  99.2 F (37.3 C)  98.3 F (36.8 C)  TempSrc: Axillary Axillary   Axillary  SpO2:      Weight:   44.7 kg   Height:        Intake/Output Summary (Last 24 hours) at 03/31/2024 0802 Last data filed at 03/31/2024 0700 Gross per 24 hour  Intake 180 ml  Output 1550 ml  Net -1370 ml   Filed Weights   03/28/24 0300 03/30/24 0710 03/31/24 0500  Weight: 42.7 kg 44.7 kg 44.7 kg    Examination:  General:  Pleasantly resting in bed, No acute distress. HEENT:  Normocephalic atraumatic.  Sclerae nonicteric, noninjected.  Extraocular movements intact bilaterally. Neck:  Without mass or deformity.  Trachea is midline. Lungs:  Clear to auscultate bilaterally without rhonchi, wheeze, or rales. Heart:  Regular rate and rhythm. Abdomen:  Soft, nontender, nondistended. Extremities: Without cyanosis, clubbing, edema.  Data Reviewed: I have personally reviewed following labs and imaging studies  CBC: Recent Labs  Lab 03/25/24 0912 03/26/24 0548 03/27/24 0543 03/29/24 0705  WBC 11.0* 8.4 8.4 8.4  NEUTROABS 9.2* 6.4 6.5 6.2  HGB 9.7* 9.8* 9.5* 10.5*  HCT 31.5* 30.6* 30.1* 34.0*  MCV 94.3 91.9 91.8 93.2  PLT 399 396 411* 371   Basic Metabolic Panel: Recent Labs  Lab 03/25/24 0912 03/26/24 0548 03/27/24 0543 03/29/24 0705  NA 134* 136 133* 137  K 4.6 4.6 4.5 5.1  CL 99 98 96* 99  CO2 27 29 30 29   GLUCOSE 110* 103* 125* 139*  BUN 25* 22 24* 24*  CREATININE 0.34* 0.36* 0.34* 0.37*  CALCIUM  9.6 9.6 9.3 9.7   GFR: Estimated Creatinine Clearance: 35.6 mL/min (A) (by C-G formula based on SCr of 0.37 mg/dL (L)).  CBG: Recent Labs  Lab 03/30/24 1055 03/30/24 1512 03/30/24 1914 03/30/24 2311 03/31/24 0404  GLUCAP 149* 101* 140* 143* 131*   Radiology Studies: No results found.  Scheduled Meds:  amLODipine   10 mg Per Tube Daily   arformoterol   15 mcg Nebulization BID   aspirin   81 mg Per Tube Daily   atorvastatin   40 mg Per Tube QHS   budesonide  (PULMICORT ) nebulizer solution  0.5 mg Nebulization BID   citalopram   10 mg Per Tube Daily    feeding supplement  237 mL Per Tube BID BM   fiber supplement (BANATROL TF)  60 mL Per Tube BID   free water   100 mL Per Tube Q6H   gabapentin   100 mg Per Tube TID   heparin   5,000 Units Subcutaneous Q8H   lacosamide   100 mg Per Tube BID   levETIRAcetam   750 mg Per Tube BID   multivitamin with minerals  1 tablet Per Tube Daily   nutrition supplement (JUVEN)  1 packet Per Tube BID BM   mouth rinse  15 mL Mouth Rinse 4 times per day   phenytoin   100 mg Per Tube QHS   phenytoin   75 mg Per Tube BID   polyvinyl alcohol   1 drop Right Eye TID   revefenacin   175 mcg Nebulization Daily   Continuous Infusions:  feeding supplement (OSMOLITE 1.2 CAL) 1,000 mL (03/30/24 1204)     LOS: 25 days   Time spent:  Haydee Lipa, DO [facility]  If 7PM-7AM, please contact night-coverage [email or web address]  03/31/2024, 8:02 AM

## 2024-04-01 LAB — BASIC METABOLIC PANEL WITH GFR
Anion gap: 10 (ref 5–15)
BUN: 23 mg/dL (ref 8–23)
CO2: 29 mmol/L (ref 22–32)
Calcium: 10.1 mg/dL (ref 8.9–10.3)
Chloride: 97 mmol/L — ABNORMAL LOW (ref 98–111)
Creatinine, Ser: 0.35 mg/dL — ABNORMAL LOW (ref 0.44–1.00)
GFR, Estimated: >60 mL/min (ref >60)
Glucose, Bld: 111 mg/dL — ABNORMAL HIGH (ref 70–99)
Potassium: 4.5 mmol/L (ref 3.5–5.1)
Sodium: 136 mmol/L (ref 135–145)

## 2024-04-01 LAB — CBC WITH DIFFERENTIAL/PLATELET
Abs Immature Granulocytes: 0.06 10*3/uL (ref 0.00–0.07)
Basophils Absolute: 0.1 10*3/uL (ref 0.0–0.1)
Basophils Relative: 1 %
Eosinophils Absolute: 0.1 10*3/uL (ref 0.0–0.5)
Eosinophils Relative: 1 %
HCT: 32.8 % — ABNORMAL LOW (ref 36.0–46.0)
Hemoglobin: 10.1 g/dL — ABNORMAL LOW (ref 12.0–15.0)
Immature Granulocytes: 1 %
Lymphocytes Relative: 6 %
Lymphs Abs: 0.7 10*3/uL (ref 0.7–4.0)
MCH: 28.4 pg (ref 26.0–34.0)
MCHC: 30.8 g/dL (ref 30.0–36.0)
MCV: 92.1 fL (ref 80.0–100.0)
Monocytes Absolute: 0.8 10*3/uL (ref 0.1–1.0)
Monocytes Relative: 7 %
Neutro Abs: 9.6 10*3/uL — ABNORMAL HIGH (ref 1.7–7.7)
Neutrophils Relative %: 84 %
Platelets: 445 10*3/uL — ABNORMAL HIGH (ref 150–400)
RBC: 3.56 MIL/uL — ABNORMAL LOW (ref 3.87–5.11)
RDW: 13 % (ref 11.5–15.5)
WBC: 11.3 10*3/uL — ABNORMAL HIGH (ref 4.0–10.5)
nRBC: 0 % (ref 0.0–0.2)

## 2024-04-01 LAB — GLUCOSE, CAPILLARY
Glucose-Capillary: 118 mg/dL — ABNORMAL HIGH (ref 70–99)
Glucose-Capillary: 122 mg/dL — ABNORMAL HIGH (ref 70–99)
Glucose-Capillary: 110 mg/dL — ABNORMAL HIGH (ref 70–99)

## 2024-04-01 MED ORDER — LORAZEPAM 2 MG/ML PO CONC: 1.0000 mg | ORAL | Status: DC | PRN

## 2024-04-01 MED ORDER — PHENYTOIN 50 MG PO CHEW
75.0000 mg | CHEWABLE_TABLET | Freq: Two times a day (BID) | ORAL | Status: DC
  Filled 2024-04-01: qty 1.5

## 2024-04-01 MED ORDER — LORAZEPAM 2 MG/ML IJ SOLN: 1.0000 mg | INTRAMUSCULAR | Status: DC | PRN

## 2024-04-01 MED ORDER — MORPHINE SULFATE (CONCENTRATE) 10 MG /0.5 ML PO SOLN: 5.0000 mg | ORAL | Status: DC | PRN

## 2024-04-01 MED ORDER — POLYVINYL ALCOHOL 1.4 % OP SOLN: 1.0000 [drp] | Freq: Four times a day (QID) | OPHTHALMIC | Status: DC | PRN

## 2024-04-01 MED ORDER — TRAZODONE HCL 50 MG PO TABS: 25.0000 mg | ORAL_TABLET | Freq: Every evening | ORAL | Status: DC | PRN

## 2024-04-01 MED ORDER — GLYCOPYRROLATE 0.2 MG/ML IJ SOLN: 0.2000 mg | INTRAMUSCULAR | Status: DC | PRN

## 2024-04-01 MED ORDER — PHENYTOIN SODIUM 50 MG/ML IJ SOLN
75.0000 mg | INTRAMUSCULAR | Status: DC
  Administered 2024-04-01 – 2024-04-02 (×3): 75 mg via INTRAVENOUS
  Filled 2024-04-01 (×3): qty 2

## 2024-04-01 MED ORDER — SODIUM CHLORIDE 0.9 % IV SOLN
100.0000 mg | Freq: Two times a day (BID) | INTRAVENOUS | Status: DC
  Administered 2024-04-01 – 2024-04-02 (×4): 100 mg via INTRAVENOUS
  Filled 2024-04-01 (×5): qty 10

## 2024-04-01 MED ORDER — LEVETIRACETAM 750 MG PO TABS: 750.0000 mg | ORAL_TABLET | Freq: Two times a day (BID) | ORAL | Status: DC

## 2024-04-01 MED ORDER — BIOTENE DRY MOUTH MT LIQD: 15.0000 mL | OROMUCOSAL | Status: DC | PRN

## 2024-04-01 MED ORDER — SODIUM CHLORIDE 0.9 % IV SOLN
750.0000 mg | Freq: Two times a day (BID) | INTRAVENOUS | Status: DC
Start: 2024-04-01 — End: 2024-04-02
  Administered 2024-04-01 (×2): 750 mg via INTRAVENOUS
  Filled 2024-04-01 (×4): qty 7.5

## 2024-04-01 MED ORDER — PHENYTOIN SODIUM 50 MG/ML IJ SOLN
100.0000 mg | Freq: Every day | INTRAMUSCULAR | Status: DC
  Administered 2024-04-01 – 2024-04-02 (×2): 100 mg via INTRAVENOUS
  Filled 2024-04-01 (×2): qty 2

## 2024-04-01 MED ORDER — PHENYTOIN 50 MG PO CHEW
100.0000 mg | CHEWABLE_TABLET | Freq: Every day | ORAL | Status: DC
  Filled 2024-04-01: qty 2

## 2024-04-01 MED ORDER — LOPERAMIDE HCL 2 MG PO CAPS: 2.0000 mg | ORAL_CAPSULE | ORAL | Status: DC | PRN

## 2024-04-01 MED ORDER — HALOPERIDOL 0.5 MG PO TABS: 0.5000 mg | ORAL_TABLET | ORAL | Status: DC | PRN

## 2024-04-01 MED ORDER — BISACODYL 10 MG RE SUPP: 10.0000 mg | Freq: Every day | RECTAL | Status: DC | PRN

## 2024-04-01 MED ORDER — ENSURE ENLIVE PO LIQD: 237.0000 mL | Freq: Two times a day (BID) | ORAL | Status: DC | PRN

## 2024-04-01 MED ORDER — POLYETHYLENE GLYCOL 3350 17 G PO PACK: 17.0000 g | PACK | Freq: Every day | ORAL | Status: DC | PRN

## 2024-04-01 MED ORDER — ONDANSETRON 4 MG PO TBDP: 4.0000 mg | ORAL_TABLET | Freq: Four times a day (QID) | ORAL | Status: DC | PRN

## 2024-04-01 MED ORDER — HALOPERIDOL LACTATE 2 MG/ML PO CONC: 0.5000 mg | ORAL | Status: DC | PRN

## 2024-04-01 MED ORDER — MORPHINE SULFATE (PF) 2 MG/ML IV SOLN
1.0000 mg | INTRAVENOUS | Status: DC | PRN
  Administered 2024-04-01: 1 mg via INTRAVENOUS
  Filled 2024-04-01 (×2): qty 1

## 2024-04-01 MED ORDER — ONDANSETRON HCL 4 MG/2ML IJ SOLN
4.0000 mg | Freq: Four times a day (QID) | INTRAMUSCULAR | Status: DC | PRN
Start: 2024-04-01 — End: 2024-04-03

## 2024-04-01 MED ORDER — HALOPERIDOL LACTATE 5 MG/ML IJ SOLN
0.5000 mg | INTRAMUSCULAR | Status: DC | PRN
  Administered 2024-04-01: 0.5 mg via INTRAVENOUS
  Filled 2024-04-01: qty 1

## 2024-04-01 MED ORDER — LACOSAMIDE 50 MG PO TABS: 100.0000 mg | ORAL_TABLET | Freq: Two times a day (BID) | ORAL | Status: DC

## 2024-04-01 MED ORDER — MORPHINE SULFATE (CONCENTRATE) 10 MG /0.5 ML PO SOLN
5.0000 mg | Freq: Three times a day (TID) | ORAL | Status: DC
  Administered 2024-04-01 – 2024-04-02 (×4): 5 mg via SUBLINGUAL
  Filled 2024-04-01 (×4): qty 0.5

## 2024-04-01 NOTE — Progress Notes (Signed)
 Cortrak removed per order with no complications. Pt tolerated procedure well.

## 2024-04-01 NOTE — Progress Notes (Signed)
 Trauma/Critical Care Follow Up Note  Subjective:    Overnight Issues:   Objective:  Vital signs for last 24 hours: Temp:  [97.8 F (36.6 C)-98.8 F (37.1 C)] 98.1 F (36.7 C) (05/05 0746) Pulse Rate:  [78-86] 78 (05/05 0746) Resp:  [19-20] 19 (05/05 0746) BP: (93-128)/(56-58) 119/57 (05/05 0746) SpO2:  [93 %-95 %] 93 % (05/05 0746) Weight:  [44.7 kg] 44.7 kg (05/05 0442)  Hemodynamic parameters for last 24 hours:    Intake/Output from previous day: 05/04 0701 - 05/05 0700 In: 4180 [NG/GT:4150] Out: 695 [Urine:650; Stool:45]  Intake/Output this shift: No intake/output data recorded.  Vent settings for last 24 hours:    Physical Exam:  Gen: comfortable, no distress Neuro: not following commands HEENT: PERRL Neck: supple CV: RRR Pulm: unlabored breathing on RA Abd: soft, NT   ,    GU: urine clear and yellow, +spontaneous voids Extr: wwp, no edema  Results for orders placed or performed during the hospital encounter of 03/06/24 (from the past 24 hours)  Glucose, capillary     Status: Abnormal   Collection Time: 03/31/24 11:30 AM  Result Value Ref Range   Glucose-Capillary 131 (H) 70 - 99 mg/dL  Glucose, capillary     Status: Abnormal   Collection Time: 03/31/24  3:43 PM  Result Value Ref Range   Glucose-Capillary 138 (H) 70 - 99 mg/dL  Glucose, capillary     Status: Abnormal   Collection Time: 03/31/24  7:35 PM  Result Value Ref Range   Glucose-Capillary 148 (H) 70 - 99 mg/dL  Glucose, capillary     Status: Abnormal   Collection Time: 03/31/24 11:06 PM  Result Value Ref Range   Glucose-Capillary 121 (H) 70 - 99 mg/dL  Glucose, capillary     Status: Abnormal   Collection Time: 04/01/24  4:08 AM  Result Value Ref Range   Glucose-Capillary 118 (H) 70 - 99 mg/dL  Basic metabolic panel with GFR     Status: Abnormal   Collection Time: 04/01/24  6:32 AM  Result Value Ref Range   Sodium 136 135 - 145 mmol/L   Potassium 4.5 3.5 - 5.1 mmol/L   Chloride 97 (L)  98 - 111 mmol/L   CO2 29 22 - 32 mmol/L   Glucose, Bld 111 (H) 70 - 99 mg/dL   BUN 23 8 - 23 mg/dL   Creatinine, Ser 4.03 (L) 0.44 - 1.00 mg/dL   Calcium  10.1 8.9 - 10.3 mg/dL   GFR, Estimated >47 >42 mL/min   Anion gap 10 5 - 15  CBC with Differential/Platelet     Status: Abnormal   Collection Time: 04/01/24  6:32 AM  Result Value Ref Range   WBC 11.3 (H) 4.0 - 10.5 K/uL   RBC 3.56 (L) 3.87 - 5.11 MIL/uL   Hemoglobin 10.1 (L) 12.0 - 15.0 g/dL   HCT 59.5 (L) 63.8 - 75.6 %   MCV 92.1 80.0 - 100.0 fL   MCH 28.4 26.0 - 34.0 pg   MCHC 30.8 30.0 - 36.0 g/dL   RDW 43.3 29.5 - 18.8 %   Platelets 445 (H) 150 - 400 K/uL   nRBC 0.0 0.0 - 0.2 %   Neutrophils Relative % 84 %   Neutro Abs 9.6 (H) 1.7 - 7.7 K/uL   Lymphocytes Relative 6 %   Lymphs Abs 0.7 0.7 - 4.0 K/uL   Monocytes Relative 7 %   Monocytes Absolute 0.8 0.1 - 1.0 K/uL   Eosinophils Relative 1 %  Eosinophils Absolute 0.1 0.0 - 0.5 K/uL   Basophils Relative 1 %   Basophils Absolute 0.1 0.0 - 0.1 K/uL   Immature Granulocytes 1 %   Abs Immature Granulocytes 0.06 0.00 - 0.07 K/uL  Glucose, capillary     Status: Abnormal   Collection Time: 04/01/24  8:25 AM  Result Value Ref Range   Glucose-Capillary 122 (H) 70 - 99 mg/dL    Assessment & Plan: The plan of care was discussed with the bedside nurse for the day, who is in agreement with this plan and no additional concerns were raised.   Present on Admission:  Status epilepticus (HCC)    LOS: 26 days   Additional comments:I reviewed the patient's new clinical lab test results.   and I reviewed the patients new imaging test results.    Dysphagia    This is a Chloe Wilcox with a history of cognitive deficits, FTT, seizures and who was residing in a nursing home and presented with status epileptic seizures and now has encephalopathy.  Patient found to have dysphagia during admission, but was cleared for a D1 diet with high risk of aspiration. We were consulted for surgical placement  of long term feeding access due to inadequate window seen by IR.  Discussions have been held regarding lap assisted G-tube placement and most recent documentation reflects that family interested in furtehr discussions with our team. I called her daughter today to discuss my concerns that risk outweighs benefit in this scenario and in the interest of preserving quality of life that I would recommend PO for comfort with acceptance of aspiration risk. She is in agreement with this plan. Primary team and palliative care who has been engaged were both notified of the updated plan. Surgery to sign off.    Anda Bamberg, MD Trauma & General Surgery Please use [email or web address] to contact on call provider  04/01/2024  *Care during the described time interval was provided by me. I have reviewed this patient's available data, including medical history, events of note, physical examination and test results as part of my evaluation.

## 2024-04-01 NOTE — Plan of Care (Signed)
 Problem: Education: Goal: Knowledge of General Education information will improve Description: Including pain rating scale, medication(s)/side effects and non-pharmacologic comfort measures 04/01/2024 0514 by Alejos Husband, RN Outcome: Progressing 03/31/2024 1932 by Alejos Husband, RN Outcome: Progressing   Problem: Health Behavior/Discharge Planning: Goal: Ability to manage health-related needs will improve 04/01/2024 0514 by Alejos Husband, RN Outcome: Progressing 03/31/2024 1932 by Alejos Husband, RN Outcome: Progressing   Problem: Clinical Measurements: Goal: Ability to maintain clinical measurements within normal limits will improve 04/01/2024 0514 by Alejos Husband, RN Outcome: Progressing 03/31/2024 1932 by Alejos Husband, RN Outcome: Progressing Goal: Will remain free from infection 04/01/2024 0514 by Alejos Husband, RN Outcome: Progressing 03/31/2024 1932 by Alejos Husband, RN Outcome: Progressing Goal: Diagnostic test results will improve 04/01/2024 0514 by Alejos Husband, RN Outcome: Progressing 03/31/2024 1932 by Alejos Husband, RN Outcome: Progressing Goal: Respiratory complications will improve 04/01/2024 0514 by Alejos Husband, RN Outcome: Progressing 03/31/2024 1932 by Alejos Husband, RN Outcome: Progressing Goal: Cardiovascular complication will be avoided 04/01/2024 0514 by Alejos Husband, RN Outcome: Progressing 03/31/2024 1932 by Alejos Husband, RN Outcome: Progressing   Problem: Activity: Goal: Risk for activity intolerance will decrease 04/01/2024 0514 by Alejos Husband, RN Outcome: Progressing 03/31/2024 1932 by Alejos Husband, RN Outcome: Progressing   Problem: Nutrition: Goal: Adequate nutrition will be maintained 04/01/2024 0514 by Alejos Husband, RN Outcome: Progressing 03/31/2024 1932 by Alejos Husband, RN Outcome: Progressing   Problem: Coping: Goal: Level of anxiety will decrease 04/01/2024  0514 by Alejos Husband, RN Outcome: Progressing 03/31/2024 1932 by Alejos Husband, RN Outcome: Progressing   Problem: Elimination: Goal: Will not experience complications related to bowel motility 04/01/2024 0514 by Alejos Husband, RN Outcome: Progressing 03/31/2024 1932 by Alejos Husband, RN Outcome: Progressing Goal: Will not experience complications related to urinary retention 04/01/2024 0514 by Alejos Husband, RN Outcome: Progressing 03/31/2024 1932 by Alejos Husband, RN Outcome: Progressing   Problem: Pain Managment: Goal: General experience of comfort will improve and/or be controlled 04/01/2024 0514 by Alejos Husband, RN Outcome: Progressing 03/31/2024 1932 by Alejos Husband, RN Outcome: Progressing   Problem: Safety: Goal: Ability to remain free from injury will improve 04/01/2024 0514 by Alejos Husband, RN Outcome: Progressing 03/31/2024 1932 by Alejos Husband, RN Outcome: Progressing   Problem: Skin Integrity: Goal: Risk for impaired skin integrity will decrease 04/01/2024 0514 by Alejos Husband, RN Outcome: Progressing 03/31/2024 1932 by Alejos Husband, RN Outcome: Progressing   Problem: Education: Goal: Expressions of having a comfortable level of knowledge regarding the disease process will increase 04/01/2024 0514 by Alejos Husband, RN Outcome: Progressing 03/31/2024 1932 by Alejos Husband, RN Outcome: Progressing   Problem: Coping: Goal: Ability to adjust to condition or change in health will improve 04/01/2024 0514 by Alejos Husband, RN Outcome: Progressing 03/31/2024 1932 by Alejos Husband, RN Outcome: Progressing Goal: Ability to identify appropriate support needs will improve 04/01/2024 0514 by Alejos Husband, RN Outcome: Progressing 03/31/2024 1932 by Alejos Husband, RN Outcome: Progressing   Problem: Health Behavior/Discharge Planning: Goal: Compliance with prescribed medication regimen will  improve 04/01/2024 0514 by Alejos Husband, RN Outcome: Progressing 03/31/2024 1932 by Alejos Husband, RN Outcome: Progressing   Problem: Medication: Goal: Risk for medication side effects will decrease 04/01/2024 0514 by Alejos Husband, RN Outcome: Progressing 03/31/2024 1932 by Alejos Husband, RN Outcome: Progressing  Problem: Clinical Measurements: Goal: Complications related to the disease process, condition or treatment will be avoided or minimized 04/01/2024 0514 by Alejos Husband, RN Outcome: Progressing 03/31/2024 1932 by Alejos Husband, RN Outcome: Progressing Goal: Diagnostic test results will improve 04/01/2024 0514 by Alejos Husband, RN Outcome: Progressing 03/31/2024 1932 by Alejos Husband, RN Outcome: Progressing   Problem: Safety: Goal: Verbalization of understanding the information provided will improve 04/01/2024 0514 by Alejos Husband, RN Outcome: Progressing 03/31/2024 1932 by Alejos Husband, RN Outcome: Progressing   Problem: Self-Concept: Goal: Level of anxiety will decrease 04/01/2024 0514 by Alejos Husband, RN Outcome: Progressing 03/31/2024 1932 by Alejos Husband, RN Outcome: Progressing Goal: Ability to verbalize feelings about condition will improve 04/01/2024 0514 by Alejos Husband, RN Outcome: Progressing 03/31/2024 1932 by Alejos Husband, RN Outcome: Progressing

## 2024-04-01 NOTE — Plan of Care (Signed)
  Problem: Coping: Goal: Level of anxiety will decrease Outcome: Progressing   Problem: Pain Managment: Goal: General experience of comfort will improve and/or be controlled Outcome: Progressing   Problem: Education: Goal: Knowledge of General Education information will improve Description: Including pain rating scale, medication(s)/side effects and non-pharmacologic comfort measures Outcome: Not Progressing   Problem: Activity: Goal: Risk for activity intolerance will decrease Outcome: Not Progressing   Problem: Nutrition: Goal: Adequate nutrition will be maintained Outcome: Not Progressing

## 2024-04-01 NOTE — TOC Progression Note (Addendum)
 Transition of Care [facility]) - Progression Note    Patient Details  Name: Chloe Wilcox MRN: [id number] Date of Birth: [birth year]  [facility] CM/SW Contact  Juliane Och, LCSW Phone Number: 04/01/2024, 1:54 PM  Clinical Narrative:     1:54 PM Per Palliative Care NP, patient's family has decided to pursue IP Hospice and expressed preference in [facility] based on previous experience. CSW consulted [facility] admissions.  3:01 PM Per Shawn of [facility], evaluation is to be completed tomorrow to determine food/drink tolerance. [facility] does not have bed availability at this time. Medical team and patient's daughter made aware.   Expected Discharge Plan: Hospice Medical Facility Barriers to Discharge: Continued Medical Work up, Hospice Bed not available  Expected Discharge Plan and Services In-house Referral: Clinical Social Work   Post Acute Care Choice: Hospice Living arrangements for the past 2 months: Skilled Nursing Facility                                       Social Determinants of Health (SDOH) Interventions SDOH Screenings   Food Insecurity: Patient Unable To Answer (03/07/2024)  Housing: Patient Unable To Answer (03/07/2024)  Transportation Needs: Patient Unable To Answer (03/07/2024)  Utilities: Patient Unable To Answer (03/07/2024)  Social Connections: Patient Unable To Answer (03/07/2024)  Tobacco Use: Medium Risk (03/06/2024)    Readmission Risk Interventions     No data to display

## 2024-04-01 NOTE — Progress Notes (Signed)
 PROGRESS NOTE    Chloe Wilcox  WJX:[id number] DOB: 08/31/1935 DOA: 03/06/2024 PCP: Roselind Congo, MD   Brief Narrative:  89 year old female presents from nursing facility requiring [name] to move at baseline.  Past medical history includes COPD not currently on oxygen , history of tobacco abuse, depression, iron deficiency anemia, prior right femoral fracture now status post IM nail.  Patient presents to our facility from nursing facility with seizure-like activity and right sided gaze that responded to Versed  and intubated for airway protection.  PCCM called for admission and neurology called in consult.  Patient now stabilizing, extubated successfully on 4/13 and transferred out to hospitalist team from ICU on Vimpat , gabapentin , phenytoin  and gabapentin  on 4/17.  Family now agreeable to transition patient to palliative care 04/01/2024.  Up until today plan was to await for recovery of p.o. intake or transition to PEG tube.  PEG tube had been scheduled since last week per family's wishes however now on procedure day family has decided to transition to comfort measures instead of procedure and to avoid any further unnecessary procedures/imaging/labs/etc.  Unclear if patient will transition back to prior facility under hospice or to hospice house.  Appreciate palliative care insight and recommendations  Assessment & Plan:   Principal Problem:   Status epilepticus (HCC)   Goals of care - Transitioning to comfort measures on 04/01/2024 - Appreciate palliative care and general surgery in regards to ongoing family discussion - Tentative disposition pending family decision on location  Acute metabolic encephalopathy/Delirium, ongoing Secondary to status epilepticus, resolved Chronic dementia, mild to moderate -Continue Vimpat , Keppra , phenytoin , gabapentin  as tolerated -if difficulty taking medications will discontinue in regards to above comfort measures  Leukocytosis High risk for  aspiration pneumonia Transient hypoxia No further imaging labs or workup at this time given comfort measures protocol   Dysphagia secondary to encephalopathy and seizures Family decision to discontinue PEG tube placement.  Transitioning to comfort measures as above  AKI, prerenal Hypervolemic hyponatremia - Continue comfort measures, no further labs   Acute on chronic respiratory failure with COPD Currently on room air, continue inhalers for symptom management  Essential HTN No further observed vitals, discontinue p.o. medications unless necessary for comfort   DVT prophylaxis: heparin  injection 5,000 Units Start: 03/06/24 2200 SCDs Start: 03/06/24 2155 Code Status:   Code Status: Do not attempt resuscitation (DNR) PRE-ARREST INTERVENTIONS DESIRED Family - [name]  Status is: Inpt  Dispo: The patient is from: SNF ([facility])              Anticipated d/c is to: To be determined, possibly hospice house              Anticipated d/c date is: Imminent              Patient currently is medically stable for discharge  Consultants:  Palliative, IR, General Surgery  Procedures:  None  Antimicrobials:  None currently   Subjective: No acute issues/events overnight, family agreeable to transition to comfort measures today, will focus on goals of care and discharge location  Objective: Vitals:   03/31/24 1925 03/31/24 2302 04/01/24 0400 04/01/24 0442  BP: (!) 93/57     Pulse:      Resp: 20     Temp: 97.8 F (36.6 C) 98 F (36.7 C) 98.8 F (37.1 C)   TempSrc: Oral Oral Oral   SpO2:      Weight:    44.7 kg  Height:        Intake/Output  Summary (Last 24 hours) at 04/01/2024 0719 Last data filed at 03/31/2024 2000 Gross per 24 hour  Intake 4179.99 ml  Output 695 ml  Net 3484.99 ml   Filed Weights   03/30/24 0710 03/31/24 0500 04/01/24 0442  Weight: 44.7 kg 44.7 kg 44.7 kg    Examination:  General:  Pleasantly resting in bed, No acute distress. HEENT:  Normocephalic  atraumatic.  Sclerae nonicteric, noninjected.  Extraocular movements intact bilaterally.  NG clean dry intact Neck:  Without mass or deformity.  Trachea is midline. Lungs:  Clear to auscultate bilaterally without rhonchi, wheeze, or rales. Heart:  Regular rate and rhythm. Abdomen:  Soft, nontender, nondistended. Extremities: Without cyanosis, clubbing, edema.  Data Reviewed: I have personally reviewed following labs and imaging studies  CBC: Recent Labs  Lab 03/25/24 0912 03/26/24 0548 03/27/24 0543 03/29/24 0705  WBC 11.0* 8.4 8.4 8.4  NEUTROABS 9.2* 6.4 6.5 6.2  HGB 9.7* 9.8* 9.5* 10.5*  HCT 31.5* 30.6* 30.1* 34.0*  MCV 94.3 91.9 91.8 93.2  PLT 399 396 411* 371   Basic Metabolic Panel: Recent Labs  Lab 03/25/24 0912 03/26/24 0548 03/27/24 0543 03/29/24 0705  NA 134* 136 133* 137  K 4.6 4.6 4.5 5.1  CL 99 98 96* 99  CO2 27 29 30 29   GLUCOSE 110* 103* 125* 139*  BUN 25* 22 24* 24*  CREATININE 0.34* 0.36* 0.34* 0.37*  CALCIUM  9.6 9.6 9.3 9.7   GFR: Estimated Creatinine Clearance: 35.6 mL/min (A) (by C-G formula based on SCr of 0.37 mg/dL (L)).  CBG: Recent Labs  Lab 03/31/24 1130 03/31/24 1543 03/31/24 1935 03/31/24 2306 04/01/24 0408  GLUCAP 131* 138* 148* 121* 118*   Radiology Studies: No results found.  Scheduled Meds:  amLODipine   10 mg Per Tube Daily   arformoterol   15 mcg Nebulization BID   aspirin   81 mg Per Tube Daily   atorvastatin   40 mg Per Tube QHS   budesonide  (PULMICORT ) nebulizer solution  0.5 mg Nebulization BID   citalopram   10 mg Per Tube Daily   feeding supplement  237 mL Per Tube BID BM   fiber supplement (BANATROL TF)  60 mL Per Tube BID   free water   100 mL Per Tube Q6H   gabapentin   100 mg Per Tube TID   heparin   5,000 Units Subcutaneous Q8H   lacosamide   100 mg Per Tube BID   levETIRAcetam   750 mg Per Tube BID   multivitamin with minerals  1 tablet Per Tube Daily   nutrition supplement (JUVEN)  1 packet Per Tube BID BM    mouth rinse  15 mL Mouth Rinse 4 times per day   phenytoin   100 mg Per Tube QHS   phenytoin   75 mg Per Tube BID   polyvinyl alcohol   1 drop Right Eye TID   revefenacin   175 mcg Nebulization Daily   Continuous Infusions:  feeding supplement (OSMOLITE 1.2 CAL) 1,000 mL (03/31/24 1325)     LOS: 26 days   Time spent:  Haydee Lipa, DO [facility]  If 7PM-7AM, please contact night-coverage [email or web address]  04/01/2024, 7:19 AM

## 2024-04-01 NOTE — Progress Notes (Signed)
 Palliative Medicine Inpatient Follow Up Note HPI: 89 y.o. female  with past medical history of epilepsy, cognitive deficits, failure to thrive admitted on 03/06/2024 with twitching and right-sided gaze, less responsive.    EMS was called and administered IM Versed  without improvement before establishing IV access and administering an additional Versed  dose IV. There was roughly 45 minutes reported before seizures resolved. She was brought to [facility] ED where she required intubation for airway protection.  Extubated on 4/13 and currently still has ongoing seizure activity.   PMT has been consulted to assist with goals of care conversation in light of failure to thrive and chronic comorbidities.  Today's Discussion 04/01/2024  *Please note that this is a verbal dictation therefore any spelling or grammatical errors are due to the "[employer]" system interpretation.  Chart reviewed inclusive of vital signs, progress notes, laboratory results, and diagnostic images. Spoke with surgery MD and primary attending, RN. Received update that family is now requested for comfort focused care and visited the bedside to meet with patient's two nieces. Patient assessed and appears restless and in pain, though she denies any distressing symptoms. Cortrak and mittens in place.  Family confirms that they would like to focus on her comfort and transition to hospice. Explained comfort care, reviewing that patient would no longer receive aggressive medical interventions such as continuous vital signs, lab work, radiology testing, or medications not focused on comfort. All care would focus on how the patient is looking and feeling. This would include management of any symptoms that may cause discomfort, pain, shortness of breath, cough, nausea, agitation, anxiety, and/or secretions etc. Symptoms would be managed with medications and other non-pharmacological interventions such as spiritual support if requested,  repositioning, music therapy, or therapeutic listening. Family verbalized understanding and appreciation. They have prior experience with [facility] and request a referral. We discussed eligibility criteria and possibility of other disposition including LTC with hospice.  Called patient's daughter and granddaughter to review the above information. They verbalized their understanding and agreement.   Questions and concerns addressed/Palliative Support Provided.   Objective Assessment: Vital Signs Vitals:   04/01/24 0746 04/01/24 1156  BP: (!) 119/57 (!) 125/59  Pulse: 78 85  Resp: 19 20  Temp: 98.1 F (36.7 C) 98 F (36.7 C)  SpO2: 93% 95%    Gen: Elderly African-American female chronically ill in appearance HEENT: Cortrak in place CV: Regular rate and rhythm PULM: On room air breathing is even and unlabored ABD: soft/nontender EXT: No edema Psych: anxious  SUMMARY OF RECOMMENDATIONS   Continue DNR Transition to comfort focused care today Morphine TID and PRN for pain/air hunger/comfort Robinul PRN for excessive secretions Ativan  PRN for agitation/anxiety Zofran  PRN for nausea Liquifilm tears PRN for dry eyes Haldol  PRN for agitation/anxiety May have comfort feeding Comfort cart for family Unrestricted visitations in the setting of EOL (per policy) Oxygen  PRN 2L or less for comfort. No escalation.   TOC consulted for referral to hospice facility, assistance appreciated Psychosocial and emotional support provided PMT will continue to follow and support    Total time: 65 minutes  Jariya Reichow, PA-C [facility] Team Cell Phone: [phone] Please utilize secure chat with additional questions, if there is no response within 30 minutes please call the above phone number  Palliative Medicine Team providers are available by phone from 7am to 7pm daily and can be reached through the team cell phone.  Should this patient require assistance  outside of these hours,  please call the patient's attending physician.

## 2024-04-02 DIAGNOSIS — Z515 Encounter for palliative care: Secondary | ICD-10-CM | POA: Diagnosis not present

## 2024-04-02 DIAGNOSIS — Z7189 Other specified counseling: Secondary | ICD-10-CM | POA: Diagnosis not present

## 2024-04-02 MED ORDER — MORPHINE SULFATE (PF) 2 MG/ML IV SOLN
1.0000 mg | INTRAVENOUS | Status: DC | PRN
  Administered 2024-04-02 (×2): 1 mg via INTRAVENOUS
  Filled 2024-04-02 (×2): qty 1

## 2024-04-02 MED ORDER — LACOSAMIDE 10 MG/ML PO SOLN
100.0000 mg | Freq: Two times a day (BID) | ORAL | 0 refills | Status: AC
Start: 2024-04-02 — End: ?

## 2024-04-02 MED ORDER — HALOPERIDOL LACTATE 2 MG/ML PO CONC: 1.0000 mg | ORAL | 0 refills | Status: AC | PRN

## 2024-04-02 MED ORDER — MORPHINE SULFATE (CONCENTRATE) 10 MG /0.5 ML PO SOLN: 5.0000 mg | ORAL | 0 refills | Status: AC | PRN

## 2024-04-02 MED ORDER — LEVETIRACETAM 100 MG/ML PO SOLN: 750.0000 mg | Freq: Two times a day (BID) | ORAL | 0 refills | Status: AC

## 2024-04-02 MED ORDER — TRAZODONE HCL 50 MG PO TABS: 25.0000 mg | ORAL_TABLET | Freq: Every evening | ORAL | 0 refills | Status: AC | PRN

## 2024-04-02 MED ORDER — LORAZEPAM 2 MG/ML PO CONC: 1.0000 mg | ORAL | 0 refills | Status: AC | PRN

## 2024-04-02 MED ORDER — LOPERAMIDE HCL 2 MG PO CAPS: 2.0000 mg | ORAL_CAPSULE | ORAL | 0 refills | Status: AC | PRN

## 2024-04-02 MED ORDER — ONDANSETRON 4 MG PO TBDP: 4.0000 mg | ORAL_TABLET | Freq: Four times a day (QID) | ORAL | 0 refills | Status: AC | PRN

## 2024-04-02 MED ORDER — PHENYTOIN 125 MG/5ML PO SUSP: ORAL | 0 refills | Status: AC

## 2024-04-02 MED ORDER — POLYVINYL ALCOHOL 1.4 % OP SOLN: 1.0000 [drp] | Freq: Three times a day (TID) | OPHTHALMIC | 0 refills | Status: AC

## 2024-04-02 MED ORDER — LEVETIRACETAM (KEPPRA) 500 MG/5 ML ADULT IV PUSH
750.0000 mg | Freq: Two times a day (BID) | INTRAVENOUS | Status: DC
  Administered 2024-04-02: 750 mg via INTRAVENOUS
  Filled 2024-04-02: qty 10

## 2024-04-02 MED ORDER — BIOTENE DRY MOUTH MT LIQD: 15.0000 mL | OROMUCOSAL | 0 refills | Status: AC | PRN

## 2024-04-02 MED ORDER — POLYVINYL ALCOHOL 1.4 % OP SOLN: 1.0000 [drp] | Freq: Four times a day (QID) | OPHTHALMIC | 0 refills | Status: AC | PRN

## 2024-04-02 NOTE — Progress Notes (Deleted)
 PROGRESS NOTE    Chloe Wilcox  ZOX:[id number] DOB: 02-16-1935 DOA: 03/06/2024 PCP: Roselind Congo, MD   Brief Narrative:  89 year old female presents from nursing facility requiring [name] to move at baseline.  Past medical history includes COPD not currently on oxygen , history of tobacco abuse, depression, iron deficiency anemia, prior right femoral fracture now status post IM nail.  Patient presents to our facility from nursing facility with seizure-like activity and right sided gaze that responded to Versed  and intubated for airway protection.  PCCM called for admission and neurology called in consult.  Patient now stabilizing, extubated successfully on 4/13 and transferred out to hospitalist team from ICU on Vimpat , gabapentin , phenytoin  and gabapentin  on 4/17.  Family now agreeable to transition patient to palliative care 04/01/2024.  Up until today plan was to await for recovery of p.o. intake or transition to PEG tube.  PEG tube had been scheduled since last week per family's wishes however now on procedure day family has decided to transition to comfort measures instead of procedure and to avoid any further unnecessary procedures/imaging/labs/etc.  Unclear if patient will transition back to prior facility under hospice or to hospice house.  Appreciate palliative care insight and recommendations  Assessment & Plan:   Principal Problem:   Status epilepticus (HCC)   Goals of care - Transitioning to comfort measures on 04/01/2024 - Appreciate palliative care and general surgery in regards to ongoing family discussion - Tentative disposition pending family decision on location  Acute metabolic encephalopathy/Delirium, ongoing Secondary to status epilepticus, resolved Chronic dementia, mild to moderate -Continue Vimpat , Keppra , phenytoin , gabapentin  as tolerated -if difficulty taking medications will discontinue in regards to above comfort measures  Leukocytosis High risk for  aspiration pneumonia Transient hypoxia No further imaging labs or workup at this time given comfort measures protocol   Dysphagia secondary to encephalopathy and seizures Family decision to discontinue PEG tube placement.  Transitioning to comfort measures as above  AKI, prerenal Hypervolemic hyponatremia - Continue comfort measures, no further labs   Acute on chronic respiratory failure with COPD Currently on room air, continue inhalers for symptom management  Essential HTN No further observed vitals, discontinue p.o. medications unless necessary for comfort   DVT prophylaxis:  Code Status:   Code Status: Do not attempt resuscitation (DNR) - Comfort care Family - [name]  Status is: Inpt  Dispo: The patient is from: SNF ([facility])              Anticipated d/c is to: To be determined, possibly hospice house              Anticipated d/c date is: Imminent              Patient currently is medically stable for discharge  Consultants:  Palliative, IR, General Surgery  Procedures:  None  Antimicrobials:  None currently   Subjective: No acute issues/events overnight, family agreeable to transition to comfort measures today, will focus on goals of care and discharge location  Objective: Vitals:   04/01/24 0746 04/01/24 1156 04/01/24 2250 04/02/24 0036  BP: (!) 119/57 (!) 125/59 126/64   Pulse: 78 85 89 84  Resp: 19 20    Temp: 98.1 F (36.7 C) 98 F (36.7 C) 98 F (36.7 C)   TempSrc: Axillary Oral Oral   SpO2: 93% 95% (!) 89% (!) 88%  Weight:      Height:        Intake/Output Summary (Last 24 hours) at 04/02/2024 0715 Last  data filed at 04/02/2024 0348 Gross per 24 hour  Intake 286 ml  Output 500 ml  Net -214 ml   Filed Weights   03/30/24 0710 03/31/24 0500 04/01/24 0442  Weight: 44.7 kg 44.7 kg 44.7 kg    Examination:  General:  Pleasantly resting in bed, No acute distress. HEENT:  Normocephalic atraumatic.  Sclerae nonicteric, noninjected.  Extraocular  movements intact bilaterally.  NG clean dry intact Neck:  Without mass or deformity.  Trachea is midline. Lungs:  Clear to auscultate bilaterally without rhonchi, wheeze, or rales. Heart:  Regular rate and rhythm. Abdomen:  Soft, nontender, nondistended. Extremities: Without cyanosis, clubbing, edema. Skin: Sacral wound 03/20/24   Data Reviewed: I have personally reviewed following labs and imaging studies  CBC: Recent Labs  Lab 03/27/24 0543 03/29/24 0705 04/01/24 0632  WBC 8.4 8.4 11.3*  NEUTROABS 6.5 6.2 9.6*  HGB 9.5* 10.5* 10.1*  HCT 30.1* 34.0* 32.8*  MCV 91.8 93.2 92.1  PLT 411* 371 445*   Basic Metabolic Panel: Recent Labs  Lab 03/27/24 0543 03/29/24 0705 04/01/24 0632  NA 133* 137 136  K 4.5 5.1 4.5  CL 96* 99 97*  CO2 30 29 29   GLUCOSE 125* 139* 111*  BUN 24* 24* 23  CREATININE 0.34* 0.37* 0.35*  CALCIUM  9.3 9.7 10.1   GFR: Estimated Creatinine Clearance: 35.6 mL/min (A) (by C-G formula based on SCr of 0.35 mg/dL (L)).  CBG: Recent Labs  Lab 03/31/24 1935 03/31/24 2306 04/01/24 0408 04/01/24 0825 04/01/24 1154  GLUCAP 148* 121* 118* 122* 110*   Radiology Studies: No results found.  Scheduled Meds:  arformoterol   15 mcg Nebulization BID   budesonide  (PULMICORT ) nebulizer solution  0.5 mg Nebulization BID   free water   100 mL Per Tube Q6H   morphine CONCENTRATE  5 mg Sublingual TID   mouth rinse  15 mL Mouth Rinse 4 times per day   phenytoin  (DILANTIN ) IV  100 mg Intravenous QHS   phenytoin  (DILANTIN ) IV  75 mg Intravenous 2 times per day   polyvinyl alcohol   1 drop Right Eye TID   revefenacin   175 mcg Nebulization Daily   Continuous Infusions:  lacosamide  (VIMPAT ) IV 100 mg (04/01/24 2125)   levETIRAcetam  750 mg (04/01/24 2232)     LOS: 27 days   Time spent:  Haydee Lipa, DO [facility]  If 7PM-7AM, please contact night-coverage [email or web address]  04/02/2024, 7:15 AM

## 2024-04-02 NOTE — Discharge Summary (Signed)
 Physician Discharge Summary  Chloe Wilcox:[id number] DOB: Feb 03, 1935 DOA: 03/06/2024  PCP: Roselind Congo, MD  Admit date: 03/06/2024 Discharge date: 04/02/2024  Admitted From: Home Disposition: Hospice house  Discharge Condition: Poor CODE STATUS: DNR, currently on comfort measures Diet recommendation: As tolerated, family aware of aspiration risk  Brief/Interim Summary: 89 year old female presents from nursing facility requiring [name] to move at baseline.  Past medical history includes COPD not currently on oxygen , history of tobacco abuse, depression, iron deficiency anemia, prior right femoral fracture now status post IM nail.   Patient presents to our facility from nursing facility with seizure-like activity and right sided gaze that responded to Versed  and intubated for airway protection.  PCCM called for admission and neurology called in consult.  Patient now stabilizing, extubated successfully on 4/13 and transferred out to hospitalist team from ICU on Vimpat , gabapentin , phenytoin  and gabapentin  on 4/17.   Family now agreeable to transition patient to palliative care 04/01/2024.  Up until 5/5 plan was to await for recovery of p.o. intake or transition to PEG tube.  PEG tube had been scheduled since last week per family's wishes however now on procedure day family has decided to transition to comfort measures instead of procedure and to avoid any further unnecessary procedures/imaging/labs/etc.   Patient currently cleared to discharge to hospice house, [facility], awaiting transport but otherwise stable for discharge.  Family aware of plan and agreeable.  Discharge Diagnoses:  Principal Problem:   Status epilepticus (HCC)   Goals of care - Transitioning to comfort measures on 04/01/2024 - Appreciate palliative care and general surgery in regards to ongoing family discussion - Discharge to hospice house as above   Acute metabolic encephalopathy/Delirium, ongoing Secondary to  status epilepticus, resolved Chronic dementia, mild to moderate -Continue Vimpat , Keppra , phenytoin , gabapentin  as tolerated   Leukocytosis High risk for aspiration pneumonia Transient hypoxia No further imaging labs or workup at this time given comfort measures protocol   Dysphagia secondary to encephalopathy and seizures Family decision to discontinue PEG tube placement.  Transitioning to comfort measures as above   AKI, prerenal Hypervolemic hyponatremia - Continue comfort measures, no further labs   Acute on chronic respiratory failure with COPD Currently on room air, continue inhalers for symptom management   Essential HTN No further observed vitals, discontinue p.o. medications unless necessary for comfort  Discharge Instructions  Discharge Instructions     Ambulatory referral to Neurology   Complete by: As directed    An appointment is requested in approximately: 8-12 weeks      Allergies as of 04/02/2024   No Known Allergies      Medication List     STOP taking these medications    acetaminophen  325 MG tablet Commonly known as: TYLENOL    albuterol  (2.5 MG/3ML) 0.083% nebulizer solution Commonly known as: PROVENTIL    aspirin  EC 81 MG tablet   Breztri  Aerosphere 160-9-4.8 MCG/ACT Aero inhaler Generic drug: budeson-glycopyrrolate-formoterol    Cholecalciferol 125 MCG (5000 UT) Tabs   docusate sodium  100 MG capsule Commonly known as: COLACE   feeding supplement Liqd   ipratropium-albuterol  0.5-2.5 (3) MG/3ML Soln Commonly known as: DUONEB   levETIRAcetam  500 MG tablet Commonly known as: KEPPRA  Replaced by: levETIRAcetam  100 MG/ML solution   olmesartan  20 MG tablet Commonly known as: BENICAR    traMADol  50 MG tablet Commonly known as: ULTRAM        TAKE these medications    antiseptic oral rinse Liqd Apply 15 mLs topically as needed for dry mouth.  citalopram  10 MG tablet Commonly known as: CELEXA  Take 1 tablet (10 mg total) by mouth  daily.   haloperidol  2 MG/ML solution Commonly known as: HALDOL  Place 0.5 mLs (1 mg total) under the tongue every 4 (four) hours as needed for agitation (or delirium).   lacosamide  10 MG/ML oral solution Commonly known as: Vimpat  Take 10 mLs (100 mg total) by mouth 2 (two) times daily.   levETIRAcetam  100 MG/ML solution Commonly known as: KEPPRA  Take 7.5 mLs (750 mg total) by mouth 2 (two) times daily. Replaces: levETIRAcetam  500 MG tablet   loperamide 2 MG capsule Commonly known as: IMODIUM Take 1 capsule (2 mg total) by mouth every 2 (two) hours as needed for diarrhea or loose stools.   LORazepam  2 MG/ML concentrated solution Commonly known as: ATIVAN  Place 0.5 mLs (1 mg total) under the tongue every 4 (four) hours as needed for anxiety.   morphine CONCENTRATE 10 mg / 0.5 ml concentrated solution Take 0.25 mLs (5 mg total) by mouth every 2 (two) hours as needed for moderate pain (pain score 4-6) (or dyspnea).   ondansetron  4 MG disintegrating tablet Commonly known as: ZOFRAN -ODT Take 1 tablet (4 mg total) by mouth every 6 (six) hours as needed for nausea.   phenytoin  125 MG/5ML suspension Commonly known as: DILANTIN  Take 4 mLs (100 mg total) by mouth at bedtime AND 3 mLs (75 mg total) 2 (two) times daily.   polyvinyl alcohol  1.4 % ophthalmic solution Commonly known as: LIQUIFILM TEARS Place 1 drop into both eyes 4 (four) times daily as needed for dry eyes.   polyvinyl alcohol  1.4 % ophthalmic solution Commonly known as: LIQUIFILM TEARS Place 1 drop into the right eye 3 (three) times daily.   traZODone 50 MG tablet Commonly known as: DESYREL Take 0.5 tablets (25 mg total) by mouth at bedtime as needed for sleep.        No Known Allergies  Consultations: General Surgery, IR, palliative care   Procedures/Studies: DG CHEST PORT 1 VIEW Result Date: 03/26/2024 CLINICAL DATA:  Pneumonia. EXAM: PORTABLE CHEST 1 VIEW COMPARISON:  March 20, 2024. FINDINGS: The heart  size and mediastinal contours are within normal limits. Both lungs are clear. Feeding tube is seen entering stomach. Degenerative changes are seen involving both glenohumeral joints. IMPRESSION: No active disease. Electronically Signed   By: Rosalene Colon M.D.   On: 03/26/2024 16:55   CT ABDOMEN PELVIS WO CONTRAST Result Date: 03/25/2024 CLINICAL DATA:  Seizure, dysphagia and evaluation for possible percutaneous gastrostomy tube placement for nutrition. EXAM: CT ABDOMEN AND PELVIS WITHOUT CONTRAST TECHNIQUE: Multidetector CT imaging of the abdomen and pelvis was performed following the standard protocol without IV contrast. RADIATION DOSE REDUCTION: This exam was performed according to the departmental dose-optimization program which includes automated exposure control, adjustment of the mA and/or kV according to patient size and/or use of iterative reconstruction technique. COMPARISON:  Prior CT of the abdomen on 08/03/2017 FINDINGS: Lower chest: Bibasilar atelectasis, right greater than left. No hiatal hernia. Hepatobiliary: No focal liver abnormality is seen. No gallstones, gallbladder wall thickening, or biliary dilatation. Pancreas: Unremarkable. No pancreatic ductal dilatation or surrounding inflammatory changes. Spleen: Normal in size without focal abnormality. Adrenals/Urinary Tract: No adrenal masses or hydronephrosis. Several left-sided renal cysts present with the largest measuring 6.5 cm and demonstrating simple fluid densely internally. The bladder is unremarkable. Stomach/Bowel: Feeding tube enters the stomach and terminates in the distal stomach. The stomach is very high in position within the left upper quadrant  and is completely posterior to the splenic flexure of the colon as well as the distal transverse colon and tip of the left lobe of the liver with no adequate percutaneous window between the abdominal wall for safe gastrostomy tube placement. There is no evidence of significant ileus or  bowel obstruction. No free intraperitoneal air identified. No evidence of acute inflammatory process involving bowel. Rectal tube in the rectum. Vascular/Lymphatic: Atherosclerosis of the abdominal aorta without aneurysm. No enlarged lymph nodes identified. Reproductive: Heavily calcified enlarged uterus likely relates to degenerated fibroid tissue. Other: No hernias, ascites or focal abscess identified. Musculoskeletal: Leftward convex scoliosis of the lumbar spine. IMPRESSION: 1. The stomach is very high in position within the left upper quadrant and is completely posterior to the splenic flexure of the colon as well as the distal transverse colon and tip of the left lobe of the liver with no adequate percutaneous window between the abdominal wall for safe gastrostomy tube placement. The patient is not a candidate for percutaneous gastrostomy tube placement. 2. Bibasilar atelectasis, right greater than left. 3. Atherosclerosis of the abdominal aorta without aneurysm. 4. Heavily calcified enlarged uterus likely relates to degenerated fibroid tissue. 5. Leftward convex scoliosis of the lumbar spine. 6. Left-sided renal cysts with the largest measuring 6.5 cm and demonstrating simple fluid densely internally. Electronically Signed   By: Erica Hau M.D.   On: 03/25/2024 13:30   DG CHEST PORT 1 VIEW Result Date: 03/20/2024 CLINICAL DATA:  Pneumonia. EXAM: PORTABLE CHEST 1 VIEW COMPARISON:  Chest radiograph dated 03/06/2024. FINDINGS: Feeding tube extends below the diaphragm with tip beyond the inferior margin of the image. Mild bibasilar atelectasis. No focal consolidation, pleural effusion or pneumothorax. Stable cardiac silhouette. Atherosclerotic calcification of the aorta. No acute osseous pathology. IMPRESSION: Mild bibasilar atelectasis. No focal consolidation. Electronically Signed   By: Angus Bark M.D.   On: 03/20/2024 17:32   MR BRAIN WO CONTRAST Result Date: 03/08/2024 CLINICAL DATA:   Provided history: Seizure, new onset, no history of trauma. EXAM: MRI HEAD WITHOUT CONTRAST TECHNIQUE: Multiplanar, multiecho pulse sequences of the brain and surrounding structures were obtained without intravenous contrast. COMPARISON:  Head CT 03/06/2024.  Brain MRI 10/12/2023. FINDINGS: The patient was unable to tolerate the full examination. Coronal T2 and FLAIR seizure protocol sequences through the hippocampi could not be acquired. The acquired sequences are intermittently motion degraded. Most notably, the sagittal T1 sequences are moderate-to-severely motion degraded, the axial FLAIR sequence is moderately motion degraded and the coronal T2 sequence is moderate-to-severely motion degraded. Within this limitation, findings are as follows. Brain: Mild-to-moderate generalized cerebral atrophy. Chronic cortical/subcortical left MCA territory infarct again demonstrated within the mid and posterior left frontal lobe, left insula and left parietal lobe. Adjacent region of left frontoparietal/insular cortical restricted diffusion, spanning 3.5 cm, which may reflect seizure-related signal changes or an acute left MCA territory infarct. Subtle restricted diffusion also present within the left thalamus, which may reflect seizure-related changes or an acute infarct. Moderate multifocal T2 FLAIR hyperintense signal abnormality elsewhere within the cerebral white matter, nonspecific but compatible chronic small vessel ischemic disease. Unchanged small curvilinear foci of susceptibility-weighted signal loss within/along the posterior right frontal lobe and left frontoparietal convexity. Mild chronic small ischemic changes within the pons. Small chronic infarcts within the bilateral cerebellar hemispheres, including a new small chronic infarct within the left cerebellar hemisphere. No evidence of an intracranial mass. No extra-axial fluid collection. No midline shift. Vascular: Maintained flow voids within the proximal  large arterial vessels.  Skull and upper cervical spine: No focal worrisome marrow lesion. Sinuses/Orbits: No mass or acute finding within the imaged orbits. Prior but ocular lens replacement. No significant paranasal sinus disease. Impressions #2 and #3 will be called to the ordering clinician or representative by the Radiologist Assistant, and communication documented in the PACS or [employer]. IMPRESSION: 1. Motion degraded and prematurely terminated examination as described. Within this limitation, findings are as follows. 2. Chronic cortical/subcortical left MCA territory infarct again demonstrated within the mid and posterior left frontal lobe, left insula and left parietal lobe. Adjacent region of left frontoparietal/insular cortical restricted diffusion, spanning 3.5 cm, which may reflect seizure-related signal changes or an acute left MCA territory infarct. 3. Subtle restricted diffusion also present within the left thalamus, which may reflect seizure-related changes or an acute infarct. 4. Background parenchymal atrophy, chronic small vessel disease and small foci of chronic hemorrhage, as described within the body of the report. Electronically Signed   By: Bascom Lily D.O.   On: 03/08/2024 12:08   Overnight EEG with video Result Date: 03/07/2024 Arleene Lack, MD     03/08/2024  4:58 AM Patient Name: Chloe Wilcox MRN: [id number] Epilepsy Attending: Arleene Lack Referring Physician/Provider: Khaliqdina, Salman, MD Duration: 03/06/2024 2228 to 03/07/2024 2228 Patient history:  89 y.o. female with recent diagnosis of seizures in November 2024 and discharged on Keppra  500 mg twice daily.  She she was brought into the ED today after she had a prolonged seizure at her facility.  Her seizure resolved clinically after 7.5 mg of Versed  in the field, she had to be intubated after that for inability to protect her airway. EEG to evaluate for seizure Level of alertness: comatose AEDs during EEG study:  LEV, propofol  Technical aspects: This EEG study was done with scalp electrodes positioned according to the 10-20 International system of electrode placement. Electrical activity was reviewed with band pass filter of 1-70Hz , sensitivity of 7 uV/mm, display speed of 30mm/sec with a 60Hz  notched filter applied as appropriate. EEG data were recorded continuously and digitally stored.  Video monitoring was available and reviewed as appropriate. Description: EEG showed continuous generalized 3 to 6 Hz theta-delta slowing with overriding 13-15hz  beta activity.  Hyperventilation and photic stimulation were not performed.   Intermittently throughout the study, lateralized periodic discharges with overriding fast activity were noted in left hemisphere at 0.75 to 1 Hz, at times with overlying rhythmicity. Event button was pressed on 03/07/2024 at 0228 for LEFT upper extremity jerking and oxygen  desaturation.  Concomitant EEG before, during and after the event did not show any definite EEG change. Event button was pressed on 03/07/2024 at 1742 for RIGHT upper extremity tremor like movement. Concomitant EEG before, during and after the event did not show any definite EEG change. ABNORMALITY - Lateralized periodic discharges with overriding fast activity and rhythmicity, left hemisphere ( LPD+F+R) - Continuous slow, generalized IMPRESSION: This study showed evidence of epileptogenicity arising from left hemisphere.  This EEG pattern is on the ictal-interictal continuum with increased risk of seizure recurrence.  Additionally there is moderate diffuse encephalopathy, likely related to sedation Event button was pressed on 03/07/2024 at 0228 for LEFT upper extremity jerking and oxygen  desaturation without concomitant EEG change.  However focal motor seizures may not be seen on scalp EEG.  Clinical correlation is recommended. Event button was pressed on 03/07/2024 at 1742 for RIGHT upper extremity tremor like movement without concomitant  EEG change. This was most likely NOT an epileptic event. Priyanka  Suzanne Erps   CT Head Wo Contrast Result Date: 03/06/2024 CLINICAL DATA:  Ataxia, head trauma oeizures since 1900. LKW was 1800, pt was wheeling around facility. Upon ems arrival, jaw twitching, eyes deviated EXAM: CT HEAD WITHOUT CONTRAST TECHNIQUE: Contiguous axial images were obtained from the base of the skull through the vertex without intravenous contrast. RADIATION DOSE REDUCTION: This exam was performed according to the departmental dose-optimization program which includes automated exposure control, adjustment of the mA and/or kV according to patient size and/or use of iterative reconstruction technique. COMPARISON:  MRI head 10/12/2023 FINDINGS: Brain: Patchy and confluent areas of decreased attenuation are noted throughout the deep and periventricular white matter of the cerebral hemispheres bilaterally, compatible with chronic microvascular ischemic disease. No evidence of large-territorial acute infarction. No parenchymal hemorrhage. No mass lesion. No extra-axial collection. No mass effect or midline shift. No hydrocephalus. Basilar cisterns are patent. Vascular: No hyperdense vessel. Skull: No acute fracture or focal lesion. Sinuses/Orbits: Paranasal sinuses and mastoid air cells are clear. Bilateral lens replacement. Otherwise the orbits are unremarkable. Other: Endotracheal tube partially visualized. IMPRESSION: No acute intracranial abnormality. Electronically Signed   By: Morgane  Naveau M.D.   On: 03/06/2024 23:14   DG Chest 1 View Result Date: 03/06/2024 CLINICAL DATA:  Intubation. EXAM: CHEST  1 VIEW COMPARISON:  10/03/2023 FINDINGS: The endotracheal tube tip is 14 mm from the carina. Normal heart size with stable mediastinal contours. Mild atelectasis at the left lung base. No pleural fluid or pneumothorax. No pulmonary edema. IMPRESSION: 1. Endotracheal tube tip 14 mm from the carina. 2. Mild left basilar atelectasis.  Electronically Signed   By: Chadwick Colonel M.D.   On: 03/06/2024 22:21   DG Abd Portable 1V Result Date: 03/06/2024 CLINICAL DATA:  Orogastric tube placement. EXAM: PORTABLE ABDOMEN - 1 VIEW COMPARISON:  None Available. FINDINGS: Tip and side port of the enteric tube below the diaphragm in the stomach. Nonobstructive upper abdominal bowel gas pattern. IMPRESSION: Tip and side port of the enteric tube below the diaphragm in the stomach. Electronically Signed   By: Chadwick Colonel M.D.   On: 03/06/2024 22:19     Subjective: No acute issues or events overnight, appears comfortable, review of systems limited due to patient's somnolence   Discharge Exam: Vitals:   04/02/24 0755 04/02/24 0825  BP: 131/69   Pulse: 87   Resp:    Temp: 99.5 F (37.5 C)   SpO2: 94% 91%   Vitals:   04/01/24 2250 04/02/24 0036 04/02/24 0755 04/02/24 0825  BP: 126/64  131/69   Pulse: 89 84 87   Resp:      Temp: 98 F (36.7 C)  99.5 F (37.5 C)   TempSrc: Oral  Axillary   SpO2: (!) 89% (!) 88% 94% 91%  Weight:      Height:       General:  Pleasantly resting in bed, No acute distress. HEENT:  Normocephalic atraumatic.  Sclerae nonicteric, noninjected.  Extraocular movements intact bilaterally.  NG clean dry intact Neck:  Without mass or deformity.  Trachea is midline. Lungs:  Clear to auscultate bilaterally without rhonchi, wheeze, or rales. Heart:  Regular rate and rhythm. Abdomen:  Soft, nontender, nondistended. Extremities: Without cyanosis, clubbing, edema. Noted sacral decubitus at intake.  The results of significant diagnostics from this hospitalization (including imaging, microbiology, ancillary and laboratory) are listed below for reference.     Microbiology: No results found for this or any previous visit (from the past 240 hours).  Labs: asic Metabolic Panel: Recent Labs  Lab 03/27/24 0543 03/29/24 0705 04/01/24 0632  NA 133* 137 136  K 4.5 5.1 4.5  CL 96* 99 97*  CO2 30 29 29    GLUCOSE 125* 139* 111*  BUN 24* 24* 23  CREATININE 0.34* 0.37* 0.35*  CALCIUM  9.3 9.7 10.1   CBC: Recent Labs  Lab 03/27/24 0543 03/29/24 0705 04/01/24 0632  WBC 8.4 8.4 11.3*  NEUTROABS 6.5 6.2 9.6*  HGB 9.5* 10.5* 10.1*  HCT 30.1* 34.0* 32.8*  MCV 91.8 93.2 92.1  PLT 411* 371 445*   CBG: Recent Labs  Lab 03/31/24 1935 03/31/24 2306 04/01/24 0408 04/01/24 0825 04/01/24 1154  GLUCAP 148* 121* 118* 122* 110*   Urinalysis    Component Value Date/Time   COLORURINE YELLOW 03/06/2024 2035   APPEARANCEUR CLOUDY (A) 03/06/2024 2035   LABSPEC 1.015 03/06/2024 2035   PHURINE 6.0 03/06/2024 2035   GLUCOSEU NEGATIVE 03/06/2024 2035   HGBUR NEGATIVE 03/06/2024 2035   BILIRUBINUR NEGATIVE 03/06/2024 2035   KETONESUR NEGATIVE 03/06/2024 2035   PROTEINUR 100 (A) 03/06/2024 2035   NITRITE NEGATIVE 03/06/2024 2035   LEUKOCYTESUR LARGE (A) 03/06/2024 2035   Sepsis Labs Recent Labs  Lab 03/27/24 0543 03/29/24 0705 04/01/24 0632  WBC 8.4 8.4 11.3*   Microbiology No results found for this or any previous visit (from the past 240 hours).   Time coordinating discharge: Over 30 minutes  SIGNED:   Haydee Lipa, DO [facility] 04/02/2024, 12:56 PM Pager   If 7PM-7AM, please contact night-coverage [email or web address]

## 2024-04-02 NOTE — Progress Notes (Signed)
 Patient was discharged to [facility] via [facility] transport. Family in the and updated. Pain medication administered before leaving the hospital.

## 2024-04-02 NOTE — Plan of Care (Signed)
   Problem: Activity: Goal: Risk for activity intolerance will decrease Outcome: Progressing   Problem: Nutrition: Goal: Adequate nutrition will be maintained Outcome: Progressing   Problem: Coping: Goal: Level of anxiety will decrease Outcome: Progressing

## 2024-04-02 NOTE — Progress Notes (Addendum)
 Patient's granddaughter would like patients wounds pictures to be updated into Mychart. This RN was not able to update into my chart. Patient's granddaughter also stated that she spoke with a provider during the day time about having patient's wounds linked to patient's my chart.

## 2024-04-02 NOTE — TOC Transition Note (Signed)
 Transition of Care (TOC) - Discharge Note Sherin Dingwall RN,BSN Transitions of Care Unit 4NP (Non Trauma)- RN Case Manager See Treatment Team for direct Phone #   Patient Details  Name: Chloe Wilcox MRN: [id number] Date of Birth: Oct 17, 1935  Transition of Care [facility]) CM/SW Contact:  Rox Cope, RN Phone Number: 04/02/2024, 3:31 PM   Clinical Narrative:    Family has decided on Comfort Care, referral made by CSW on 5/5 to [facility] for [facility].   CM has been notified that pt has been accepted for Hospice needs and has bed available at [facility] for transition today.  Per [facility] liaison family has completed needed consents for Hospice admit to [facility].   GOLD DNR on chart, PTAR paperwork placed on chart as well.   [facility] called for transport at 1525- per dispatch- pt is 14th on the list at this time for transport. They will try to get pt earlier if able since she is a Hospice pt. No timeframe given.  Team updated.   Bedside RN has called report to [facility] - [phone]   No further TOC needs noted.    Final next level of care: Hospice Medical Facility Barriers to Discharge: Barriers Resolved   Patient Goals and CMS Choice Patient states their goals for this hospitalization and ongoing recovery are:: comfort care CMS Medicare.gov Compare Post Acute Care list provided to:: Patient Represenative (must comment) Choice offered to / list presented to : Adult Children      Discharge Placement               INPT Hospice at [facility] Additional resources added to the After Visit Summary for   In-house Referral: Clinical Social Work Discharge Planning Services: CM Consult Post Acute Care Choice: Hospice          DME Arranged: N/A DME Agency: NA       HH Arranged: NA HH Agency: [facility] Date [facility] Agency Contacted: 04/01/24   Representative spoke with at [facility] Agency:  Shawn  Social Drivers of Health (SDOH) Interventions SDOH Screenings   Food Insecurity: Patient Unable To Answer (03/07/2024)  Housing: Patient Unable To Answer (03/07/2024)  Transportation Needs: Patient Unable To Answer (03/07/2024)  Utilities: Patient Unable To Answer (03/07/2024)  Social Connections: Patient Unable To Answer (03/07/2024)  Tobacco Use: Medium Risk (03/06/2024)     Readmission Risk Interventions    04/02/2024    3:30 PM  Readmission Risk Prevention Plan  Transportation Screening Complete  Medication Review (RN Care Manager) Complete  HRI or Home Care Consult Complete  SW Recovery Care/Counseling Consult Complete  Palliative Care Screening Complete  Skilled Nursing Facility Not Applicable

## 2024-04-02 NOTE — Progress Notes (Addendum)
 Palliative Medicine Inpatient Follow Up Note HPI: 89 y.o. female  with past medical history of epilepsy, cognitive deficits, failure to thrive admitted on 03/06/2024 with twitching and right-sided gaze, less responsive.    EMS was called and administered IM Versed  without improvement before establishing IV access and administering an additional Versed  dose IV. There was roughly 45 minutes reported before seizures resolved. She was brought to [facility] ED where she required intubation for airway protection.  Extubated on 4/13 and currently still has ongoing seizure activity.   PMT has been consulted to assist with goals of care conversation in light of failure to thrive and chronic comorbidities.  Today's Discussion 04/02/2024  *Please note that this is a verbal dictation therefore any spelling or grammatical errors are due to the "Dragon Medical One" system interpretation.  Chart reviewed inclusive of vital signs, progress notes, laboratory results, and diagnostic images.   Received a message from patients RN about family concerns related to morphine administration.   I met with Chloe Wilcox this morning, she was resting in bed in NAD sleeping soundly.  I spoke with Chloe Wilcox's two nieces, granddaughter (on speaker phone), and sister. I was able to describe to patient and her family the role of morphine in end of life care patients. I shared the advantages outweigh the risks in instances such as hers to alleviate pain and suffering. The family ask that if IV morphine is needed if we can only give 1mg  at a time to observe for response which I shared we could. They do agree to the continued SL morphine TID.   Patients family assert their interest in [facility] for ongoing end of life care.   Questions and concerns addressed/Palliative Support Provided.   Objective Assessment: Vital Signs Vitals:   04/02/24 0755 04/02/24 0825  BP: 131/69   Pulse: 87   Resp:    Temp: 99.5 F (37.5 C)   SpO2: 94%  91%    Intake/Output Summary (Last 24 hours) at 04/02/2024 1212 Last data filed at 04/02/2024 0348 Gross per 24 hour  Intake 286 ml  Output 500 ml  Net -214 ml   Last Weight  Most recent update: 04/01/2024  4:43 AM    Weight  44.7 kg (98 lb 8.7 oz)            Gen: Elderly African-American female chronically ill in appearance HEENT: Core track in place, dry mucous membranes CV: Regular rate and rhythm PULM: On room air breathing is even and unlabored ABD: soft/nontender EXT: No edema Neuro: Yelling out,  agitated  SUMMARY OF RECOMMENDATIONS   Continue DNR Gold DNR placed on chart Transition to comfort focused care today Morphine TID and PRN for pain/air hunger/comfort Morphine 1mg  IVP PRN severe pain or dyspnea Robinul PRN for excessive secretions Ativan  PRN for agitation/anxiety Zofran  PRN for nausea Liquifilm tears PRN for dry eyes Haldol  PRN for agitation/anxiety May have comfort feeding Comfort cart for family Unrestricted visitations in the setting of EOL (per policy) Oxygen  PRN 2L or less for comfort. No escalation.   Psychosocial and emotional support provided Plan to transition to [facility] this afternoon  Billing based on MDM: High ______________________________________________________________________________________ [facility] Palliative Medicine Team Team Cell Phone: [phone] Please utilize secure chat with additional questions, if there is no response within 30 minutes please call the above phone number  Palliative Medicine Team providers are available by phone from 7am to 7pm daily and can be reached through the team cell phone.  Should this patient require assistance outside of these hours, please call the patient's attending physician.

## 2024-04-02 NOTE — Progress Notes (Signed)
 Nutrition Brief Note  Chart reviewed. Pt now transitioning to comfort care after GOC established that PEG tube placement would not be pursued. Cortrak pulled yesterday. Family in agreement to have PO diet for comfort with acceptance of aspiration risk. Patient currently on Dysphagia 1 diet with thin liquids. Will continue Ensures BID and Magic cups TID.  No further nutrition interventions planned at this time.  Please re-consult as needed.   Frederik Jansky, RD Registered Dietitian  See Amion for more information

## 2024-04-02 NOTE — Plan of Care (Signed)
  Problem: Pain Managment: Goal: General experience of comfort will improve and/or be controlled Outcome: Progressing   Problem: Safety: Goal: Ability to remain free from injury will improve Outcome: Progressing
